# Patient Record
Sex: Female | Born: 1935 | Race: White | Hispanic: No | Smoking: Never smoker
Health system: Southern US, Community
[De-identification: ages and names within clinical notes are randomized; demographics above are authoritative.]

## PROBLEM LIST (undated history)

## (undated) DIAGNOSIS — N189 Chronic kidney disease, unspecified: Secondary | ICD-10-CM

## (undated) DIAGNOSIS — K5730 Diverticulosis of large intestine without perforation or abscess without bleeding: Secondary | ICD-10-CM

## (undated) DIAGNOSIS — I82409 Acute embolism and thrombosis of unspecified deep veins of unspecified lower extremity: Secondary | ICD-10-CM

## (undated) DIAGNOSIS — I83009 Varicose veins of unspecified lower extremity with ulcer of unspecified site: Secondary | ICD-10-CM

## (undated) DIAGNOSIS — M159 Polyosteoarthritis, unspecified: Secondary | ICD-10-CM

## (undated) DIAGNOSIS — I10 Essential (primary) hypertension: Secondary | ICD-10-CM

## (undated) DIAGNOSIS — D649 Anemia, unspecified: Secondary | ICD-10-CM

## (undated) DIAGNOSIS — K219 Gastro-esophageal reflux disease without esophagitis: Secondary | ICD-10-CM

## (undated) DIAGNOSIS — L97909 Non-pressure chronic ulcer of unspecified part of unspecified lower leg with unspecified severity: Secondary | ICD-10-CM

## (undated) HISTORY — DX: Essential (primary) hypertension: I10

## (undated) HISTORY — PX: TOTAL KNEE ARTHROPLASTY: SHX125

## (undated) HISTORY — PX: ABDOMINAL HYSTERECTOMY: SHX81

## (undated) HISTORY — DX: Varicose veins of unspecified lower extremity with ulcer of unspecified site: I83.009

## (undated) HISTORY — DX: Anemia, unspecified: D64.9

## (undated) HISTORY — DX: Acute embolism and thrombosis of unspecified deep veins of unspecified lower extremity: I82.409

## (undated) HISTORY — DX: Polyosteoarthritis, unspecified: M15.9

## (undated) HISTORY — PX: PARTIAL HYSTERECTOMY: SHX80

## (undated) HISTORY — PX: HERNIA REPAIR: SHX51

## (undated) HISTORY — DX: Chronic kidney disease, unspecified: N18.9

## (undated) HISTORY — PX: CHOLECYSTECTOMY OPEN: SUR202

## (undated) HISTORY — DX: Gastro-esophageal reflux disease without esophagitis: K21.9

## (undated) HISTORY — PX: CATARACT EXTRACTION: SUR2

## (undated) HISTORY — DX: Non-pressure chronic ulcer of unspecified part of unspecified lower leg with unspecified severity: L97.909

---

## 1999-02-06 ENCOUNTER — Ambulatory Visit (HOSPITAL_COMMUNITY): Admission: RE | Admit: 1999-02-06 | Discharge: 1999-02-06 | Payer: Self-pay | Admitting: Internal Medicine

## 1999-07-06 ENCOUNTER — Encounter: Admission: RE | Admit: 1999-07-06 | Discharge: 1999-07-06 | Payer: Self-pay | Admitting: *Deleted

## 1999-08-28 ENCOUNTER — Encounter: Payer: Self-pay | Admitting: Orthopedic Surgery

## 1999-08-28 ENCOUNTER — Inpatient Hospital Stay (HOSPITAL_COMMUNITY): Admission: RE | Admit: 1999-08-28 | Discharge: 1999-09-01 | Payer: Self-pay | Admitting: Orthopedic Surgery

## 1999-08-30 ENCOUNTER — Encounter: Payer: Self-pay | Admitting: Orthopedic Surgery

## 1999-09-01 ENCOUNTER — Inpatient Hospital Stay (HOSPITAL_COMMUNITY)
Admission: RE | Admit: 1999-09-01 | Discharge: 1999-09-07 | Payer: Self-pay | Admitting: Physical Medicine & Rehabilitation

## 2003-02-17 ENCOUNTER — Encounter: Admission: RE | Admit: 2003-02-17 | Discharge: 2003-02-17 | Payer: Self-pay | Admitting: General Surgery

## 2003-02-17 ENCOUNTER — Encounter: Payer: Self-pay | Admitting: General Surgery

## 2004-07-14 ENCOUNTER — Encounter
Admission: RE | Admit: 2004-07-14 | Discharge: 2004-07-14 | Payer: Self-pay | Admitting: Physical Medicine and Rehabilitation

## 2004-07-27 ENCOUNTER — Ambulatory Visit: Payer: Self-pay

## 2004-08-07 ENCOUNTER — Ambulatory Visit: Payer: Self-pay | Admitting: Internal Medicine

## 2004-11-07 ENCOUNTER — Ambulatory Visit: Payer: Self-pay | Admitting: Internal Medicine

## 2005-01-19 ENCOUNTER — Encounter
Admission: RE | Admit: 2005-01-19 | Discharge: 2005-01-19 | Payer: Self-pay | Admitting: Physical Medicine and Rehabilitation

## 2005-02-05 ENCOUNTER — Ambulatory Visit: Payer: Self-pay | Admitting: Internal Medicine

## 2005-04-30 ENCOUNTER — Emergency Department (HOSPITAL_COMMUNITY): Admission: EM | Admit: 2005-04-30 | Discharge: 2005-04-30 | Payer: Self-pay | Admitting: Emergency Medicine

## 2005-05-08 ENCOUNTER — Ambulatory Visit: Payer: Self-pay | Admitting: Internal Medicine

## 2005-08-01 ENCOUNTER — Ambulatory Visit (HOSPITAL_COMMUNITY): Admission: RE | Admit: 2005-08-01 | Discharge: 2005-08-01 | Payer: Self-pay | Admitting: Orthopedic Surgery

## 2005-08-31 ENCOUNTER — Ambulatory Visit: Payer: Self-pay | Admitting: Internal Medicine

## 2005-09-21 ENCOUNTER — Ambulatory Visit: Payer: Self-pay | Admitting: Internal Medicine

## 2005-09-28 ENCOUNTER — Ambulatory Visit: Payer: Self-pay | Admitting: Internal Medicine

## 2005-11-12 ENCOUNTER — Ambulatory Visit: Payer: Self-pay | Admitting: Internal Medicine

## 2005-12-10 ENCOUNTER — Ambulatory Visit: Payer: Self-pay | Admitting: Internal Medicine

## 2006-03-18 ENCOUNTER — Ambulatory Visit: Payer: Self-pay | Admitting: Internal Medicine

## 2006-03-19 ENCOUNTER — Ambulatory Visit: Payer: Self-pay

## 2006-03-28 ENCOUNTER — Encounter (HOSPITAL_BASED_OUTPATIENT_CLINIC_OR_DEPARTMENT_OTHER): Admission: RE | Admit: 2006-03-28 | Discharge: 2006-06-26 | Payer: Self-pay | Admitting: Surgery

## 2006-06-18 ENCOUNTER — Ambulatory Visit: Payer: Self-pay | Admitting: Internal Medicine

## 2006-07-31 ENCOUNTER — Ambulatory Visit: Payer: Self-pay | Admitting: Internal Medicine

## 2006-10-02 ENCOUNTER — Ambulatory Visit: Payer: Self-pay | Admitting: Internal Medicine

## 2006-10-02 LAB — CONVERTED CEMR LAB
ALT: 11 units/L (ref 0–40)
AST: 21 units/L (ref 0–37)
Albumin: 3.8 g/dL (ref 3.5–5.2)
Alkaline Phosphatase: 103 units/L (ref 39–117)
Creatinine, Ser: 1.2 mg/dL (ref 0.4–1.2)
GFR calc non Af Amer: 45 mL/min
Glucose, Bld: 89 mg/dL (ref 70–99)
Hemoglobin: 13 g/dL (ref 12.0–15.0)
Sodium: 144 meq/L (ref 135–145)
Total Bilirubin: 1.3 mg/dL — ABNORMAL HIGH (ref 0.3–1.2)

## 2007-01-28 ENCOUNTER — Ambulatory Visit: Payer: Self-pay | Admitting: Internal Medicine

## 2007-04-24 DIAGNOSIS — K219 Gastro-esophageal reflux disease without esophagitis: Secondary | ICD-10-CM

## 2007-04-24 DIAGNOSIS — I10 Essential (primary) hypertension: Secondary | ICD-10-CM | POA: Insufficient documentation

## 2007-04-24 HISTORY — DX: Gastro-esophageal reflux disease without esophagitis: K21.9

## 2007-04-24 HISTORY — DX: Essential (primary) hypertension: I10

## 2007-05-06 ENCOUNTER — Ambulatory Visit: Payer: Self-pay | Admitting: Internal Medicine

## 2007-05-06 DIAGNOSIS — M159 Polyosteoarthritis, unspecified: Secondary | ICD-10-CM

## 2007-05-06 DIAGNOSIS — M1990 Unspecified osteoarthritis, unspecified site: Secondary | ICD-10-CM | POA: Insufficient documentation

## 2007-05-06 HISTORY — DX: Polyosteoarthritis, unspecified: M15.9

## 2007-05-13 ENCOUNTER — Encounter: Payer: Self-pay | Admitting: Internal Medicine

## 2007-08-05 ENCOUNTER — Ambulatory Visit: Payer: Self-pay | Admitting: Internal Medicine

## 2007-09-09 ENCOUNTER — Emergency Department (HOSPITAL_COMMUNITY): Admission: EM | Admit: 2007-09-09 | Discharge: 2007-09-09 | Payer: Self-pay | Admitting: *Deleted

## 2007-12-09 ENCOUNTER — Emergency Department (HOSPITAL_COMMUNITY): Admission: EM | Admit: 2007-12-09 | Discharge: 2007-12-09 | Payer: Self-pay | Admitting: Emergency Medicine

## 2007-12-14 ENCOUNTER — Encounter: Payer: Self-pay | Admitting: Internal Medicine

## 2007-12-15 ENCOUNTER — Ambulatory Visit: Payer: Self-pay | Admitting: Internal Medicine

## 2007-12-15 LAB — CONVERTED CEMR LAB
ALT: 11 units/L (ref 0–35)
Albumin: 3.6 g/dL (ref 3.5–5.2)
BUN: 31 mg/dL — ABNORMAL HIGH (ref 6–23)
Basophils Relative: 1 % (ref 0.0–1.0)
Bilirubin, Direct: 0.2 mg/dL (ref 0.0–0.3)
CO2: 29 meq/L (ref 19–32)
Creatinine, Ser: 1.3 mg/dL — ABNORMAL HIGH (ref 0.4–1.2)
Eosinophils Absolute: 0.3 10*3/uL (ref 0.0–0.6)
GFR calc Af Amer: 50 mL/min
GFR calc non Af Amer: 41 mL/min
HCT: 37.9 % (ref 36.0–46.0)
HDL: 39.6 mg/dL (ref >39.0)
Lymphocytes Relative: 25.7 % (ref 12.0–46.0)
MCHC: 32.4 g/dL (ref 30.0–36.0)
MCV: 92.8 fL (ref 78.0–100.0)
Monocytes Absolute: 0.5 10*3/uL (ref 0.2–0.7)
Monocytes Relative: 9 % (ref 3.0–11.0)
RBC: 4.08 M/uL (ref 3.87–5.11)
RDW: 13.8 % (ref 11.5–14.6)
Sodium: 144 meq/L (ref 135–145)
Total Protein: 6.2 g/dL (ref 6.0–8.3)
Triglycerides: 153 mg/dL — ABNORMAL HIGH (ref 0–149)
VLDL: 31 mg/dL (ref 0–40)
WBC: 5.2 10*3/uL (ref 4.5–10.5)

## 2007-12-23 ENCOUNTER — Telehealth: Payer: Self-pay | Admitting: Internal Medicine

## 2008-01-14 ENCOUNTER — Encounter: Payer: Self-pay | Admitting: Internal Medicine

## 2008-04-20 ENCOUNTER — Ambulatory Visit: Payer: Self-pay | Admitting: Internal Medicine

## 2008-06-08 ENCOUNTER — Encounter: Payer: Self-pay | Admitting: Internal Medicine

## 2008-06-24 ENCOUNTER — Ambulatory Visit: Payer: Self-pay | Admitting: Internal Medicine

## 2008-07-20 ENCOUNTER — Ambulatory Visit: Payer: Self-pay | Admitting: Internal Medicine

## 2008-07-20 DIAGNOSIS — I83009 Varicose veins of unspecified lower extremity with ulcer of unspecified site: Secondary | ICD-10-CM

## 2008-07-20 DIAGNOSIS — L97909 Non-pressure chronic ulcer of unspecified part of unspecified lower leg with unspecified severity: Secondary | ICD-10-CM

## 2008-07-20 HISTORY — DX: Varicose veins of unspecified lower extremity with ulcer of unspecified site: I83.009

## 2008-09-14 ENCOUNTER — Telehealth: Payer: Self-pay | Admitting: Internal Medicine

## 2008-11-16 ENCOUNTER — Ambulatory Visit: Payer: Self-pay | Admitting: Internal Medicine

## 2009-03-15 ENCOUNTER — Ambulatory Visit: Payer: Self-pay | Admitting: Internal Medicine

## 2009-03-15 DIAGNOSIS — R634 Abnormal weight loss: Secondary | ICD-10-CM

## 2009-03-17 LAB — CONVERTED CEMR LAB
AST: 17 units/L (ref 0–37)
Alkaline Phosphatase: 75 units/L (ref 39–117)
Basophils Absolute: 0.1 10*3/uL (ref 0.0–0.1)
Bilirubin, Direct: 0 mg/dL (ref 0.0–0.3)
CO2: 27 meq/L (ref 19–32)
Cholesterol: 161 mg/dL (ref 0–200)
Eosinophils Absolute: 0.4 10*3/uL (ref 0.0–0.7)
Eosinophils Relative: 7.9 % — ABNORMAL HIGH (ref 0.0–5.0)
HCT: 30 % — ABNORMAL LOW (ref 36.0–46.0)
HDL: 41.8 mg/dL (ref >39.00)
Hemoglobin: 10 g/dL — ABNORMAL LOW (ref 12.0–15.0)
LDL Cholesterol: 89 mg/dL (ref 0–99)
Lymphs Abs: 1.3 10*3/uL (ref 0.7–4.0)
MCHC: 33.3 g/dL (ref 30.0–36.0)
Monocytes Absolute: 0.4 10*3/uL (ref 0.1–1.0)
Monocytes Relative: 8 % (ref 3.0–12.0)
Neutro Abs: 3.2 10*3/uL (ref 1.4–7.7)
Neutrophils Relative %: 59.9 % (ref 43.0–77.0)
Platelets: 247 10*3/uL (ref 150.0–400.0)
Potassium: 4.8 meq/L (ref 3.5–5.1)
RBC: 3.57 M/uL — ABNORMAL LOW (ref 3.87–5.11)
Sodium: 147 meq/L — ABNORMAL HIGH (ref 135–145)
TSH: 0.78 microintl units/mL (ref 0.35–5.50)
Total Bilirubin: 1 mg/dL (ref 0.3–1.2)
Total Protein: 6.5 g/dL (ref 6.0–8.3)
VLDL: 30 mg/dL (ref 0.0–40.0)

## 2009-03-30 ENCOUNTER — Encounter (HOSPITAL_BASED_OUTPATIENT_CLINIC_OR_DEPARTMENT_OTHER): Admission: RE | Admit: 2009-03-30 | Discharge: 2009-06-01 | Payer: Self-pay | Admitting: Internal Medicine

## 2009-03-31 ENCOUNTER — Ambulatory Visit: Payer: Self-pay | Admitting: Internal Medicine

## 2009-03-31 DIAGNOSIS — D649 Anemia, unspecified: Secondary | ICD-10-CM

## 2009-03-31 HISTORY — DX: Anemia, unspecified: D64.9

## 2009-03-31 LAB — CONVERTED CEMR LAB
Eosinophils Relative: 7.7 % — ABNORMAL HIGH (ref 0.0–5.0)
Folate: >20 ng/mL
Hemoglobin: 10.4 g/dL — ABNORMAL LOW (ref 12.0–15.0)
Lymphocytes Relative: 20.9 % (ref 12.0–46.0)
Lymphs Abs: 1.2 10*3/uL (ref 0.7–4.0)
MCHC: 33 g/dL (ref 30.0–36.0)
MCV: 84.4 fL (ref 78.0–100.0)
Neutrophils Relative %: 61.2 % (ref 43.0–77.0)
WBC: 5.7 10*3/uL (ref 4.5–10.5)

## 2009-04-05 ENCOUNTER — Encounter: Payer: Self-pay | Admitting: Internal Medicine

## 2009-04-13 ENCOUNTER — Telehealth (INDEPENDENT_AMBULATORY_CARE_PROVIDER_SITE_OTHER): Payer: Self-pay

## 2009-04-27 ENCOUNTER — Encounter: Payer: Self-pay | Admitting: Internal Medicine

## 2009-04-27 ENCOUNTER — Ambulatory Visit: Payer: Self-pay | Admitting: Vascular Surgery

## 2009-04-28 ENCOUNTER — Ambulatory Visit: Payer: Self-pay | Admitting: Internal Medicine

## 2009-05-11 ENCOUNTER — Encounter: Payer: Self-pay | Admitting: Internal Medicine

## 2009-05-11 ENCOUNTER — Ambulatory Visit: Payer: Self-pay | Admitting: Vascular Surgery

## 2009-05-18 ENCOUNTER — Ambulatory Visit: Payer: Self-pay | Admitting: Vascular Surgery

## 2009-07-01 ENCOUNTER — Encounter: Payer: Self-pay | Admitting: Internal Medicine

## 2009-07-01 ENCOUNTER — Ambulatory Visit: Payer: Self-pay | Admitting: Vascular Surgery

## 2009-07-13 ENCOUNTER — Ambulatory Visit: Payer: Self-pay | Admitting: Internal Medicine

## 2009-07-29 ENCOUNTER — Ambulatory Visit: Payer: Self-pay | Admitting: Internal Medicine

## 2009-10-27 ENCOUNTER — Encounter: Payer: Self-pay | Admitting: Internal Medicine

## 2009-10-28 ENCOUNTER — Ambulatory Visit: Payer: Self-pay | Admitting: Internal Medicine

## 2009-10-31 ENCOUNTER — Encounter: Payer: Self-pay | Admitting: Internal Medicine

## 2009-11-07 ENCOUNTER — Encounter: Payer: Self-pay | Admitting: Internal Medicine

## 2010-04-11 ENCOUNTER — Ambulatory Visit: Payer: Self-pay | Admitting: Internal Medicine

## 2010-04-13 LAB — CONVERTED CEMR LAB
ALT: 10 units/L (ref 0–35)
AST: 15 units/L (ref 0–37)
Albumin: 3.6 g/dL (ref 3.5–5.2)
Alkaline Phosphatase: 69 units/L (ref 39–117)
CO2: 28 meq/L (ref 19–32)
Chloride: 112 meq/L (ref 96–112)
Eosinophils Absolute: 0.2 10*3/uL (ref 0.0–0.7)
GFR calc non Af Amer: 46.24 mL/min (ref >60)
Glucose, Bld: 89 mg/dL (ref 70–99)
HCT: 25.8 % — ABNORMAL LOW (ref 36.0–46.0)
Lymphocytes Relative: 21.4 % (ref 12.0–46.0)
Lymphs Abs: 1 10*3/uL (ref 0.7–4.0)
Monocytes Absolute: 0.5 10*3/uL (ref 0.1–1.0)
Monocytes Relative: 9.6 % (ref 3.0–12.0)
Neutro Abs: 3 10*3/uL (ref 1.4–7.7)
Neutrophils Relative %: 63.1 % (ref 43.0–77.0)
Platelets: 284 10*3/uL (ref 150.0–400.0)
Potassium: 5 meq/L (ref 3.5–5.1)
Total Bilirubin: 0.6 mg/dL (ref 0.3–1.2)

## 2010-04-19 ENCOUNTER — Ambulatory Visit: Payer: Self-pay | Admitting: Internal Medicine

## 2010-04-19 LAB — CONVERTED CEMR LAB
Basophils Absolute: 0.1 10*3/uL (ref 0.0–0.1)
Basophils Relative: 1.2 % (ref 0.0–3.0)
Eosinophils Relative: 5.2 % — ABNORMAL HIGH (ref 0.0–5.0)
Ferritin: 10 ng/mL (ref 10.0–291.0)
HCT: 25.6 % — ABNORMAL LOW (ref 36.0–46.0)
Iron: 20 ug/dL — ABNORMAL LOW (ref 42–145)
Lymphocytes Relative: 25.5 % (ref 12.0–46.0)
Lymphs Abs: 1.5 10*3/uL (ref 0.7–4.0)
MCHC: 32.8 g/dL (ref 30.0–36.0)
Monocytes Absolute: 0.5 10*3/uL (ref 0.1–1.0)
Monocytes Relative: 9.2 % (ref 3.0–12.0)
OCCULT 1: NEGATIVE
Platelets: 281 10*3/uL (ref 150.0–400.0)
RBC: 3.33 M/uL — ABNORMAL LOW (ref 3.87–5.11)
Transferrin: 270.1 mg/dL (ref 212.0–360.0)

## 2010-04-20 ENCOUNTER — Inpatient Hospital Stay (HOSPITAL_COMMUNITY): Admission: EM | Admit: 2010-04-20 | Discharge: 2010-05-01 | Payer: Self-pay | Admitting: Emergency Medicine

## 2010-04-20 ENCOUNTER — Ambulatory Visit: Payer: Self-pay | Admitting: Internal Medicine

## 2010-04-20 ENCOUNTER — Telehealth: Payer: Self-pay | Admitting: Internal Medicine

## 2010-04-21 ENCOUNTER — Encounter: Payer: Self-pay | Admitting: Internal Medicine

## 2010-04-24 ENCOUNTER — Encounter (INDEPENDENT_AMBULATORY_CARE_PROVIDER_SITE_OTHER): Payer: Self-pay | Admitting: Internal Medicine

## 2010-04-24 ENCOUNTER — Encounter: Payer: Self-pay | Admitting: Internal Medicine

## 2010-04-24 LAB — HM COLONOSCOPY

## 2010-04-26 ENCOUNTER — Ambulatory Visit: Payer: Self-pay | Admitting: Vascular Surgery

## 2010-04-26 ENCOUNTER — Encounter (INDEPENDENT_AMBULATORY_CARE_PROVIDER_SITE_OTHER): Payer: Self-pay | Admitting: Internal Medicine

## 2010-04-28 ENCOUNTER — Encounter (INDEPENDENT_AMBULATORY_CARE_PROVIDER_SITE_OTHER): Payer: Self-pay | Admitting: Internal Medicine

## 2010-06-12 ENCOUNTER — Telehealth: Payer: Self-pay

## 2010-06-21 ENCOUNTER — Encounter: Payer: Self-pay | Admitting: Internal Medicine

## 2010-06-21 ENCOUNTER — Telehealth: Payer: Self-pay | Admitting: Internal Medicine

## 2010-06-22 ENCOUNTER — Encounter: Payer: Self-pay | Admitting: Internal Medicine

## 2010-06-26 ENCOUNTER — Ambulatory Visit: Payer: Self-pay | Admitting: Internal Medicine

## 2010-06-26 LAB — CONVERTED CEMR LAB
Basophils Absolute: 0 10*3/uL (ref 0.0–0.1)
Basophils Relative: 0.9 % (ref 0.0–3.0)
HCT: 37.4 % (ref 36.0–46.0)
Lymphocytes Relative: 29.3 % (ref 12.0–46.0)
MCV: 86.2 fL (ref 78.0–100.0)
Monocytes Relative: 9.2 % (ref 3.0–12.0)
Neutro Abs: 3.1 10*3/uL (ref 1.4–7.7)
Neutrophils Relative %: 55.2 % (ref 43.0–77.0)
Platelets: 232 10*3/uL (ref 150.0–400.0)

## 2010-07-03 ENCOUNTER — Telehealth: Payer: Self-pay | Admitting: Internal Medicine

## 2010-07-26 ENCOUNTER — Encounter: Payer: Self-pay | Admitting: Internal Medicine

## 2010-10-10 ENCOUNTER — Ambulatory Visit
Admission: RE | Admit: 2010-10-10 | Discharge: 2010-10-10 | Payer: Self-pay | Source: Home / Self Care | Attending: Internal Medicine | Admitting: Internal Medicine

## 2010-10-14 ENCOUNTER — Encounter: Payer: Self-pay | Admitting: Orthopedic Surgery

## 2010-10-16 ENCOUNTER — Encounter: Payer: Self-pay | Admitting: Internal Medicine

## 2010-10-26 NOTE — Assessment & Plan Note (Signed)
Summary: 3 month rov/njr   Vital Signs:  Patient profile:   77 year old female Weight:      125 pounds Temp:     98.5 degrees F oral BP sitting:   160 / 70  (left arm) Cuff size:   regular CC: 3mos rov , no c/o   CC:  3mos rov  and no c/o.  History of Present Illness:  77 year old patient who is seen today for follow-up.  She has hypertension, osteoarthritis, gastroesophageal reflux disease.  She is doing remarkably well.  No concerns or complaints today.has had a history of weight loss, which has been stable.  She also has a history of venous insufficiency and stasis ulcer disease, which has been stable.  She has been placed on a Metrazol for gastroesophageal disease and she is quite pleased with the results.  Allergies: 1)  ! Codeine 2)  ! Ibuprofen 3)  ! Cardizem  Past History:  Past Medical History: Reviewed history from 03/31/2009 and no changes required. GERD Hypertension DJD Peripheral Vascular Occlusive dz history of DVT and pulmonary embolism, 1998 carpal tunnel syndrome unsteady gait chronic venous insufficiency weight loss anemia chronic kidney disease  Review of Systems  The patient denies anorexia, fever, weight loss, weight gain, vision loss, decreased hearing, hoarseness, chest pain, syncope, dyspnea on exertion, peripheral edema, prolonged cough, headaches, hemoptysis, abdominal pain, melena, hematochezia, severe indigestion/heartburn, hematuria, incontinence, genital sores, muscle weakness, suspicious skin lesions, transient blindness, difficulty walking, depression, unusual weight change, abnormal bleeding, enlarged lymph nodes, angioedema, and breast masses.    Physical Exam  General:  elderly frail, alert, no distress.  Blood pressure 150/70 Head:  Normocephalic and atraumatic without obvious abnormalities. No apparent alopecia or balding. Mouth:  Oral mucosa and oropharynx without lesions or exudates.  Neck:  No deformities, masses, or tenderness  noted. Lungs:  Normal respiratory effort, chest expands symmetrically. Lungs are clear to auscultation, no crackles or wheezes. Heart:  Normal rate and regular rhythm. S1 and S2 normal without gallop, murmur, click, rub or other extra sounds. Abdomen:  Bowel sounds positive,abdomen soft and non-tender without masses, organomegaly or hernias noted. Extremities:  No clubbing, cyanosis, edema, or deformity noted with normal full range of motion of all joints.     Impression & Recommendations:  Problem # 1:  HYPERTENSION (ICD-401.9)  Her updated medication list for this problem includes:    Lasix 40 Mg Tabs (Furosemide) .Marland Kitchen... Take 1 tablet by mouth once a day    Accupril 40 Mg Tabs (Quinapril hcl) .Marland Kitchen... Take 1 tablet by mouth once a day  Problem # 2:  DEGENERATIVE JOINT DISEASE, GENERALIZED (ICD-715.00)  Her updated medication list for this problem includes:    Darvocet-n 100 100-650 Mg Tabs (Propoxyphene n-apap) .Marland Kitchen... As needed    Adult Aspirin Ec Low Strength 81 Mg Tbec (Aspirin) .Marland Kitchen... 1 once daily    Tramadol Hcl 50 Mg Tabs (Tramadol hcl) ..... One every 6 hours prn  Complete Medication List: 1)  Lasix 40 Mg Tabs (Furosemide) .... Take 1 tablet by mouth once a day 2)  Accupril 40 Mg Tabs (Quinapril hcl) .... Take 1 tablet by mouth once a day 3)  Vitamin E 400 Unit Caps (Vitamin e) .... Take 1 tablet by mouth once a day 4)  Vitamin B-12 1000 Mcg Tabs (Cyanocobalamin) .... Once daily 5)  Darvocet-n 100 100-650 Mg Tabs (Propoxyphene n-apap) .... As needed 6)  Meclizine Hcl 25 Mg Tabs (Meclizine hcl) .Marland Kitchen.. 1 q6h as needed 7)  Adult Aspirin Ec Low Strength 81 Mg Tbec (Aspirin) .Marland Kitchen.. 1 once daily 8)  Potassium Chloride Crys Cr 20 Meq Cr-tabs (Potassium chloride crys cr) .... One daily 9)  Feosol 200 (65 Fe) Mg Tabs (Ferrous sulfate dried) .Marland Kitchen.. 1 once daily 10)  Tramadol Hcl 50 Mg Tabs (Tramadol hcl) .... One every 6 hours prn 11)  Omeprazole 40 Mg Cpdr (Omeprazole) .... One daily  Patient  Instructions: 1)  Please schedule a follow-up appointment in 6 months. 2)  Limit your Sodium (Salt). 3)  Avoid foods high in acid (tomatoes, citrus juices, spicy foods). Avoid eating within two hours of lying down or before exercising. Do not over eat; try smaller more frequent meals. Elevate head of bed twelve inches when sleeping.

## 2010-10-26 NOTE — Assessment & Plan Note (Signed)
Summary: 6 MONTH ROV/NJR    Vital Signs:  Patient profile:   77 year old female Height:      59.75 inches Weight:      114 pounds Temp:     98.5 degrees F oral BP sitting:   164 / 84  (left arm) Cuff size:   regular  Vitals Entered By: Sid Falcon LPN (October 10, 2010 10:06 AM)  History of Present Illness: 77 year old patient who is seen today for her 6 month follow-up.  He does remarkably well.  She does have chronic constipation and is on the MiraLax daily.  She has hypertension, DJD, and chronic venous insufficiency.  She has osteoarthritis.  She has been stable.  Remains on omeprazole for gastroesophageal reflux disease.  She denies any cardiopulmonary complaints.  Does request a placard for permanent disability.  Allergies: 1)  ! Codeine 2)  ! Ibuprofen 3)  ! Cardizem  Past History:  Past Medical History: Reviewed history from 03/31/2009 and no changes required. GERD Hypertension DJD Peripheral Vascular Occlusive dz history of DVT and pulmonary embolism, 1998 carpal tunnel syndrome unsteady gait chronic venous insufficiency weight loss anemia chronic kidney disease  Past Surgical History: Reviewed history from 04/11/2010 and no changes required. Carpal tunnel release Cataract extraction Hysterectomy complete Total knee replacement x 2 gravida 3, para two, abortus one colonoscopy in February 2003 negative cryolite stress test November 2005 Inguinal herniorrhaphy  Family History: Reviewed history from 04/11/2010 and no changes required. Family History Other cancer-Colon, Breast, Prostate Family History of Stroke M 1st degree relative <50 Fam hx MI Fam hx CAD 4 brothers 3 sisters  Social History: Reviewed history from 12/15/2007 and no changes required. Never Smoked Retired  Review of Systems       The patient complains of weight loss, muscle weakness, and difficulty walking.  The patient denies anorexia, fever, weight gain, vision loss,  decreased hearing, hoarseness, chest pain, syncope, dyspnea on exertion, peripheral edema, prolonged cough, headaches, hemoptysis, abdominal pain, melena, hematochezia, severe indigestion/heartburn, hematuria, incontinence, genital sores, suspicious skin lesions, transient blindness, depression, unusual weight change, abnormal bleeding, enlarged lymph nodes, angioedema, and breast masses.         weight has been stable recently  Physical Exam  General:  elderly frail, alert, no distress.  Repeat blood pressure 140/80 Head:  Normocephalic and atraumatic without obvious abnormalities. No apparent alopecia or balding. Eyes:  No corneal or conjunctival inflammation noted. EOMI. Perrla. Funduscopic exam benign, without hemorrhages, exudates or papilledema. Vision grossly normal. Mouth:  Oral mucosa and oropharynx without lesions or exudates.   Neck:  No deformities, masses, or tenderness noted. Chest Wall:  marked kyphoscoliosis Lungs:  Normal respiratory effort, chest expands symmetrically. Lungs are clear to auscultation, no crackles or wheezes. Heart:  grade 3/6 systolic murmur Abdomen:  Bowel sounds positive,abdomen soft and non-tender without masses, organomegaly or hernias noted. Msk:  No deformity or scoliosis noted of thoracic or lumbar spine.   Extremities:  No clubbing, cyanosis, edema, or deformity noted with normal full range of motion of all joints.   Skin:  Intact without suspicious lesions or rashes Cervical Nodes:  No lymphadenopathy noted Psych:  Cognition and judgment appear intact. Alert and cooperative with normal attention span and concentration. No apparent delusions, illusions, hallucinations   Impression & Recommendations:  Problem # 1:  WEIGHT LOSS, RECENT (ICD-783.21) weight presently is stable and unchanged  Problem # 2:  DEGENERATIVE JOINT DISEASE, GENERALIZED (ICD-715.00)  Her updated medication list for this problem includes:  Adult Aspirin Ec Low Strength 81  Mg Tbec (Aspirin) .Marland Kitchen... 1 once daily    Tramadol Hcl 50 Mg Tabs (Tramadol hcl) ..... One every 6 hours prn will continue same.  Permanent handicap placard completed  Her updated medication list for this problem includes:    Adult Aspirin Ec Low Strength 81 Mg Tbec (Aspirin) .Marland Kitchen... 1 once daily    Tramadol Hcl 50 Mg Tabs (Tramadol hcl) ..... One every 6 hours prn  Problem # 3:  HYPERTENSION (ICD-401.9)  Her updated medication list for this problem includes:    Lasix 40 Mg Tabs (Furosemide) .Marland Kitchen... Take 1 tablet by mouth once a day    Accupril 40 Mg Tabs (Quinapril hcl) .Marland Kitchen... Take 1 tablet by mouth once a day  Her updated medication list for this problem includes:    Lasix 40 Mg Tabs (Furosemide) .Marland Kitchen... Take 1 tablet by mouth once a day    Accupril 40 Mg Tabs (Quinapril hcl) .Marland Kitchen... Take 1 tablet by mouth once a day  Problem # 4:  GERD (ICD-530.81)  Her updated medication list for this problem includes:    Omeprazole 40 Mg Cpdr (Omeprazole) ..... One daily  Her updated medication list for this problem includes:    Omeprazole 40 Mg Cpdr (Omeprazole) ..... One daily  Complete Medication List: 1)  Lasix 40 Mg Tabs (Furosemide) .... Take 1 tablet by mouth once a day 2)  Accupril 40 Mg Tabs (Quinapril hcl) .... Take 1 tablet by mouth once a day 3)  Vitamin E 400 Unit Caps (Vitamin e) .... Take 1 tablet by mouth once a day 4)  Vitamin B-12 1000 Mcg Tabs (Cyanocobalamin) .... Once daily 5)  Meclizine Hcl 25 Mg Tabs (Meclizine hcl) .Marland Kitchen.. 1 q6h as needed 6)  Adult Aspirin Ec Low Strength 81 Mg Tbec (Aspirin) .Marland Kitchen.. 1 once daily 7)  Potassium Chloride Crys Cr 20 Meq Cr-tabs (Potassium chloride crys cr) .... One daily 8)  Tramadol Hcl 50 Mg Tabs (Tramadol hcl) .... One every 6 hours prn 9)  Omeprazole 40 Mg Cpdr (Omeprazole) .... One daily 10)  Dulcolax 5 Mg Tbec (Bisacodyl) .... Qd 11)  Miralax Powd (Polyethylene glycol 3350) .... Bid 12)  Nu-iron 150 Mg Caps (Polysaccharide iron complex) ....  Qd 13)  Polyethylene Glycol 3350-grx Powd (Polyethylene glycol 3350) .... Used twice daily  Patient Instructions: 1)  Please schedule a follow-up appointment in 6 months. 2)  Limit your Sodium (Salt). 3)  It is important that you exercise regularly at least 20 minutes 5 times a week. If you develop chest pain, have severe difficulty breathing, or feel very tired , stop exercising immediately and seek medical attention. 4)  Take calcium +Vitamin D daily. Prescriptions: MIRALAX  POWD (POLYETHYLENE GLYCOL 3350) bid  #carton x 6   Entered and Authorized by:   Gordy Savers  MD   Signed by:   Gordy Savers  MD on 10/10/2010   Method used:   Print then Give to Patient   RxID:   [id number] OMEPRAZOLE 40 MG CPDR (OMEPRAZOLE) one daily  #90 x 6   Entered and Authorized by:   Gordy Savers  MD   Signed by:   Gordy Savers  MD on 10/10/2010   Method used:   Print then Give to Patient   RxID:   [id number] TRAMADOL HCL 50 MG TABS (TRAMADOL HCL) one every 6 hours prn  #90 x 6   Entered and Authorized by:   Gordy Savers  MD   Signed by:   Gordy Savers  MD on 10/10/2010   Method used:   Print then Give to Patient   RxID:   [id number] POTASSIUM CHLORIDE CRYS CR 20 MEQ CR-TABS (POTASSIUM CHLORIDE CRYS CR) one daily  #90 x 6   Entered and Authorized by:   Gordy Savers  MD   Signed by:   Gordy Savers  MD on 10/10/2010   Method used:   Print then Give to Patient   RxID:   [id number] ACCUPRIL 40 MG  TABS (QUINAPRIL HCL) Take 1 tablet by mouth once a day  #90 x 6   Entered and Authorized by:   Gordy Savers  MD   Signed by:   Gordy Savers  MD on 10/10/2010   Method used:   Print then Give to Patient   RxID:   [id number] MECLIZINE HCL 25 MG  TABS (MECLIZINE HCL) 1 q6h as needed  #90 x 6   Entered and Authorized by:   Gordy Savers  MD   Signed by:   Gordy Savers  MD on 10/10/2010   Method used:    Print then Give to Patient   RxID:   [id number] LASIX 40 MG  TABS (FUROSEMIDE) Take 1 tablet by mouth once a day  #90 x 6   Entered and Authorized by:   Gordy Savers  MD   Signed by:   Gordy Savers  MD on 10/10/2010   Method used:   Print then Give to Patient   RxID:   [id number] MECLIZINE HCL 25 MG  TABS (MECLIZINE HCL) 1 q6h as needed  #90 x 6   Entered and Authorized by:   Gordy Savers  MD   Signed by:   Gordy Savers  MD on 10/10/2010   Method used:   Electronically to        [facility] [phone]* (retail)       [address] [place]  [address]       Ph: [phone] or [phone]       Fax: [phone]   RxID:   [id number]    Orders Added: 1)  Est. Patient Level IV [27035]

## 2010-10-26 NOTE — Procedures (Signed)
Summary: Colonoscopy  Patient: Erin Gibson Note: All result statuses are Final unless otherwise noted.  Tests: (1) Colonoscopy (COL)   COL Colonoscopy           DONE     [facility]     [address] [place], [place]  [address]           COLONOSCOPY PROCEDURE REPORT           PATIENT:  Erin, Gibson  MR#:  [id number]     BIRTHDATE:  01-13-1919, 89 yrs. old  GENDER:  female     ENDOSCOPIST:  Wilhemina Bonito. Eda Keys, MD     REF. BY:  [facility],     PROCEDURE DATE:  04/21/2010     PROCEDURE:  Diagnostic Colonoscopy to transverse colon     ASA CLASS:  Class II     INDICATIONS:  Abnormal CT of abdomen     MEDICATIONS:   Fentanyl 50 mcg IV, Versed 5 mg IV           DESCRIPTION OF PROCEDURE:   After the risks benefits and     alternatives of the procedure were thoroughly explained, informed     consent was obtained.  Digital rectal exam was performed and     revealed no abnormalities.   The Pentax Colonoscope [id number]     endoscope was introduced through the anus and advanced to the mid     transverse colon, limited by suboptimal preparation.    The     quality of the prep was adequate, using Tap water Enemas.  The     instrument was then slowly withdrawn as the colon was fully     examined.     <<PROCEDUREIMAGES>>           FINDINGS:  Moderate diverticulosis was found in the sigmoid colon     with stenosis. The scope was advanced to mid transverse colon (too     much stool beyond). No polyps or cancers were seen.   Retroflexed     views in the rectum revealed internal hemorrhoids.    The scope     was then withdrawn from the patient and the procedure completed.           COMPLICATIONS:  None     ENDOSCOPIC IMPRESSION:     1) Moderate diverticulosis in the sigmoid colon     2) No polyps or cancers     3) Internal hemorrhoids     4) Exam to transverse colon     RECOMMENDATIONS:     1) Repeat Colonoscopy and upper endoscopy, prior to D/C, when     able to  prep... this to evaluate iron deficiency anemia and weight     loss     ______________________________     Wilhemina Bonito. Eda Keys, MD           CC:  Stan Head, MD;Peter Lysle Dingwall, MD;The Patient           n.     Rosalie DoctorWilhemina Bonito. Eda Keys at 04/21/2010 02:00 PM           Erin Gibson, [id number]  Note: An exclamation mark (!) indicates a result that was not dispersed into the flowsheet. Document Creation Date: 04/21/2010 2:01 PM _______________________________________________________________________  (1) Order result status: Final Collection or observation date-time: 04/21/2010 13:49 Requested date-time:  Receipt date-time:  Reported date-time:  Referring Physician:  Ordering Physician: Fransico Setters [phone]) Specimen Source:  Source: Launa Grill Order Number: [id number] Lab site:

## 2010-10-26 NOTE — Progress Notes (Signed)
Summary: GI bleeding?????  Phone Note Call from Patient   Summary of Call: Pt is home alone, has severe, diffuse abdominal pain, coffee grounds when vomited this am and dizzy.  Call EMS ASAP, and will call a neighbor to stay with her until she can be picked up.  Hgb was 8 gms yesterday. Called pt back and EMS is in route to pick her up. Initial call taken by: Lynann Beaver CMA,  April 20, 2010 12:32 PM  Follow-up for Phone Call        noted Follow-up by: Gordy Savers  MD,  April 20, 2010 1:39 PM

## 2010-10-26 NOTE — Miscellaneous (Signed)
Summary: directions for miralax  Clinical Lists Changes  Medications: Changed medication from POLYETHYLENE GLYCOL 3350-GRX  POWD (POLYETHYLENE GLYCOL 3350) used twice daily to POLYETHYLENE GLYCOL 3350-GRX  POWD (POLYETHYLENE GLYCOL 3350) 17gms of powder two times a day mixed with 8 oz of water - Signed Rx of POLYETHYLENE GLYCOL 3350-GRX  POWD (POLYETHYLENE GLYCOL 3350) 17gms of powder two times a day mixed with 8 oz of water;  #90 day x 3;  Signed;  Entered by: Duard Brady LPN;  Authorized by: Gordy Savers  MD;  Method used: Historical    Prescriptions: POLYETHYLENE GLYCOL 3350-GRX  POWD (POLYETHYLENE GLYCOL 3350) 17gms of powder two times a day mixed with 8 oz of water  #90 day x 3   Entered by:   Duard Brady LPN   Authorized by:   Gordy Savers  MD   Signed by:   Duard Brady LPN on 16/06/9603   Method used:   Historical   RxID:   [id number]  fax direction clarification [facility]. KIK

## 2010-10-26 NOTE — Procedures (Signed)
Summary: Colonoscopy  Patient: Erin Gibson Note: All result statuses are Final unless otherwise noted.  Tests: (1) Colonoscopy (COL)   COL Colonoscopy           DONE     [facility]     [address] [place], [place]  [address]           COLONOSCOPY PROCEDURE REPORT           PATIENT:  Erin, Gibson  MR#:  [id number]     BIRTHDATE:  [birth year], 89 yrs. old  GENDER:  female     ENDOSCOPIST:  Hedwig Morton. Juanda Chance, MD     REF. BY:  Lonia Blood, M.D.     PROCEDURE DATE:  04/24/2010     PROCEDURE:  Colonoscopy 30865     ASA CLASS:  Class III     INDICATIONS:  Anemia, Abdominal pain, Abnormal CT of abdomen last     colon 2003     MEDICATIONS:   Versed 1.5 mg, Fentanyl 25 mcg           DESCRIPTION OF PROCEDURE:   After the risks benefits and     alternatives of the procedure were thoroughly explained, informed     consent was obtained.  Digital rectal exam was performed and     revealed no rectal masses.   The Pentax Ped Colon [id number]     endoscope was introduced through the anus and advanced to the     cecum, which was identified by both the appendix and ileocecal     valve, without limitations.  The quality of the prep was good,     using MiraLax.  The instrument was then slowly withdrawn as the     colon was fully examined.     <<PROCEDUREIMAGES>>           FINDINGS:  Moderate diverticulosis was found (see image1).     tortuous slightly narrow lumen of the sigmoid colon, pediatric     scope passed without difficulty,nothing to explain anemia  This     was otherwise a normal examination of the colon (see image2,     image3, image4, and image5).   Retroflexed views in the rectum     revealed no abnormalities.    The scope was then withdrawn from     the patient and the procedure completed.           COMPLICATIONS:  None     ENDOSCOPIC IMPRESSION:     1) Moderate diverticulosis     2) Otherwise normal examination     nothing to account for pt's symptoms, no significant  obstruction     in the sigmoid colon, ?? functional constipation?     RECOMMENDATIONS:     SBFT to assess for partial SBO,     D/C NG tube,     advance diet,     evaluate anemia     REPEAT EXAM:  In 0 year(s) for.  no recall due to age           ______________________________     Hedwig Morton. Juanda Chance, MD           CC:           n.     eSIGNED:   Hedwig Morton. Johathon Overturf at 04/24/2010 11:47 AM           Erin Gibson, [id number]  Note: An exclamation mark (!) indicates a result that  was not dispersed into the flowsheet. Document Creation Date: 04/24/2010 11:47 AM _______________________________________________________________________  (1) Order result status: Final Collection or observation date-time: 04/24/2010 11:16 Requested date-time:  Receipt date-time:  Reported date-time:  Referring Physician:   Ordering Physician: Lina Sar [phone]) Specimen Source:  Source: Launa Grill Order Number: [id number] Lab site:

## 2010-10-26 NOTE — Letter (Signed)
Summary: [facility]   Imported By: Maryln Gottron 06/28/2010 12:47:53  _____________________________________________________________________  External Attachment:    Type:   Image     Comment:   External Document

## 2010-10-26 NOTE — Progress Notes (Signed)
Summary: [facility] has questions re: pts meds  Phone Note From Other Clinic Call back at [phone] [facility]  - Tonya   Caller: [facility]   - Kathe Becton Summary of Call: [facility] went to visit pt and they have questions re: some of the pts meds.  Pls Call.  Initial call taken by: Lucy Antigua,  June 12, 2010 2:10 PM  Follow-up for Phone Call        attempt to call - ans mach - LMTCB if question - have not seen since middle of july and not since dc'd from hospital. Not sure of meds other than what would be listed on DC summary. KIK Follow-up by: Duard Brady LPN,  June 14, 2010 8:52 AM

## 2010-10-26 NOTE — Progress Notes (Signed)
Summary: faxing about meds  Phone Note From Other Clinic   Caller: kelly,[facility] Summary of Call: Will fax note tomorrow to reconcile med list, ask questions about 3 meds she was put on in rehab. Initial call taken by: Rudy Jew, RN,  June 21, 2010 4:36 PM  Follow-up for Phone Call        ok Follow-up by: Gordy Savers  MD,  June 22, 2010 10:53 AM

## 2010-10-26 NOTE — Assessment & Plan Note (Signed)
Summary: 1 week follow up/per Kim/cjr  CC: f/u on labs - brought in hemoccult x3 neg.  Is Patient Diabetic? No   CC:  f/u on labs - brought in hemoccult x3 neg. Marland Kitchen  History of Present Illness: 77 year old patient who is seen today for follow-up of her anemia.  She does have some chronic kidney disease, and history of prior anemia.  She has had colonoscopy in the past, but none recently.  That has revealed diverticular disease only.  There is been no melena, hematochezia, or change in her bowel habits.  She basically feels well.  Anemia evaluation in the past have revealed borderline low iron levels and normal B12 levels.  She is on chronic omeprazole for gastroesophageal reflux disease.  She has returned 3 Hemoccult slides that all are negative for occult blood.  She feels well.  Denies any abdominal pain or weight loss.  She does have arthritis, which is treated with Tylenol and Tylenol only  Allergies: 1)  ! Codeine 2)  ! Ibuprofen 3)  ! Cardizem  Past History:  Past Medical History: Reviewed history from 03/31/2009 and no changes required. GERD Hypertension DJD Peripheral Vascular Occlusive dz history of DVT and pulmonary embolism, 1998 carpal tunnel syndrome unsteady gait chronic venous insufficiency weight loss anemia chronic kidney disease  Review of Systems  The patient denies anorexia, fever, weight loss, weight gain, vision loss, decreased hearing, hoarseness, chest pain, syncope, dyspnea on exertion, peripheral edema, prolonged cough, headaches, hemoptysis, abdominal pain, melena, hematochezia, severe indigestion/heartburn, hematuria, incontinence, genital sores, muscle weakness, suspicious skin lesions, transient blindness, difficulty walking, depression, unusual weight change, abnormal bleeding, enlarged lymph nodes, angioedema, and breast masses.    Physical Exam  General:  Well-developed,well-nourished,in no acute distress; alert,appropriate and cooperative  throughout examination Head:  Normocephalic and atraumatic without obvious abnormalities. No apparent alopecia or balding. Eyes:  No corneal or conjunctival inflammation noted. EOMI. Perrla. Funduscopic exam benign, without hemorrhages, exudates or papilledema. Vision grossly normal. Mouth:  Oral mucosa and oropharynx without lesions or exudates.  Teeth in good repair. Neck:  No deformities, masses, or tenderness noted. Lungs:  Normal respiratory effort, chest expands symmetrically. Lungs are clear to auscultation, no crackles or wheezes. Heart:  Normal rate and regular rhythm. S1 and S2 normal without gallop, murmur, click, rub or other extra sounds. Abdomen:  Bowel sounds positive,abdomen soft and non-tender without masses, organomegaly or hernias noted. Msk:  No deformity or scoliosis noted of thoracic or lumbar spine.   Extremities:  No clubbing, cyanosis, edema, or deformity noted with normal full range of motion of all joints.     Impression & Recommendations:  Problem # 1:  ANEMIA (ICD-285.9)  Her updated medication list for this problem includes:    Vitamin B-12 1000 Mcg Tabs (Cyanocobalamin) ..... Once daily  Orders: Hemoccult Cards -3 specimans (take home) (16109) Venipuncture (60454) TLB-CBC Platelet - w/Differential (85025-CBCD) TLB-IBC Pnl (Iron/FE;Transferrin) (83550-IBC) TLB-Ferritin (82728-FER)  Problem # 2:  HYPERTENSION (ICD-401.9)  Her updated medication list for this problem includes:    Lasix 40 Mg Tabs (Furosemide) .Marland Kitchen... Take 1 tablet by mouth once a day    Accupril 40 Mg Tabs (Quinapril hcl) .Marland Kitchen... Take 1 tablet by mouth once a day  Complete Medication List: 1)  Lasix 40 Mg Tabs (Furosemide) .... Take 1 tablet by mouth once a day 2)  Accupril 40 Mg Tabs (Quinapril hcl) .... Take 1 tablet by mouth once a day 3)  Vitamin E 400 Unit Caps (Vitamin e) .... Take  1 tablet by mouth once a day 4)  Vitamin B-12 1000 Mcg Tabs (Cyanocobalamin) .... Once daily 5)   Meclizine Hcl 25 Mg Tabs (Meclizine hcl) .Marland Kitchen.. 1 q6h as needed 6)  Adult Aspirin Ec Low Strength 81 Mg Tbec (Aspirin) .Marland Kitchen.. 1 once daily 7)  Potassium Chloride Crys Cr 20 Meq Cr-tabs (Potassium chloride crys cr) .... One daily 8)  Tramadol Hcl 50 Mg Tabs (Tramadol hcl) .... One every 6 hours prn 9)  Omeprazole 40 Mg Cpdr (Omeprazole) .... One daily  Patient Instructions: 1)  Please schedule a follow-up appointment in 6 weeks 2)   take iron  tablets twice daily  Laboratory Results    Stool - Occult Blood Hemmoccult #1: negative Date: 04/19/2010 Hemoccult #2: negative Date: 04/19/2010 Hemoccult #3: negative Date: 04/19/2010

## 2010-10-26 NOTE — Progress Notes (Signed)
Summary: lab results  Phone Note Call from Patient Call back at Home Phone [phone]   Reason for Call: Lab or Test Results Summary of Call: lab from last week.   Initial call taken by: Rudy Jew, RN,  July 03, 2010 4:51 PM  Follow-up for Phone Call        called pt to discussed labs. KIK Follow-up by: Duard Brady LPN,  July 03, 2010 4:57 PM

## 2010-10-26 NOTE — Miscellaneous (Signed)
Summary: Professional Communication/[facility]  Professional Communication/[facility]   Imported By: Maryln Gottron 06/28/2010 12:50:00  _____________________________________________________________________  External Attachment:    Type:   Image     Comment:   External Document

## 2010-10-26 NOTE — Assessment & Plan Note (Signed)
Summary: PT WILL COME IN FASTING/[facility] bmp/njr   Vital Signs:  Patient profile:   77 year old female Height:      60 inches Weight:      121 pounds BMI:     23.72 Temp:     98.5 degrees F oral BP sitting:   114 / 70  (right arm) Cuff size:   regular  Vitals Entered By: Duard Brady LPN (April 11, 2010 9:34 AM) CC: cpx - doing well Is Patient Diabetic? No   CC:  cpx - doing well.  History of Present Illness: 77 year old patient who is seen today for a comprehensive evaluation.  Medical problems include hypertension, osteoarthritis, and gastroesophageal reflux disease.  She does remarkably well at 89.  She has a history of peripheral vascular occlusive disease.  She denies any cardiopulmonary complaints Here for Medicare AWV:  1.   Risk factors based on Past M, S, F history: personal risk factors include peripheral vascular disease, hypertension, and age 3.   Physical Activities: fairly sedentary but remains independent 3.   Depression/mood: no history of depression 4.   Hearing: on a mild hearing impairment 5.   ADL's: remains independent in aspects of daily living 6.   Fall Risk: moderate fall risk due to age 1.   Home Safety: no problems identified 8.   Height, weight, &visual acuity: patient has lost 7 inches in height compared to her maximal height.  Has had bilateral cataract extraction surgeries with lens implantation 9.   Counseling: follow mammogram scheduled next month 10.   Labs ordered based on risk factors: laboratory profile will be reviewed 11.           Referral Coordination in-none appropriate except for follow-up mammogram 12.           Care Plan- heart healthy diet.  Discussed 13.            Cognitive Assessment-alert and oriented, with normal affect   Allergies: 1)  ! Codeine 2)  ! Ibuprofen 3)  ! Cardizem  Past History:  Past Medical History: Reviewed history from 03/31/2009 and no changes required. GERD Hypertension DJD Peripheral Vascular  Occlusive dz history of DVT and pulmonary embolism, 1998 carpal tunnel syndrome unsteady gait chronic venous insufficiency weight loss anemia chronic kidney disease  Past Surgical History: Carpal tunnel release Cataract extraction Hysterectomy complete Total knee replacement x 2 gravida 3, para two, abortus one colonoscopy in February 2003 negative cryolite stress test November 2005 Inguinal herniorrhaphy  Family History: Reviewed history from 04/24/2007 and no changes required. Family History Other cancer-Colon, Breast, Prostate Family History of Stroke M 1st degree relative <50 Fam hx MI Fam hx CAD 4 brothers 3 sisters  Social History: Reviewed history from 12/15/2007 and no changes required. Never Smoked Retired  Review of Systems       The patient complains of muscle weakness and difficulty walking.  The patient denies anorexia, fever, weight loss, weight gain, vision loss, decreased hearing, hoarseness, chest pain, syncope, dyspnea on exertion, peripheral edema, prolonged cough, headaches, hemoptysis, abdominal pain, melena, hematochezia, severe indigestion/heartburn, hematuria, incontinence, genital sores, suspicious skin lesions, transient blindness, depression, unusual weight change, abnormal bleeding, enlarged lymph nodes, angioedema, and breast masses.    Physical Exam  General:  elderly frail, no acute distress.  Blood pressure 140/70 Head:  Normocephalic and atraumatic without obvious abnormalities. No apparent alopecia or balding. Eyes:  No corneal or conjunctival inflammation noted. EOMI. Perrla. Funduscopic exam benign, without hemorrhages,  exudates or papilledema. Vision grossly normal. Ears:  External ear exam shows no significant lesions or deformities.  Otoscopic examination reveals clear canals, tympanic membranes are intact bilaterally without bulging, retraction, inflammation or discharge. Hearing is grossly normal bilaterally. Nose:  External nasal  examination shows no deformity or inflammation. Nasal mucosa are pink and moist without lesions or exudates. Mouth:  Oral mucosa and oropharynx without lesions or exudates.  Teeth in good repair. Neck:  bilateral soft carotid bruits Chest Wall:  No deformities, masses, or tenderness noted. Breasts:  No mass, nodules, thickening, tenderness, bulging, retraction, inflamation, nipple discharge or skin changes noted.   Lungs:  Normal respiratory effort, chest expands symmetrically. Lungs are clear to auscultation, no crackles or wheezes. Heart:  Normal rate and regular rhythm. S1 and S2 normal without gallop, murmur, click, rub or other extra sounds. Abdomen:  Bowel sounds positive,abdomen soft and non-tender without masses, organomegaly or hernias noted. surgical scars noted in the midline and lower quadrants Rectal:  No external abnormalities noted. Normal sphincter tone. No rectal masses or tenderness. Genitalia:  status post hysterectomy atrophic changes only Msk:  No deformity or scoliosis noted of thoracic or lumbar spine.   Pulses:  pedal pulses absent Extremities:  toes the right foot is slightly cooler compared to the left Neurologic:  diminished vibratory sensation, and monofilament testing involving the feet Skin:  Intact without suspicious lesions or rashes Cervical Nodes:  No lymphadenopathy noted Axillary Nodes:  No palpable lymphadenopathy Inguinal Nodes:  No significant adenopathy Psych:  Cognition and judgment appear intact. Alert and cooperative with normal attention span and concentration. No apparent delusions, illusions, hallucinations   Impression & Recommendations:  Problem # 1:  PREVENTIVE HEALTH CARE (ICD-V70.0)  Orders: First annual wellness visit with prevention plan  (U0454)  Problem # 2:  DEGENERATIVE JOINT DISEASE, GENERALIZED (ICD-715.00)  The following medications were removed from the medication list:    Darvocet-n 100 100-650 Mg Tabs (Propoxyphene n-apap)  .Marland Kitchen... As needed Her updated medication list for this problem includes:    Adult Aspirin Ec Low Strength 81 Mg Tbec (Aspirin) .Marland Kitchen... 1 once daily    Tramadol Hcl 50 Mg Tabs (Tramadol hcl) ..... One every 6 hours prn  The following medications were removed from the medication list:    Darvocet-n 100 100-650 Mg Tabs (Propoxyphene n-apap) .Marland Kitchen... As needed Her updated medication list for this problem includes:    Adult Aspirin Ec Low Strength 81 Mg Tbec (Aspirin) .Marland Kitchen... 1 once daily    Tramadol Hcl 50 Mg Tabs (Tramadol hcl) ..... One every 6 hours prn  Orders: Venipuncture (09811) TLB-BMP (Basic Metabolic Panel-BMET) (80048-METABOL) TLB-CBC Platelet - w/Differential (85025-CBCD) TLB-Hepatic/Liver Function Pnl (80076-HEPATIC) TLB-TSH (Thyroid Stimulating Hormone) (84443-TSH)  Problem # 3:  HYPERTENSION (ICD-401.9)  Her updated medication list for this problem includes:    Lasix 40 Mg Tabs (Furosemide) .Marland Kitchen... Take 1 tablet by mouth once a day    Accupril 40 Mg Tabs (Quinapril hcl) .Marland Kitchen... Take 1 tablet by mouth once a day  Orders: EKG w/ Interpretation (93000) Venipuncture (91478) TLB-BMP (Basic Metabolic Panel-BMET) (80048-METABOL) TLB-CBC Platelet - w/Differential (85025-CBCD) TLB-Hepatic/Liver Function Pnl (80076-HEPATIC) TLB-TSH (Thyroid Stimulating Hormone) (84443-TSH)  Her updated medication list for this problem includes:    Lasix 40 Mg Tabs (Furosemide) .Marland Kitchen... Take 1 tablet by mouth once a day    Accupril 40 Mg Tabs (Quinapril hcl) .Marland Kitchen... Take 1 tablet by mouth once a day  Problem # 4:  GERD (ICD-530.81)  Her updated medication list for  this problem includes:    Omeprazole 40 Mg Cpdr (Omeprazole) ..... One daily  Her updated medication list for this problem includes:    Omeprazole 40 Mg Cpdr (Omeprazole) ..... One daily  Orders: Venipuncture (04540) TLB-BMP (Basic Metabolic Panel-BMET) (80048-METABOL) TLB-CBC Platelet - w/Differential (85025-CBCD) TLB-Hepatic/Liver Function  Pnl (80076-HEPATIC) TLB-TSH (Thyroid Stimulating Hormone) (84443-TSH)  Complete Medication List: 1)  Lasix 40 Mg Tabs (Furosemide) .... Take 1 tablet by mouth once a day 2)  Accupril 40 Mg Tabs (Quinapril hcl) .... Take 1 tablet by mouth once a day 3)  Vitamin E 400 Unit Caps (Vitamin e) .... Take 1 tablet by mouth once a day 4)  Vitamin B-12 1000 Mcg Tabs (Cyanocobalamin) .... Once daily 5)  Meclizine Hcl 25 Mg Tabs (Meclizine hcl) .Marland Kitchen.. 1 q6h as needed 6)  Adult Aspirin Ec Low Strength 81 Mg Tbec (Aspirin) .Marland Kitchen.. 1 once daily 7)  Potassium Chloride Crys Cr 20 Meq Cr-tabs (Potassium chloride crys cr) .... One daily 8)  Tramadol Hcl 50 Mg Tabs (Tramadol hcl) .... One every 6 hours prn 9)  Omeprazole 40 Mg Cpdr (Omeprazole) .... One daily  Patient Instructions: 1)  Please schedule a follow-up appointment in 6 months. 2)  Limit your Sodium (Salt). 3)  It is important that you exercise regularly at least 20 minutes 5 times a week. If you develop chest pain, have severe difficulty breathing, or feel very tired , stop exercising immediately and seek medical attention. 4)  Take calcium +Vitamin D daily. Prescriptions: TRAMADOL HCL 50 MG TABS (TRAMADOL HCL) one every 6 hours prn  #90 x 6   Entered and Authorized by:   Gordy Savers  MD   Signed by:   Gordy Savers  MD on 04/11/2010   Method used:   Faxed to ...       [facility] (mail-order)             , [place]         Ph: [phone]       Fax: [phone]   RxID:   [id number] OMEPRAZOLE 40 MG CPDR (OMEPRAZOLE) one daily  #90 x 6   Entered and Authorized by:   Gordy Savers  MD   Signed by:   Gordy Savers  MD on 04/11/2010   Method used:   Faxed to ...       [facility] (mail-order)             , [place]         Ph: [phone]       Fax: [phone]   RxID:   [id number] POTASSIUM CHLORIDE CRYS CR 20 MEQ CR-TABS (POTASSIUM CHLORIDE CRYS CR) one daily  #90 x 6   Entered and Authorized by:   Gordy Savers  MD    Signed by:   Gordy Savers  MD on 04/11/2010   Method used:   Faxed to ...       [facility] (mail-order)             , [place]         Ph: [phone]       Fax: [phone]   RxID:   [id number] ACCUPRIL 40 MG  TABS (QUINAPRIL HCL) Take 1 tablet by mouth once a day  #90 x 6   Entered and Authorized by:   Gordy Savers  MD   Signed by:   Gordy Savers  MD on 04/11/2010   Method used:  Faxed to ...       [facility] (mail-order)             , [place]         Ph: [phone]       Fax: [phone]   RxID:   [id number] LASIX 40 MG  TABS (FUROSEMIDE) Take 1 tablet by mouth once a day  #90 x 6   Entered and Authorized by:   Gordy Savers  MD   Signed by:   Gordy Savers  MD on 04/11/2010   Method used:   Faxed to ...       [facility] (mail-order)             , [place]         Ph: [phone]       Fax: [phone]   RxID:   [id number]

## 2010-10-26 NOTE — Assessment & Plan Note (Signed)
Summary: FOLLOW UP FROM REHAB/[facility]   Vital Signs:  Patient profile:   77 year old female Weight:      114 pounds Temp:     97.5 degrees F oral BP sitting:   140 / 70  (right arm) Cuff size:   regular  Vitals Entered By: Duard Brady LPN (June 26, 2010 11:25 AM) CC: f/u past rehab      Is Patient Diabetic? No   CC:  f/u past rehab     .  History of Present Illness:  77 year old patient who is seen today in follow-up.  She was hospitalized recently due to a small bowel junction and transferred to a rehab facility.  She has been home for approximately 2 weeks and receives assistance from her daughter.  She has done well since her discharge.  Hospital records reviewed.  She has a history of hypertension, gastroesophageal reflux disease, osteoarthritis.  She is still using a 4 point walker but  improving daily .She has a history of anemia and was discharged the hospital on iron supplementation.  Her arthritis has been fairly stable.  Allergies: 1)  ! Codeine 2)  ! Ibuprofen 3)  ! Cardizem  Past History:  Past Medical History: Reviewed history from 03/31/2009 and no changes required. GERD Hypertension DJD Peripheral Vascular Occlusive dz history of DVT and pulmonary embolism, 1998 carpal tunnel syndrome unsteady gait chronic venous insufficiency weight loss anemia chronic kidney disease  Past Surgical History: Reviewed history from 04/11/2010 and no changes required. Carpal tunnel release Cataract extraction Hysterectomy complete Total knee replacement x 2 gravida 3, para two, abortus one colonoscopy in February 2003 negative cryolite stress test November 2005 Inguinal herniorrhaphy  Review of Systems       The patient complains of muscle weakness and difficulty walking.  The patient denies anorexia, fever, weight loss, weight gain, vision loss, decreased hearing, hoarseness, chest pain, syncope, dyspnea on exertion, peripheral edema, prolonged cough,  headaches, hemoptysis, abdominal pain, melena, hematochezia, severe indigestion/heartburn, hematuria, incontinence, genital sores, suspicious skin lesions, transient blindness, depression, unusual weight change, abnormal bleeding, enlarged lymph nodes, angioedema, and breast masses.    Physical Exam  General:  elderly frail, no distress.  Blood pressure normal Head:  Normocephalic and atraumatic without obvious abnormalities. No apparent alopecia or balding. Eyes:  No corneal or conjunctival inflammation noted. EOMI. Perrla. Funduscopic exam benign, without hemorrhages, exudates or papilledema. Vision grossly normal. Ears:  External ear exam shows no significant lesions or deformities.  Otoscopic examination reveals clear canals, tympanic membranes are intact bilaterally without bulging, retraction, inflammation or discharge. Hearing is grossly normal bilaterally. Mouth:  Oral mucosa and oropharynx without lesions or exudates.  Teeth in good repair. Neck:  No deformities, masses, or tenderness noted. Lungs:  Normal respiratory effort, chest expands symmetrically. Lungs are clear to auscultation, no crackles or wheezes. Heart:  Normal rate and regular rhythm. S1 and S2 normal without gallop, murmur, click, rub or other extra sounds. Abdomen:  Bowel sounds positive,abdomen soft and non-tender without masses, organomegaly or hernias noted. Msk:  No deformity or scoliosis noted of thoracic or lumbar spine.   Extremities:  No clubbing, cyanosis, edema, or deformity noted with normal full range of motion of all joints.   Skin:  Intact without suspicious lesions or rashes Cervical Nodes:  No lymphadenopathy noted Axillary Nodes:  No palpable lymphadenopathy Psych:  Cognition and judgment appear intact. Alert and cooperative with normal attention span and concentration. No apparent delusions, illusions, hallucinations   Impression &  Recommendations:  Problem # 1:  ANEMIA (ICD-285.9)  Her updated  medication list for this problem includes:    Vitamin B-12 1000 Mcg Tabs (Cyanocobalamin) ..... Once daily    Nu-iron 150 Mg Caps (Polysaccharide iron complex) ..... Qd  Orders: Venipuncture (16109) TLB-CBC Platelet - w/Differential (85025-CBCD) Specimen Handling (60454)  Problem # 2:  DEGENERATIVE JOINT DISEASE, GENERALIZED (ICD-715.00)  Her updated medication list for this problem includes:    Adult Aspirin Ec Low Strength 81 Mg Tbec (Aspirin) .Marland Kitchen... 1 once daily    Tramadol Hcl 50 Mg Tabs (Tramadol hcl) ..... One every 6 hours prn  Problem # 3:  HYPERTENSION (ICD-401.9)  Her updated medication list for this problem includes:    Lasix 40 Mg Tabs (Furosemide) .Marland Kitchen... Take 1 tablet by mouth once a day    Accupril 40 Mg Tabs (Quinapril hcl) .Marland Kitchen... Take 1 tablet by mouth once a day  Complete Medication List: 1)  Lasix 40 Mg Tabs (Furosemide) .... Take 1 tablet by mouth once a day 2)  Accupril 40 Mg Tabs (Quinapril hcl) .... Take 1 tablet by mouth once a day 3)  Vitamin E 400 Unit Caps (Vitamin e) .... Take 1 tablet by mouth once a day 4)  Vitamin B-12 1000 Mcg Tabs (Cyanocobalamin) .... Once daily 5)  Meclizine Hcl 25 Mg Tabs (Meclizine hcl) .Marland Kitchen.. 1 q6h as needed 6)  Adult Aspirin Ec Low Strength 81 Mg Tbec (Aspirin) .Marland Kitchen.. 1 once daily 7)  Potassium Chloride Crys Cr 20 Meq Cr-tabs (Potassium chloride crys cr) .... One daily 8)  Tramadol Hcl 50 Mg Tabs (Tramadol hcl) .... One every 6 hours prn 9)  Omeprazole 40 Mg Cpdr (Omeprazole) .... One daily 10)  Dulcolax 5 Mg Tbec (Bisacodyl) .... Qd 11)  Miralax Powd (Polyethylene glycol 3350) .... Bid 12)  Nu-iron 150 Mg Caps (Polysaccharide iron complex) .... Qd 13)  Polyethylene Glycol 3350-grx Powd (Polyethylene glycol 3350) .... Used twice daily  Other Orders: Influenza Vaccine MCR (09811)  Patient Instructions: 1)  It is important that you exercise regularly at least 20 minutes 5 times a week. If you develop chest pain, have severe  difficulty breathing, or feel very tired , stop exercising immediately and seek medical attention. 2)  Please schedule a follow-up appointment in 3 months. Prescriptions: POLYETHYLENE GLYCOL 3350-GRX  POWD (POLYETHYLENE GLYCOL 3350) used twice daily  #carton x 6   Entered and Authorized by:   Gordy Savers  MD   Signed by:   Gordy Savers  MD on 06/26/2010   Method used:   Print then Give to Patient   RxID:   [id number] POLYETHYLENE GLYCOL 3350-GRX  POWD (POLYETHYLENE GLYCOL 3350) used twice daily  #527 gm x 6   Entered and Authorized by:   Gordy Savers  MD   Signed by:   Gordy Savers  MD on 06/26/2010   Method used:   Electronically to        [facility] [phone]* (retail)       [address] [place]  [address]       Ph: [phone] or [phone]       Fax: [phone]   RxID:   [id number] MIRALAX  POWD (POLYETHYLENE GLYCOL 3350) bid  #carton x 6   Entered and Authorized by:   Gordy Savers  MD   Signed by:   Gordy Savers  MD on 06/26/2010   Method  used:   Print then Give to Patient   RxID:   [id number]    Immunizations Administered:  Influenza Vaccine # 1:    Vaccine Type: Fluvax MCR    Site: left deltoid    Mfr: GlaxoSmithKline    Dose: 0.5 ml    Route: IM    Given by: Duard Brady LPN    Exp. Date: 03/24/2011    Lot #: [id number]    VIS given: 04/18/10 version given June 26, 2010.    Physician counseled: yes  Flu Vaccine Consent Questions:    Do you have a history of severe allergic reactions to this vaccine? no    Any prior history of allergic reactions to egg and/or gelatin? no    Do you have a sensitivity to the preservative Thimersol? no    Do you have a past history of Guillan-Barre Syndrome? no    Do you currently have an acute febrile illness? no    Have you ever had a severe reaction to latex? no    Vaccine information given and explained to patient? yes    Are you currently  pregnant? no

## 2010-10-26 NOTE — Procedures (Signed)
Summary: Colonoscopy Report/[facility]  Colonoscopy Report/[facility]   Imported By: Maryln Gottron 05/01/2010 15:07:41  _____________________________________________________________________  External Attachment:    Type:   Image     Comment:   External Document

## 2010-10-26 NOTE — Procedures (Signed)
Summary: Upper Endoscopy  Patient: Erin Gibson Note: All result statuses are Final unless otherwise noted.  Tests: (1) Upper Endoscopy (EGD)   EGD Upper Endoscopy       DONE     [facility]     [address] [place], [place]  [address]           ENDOSCOPY PROCEDURE REPORT           PATIENT:  Erin Gibson  MR#:  [id number]     BIRTHDATE:  [birth year], 89 yrs. old  GENDER:  female           ENDOSCOPIST:  Hedwig Morton. Juanda Chance, MD     Referred by:  Lonia Blood, M.D.           PROCEDURE DATE:  04/24/2010     PROCEDURE:  EGD with biopsy     ASA CLASS:  Class III     INDICATIONS:  anemia, abnormal imaging, abdominal pain Hgb 8.7.     MCV 77,           MEDICATIONS:   Versed 2.5 mg, Fentanyl 35 mcg     TOPICAL ANESTHETIC:  Cetacaine Spray           DESCRIPTION OF PROCEDURE:   After the risks benefits and     alternatives of the procedure were thoroughly explained, informed     consent was obtained.  The Pentax Gastroscope [id number] endoscope     was introduced through the mouth and advanced to the second     portion of the duodenum, without limitations.  The instrument was     slowly withdrawn as the mucosa was fully examined.     <<PROCEDUREIMAGES>>           Mild gastritis was found. atrophic appearing gastric mucosa,     decreased rugal pattern, visible submucosal vessels With standard     forceps, a biopsy was obtained and sent to pathology (see image5     and image7).  A stricture was found in the distal esophagus (see     image10 and image6). mild benign appearing distal stricture, scope     passed without difficulty  The duodenal bulb was normal in     appearance, as was the postbulbar duodenum. r/o villous atrophy     With standard forceps, a biopsy was obtained and sent to pathology     (see image3 and image9).    Retroflexed views revealed no     abnormalities.    The scope was then withdrawn from the patient     and the procedure completed.        COMPLICATIONS:  None           ENDOSCOPIC IMPRESSION:     1) Mild gastritis     2) Stricture in the distal esophagus     3) Normal duodenum     RECOMMENDATIONS:     1) Await biopsy results     check for PA: serum gastrin, B12, etc.     nothing to explain pt's N           REPEAT EXAM:  In 0 year(s) for.           ______________________________     Hedwig Morton. Juanda Chance, MD           CC:           n.     eSIGNED:   Hedwig Morton. Brodie at  04/24/2010 11:33 AM           Erin Gibson, [id number]  Note: An exclamation mark (!) indicates a result that was not dispersed into the flowsheet. Document Creation Date: 04/24/2010 11:33 AM _______________________________________________________________________  (1) Order result status: Final Collection or observation date-time: 04/24/2010 11:03 Requested date-time:  Receipt date-time:  Reported date-time:  Referring Physician:   Ordering Physician: Lina Sar [phone]) Specimen Source:  Source: Launa Grill Order Number: [id number] Lab site:

## 2010-12-08 LAB — GLUCOSE, CAPILLARY
Glucose-Capillary: 101 mg/dL — ABNORMAL HIGH (ref 70–99)
Glucose-Capillary: 102 mg/dL — ABNORMAL HIGH (ref 70–99)
Glucose-Capillary: 102 mg/dL — ABNORMAL HIGH (ref 70–99)
Glucose-Capillary: 104 mg/dL — ABNORMAL HIGH (ref 70–99)
Glucose-Capillary: 108 mg/dL — ABNORMAL HIGH (ref 70–99)
Glucose-Capillary: 111 mg/dL — ABNORMAL HIGH (ref 70–99)
Glucose-Capillary: 111 mg/dL — ABNORMAL HIGH (ref 70–99)
Glucose-Capillary: 121 mg/dL — ABNORMAL HIGH (ref 70–99)
Glucose-Capillary: 122 mg/dL — ABNORMAL HIGH (ref 70–99)
Glucose-Capillary: 58 mg/dL — ABNORMAL LOW (ref 70–99)
Glucose-Capillary: 90 mg/dL (ref 70–99)
Glucose-Capillary: 90 mg/dL (ref 70–99)
Glucose-Capillary: 93 mg/dL (ref 70–99)
Glucose-Capillary: 97 mg/dL (ref 70–99)
Glucose-Capillary: 98 mg/dL (ref 70–99)

## 2010-12-08 LAB — CBC
HCT: 24.5 % — ABNORMAL LOW (ref 36.0–46.0)
HCT: 26.3 % — ABNORMAL LOW (ref 36.0–46.0)
HCT: 28.5 % — ABNORMAL LOW (ref 36.0–46.0)
Hemoglobin: 8.1 g/dL — ABNORMAL LOW (ref 12.0–15.0)
Hemoglobin: 9.2 g/dL — ABNORMAL LOW (ref 12.0–15.0)
MCHC: 33.1 g/dL (ref 30.0–36.0)
MCHC: 33.1 g/dL (ref 30.0–36.0)
MCV: 78.4 fL (ref 78.0–100.0)
MCV: 79.2 fL (ref 78.0–100.0)
Platelets: 186 10*3/uL (ref 150–400)
Platelets: 206 10*3/uL (ref 150–400)
RBC: 3.25 MIL/uL — ABNORMAL LOW (ref 3.87–5.11)
RBC: 3.59 MIL/uL — ABNORMAL LOW (ref 3.87–5.11)
RDW: 17.5 % — ABNORMAL HIGH (ref 11.5–15.5)
RDW: 18 % — ABNORMAL HIGH (ref 11.5–15.5)
RDW: 18 % — ABNORMAL HIGH (ref 11.5–15.5)
RDW: 18.1 % — ABNORMAL HIGH (ref 11.5–15.5)
WBC: 10 10*3/uL (ref 4.0–10.5)
WBC: 4.8 10*3/uL (ref 4.0–10.5)
WBC: 6.9 10*3/uL (ref 4.0–10.5)
WBC: 8.1 10*3/uL (ref 4.0–10.5)
WBC: 9.4 10*3/uL (ref 4.0–10.5)

## 2010-12-08 LAB — BASIC METABOLIC PANEL
BUN: 10 mg/dL (ref 6–23)
BUN: 11 mg/dL (ref 6–23)
BUN: 4 mg/dL — ABNORMAL LOW (ref 6–23)
BUN: 5 mg/dL — ABNORMAL LOW (ref 6–23)
Calcium: 8.8 mg/dL (ref 8.4–10.5)
Chloride: 109 mEq/L (ref 96–112)
Chloride: 109 mEq/L (ref 96–112)
Chloride: 110 mEq/L (ref 96–112)
Creatinine, Ser: 0.91 mg/dL (ref 0.4–1.2)
Creatinine, Ser: 0.92 mg/dL (ref 0.4–1.2)
Creatinine, Ser: 1.04 mg/dL (ref 0.4–1.2)
GFR calc Af Amer: >60 mL/min (ref >60)
GFR calc Af Amer: >60 mL/min (ref >60)
GFR calc non Af Amer: >60 mL/min (ref >60)
Glucose, Bld: 118 mg/dL — ABNORMAL HIGH (ref 70–99)
Glucose, Bld: 96 mg/dL (ref 70–99)
Glucose, Bld: 99 mg/dL (ref 70–99)
Potassium: 3.5 mEq/L (ref 3.5–5.1)
Potassium: 3.7 mEq/L (ref 3.5–5.1)
Potassium: 3.9 mEq/L (ref 3.5–5.1)
Sodium: 135 mEq/L (ref 135–145)
Sodium: 143 mEq/L (ref 135–145)

## 2010-12-08 LAB — CULTURE, BLOOD (ROUTINE X 2)
Culture: NO GROWTH
Culture: NO GROWTH

## 2010-12-08 LAB — C-REACTIVE PROTEIN: CRP: 15.3 mg/dL — ABNORMAL HIGH (ref <0.6)

## 2010-12-08 LAB — SYNOVIAL CELL COUNT + DIFF, W/ CRYSTALS
Lymphocytes-Synovial Fld: 1 % (ref 0–20)
Neutrophil, Synovial: 97 % — ABNORMAL HIGH (ref 0–25)

## 2010-12-08 LAB — URINE CULTURE: Special Requests: NEGATIVE

## 2010-12-08 LAB — GASTRIN: Gastrin: 1356 pg/mL — ABNORMAL HIGH (ref <101)

## 2010-12-08 LAB — BODY FLUID CULTURE

## 2010-12-08 LAB — GRAM STAIN

## 2010-12-08 LAB — VITAMIN B12: Vitamin B-12: >2000 pg/mL — ABNORMAL HIGH (ref 211–911)

## 2010-12-08 LAB — IRON AND TIBC
Iron: 19 ug/dL — ABNORMAL LOW (ref 42–135)
TIBC: 284 ug/dL (ref 250–470)

## 2010-12-08 LAB — URIC ACID: Uric Acid, Serum: 4.1 mg/dL (ref 2.4–7.0)

## 2010-12-09 LAB — CBC
HCT: 25.9 % — ABNORMAL LOW (ref 36.0–46.0)
Hemoglobin: 8.4 g/dL — ABNORMAL LOW (ref 12.0–15.0)
MCH: 24.8 pg — ABNORMAL LOW (ref 26.0–34.0)
MCH: 25.3 pg — ABNORMAL LOW (ref 26.0–34.0)
MCHC: 32.2 g/dL (ref 30.0–36.0)
MCHC: 32.7 g/dL (ref 30.0–36.0)
MCV: 77 fL — ABNORMAL LOW (ref 78.0–100.0)
MCV: 77.3 fL — ABNORMAL LOW (ref 78.0–100.0)
MCV: 78 fL (ref 78.0–100.0)
MCV: 78.3 fL (ref 78.0–100.0)
Platelets: 219 10*3/uL (ref 150–400)
Platelets: 221 10*3/uL (ref 150–400)
Platelets: 293 10*3/uL (ref 150–400)
RDW: 16.7 % — ABNORMAL HIGH (ref 11.5–15.5)
RDW: 16.9 % — ABNORMAL HIGH (ref 11.5–15.5)
RDW: 17 % — ABNORMAL HIGH (ref 11.5–15.5)
WBC: 6.1 10*3/uL (ref 4.0–10.5)
WBC: 7.3 10*3/uL (ref 4.0–10.5)

## 2010-12-09 LAB — BASIC METABOLIC PANEL
BUN: 14 mg/dL (ref 6–23)
BUN: 16 mg/dL (ref 6–23)
BUN: 4 mg/dL — ABNORMAL LOW (ref 6–23)
CO2: 22 mEq/L (ref 19–32)
CO2: 23 mEq/L (ref 19–32)
Calcium: 8.6 mg/dL (ref 8.4–10.5)
Chloride: 112 mEq/L (ref 96–112)
Chloride: 113 mEq/L — ABNORMAL HIGH (ref 96–112)
Creatinine, Ser: 1.05 mg/dL (ref 0.4–1.2)
Creatinine, Ser: 1.11 mg/dL (ref 0.4–1.2)
GFR calc non Af Amer: 46 mL/min — ABNORMAL LOW (ref >60)
Glucose, Bld: 120 mg/dL — ABNORMAL HIGH (ref 70–99)
Potassium: 3.7 mEq/L (ref 3.5–5.1)
Sodium: 141 mEq/L (ref 135–145)

## 2010-12-09 LAB — GLUCOSE, CAPILLARY
Glucose-Capillary: 116 mg/dL — ABNORMAL HIGH (ref 70–99)
Glucose-Capillary: 119 mg/dL — ABNORMAL HIGH (ref 70–99)
Glucose-Capillary: 91 mg/dL (ref 70–99)

## 2010-12-09 LAB — URINE MICROSCOPIC-ADD ON

## 2010-12-09 LAB — URINE CULTURE
Colony Count: >=100000
Culture  Setup Time: 201107282033

## 2010-12-09 LAB — COMPREHENSIVE METABOLIC PANEL
AST: 16 U/L (ref 0–37)
Albumin: 3.5 g/dL (ref 3.5–5.2)
BUN: 27 mg/dL — ABNORMAL HIGH (ref 6–23)
Calcium: 9.3 mg/dL (ref 8.4–10.5)
Chloride: 106 mEq/L (ref 96–112)
Creatinine, Ser: 1.29 mg/dL — ABNORMAL HIGH (ref 0.4–1.2)
GFR calc Af Amer: 47 mL/min — ABNORMAL LOW (ref >60)
Total Bilirubin: 1.1 mg/dL (ref 0.3–1.2)
Total Protein: 6.3 g/dL (ref 6.0–8.3)

## 2010-12-09 LAB — TYPE AND SCREEN
ABO/RH(D): A POS
Antibody Screen: NEGATIVE

## 2010-12-09 LAB — URINALYSIS, ROUTINE W REFLEX MICROSCOPIC
Glucose, UA: NEGATIVE mg/dL
Hgb urine dipstick: NEGATIVE
Ketones, ur: NEGATIVE mg/dL
Protein, ur: NEGATIVE mg/dL
Urobilinogen, UA: 0.2 mg/dL (ref 0.0–1.0)

## 2010-12-09 LAB — DIFFERENTIAL
Basophils Absolute: 0 10*3/uL (ref 0.0–0.1)
Lymphocytes Relative: 15 % (ref 12–46)
Lymphs Abs: 1.3 10*3/uL (ref 0.7–4.0)
Monocytes Absolute: 0.6 10*3/uL (ref 0.1–1.0)
Neutro Abs: 6.7 10*3/uL (ref 1.7–7.7)

## 2010-12-09 LAB — LIPASE, BLOOD: Lipase: 41 U/L (ref 11–59)

## 2010-12-09 LAB — LACTIC ACID, PLASMA: Lactic Acid, Venous: 0.9 mmol/L (ref 0.5–2.2)

## 2011-02-06 NOTE — Assessment & Plan Note (Signed)
[facility]   NAME:  Erin Gibson, Erin Gibson                ACCOUNT NO.:  [id number]   MEDICAL RECORD NO.:  [id number]      DATE OF BIRTH:  Jan 03, 1919   PHYSICIAN:  Lenon Curt. Chilton Si, M.D.   VISIT DATE:  05/06/2009                                   OFFICE VISIT   HISTORY:  An 77 year old female returns to clinic for recheck of chronic  venous insufficiency and stasis ulcers.  Wounds continued to be a little  tender.  They are improved.  They have no odor.  Drainage is minimal.   PHYSICAL EXAMINATION:  Right medial ankle and right ankle anteriorly  were seen and appeared improved overall.  We reapplied Puracol AG,  hydrogel, Adaptic, ABD pad, and an Radio broadcast assistant.  The patient says she is  to go surgery with Dr. Tawanna Cooler Early next week for repair of her  varicosities and Dr. Arbie Cookey will continue to see her for at least a week  or two after this.  She is scheduled to return to this clinic in 3 weeks  for reevaluation of her wounds.   ICD-9, 454.2.  CPT [id number].      Lenon Curt Chilton Si, M.D.  Electronically Signed     AGG/MEDQ  D:  05/06/2009  T:  05/07/2009  Job:  [id number]   cc:   Larina Earthly, M.D.

## 2011-02-06 NOTE — Procedures (Signed)
LOWER EXTREMITY VENOUS REFLUX EXAM   INDICATION:  Right leg varicose veins with ulcer.   EXAM:  Using color-flow imaging and pulse Doppler spectral analysis, the  right and left common femoral, superficial femoral, popliteal, posterior  tibial, greater and lesser saphenous veins are evaluated.  There is no  evidence suggesting deep venous insufficiency in the right or left lower  extremity.   The right and left saphenofemoral junctions are not competent.  The  right and left GSV are not competent with the caliber as described  below.   The right and left proximal short saphenous vein demonstrate competency.   GSV Diameter (used if found to be incompetent only)                                            Right       Left  Proximal Greater Saphenous Vein           0.5 cm      0.6 cm  Proximal-to-mid-thigh                     0.5 cm      0.4 cm  Mid thigh                                 0.5 cm      0.1 cm  Mid-distal thigh                          0.5 cm      0.1 cm  Distal thigh                              0.7 cm      0.6 cm  Knee                                      0.6 cm      0.3 cm   IMPRESSION:  1. Right and left greater saphenous vein reflux is identified with the      caliber ranging from 0.7 cm to 0.5 cm from the knee to groin on the      right, 0.1 to 0.6 cm knee to groin on the left.  2. The right/left greater saphenous veins are not aneurysmal.  3. The right and left greater saphenous veins are not tortuous.  4. The deep venous system is competent.  5. The right and left lesser saphenous veins are competent.  6. On the left the saphenofemoral junction is incompetent.  The      proximal 10 cm to the left greater saphenous vein is incompetent up      to a tributary branch.  The midportion of the left greater      saphenous vein is competent.  There is a network of tributaries at      the midportion of the greater saphenous vein which reconnect  distally to the greater saphenous vein at the distal thigh.  Distal      to this point, the left greater saphenous vein is once again      incompetent.  ___________________________________________  Larina Earthly, M.D.   MC/MEDQ  D:  04/27/2009  T:  04/27/2009  Job:  [id number]

## 2011-02-06 NOTE — Assessment & Plan Note (Signed)
[facility]   NAME:  Erin Gibson, Erin Gibson                ACCOUNT NO.:  [id number]   MEDICAL RECORD NO.:  [id number]      DATE OF BIRTH:  1918-12-19   PHYSICIAN:  Maxwell Caul, M.D. VISIT DATE:  03/31/2009                                   OFFICE VISIT   Erin Gibson is a patient who has actually been seen here in the remote  past for a nonhealing wound on the right foot.  This eventually cleared,  this was in 2007.   Currently, she tells me that she has an area of erythema on her medial  right ankle that has been present for about a year.  She had resisted  suggestions from Erin Gibson, who is her primary doctor, to attend  our clinic earlier.  She developed a small open area in the middle of  the erythema here, and she has had some serosanguineous discharge from  this.  She has been treating this at home herself with topical peroxide,  warm water soap soaks, etc.  Nothing seems to have helped, and she comes  in today.   Her major complaints are pain in the area and also the small open area,  which is now draining.  Gastroesophageal reflux disease, hypertension,  osteoarthrosis arthritis, peripheral vascular disease, DVT and pulmonary  embolism, carpal tunnel disease, gait ataxia with multiple falls,  chronic venous insufficiency, weight loss.   CURRENT MEDICATIONS:  Reviewed.   PHYSICAL EXAMINATION:  GENERAL:  This is a very delightful 77 year old  woman, in no distress.  RESPIRATORY:  She has a tremendous kyphoscoliosis.  Her breath sounds  are shallow, but there are no wheezes or crackles.  Her work of  breathing is normal.  CARDIAC:  Heart sounds are normal.  There is no increase in jugular  venous pressure.  EXTREMITIES:  She has very prominent dilated varicosities on her thighs  and on her lower legs.  The area in question is in the medial part of  the right leg.  There is a tremendous degree of erythema here, which is  warm and tender.  She tells me  this is completely unchanged over the  last year, however.  Her peripheral pulses are reduced but palpable.  Her ABI was within the normal range.  In the middle of this area of  erythema is a small open wound, this was not draining currently.   IMPRESSION:  Severe venous stasis disease in the setting of venous  varicosities and a small of venous stasis ulcer.  We applied  trichloroacetic acid to this entire area and wrapped her in an Unna  wrap.  I gave her Ultram 50 p.o. q.6 h. p.r.n.  She already has Darvocet  at home; however, in the face of an older person who is falling, I would  recommend discontinuing this drug.  In any case, an area of localized  venous stasis to the medial right ankle might be amendable to venous  evaluation and referral to the vein and vascular Center.  Although this  seems fairly aggressive for an 77 year old frail woman, the concern here  is that the area might all ulcerate at some point.  It is  worth noting that both her daughter and she described the  area as  unchanged for many months to weeks and therefore, any notion of  bacterial infection I had was dissipated.   We will see her again in 1 week.  I am hopeful she will be able to  tolerate the wraps although I have my doubts.           ______________________________  Maxwell Caul, M.D.     MGR/MEDQ  D:  03/31/2009  T:  04/01/2009  Job:  [id number]

## 2011-02-06 NOTE — Assessment & Plan Note (Signed)
[facility]   NAME:  Erin Gibson, Erin Gibson                ACCOUNT NO.:  [id number]   MEDICAL RECORD NO.:  [id number]      DATE OF BIRTH:  04-25-1919   PHYSICIAN:  Lenon Curt. Chilton Si, M.D.   VISIT DATE:  04/15/2009                                   OFFICE VISIT   HISTORY:  An 77 year old female with chronic venous insufficiency and  stasis ulcers, returns for recheck of her wounds.  The patient continues  to have some tenderness.  She barely tolerated the Unna boot that was  applied last week, but says that she learned to adapt to it.  She did  have a positive culture reports returned on April 11, 2009, from the  culture done April 08, 2009, showing few Staphylococcus species coag-  negative.  Cephalexin 500 mg t.i.d. x10 days was prescribed on April 11, 2009.  The patient has tolerated the antibiotic okay.  It did make her a  bit queasy.  There is no diarrhea.   There is some new erythema and some shallow ulceration of the anterior  portion of the lower leg as it meets the foot, which we believe is due  to irritation from the [place] boot last time.  Additional padding will be  necessary in this area.   PHYSICAL EXAMINATION:  Temperature 97.8, pulse 61, respirations 20, and  blood pressure 157/67.  Wound measurements of the right medial ankle now  0.5 x 0.5 x 0.1 right ankle anteriorly 2.0 x 0.3 x 0.1.  There is  erythema, tenderness, and discomfort, and some moderate weeping of this  area.  Skin is very fragile.  There are varicose veins, which coursed up  the leg.   The patient had an appointment scheduled with vascular and vein  specialist on August 4, which she intends to keep.   TREATMENT:  Reapply Puracol Ag, hydrogel, Adaptic, and ABD pad to  further pad the anterior portion of the foot and then an Unna boot and  she will return in 1 week for recheck of this area.  She is to continue  her cephalexin and to go to the vascular and vein doctor appointment as   scheduled.   ICD-9 CODE:  454.2.   CPT CODE:  [address].      Lenon Curt Chilton Si, M.D.  Electronically Signed     AGG/MEDQ  D:  04/15/2009  T:  04/16/2009  Job:  [id number]

## 2011-02-06 NOTE — Procedures (Signed)
DUPLEX DEEP VENOUS EXAM - LOWER EXTREMITY   INDICATION:  Followup right greater saphenous vein laser ablation.   HISTORY:  Edema:  No.  Trauma/Surgery:  Right greater saphenous vein laser ablation on  05/11/2009.  Pain:  Right medial distal thigh pain.  PE:  No.  Previous DVT:  No.  Anticoagulants:  Other:   DUPLEX EXAM:                CFV   SFV   PopV  PTV    GSV                R  L  R  L  R  L  R   L  R  L  Thrombosis    0  0  0     0     0      +  Spontaneous   +  +  +     +     +      0  Phasic        +  +  +     +     +      0  Augmentation  +  +  +     +     +      0  Compressible  +  +  +     +     +      0  Competent     0  0  +     +     +      0   Legend:  + - yes  o - no  p - partial  D - decreased   IMPRESSION:  1. No evidence of deep vein thrombosis noted in the right lower      extremity with mild reflux noted in the bilateral common femoral      veins.  2. Total occlusion of the right greater saphenous vein from the distal      insertion site to the groin area.  No reflux is noted at the right      saphenofemoral junction.      _____________________________  Larina Earthly, M.D.   CH/MEDQ  D:  05/18/2009  T:  05/18/2009  Job:  [id number]

## 2011-02-06 NOTE — Assessment & Plan Note (Signed)
OFFICE VISIT   Erin Gibson, Erin Gibson  DOB:  12-Dec-1918                                       05/11/2009  YQMVH#:[id number]   The patient presents today for treatment of her severe venous  hypertension.  Her right leg venous ulcer is nearly healed.   Today she underwent laser ablation of her great saphenous vein from just  below the knee to her saphenofemoral junction and also stab phlebectomy  of multiple tributaries varicosities in her calf.  She had no immediate  complications and was discharged to home.  She will be seen again in 1  week for continued followup.   Larina Earthly, M.D.  Electronically Signed   TFE/MEDQ  D:  05/11/2009  T:  05/12/2009  Job:  [id number]   cc:   Lenon Curt. Chilton Si, M.D.  Gordy Savers, MD

## 2011-02-06 NOTE — Assessment & Plan Note (Signed)
OFFICE VISIT   Erin Gibson, Erin Gibson  DOB:  1918/10/12                                       05/18/2009  ZOXWR#:[id number]   The patient presents today for a 1 week followup of her right great  saphenous vein laser ablation and stab phlebectomy for correction of  venous hypertension.  Her venous ulcer looks quite good today.  She has  essentially no discomfort related to the phlebectomy sites and has mild  bruising.  She does have the usual amount of tenderness over the  ablation site and the usual amount of erythema and thickness.   She underwent repeat venous duplex today and this reveals closure of her  saphenous vein from the knee to the groin and has no evidence of injury  to her deep system.   I am quite pleased with her initial result as is the patient.  Will plan  to see her again in 6 weeks for final followup.   Larina Earthly, M.D.  Electronically Signed   TFE/MEDQ  D:  05/18/2009  T:  05/19/2009  Job:  [id number]

## 2011-02-06 NOTE — Assessment & Plan Note (Signed)
[facility]   NAME:  Erin Gibson, Erin Gibson                ACCOUNT NO.:  [id number]   MEDICAL RECORD NO.:  [id number]      DATE OF BIRTH:  Mar 08, 1919   PHYSICIAN:  Lenon Curt. Chilton Si, M.D.   VISIT DATE:  04/22/2009                                   OFFICE VISIT   HISTORY:  77 year old female returns for recheck of chronic venous  insufficiency and stasis ulcers.  Wounds continue to be somewhat tender  but are improved over the last week.  She tolerated the Unna boot well  this week.  Wound culture from April 08, 2009, returned on April 11, 2009,  showing few staph species, coag-negative.  She was put on cephalexin 500  mg t.i.d. for 10 days.  The patient does feel this was helpful, and in  fact, drainage subsided within a couple of days after being started on  this antibiotic.   PHYSICAL EXAMINATION:  VITAL SIGNS:  Temp 98.3, pulse 62, respirations  19, blood pressure 170/62.  EXTREMITIES:  Right medial ankle ulcer now measuring 0.4 x 0.2 x 0 cm  depth.  Right ankle anteriorly measures 0.1 x 0.1 x less than 0.1 cm.  Wounds appear clean and are smaller than previously noted.  The patient  has finished her antibiotics as of 2 days ago.   TREATMENT:  Reapplied Puracol Ag, hydrogel, Adaptic, ABD pad, and Unna  boot to the right leg.  The patient is to see Dr. Tawanna Cooler Early next week  for evaluation of varicosities since there are many large varicosities  present, which look like they may potentially rupture.   The patient will see Dr. Tawanna Cooler Early next week as planned.  She will  return to this clinic in 1 week.   ICD-9 code 454.2 varicose veins with ulcer and inflammation.   CPT code [address].      Lenon Curt Chilton Si, M.D.  Electronically Signed     AGG/MEDQ  D:  04/22/2009  T:  04/23/2009  Job:  [id number]   cc:   Larina Earthly, M.D.

## 2011-02-06 NOTE — Assessment & Plan Note (Signed)
OFFICE VISIT   AVANA, KREISER  DOB:  Mar 11, 1919                                       07/01/2009  ZOXWR#:[id number]   The patient presents today for follow-up of her laser ablation and stab  phlebectomy of her right great saphenous vein and tributary  varicosities.  The procedure was on 05/11/2009.  She has done quite well  following the procedure, has completely healed her venous ulcer and is  quite pleased with her results.  She has no postoperative difficulty or  soreness.   PHYSICAL EXAMINATION:  Her bruising is all resolved and her ulcer is  completely healed.   She is pleased with the result as am I.  We will see her again on an as-  needed basis.   Larina Earthly, M.D.  Electronically Signed   TFE/MEDQ  D:  07/01/2009  T:  07/05/2009  Job:  [id number]   cc:   Gordy Savers, MD

## 2011-02-06 NOTE — Consult Note (Signed)
NEW PATIENT CONSULTATION   Erin Gibson, Erin Gibson W  DOB:  1919/06/10                                       04/27/2009  ZOXWR#:[id number]   The patient presents today for evaluation of right leg venous  hypertension.  She is a very pleasant 77 year old white female with a 60  year history of venous pathology in her right leg.  She does report some  mild left leg swelling but this is minimal compared to her right leg.  She has had a venous ulcer for almost 1 year now.  This is extensive on  the medial aspect above her right medial malleolus.  She has been  treated appropriately with compression, Unna boot and other topical  care.  She is having continued progress and closure of this wound.  She  does have a history of what appears to have been removal of tributary  varicosities in her calf many years ago.  Incisions do not appear that  she has ever had any stripping of her saphenous vein.  She reports pain  associated with this and has marked swelling in her right leg even  before the ulcer formation.  She has had significant pain with the ulcer  formation as well.   PAST MEDICAL HISTORY:  Negative for diabetes.  She does have history of  hypertension, does not have any history of cardiac disease.   FAMILY HISTORY:  Significant for premature atherosclerotic disease in  her brother and sister.   SOCIAL HISTORY:  She is widowed with one child.  She is a retired  [employer] operational officer.  She does not smoke or drink alcohol.   REVIEW OF SYSTEMS:  Positive for loss of appetite and weight loss.  Her  weight is 128 pounds.  She has no cardiac or pulmonary history.  She  does have history of hiatal hernia, reflux, occasional constipation.  Does have history of vertigo, arthritic joint pain and anemia.   ALLERGIES:  Are to codeine.   CURRENT MEDICATIONS:  Are Lasix, Accupril, Darvocet as needed for pain,  meclizine, 81 mg aspirin daily, Nexium, potassium chloride supplements,  Feosol  supplements, tramadol as needed for pain and Keflex.   PHYSICAL EXAMINATION:  A well-developed, well-nourished white female  appearing younger than her stated age of 85.  Blood pressure is 188/77,  pulse 72, respirations 20, temperature is 97.3.  She is grossly intact  neurologically.  Her radial and dorsalis pedis pulses are palpable  bilaterally.  Her left leg is notable for multiple telangiectasia but no  significant varicosities.  In the right leg she does have several scars  on her calves from old vein removal.  She does have a large healing  venous ulcer above the medial malleolus on the right.   She underwent noninvasive vascular laboratory studies in our office and  this reveals reflux throughout her saphenous veins bilaterally.  Her  deep system is competent bilaterally.   I discussed this at length with the patient and her daughter present.  I  have recommended that we proceed with laser ablation of her right great  saphenous vein and stab phlebectomy of tributary varicosities in her  calves to reduce her venous hypertension and hopefully continue with  healing of her large venous ulcer that has been present for nearly 1  year.  I explained the procedure is an outpatient  procedure under local  anesthesia taking approximately 1 and 1/2 hours.  She understands and  wishes to proceed as soon as we can schedule the procedure with her.   Larina Earthly, M.D.  Electronically Signed   TFE/MEDQ  D:  04/27/2009  T:  04/27/2009  Job:  [id number]   cc:   Lenon Curt. Chilton Si, M.D.  Gordy Savers, MD

## 2011-02-06 NOTE — Assessment & Plan Note (Signed)
[facility]   NAME:  Erin Gibson, [name]                ACCOUNT NO.:  [id number]   MEDICAL RECORD NO.:  [id number]      DATE OF BIRTH:  Feb 09, 1919   PHYSICIAN:  Lenon Curt. Chilton Si, M.D.   VISIT DATE:  04/08/2009                                   OFFICE VISIT   HISTORY:  An 77 year old female returns for recheck of varicose vein  with ulcer and inflammation.  The patient had an Unna boot applied last  week and she tolerated this fairly well.  Unfortunately, she has had a  new ulceration appeared in the anterior aspect of the right ankle.  The  ulceration of right medial ankle did fairly well and actually seems to  be a bit improved since she was last seen.  Discomfort that she  anticipated from wearing the Unna boot was minimal, so she was pleased  with that aspect of it.  There has been no fever, chills, or odor to the  wound.   PHYSICAL EXAMINATION:  Temp 98.5, pulse 60, respirations 20, and blood  pressure 133/74.  Wound measurement, right medial ankle 0.5 x 0.5 x 0.1  cm and new ulcer of the right ankle anteriorly now 0.1 x 0.1 x 0.1 cm.  Both wounds have some erythema surrounding them and there is some  erythema of the skin and an area about 1-2 inches away from these  wounds.   TREATMENT:  Culture was done of the wound at the right ankle.  We  applied Puracol AG covered with hydrogel, Adaptic, and reapplied the  [employer].  She is to return in 1 week for recheck.   ICD-9 454.2 varicose vein with ulcer and inflammation.  ICD-9 code [address].   The patient was to be referred to vascular and vein surgeons for  reevaluation of her severe varicosities and with attendant skin changes  distally in her leg.      Lenon Curt Chilton Si, M.D.  Electronically Signed     AGG/MEDQ  D:  04/08/2009  T:  04/09/2009  Job:  [id number]

## 2011-02-09 NOTE — Consult Note (Signed)
NAME:  Erin Gibson, Erin Gibson                ACCOUNT NO.:  [id number]   MEDICAL RECORD NO.:  [id number]          PATIENT TYPE:  REC   LOCATION:  FOOT                         FACILITY:  [facility]   PHYSICIAN:  Theresia Majors. Tanda Rockers, M.D.DATE OF BIRTH:  1918-12-07   DATE OF CONSULTATION:  DATE OF DISCHARGE:                                   CONSULTATION   DATE:  April 05, 2006   REASON FOR CONSULTATION:  Miss Hasley is an 77 year old lady who has a  puncture ulceration on the lateral aspect of the right foot has been present  for several months.  She has been referred by Dr. Lestine Box for evaluation..   IMPRESSION:  Probable periostitis extending down to the proximal osteophyte  involving the fifth metatarsal.   RECOMMENDATIONS:  A curette debridement locally with local wound care..   SUBJECTIVE:  Erin Gibson is an active 77 year old who does her own driving, is  ambulatory in her own residence who has had the above lesion present for  several months.  She has been seen by Dr. Lestine Box, has in fact been treated  with a course of antibiotics with no improvement.  She denies fever or  swelling.  Her past medical history is remarkable for gastroesophageal  reflux.   CURRENT MEDICATIONS:  Include Nexium 40 mg a day, quinapril 40 mg q. daily,  Lasix 40 mg a day and Darvocet p.r.n. for pain.   She has had multiple orthopedic procedures including bilateral hips and  bilateral knee replacements, hysterectomy and hernia repair,  cholecystectomy, all without complications.   FAMILY HISTORY:  Is positive for diabetes, heart attack, stroke, negative  for cancer.   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  She lives in [place]  alone.  Her daughter lives in [place].   REVIEW OF SYSTEMS:  Is remarkable for the absence of angina pectoris, her  weight has been stable.  She has no GI or GU complaints.  She remains  ambulatory although she still has some difficulty in her right knee.  The  remainder of  the review of systems is negative.   PHYSICAL EXAM:  She is alert, pleasant elderly female in no acute distress.  She is in good contact with reality and responds appropriately.  Her vital  signs stable.  She is afebrile.  HEENT:  Exam is clear.  Neck is supple.  Trachea is midline.  Thyroid is  nonpalpable.  LUNGS:  Lungs were clear.  BREAST EXAM:  Deferred.  The abdomen is soft.  There is no aneurysmal dilatation.  EXTREMITIES:  Femoral pulses are faintly palpable.  The exam of the lower  extremities is abnormal.  There are prominent reticular and ropy  varicosities in both lower extremities.  There are chronic changes of  stasis.  Postsurgical changes of knee replacements are evident.  On the  lateral aspect of the right lower extremity there is a puncture-like  wound  extending down onto periosteum.  This area was probed with the bare end of  an applicator tip and it  definitely extends down to the bone.  There is a  serous drainage which is non malodorous.  The dorsalis pedis pulses are 2+  readily palpable bilaterally.  Neurologically protective sensation is  preserved.  There is no adenopathy.   DISCUSSION:  This solitary puncture appearing wound undoubtedly extends down  to the metatarsal.  The patient has had noninvasive vascular studies  confirming noncritical vascular disease.  She has had an MRI which has not  demonstrated a foreign body.  I suspect that the chronicity of this wound is  related to some subtle infection.  I would be in favor of a local  infiltration and a curette debridement of the presumed periostitis and  continue to treat this wound with antibiotics and offloading.  I have  volunteered to the patient that I would discuss this recommendation with Dr.  Lestine Box  and we would be happy to proceed with that in the wound center or  he may elect to defer this aggressive mode of treatment for a more  conservative p.o. antibiotic approach.  Both alternatives were  presented to  the patient in terms that she seems to understand.  She will be seen in  follow-up post receipt of the consultation by Dr. Lestine Box.           ______________________________  Theresia Majors. Tanda Rockers, M.D.     Cephus Slater  D:  04/05/2006  T:  04/05/2006  Job:  [id number]

## 2011-02-09 NOTE — Assessment & Plan Note (Signed)
[facility]                            [facility] OFFICE NOTE   NAME:Gibson, Erin SALTOS                       MRN:          [id number]  DATE:10/02/2006                            DOB:          05/26/1919    An 77 year old female seen today for an annual examination.  She has  hypertension, DJD, gastroesophageal reflux disease.  She is followed by  Dr. Lequita Halt.  She does remarkably well.  Has had a right carpal tunnel  release, remote history of PE in 1995.  Also had some deep vein  thrombosis problems in 1952.  She has had bilateral total knee  replacement surgeries, cataract surgery, and a hysterectomy at age 89.   Medical regimen reviewed.   REVIEW OF SYSTEMS:  Negative.  She had a negative Cardiolite stress test  in 2005.  Last colonoscopy was in 2003.   FAMILY HISTORY:  Reviewed.  Basically unchanged.  Positive coronary  artery disease, breast cancer.   PHYSICAL EXAMINATION:  GENERAL:  An elderly white female, alert, in no  acute distress.  VITAL SIGNS:  Blood pressure was 130/72.  SKIN:  Unremarkable except for senile changes.  HEENT:  Fundi, ear, nose and throat unremarkable.  Patient edentulous.  NECK:  No bruits.  CHEST:  Clear.  Kyphosis noted.  CARDIOVASCULAR:  Normal S1, S2, no murmurs.  BREASTS:  Negative.  ABDOMEN:  Multiple surgical scars.  No organomegaly.  She does have a  prominent aortic pulsation, epigastric area.  Femoral pulses were full  without bruits.  PELVIC:  Absent uterus.  No adnexal masses.  RECTAL:  Stool heme-negative.  EXTREMITIES:  Intact left dorsalis pedis pulse.  Other peripheral pulses  were nonpalpable.  Examination of the right lateral foot did reveal a  tiny 2-3 mm ulcer.  No purulent drainage could be expressed.  It was  clean.   IMPRESSION:  1. Hypertension.  2. Degenerative joint disease.  3. Gastroesophageal reflux disease.  4. Peripheral vascular occlusive disease.   DISPOSITION:  Medical  regimen unchanged with the exception that she was  told to take a baby aspirin daily as well as calcium supplements.  Recheck here in 4-6 months.     Gordy Savers, MD  Electronically Signed    PFK/MedQ  DD: 10/02/2006  DT: 10/02/2006  Job #: [id number]

## 2011-03-02 ENCOUNTER — Encounter: Payer: Self-pay | Admitting: Internal Medicine

## 2011-04-06 ENCOUNTER — Encounter: Payer: Self-pay | Admitting: Internal Medicine

## 2011-04-10 ENCOUNTER — Encounter: Payer: Self-pay | Admitting: Internal Medicine

## 2011-04-10 ENCOUNTER — Ambulatory Visit (INDEPENDENT_AMBULATORY_CARE_PROVIDER_SITE_OTHER): Payer: Medicare Other | Admitting: Internal Medicine

## 2011-04-10 DIAGNOSIS — M159 Polyosteoarthritis, unspecified: Secondary | ICD-10-CM

## 2011-04-10 DIAGNOSIS — I10 Essential (primary) hypertension: Secondary | ICD-10-CM

## 2011-04-10 DIAGNOSIS — K219 Gastro-esophageal reflux disease without esophagitis: Secondary | ICD-10-CM

## 2011-04-10 MED ORDER — DICLOFENAC SODIUM 50 MG PO TBEC: 50.0000 mg | DELAYED_RELEASE_TABLET | Freq: Two times a day (BID) | ORAL | Status: DC

## 2011-04-10 NOTE — Patient Instructions (Signed)
Limit your sodium (Salt) intake  Return in 3 months for follow-up  Avoids foods high in acid such as tomatoes citrus juices, and spicy foods.  Avoid eating within two hours of lying down or before exercising.  Do not overheat.  Try smaller more frequent meals.  If symptoms persist, elevate the head of her bed 12 inches while sleeping.

## 2011-04-10 NOTE — Progress Notes (Signed)
  Subjective:    Patient ID: Erin Gibson, female    DOB: [birth year], 77 y.o.   MRN: [id number]  HPI  77 year old patient who is seen today for followup. Her main complaint is osteoarthritic. She is requesting a new prescription for Voltaren which has been quite helpful in the past. She does take tramadol for pain. She has a history of venous insufficiency and venous stasis ulceration which has been stable. She has gastroesophageal reflux disease controlled on Prilosec she has treated hypertension. She does have a history of anemia and is on B12 tablets. She was hospitalized one year ago for small bowel obstruction. Otherwise he does remarkably well    Review of Systems  Constitutional: Negative.   HENT: Negative for hearing loss, congestion, sore throat, rhinorrhea, dental problem, sinus pressure and tinnitus.   Eyes: Negative for pain, discharge and visual disturbance.  Respiratory: Negative for cough and shortness of breath.   Cardiovascular: Negative for chest pain, palpitations and leg swelling.  Gastrointestinal: Negative for nausea, vomiting, abdominal pain, diarrhea, constipation, blood in stool and abdominal distention.  Genitourinary: Negative for dysuria, urgency, frequency, hematuria, flank pain, vaginal bleeding, vaginal discharge, difficulty urinating, vaginal pain and pelvic pain.  Musculoskeletal: Positive for joint swelling and arthralgias. Negative for gait problem.  Skin: Negative for rash.  Neurological: Negative for dizziness, syncope, speech difficulty, weakness, numbness and headaches.  Hematological: Negative for adenopathy.  Psychiatric/Behavioral: Negative for behavioral problems, dysphoric mood and agitation. The patient is not nervous/anxious.        Objective:   Physical Exam  Constitutional: She is oriented to person, place, and time. She appears well-developed and well-nourished.  HENT:  Head: Normocephalic.  Right Ear: External ear normal.  Left Ear:  External ear normal.  Mouth/Throat: Oropharynx is clear and moist.  Eyes: Conjunctivae and EOM are normal. Pupils are equal, round, and reactive to light.  Neck: Normal range of motion. Neck supple. No thyromegaly present.  Cardiovascular: Normal rate, regular rhythm and intact distal pulses.   Murmur heard.      Grade 3/6 systolic murmur loudest at the primary aortic area  Pulmonary/Chest: Effort normal and breath sounds normal.       Marked kyphoscoliosis  Abdominal: Soft. Bowel sounds are normal. She exhibits no mass. There is no tenderness.       Left inguinal hernia easily reducible  Musculoskeletal: Normal range of motion.  Lymphadenopathy:    She has no cervical adenopathy.  Neurological: She is alert and oriented to person, place, and time.  Skin: Skin is warm and dry. No rash noted.  Psychiatric: She has a normal mood and affect. Her behavior is normal.          Assessment & Plan:   Hypertension well controlled. Repeat blood pressure 130/80 Venous insufficiency stable Gastrointestinal she reflux disease DJD. We'll resume Voltaren. We'll recheck in 3 months and check electrolytes and renal indices at that time

## 2011-06-29 LAB — URINALYSIS, ROUTINE W REFLEX MICROSCOPIC
Hgb urine dipstick: NEGATIVE
Nitrite: POSITIVE — AB
Protein, ur: NEGATIVE
Urobilinogen, UA: 0.2

## 2011-06-29 LAB — BASIC METABOLIC PANEL
BUN: 22
Creatinine, Ser: 1.2
GFR calc non Af Amer: 43 — ABNORMAL LOW
Glucose, Bld: 102 — ABNORMAL HIGH
Potassium: 3.7

## 2011-06-29 LAB — CBC
HCT: 33.9 — ABNORMAL LOW
MCV: 90.6
Platelets: 204
RDW: 14.2

## 2011-06-29 LAB — URINE MICROSCOPIC-ADD ON

## 2011-06-29 LAB — URINE CULTURE: Colony Count: >=100000

## 2011-06-29 LAB — POCT CARDIAC MARKERS: CKMB, poc: 1.7

## 2011-07-11 ENCOUNTER — Encounter: Payer: Self-pay | Admitting: Internal Medicine

## 2011-07-11 ENCOUNTER — Ambulatory Visit: Payer: Federal, State, Local not specified - PPO | Admitting: Internal Medicine

## 2011-07-11 ENCOUNTER — Ambulatory Visit (INDEPENDENT_AMBULATORY_CARE_PROVIDER_SITE_OTHER): Payer: Medicare Other | Admitting: Internal Medicine

## 2011-07-11 DIAGNOSIS — Z23 Encounter for immunization: Secondary | ICD-10-CM

## 2011-07-11 DIAGNOSIS — M159 Polyosteoarthritis, unspecified: Secondary | ICD-10-CM

## 2011-07-11 DIAGNOSIS — I10 Essential (primary) hypertension: Secondary | ICD-10-CM

## 2011-07-11 DIAGNOSIS — Z136 Encounter for screening for cardiovascular disorders: Secondary | ICD-10-CM

## 2011-07-11 DIAGNOSIS — Z0000 Encounter for general adult medical examination without abnormal findings: Secondary | ICD-10-CM

## 2011-07-11 DIAGNOSIS — K219 Gastro-esophageal reflux disease without esophagitis: Secondary | ICD-10-CM

## 2011-07-11 DIAGNOSIS — D649 Anemia, unspecified: Secondary | ICD-10-CM

## 2011-07-11 LAB — CBC WITH DIFFERENTIAL/PLATELET
Basophils Absolute: 0 K/uL (ref 0.0–0.1)
Basophils Relative: 0.9 % (ref 0.0–3.0)
Eosinophils Absolute: 0.2 K/uL (ref 0.0–0.7)
Eosinophils Relative: 5.2 % — ABNORMAL HIGH (ref 0.0–5.0)
HCT: 37.2 % (ref 36.0–46.0)
Hemoglobin: 12.4 g/dL (ref 12.0–15.0)
Lymphocytes Relative: 26 % (ref 12.0–46.0)
Lymphs Abs: 1.2 K/uL (ref 0.7–4.0)
MCHC: 33.2 g/dL (ref 30.0–36.0)
MCV: 92.3 fl (ref 78.0–100.0)
Monocytes Absolute: 0.5 K/uL (ref 0.1–1.0)
Monocytes Relative: 10 % (ref 3.0–12.0)
Neutro Abs: 2.6 K/uL (ref 1.4–7.7)
Neutrophils Relative %: 57.9 % (ref 43.0–77.0)
Platelets: 184 K/uL (ref 150.0–400.0)
RBC: 4.03 Mil/uL (ref 3.87–5.11)
RDW: 14.8 % — ABNORMAL HIGH (ref 11.5–14.6)
WBC: 4.5 K/uL (ref 4.5–10.5)

## 2011-07-11 LAB — COMPREHENSIVE METABOLIC PANEL
AST: 21 U/L (ref 0–37)
Albumin: 4.1 g/dL (ref 3.5–5.2)
Alkaline Phosphatase: 92 U/L (ref 39–117)
Potassium: 4.5 mEq/L (ref 3.5–5.1)
Sodium: 141 mEq/L (ref 135–145)
Total Protein: 7.2 g/dL (ref 6.0–8.3)

## 2011-07-11 MED ORDER — DICLOFENAC SODIUM 50 MG PO TBEC: 50.0000 mg | DELAYED_RELEASE_TABLET | Freq: Two times a day (BID) | ORAL | Status: DC

## 2011-07-11 MED ORDER — QUINAPRIL HCL 40 MG PO TABS: 40.0000 mg | ORAL_TABLET | Freq: Every day | ORAL | Status: DC

## 2011-07-11 MED ORDER — TRAMADOL HCL 50 MG PO TABS: 50.0000 mg | ORAL_TABLET | Freq: Four times a day (QID) | ORAL | Status: DC | PRN

## 2011-07-11 MED ORDER — FUROSEMIDE 40 MG PO TABS: 40.0000 mg | ORAL_TABLET | Freq: Every day | ORAL | Status: DC

## 2011-07-11 MED ORDER — POLYETHYLENE GLYCOL 3350 17 GM/SCOOP PO POWD: 17.0000 g | Freq: Two times a day (BID) | ORAL | Status: DC

## 2011-07-11 MED ORDER — OMEPRAZOLE 40 MG PO CPDR: 40.0000 mg | DELAYED_RELEASE_CAPSULE | Freq: Every day | ORAL | Status: DC

## 2011-07-11 MED ORDER — POTASSIUM CHLORIDE CRYS ER 20 MEQ PO TBCR: 20.0000 meq | EXTENDED_RELEASE_TABLET | Freq: Every day | ORAL | Status: DC

## 2011-07-11 NOTE — Progress Notes (Signed)
Subjective:    Patient ID: Erin Gibson, female    DOB: 1919-06-10, 77 y.o.   MRN: [id number]  HPI and in no-year-old patient who is seen today for a wellness exam. Medical problems include treated hypertension. She has done quite well on diuretic therapy as well as ACE inhibition. She has osteoarthritis as well as gastroesophageal reflux disease. She is doing quite well at 77 years old. No major concerns or complaints. She still lives independently. She does have a daughter in [place]   Here for Medicare AWV:   1. Risk factors based on Past M, S, F history: personal risk factors include peripheral vascular disease, hypertension, and age  9. Physical Activities: fairly sedentary but remains independent  3. Depression/mood: no history of depression  4. Hearing: on a mild hearing impairment  5. ADL's: remains independent in aspects of daily living  6. Fall Risk: moderate fall risk due to age  93. Home Safety: no problems identified  8. Height, weight, &visual acuity: patient has lost 7 inches in height compared to her maximal height. Has had bilateral cataract extraction surgeries with lens implantation  9. Counseling: follow mammogram scheduled next month  10. Labs ordered based on risk factors: laboratory profile will be reviewed  11. Referral Coordination in-none appropriate except for follow-up mammogram  12. Care Plan- heart healthy diet. Discussed  13. Cognitive Assessment-alert and oriented, with normal affect   Allergies:  1) ! Codeine  2) ! Ibuprofen  3) ! Cardizem   Past History:  Past Medical History:  Reviewed history from 03/31/2009 and no changes required.  GERD  Hypertension  DJD  Peripheral Vascular Occlusive dz  history of DVT and pulmonary embolism, 1998  carpal tunnel syndrome  unsteady gait  chronic venous insufficiency  weight loss  anemia  chronic kidney disease   Past Surgical History:  Carpal tunnel release  Cataract extraction  Hysterectomy  complete  Total knee replacement x 2  gravida 3, para two, abortus one  colonoscopy in February 2003  negative cryolite stress test November 2005  Inguinal herniorrhaphy   Family History:  Reviewed history from 04/24/2007 and no changes required.  Family History Other cancer-Colon, Breast, Prostate  Family History of Stroke M 1st degree relative <50  Fam hx MI  Fam hx CAD  4 brothers  3 sisters All deceased except for one brother  Social History:  Widowed  for 19 years a daughter lives in [place]. Continues to live independently.  Reviewed history from 12/15/2007 and no changes required.  Never Smoked  Retired   Review of Systems  Constitutional: Negative for fever, appetite change, fatigue and unexpected weight change.  HENT: Negative for hearing loss, ear pain, nosebleeds, congestion, sore throat, mouth sores, trouble swallowing, neck stiffness, dental problem, voice change, sinus pressure and tinnitus.   Eyes: Negative for photophobia, pain, redness and visual disturbance.  Respiratory: Negative for cough, chest tightness and shortness of breath.   Cardiovascular: Negative for chest pain, palpitations and leg swelling.  Gastrointestinal: Negative for nausea, vomiting, abdominal pain, diarrhea, constipation, blood in stool, abdominal distention and rectal pain.  Genitourinary: Negative for dysuria, urgency, frequency, hematuria, flank pain, vaginal bleeding, vaginal discharge, difficulty urinating, genital sores, vaginal pain, menstrual problem and pelvic pain.  Musculoskeletal: Positive for back pain and gait problem. Negative for arthralgias.  Skin: Negative for rash.  Neurological: Negative for dizziness, syncope, speech difficulty, weakness, light-headedness, numbness and headaches.  Hematological: Negative for adenopathy. Does not bruise/bleed easily.  Psychiatric/Behavioral: Negative for  suicidal ideas, behavioral problems, self-injury, dysphoric mood and agitation. The  patient is not nervous/anxious.        Objective:   Physical Exam  Constitutional: She is oriented to person, place, and time. She appears well-developed and well-nourished.  HENT:  Head: Normocephalic and atraumatic.  Right Ear: External ear normal.  Left Ear: External ear normal.  Mouth/Throat: Oropharynx is clear and moist.  Eyes: Conjunctivae and EOM are normal.  Neck: Normal range of motion. Neck supple. No JVD present. No thyromegaly present.  Cardiovascular: Normal rate, regular rhythm and intact distal pulses.   Murmur heard.      Grade 2/6 systolic murmur let us at the base with radiation to the right supraclavicular area  Pulmonary/Chest: Effort normal and breath sounds normal. She has no wheezes. She has no rales.       Kyphosis  Abdominal: Soft. Bowel sounds are normal. She exhibits no distension and no mass. There is no tenderness. There is no rebound and no guarding.  Musculoskeletal: Normal range of motion. She exhibits no edema and no tenderness.  Neurological: She is alert and oriented to person, place, and time. She has normal reflexes. No cranial nerve deficit. She exhibits normal muscle tone. Coordination normal.  Skin: Skin is warm and dry. No rash noted.  Psychiatric: She has a normal mood and affect. Her behavior is normal.          Assessment & Plan:  Preventive health examination Hypertension controlled Osteoarthritis. Reasonably stable;  permanent handicap placard completed

## 2011-07-11 NOTE — Patient Instructions (Signed)
Limit your sodium (Salt) intake  Return in 6 months for follow-up  

## 2011-10-01 ENCOUNTER — Telehealth: Payer: Self-pay | Admitting: Internal Medicine

## 2011-10-01 MED ORDER — POLYETHYLENE GLYCOL 3350 17 GM/SCOOP PO POWD: 17.0000 g | Freq: Two times a day (BID) | ORAL | Status: DC

## 2011-10-01 NOTE — Telephone Encounter (Signed)
On Polyethylene glycol. [facility] is requesting a 3 month supply (6 bottles). Only an 8 day supply was sent in. Please resend. Thanks.

## 2011-10-01 NOTE — Telephone Encounter (Signed)
New order sent.

## 2011-10-25 ENCOUNTER — Telehealth: Payer: Self-pay | Admitting: Internal Medicine

## 2011-10-25 NOTE — Telephone Encounter (Signed)
Chart opened in error

## 2011-11-30 DIAGNOSIS — Z1231 Encounter for screening mammogram for malignant neoplasm of breast: Secondary | ICD-10-CM | POA: Diagnosis not present

## 2011-12-03 ENCOUNTER — Encounter: Payer: Self-pay | Admitting: Internal Medicine

## 2012-01-09 ENCOUNTER — Ambulatory Visit (INDEPENDENT_AMBULATORY_CARE_PROVIDER_SITE_OTHER): Payer: Medicare Other | Admitting: Internal Medicine

## 2012-01-09 ENCOUNTER — Encounter: Payer: Self-pay | Admitting: Internal Medicine

## 2012-01-09 VITALS — BP 160/80 | Wt 116.0 lb

## 2012-01-09 DIAGNOSIS — D649 Anemia, unspecified: Secondary | ICD-10-CM

## 2012-01-09 DIAGNOSIS — M159 Polyosteoarthritis, unspecified: Secondary | ICD-10-CM | POA: Diagnosis not present

## 2012-01-09 DIAGNOSIS — R269 Unspecified abnormalities of gait and mobility: Secondary | ICD-10-CM | POA: Diagnosis not present

## 2012-01-09 DIAGNOSIS — I10 Essential (primary) hypertension: Secondary | ICD-10-CM | POA: Diagnosis not present

## 2012-01-09 DIAGNOSIS — R2681 Unsteadiness on feet: Secondary | ICD-10-CM

## 2012-01-09 NOTE — Patient Instructions (Signed)
Physical therapy as discussed  Take a calcium supplement, plus [phone] units of vitamin D    It is important that you exercise regularly, at least 20 minutes 3 to 4 times per week.  If you develop chest pain or shortness of breath seek  medical attention.  Return in 6 months for follow-up

## 2012-01-09 NOTE — Progress Notes (Signed)
  Subjective:    Patient ID: Erin Gibson, female    DOB: [birth year], 78 y.o.   MRN: [id number]  HPI  78 year old patient who is in today for followup. She has a history of hypertension which has been controlled on diuretic therapy as well as Accupril. She is 78 years old and has been evaluated due to recent frequent falls. She has been evaluated by physical therapy and thought to be an excellent candidate for gait training. This will be scheduled She has a history of anemia laboratory studies were checked in the fall and were unremarkable. Medical regimen does include Voltaren for osteoarthritis. In general done quite well no history of melena. She does have a history of hypoglycemia 2 years ago    Review of Systems  Constitutional: Negative.   HENT: Negative for hearing loss, congestion, sore throat, rhinorrhea, dental problem, sinus pressure and tinnitus.   Eyes: Negative for pain, discharge and visual disturbance.  Respiratory: Negative for cough and shortness of breath.   Cardiovascular: Positive for chest pain. Negative for palpitations and leg swelling.  Gastrointestinal: Negative for nausea, vomiting, abdominal pain, diarrhea, constipation, blood in stool and abdominal distention.  Genitourinary: Negative for dysuria, urgency, frequency, hematuria, flank pain, vaginal bleeding, vaginal discharge, difficulty urinating, vaginal pain and pelvic pain.  Musculoskeletal: Positive for arthralgias and gait problem. Negative for joint swelling.  Skin: Negative for rash.  Neurological: Negative for dizziness, syncope, speech difficulty, weakness, numbness and headaches.  Hematological: Negative for adenopathy.  Psychiatric/Behavioral: Negative for behavioral problems, dysphoric mood and agitation. The patient is not nervous/anxious.        Objective:   Physical Exam  Constitutional: She is oriented to person, place, and time. She appears well-developed and well-nourished.       Alert  elderly frail no distress Blood pressure 155/80  HENT:  Head: Normocephalic.  Right Ear: External ear normal.  Left Ear: External ear normal.  Mouth/Throat: Oropharynx is clear and moist.  Eyes: Conjunctivae and EOM are normal. Pupils are equal, round, and reactive to light.  Neck: Normal range of motion. Neck supple. No thyromegaly present.  Cardiovascular: Normal rate, regular rhythm, normal heart sounds and intact distal pulses.   Pulmonary/Chest: Effort normal and breath sounds normal. No respiratory distress. She has no wheezes. She has no rales.       Marked kyphoscoliosis  Abdominal: Soft. Bowel sounds are normal. She exhibits no mass. There is no tenderness.  Musculoskeletal: Normal range of motion. She exhibits no edema.       Compression hose in place  Lymphadenopathy:    She has no cervical adenopathy.  Neurological: She is alert and oriented to person, place, and time.  Skin: Skin is warm and dry. No rash noted.  Psychiatric: She has a normal mood and affect. Her behavior is normal.          Assessment & Plan:   Osteoarthritis History of anemia stable History venous stasis ulcer disease stable Gait instability. Will schedule a PT consult for gait training Hypertension reasonable control. No change therapy

## 2012-01-16 ENCOUNTER — Ambulatory Visit: Payer: Medicare Other | Attending: Internal Medicine | Admitting: Physical Therapy

## 2012-01-16 DIAGNOSIS — R269 Unspecified abnormalities of gait and mobility: Secondary | ICD-10-CM | POA: Insufficient documentation

## 2012-01-16 DIAGNOSIS — IMO0001 Reserved for inherently not codable concepts without codable children: Secondary | ICD-10-CM | POA: Insufficient documentation

## 2012-01-16 DIAGNOSIS — M545 Low back pain, unspecified: Secondary | ICD-10-CM | POA: Diagnosis not present

## 2012-01-16 DIAGNOSIS — M6281 Muscle weakness (generalized): Secondary | ICD-10-CM | POA: Insufficient documentation

## 2012-01-17 ENCOUNTER — Ambulatory Visit: Payer: Medicare Other | Admitting: Physical Therapy

## 2012-01-21 ENCOUNTER — Ambulatory Visit: Payer: Medicare Other | Admitting: Physical Therapy

## 2012-01-23 ENCOUNTER — Ambulatory Visit: Payer: Medicare Other | Attending: Internal Medicine | Admitting: Physical Therapy

## 2012-01-23 DIAGNOSIS — M545 Low back pain, unspecified: Secondary | ICD-10-CM | POA: Insufficient documentation

## 2012-01-23 DIAGNOSIS — R269 Unspecified abnormalities of gait and mobility: Secondary | ICD-10-CM | POA: Diagnosis not present

## 2012-01-23 DIAGNOSIS — IMO0001 Reserved for inherently not codable concepts without codable children: Secondary | ICD-10-CM | POA: Insufficient documentation

## 2012-01-23 DIAGNOSIS — M6281 Muscle weakness (generalized): Secondary | ICD-10-CM | POA: Insufficient documentation

## 2012-01-28 ENCOUNTER — Ambulatory Visit: Payer: Medicare Other | Admitting: Physical Therapy

## 2012-01-28 DIAGNOSIS — M545 Low back pain: Secondary | ICD-10-CM | POA: Diagnosis not present

## 2012-01-28 DIAGNOSIS — M6281 Muscle weakness (generalized): Secondary | ICD-10-CM | POA: Diagnosis not present

## 2012-01-28 DIAGNOSIS — IMO0001 Reserved for inherently not codable concepts without codable children: Secondary | ICD-10-CM | POA: Diagnosis not present

## 2012-01-28 DIAGNOSIS — R269 Unspecified abnormalities of gait and mobility: Secondary | ICD-10-CM | POA: Diagnosis not present

## 2012-01-30 ENCOUNTER — Ambulatory Visit: Payer: Medicare Other | Admitting: Physical Therapy

## 2012-01-30 DIAGNOSIS — M6281 Muscle weakness (generalized): Secondary | ICD-10-CM | POA: Diagnosis not present

## 2012-01-30 DIAGNOSIS — IMO0001 Reserved for inherently not codable concepts without codable children: Secondary | ICD-10-CM | POA: Diagnosis not present

## 2012-01-30 DIAGNOSIS — M545 Low back pain: Secondary | ICD-10-CM | POA: Diagnosis not present

## 2012-01-30 DIAGNOSIS — R269 Unspecified abnormalities of gait and mobility: Secondary | ICD-10-CM | POA: Diagnosis not present

## 2012-02-05 ENCOUNTER — Ambulatory Visit: Payer: Medicare Other | Admitting: Physical Therapy

## 2012-02-05 DIAGNOSIS — M545 Low back pain: Secondary | ICD-10-CM | POA: Diagnosis not present

## 2012-02-05 DIAGNOSIS — IMO0001 Reserved for inherently not codable concepts without codable children: Secondary | ICD-10-CM | POA: Diagnosis not present

## 2012-02-05 DIAGNOSIS — R269 Unspecified abnormalities of gait and mobility: Secondary | ICD-10-CM | POA: Diagnosis not present

## 2012-02-05 DIAGNOSIS — M6281 Muscle weakness (generalized): Secondary | ICD-10-CM | POA: Diagnosis not present

## 2012-02-08 ENCOUNTER — Ambulatory Visit: Payer: Medicare Other | Admitting: Physical Therapy

## 2012-02-08 DIAGNOSIS — R269 Unspecified abnormalities of gait and mobility: Secondary | ICD-10-CM | POA: Diagnosis not present

## 2012-02-08 DIAGNOSIS — IMO0001 Reserved for inherently not codable concepts without codable children: Secondary | ICD-10-CM | POA: Diagnosis not present

## 2012-02-08 DIAGNOSIS — M545 Low back pain: Secondary | ICD-10-CM | POA: Diagnosis not present

## 2012-02-08 DIAGNOSIS — M6281 Muscle weakness (generalized): Secondary | ICD-10-CM | POA: Diagnosis not present

## 2012-02-11 ENCOUNTER — Ambulatory Visit: Payer: Medicare Other | Admitting: Physical Therapy

## 2012-02-13 ENCOUNTER — Ambulatory Visit: Payer: Medicare Other | Admitting: Physical Therapy

## 2012-02-20 ENCOUNTER — Ambulatory Visit: Payer: Medicare Other | Admitting: Physical Therapy

## 2012-05-19 ENCOUNTER — Other Ambulatory Visit: Payer: Self-pay | Admitting: Internal Medicine

## 2012-06-18 ENCOUNTER — Other Ambulatory Visit: Payer: Self-pay | Admitting: Internal Medicine

## 2012-07-11 ENCOUNTER — Encounter: Payer: Federal, State, Local not specified - PPO | Admitting: Internal Medicine

## 2012-07-21 ENCOUNTER — Ambulatory Visit (INDEPENDENT_AMBULATORY_CARE_PROVIDER_SITE_OTHER): Payer: Medicare Other | Admitting: Internal Medicine

## 2012-07-21 ENCOUNTER — Encounter: Payer: Self-pay | Admitting: Internal Medicine

## 2012-07-21 VITALS — BP 130/82 | HR 68 | Temp 97.8°F | Resp 18 | Ht 60.0 in | Wt 117.0 lb

## 2012-07-21 DIAGNOSIS — M159 Polyosteoarthritis, unspecified: Secondary | ICD-10-CM

## 2012-07-21 DIAGNOSIS — K219 Gastro-esophageal reflux disease without esophagitis: Secondary | ICD-10-CM | POA: Diagnosis not present

## 2012-07-21 DIAGNOSIS — Z0000 Encounter for general adult medical examination without abnormal findings: Secondary | ICD-10-CM | POA: Diagnosis not present

## 2012-07-21 DIAGNOSIS — D649 Anemia, unspecified: Secondary | ICD-10-CM

## 2012-07-21 DIAGNOSIS — I10 Essential (primary) hypertension: Secondary | ICD-10-CM | POA: Diagnosis not present

## 2012-07-21 DIAGNOSIS — Z23 Encounter for immunization: Secondary | ICD-10-CM

## 2012-07-21 LAB — CBC WITH DIFFERENTIAL/PLATELET
Eosinophils Absolute: 0.3 10*3/uL (ref 0.0–0.7)
Eosinophils Relative: 6.1 % — ABNORMAL HIGH (ref 0.0–5.0)
Lymphocytes Relative: 24.6 % (ref 12.0–46.0)
MCV: 93.3 fl (ref 78.0–100.0)
Monocytes Absolute: 0.5 10*3/uL (ref 0.1–1.0)
Neutrophils Relative %: 59 % (ref 43.0–77.0)
Platelets: 211 10*3/uL (ref 150.0–400.0)
WBC: 5.6 10*3/uL (ref 4.5–10.5)

## 2012-07-21 LAB — COMPREHENSIVE METABOLIC PANEL
Albumin: 3.5 g/dL (ref 3.5–5.2)
Alkaline Phosphatase: 93 U/L (ref 39–117)
Calcium: 9.4 mg/dL (ref 8.4–10.5)
Chloride: 108 mEq/L (ref 96–112)
Glucose, Bld: 79 mg/dL (ref 70–99)
Potassium: 4.5 mEq/L (ref 3.5–5.1)
Sodium: 140 mEq/L (ref 135–145)
Total Protein: 6.6 g/dL (ref 6.0–8.3)

## 2012-07-21 MED ORDER — OMEPRAZOLE 40 MG PO CPDR: 40.0000 mg | DELAYED_RELEASE_CAPSULE | Freq: Every day | ORAL | Status: DC

## 2012-07-21 MED ORDER — DICLOFENAC SODIUM 50 MG PO TBEC: 50.0000 mg | DELAYED_RELEASE_TABLET | Freq: Two times a day (BID) | ORAL | Status: DC

## 2012-07-21 MED ORDER — POTASSIUM CHLORIDE CRYS ER 20 MEQ PO TBCR: 20.0000 meq | EXTENDED_RELEASE_TABLET | Freq: Every day | ORAL | Status: DC

## 2012-07-21 MED ORDER — TRAMADOL HCL 50 MG PO TABS: 50.0000 mg | ORAL_TABLET | Freq: Three times a day (TID) | ORAL | Status: DC | PRN

## 2012-07-21 MED ORDER — QUINAPRIL HCL 40 MG PO TABS: 40.0000 mg | ORAL_TABLET | Freq: Every day | ORAL | Status: DC

## 2012-07-21 MED ORDER — FUROSEMIDE 40 MG PO TABS: 40.0000 mg | ORAL_TABLET | Freq: Every day | ORAL | Status: DC

## 2012-07-21 NOTE — Progress Notes (Signed)
Patient ID: Erin Gibson, female   DOB: [birth year], 78 y.o.   MRN: [id number]  Subjective:    Patient ID: Erin Gibson, female    DOB: Mar 16, 1919, 78 y.o.   MRN: [id number]  HPI  78 year-old patient who is seen today for a wellness exam. Medical problems include treated hypertension. She has done quite well on diuretic therapy as well as ACE inhibition. She has osteoarthritis as well as gastroesophageal reflux disease. She is doing quite well at 78 years old. No major concerns or complaints. She still lives independently. She does have a daughter in [place]   Here for Medicare AWV:   1. Risk factors based on Past M, S, F history: personal risk factors include peripheral vascular disease, hypertension, and age  71. Physical Activities: fairly sedentary but remains independent  3. Depression/mood: no history of depression  4. Hearing: on a mild hearing impairment  5. ADL's: remains independent in aspects of daily living  6. Fall Risk: moderate fall risk due to age  714. Home Safety: no problems identified  8. Height, weight, &visual acuity: patient has lost 7 inches in height compared to her maximal height. Has had bilateral cataract extraction surgeries with lens implantation  9. Counseling: follow mammogram scheduled next month  10. Labs ordered based on risk factors: laboratory profile will be reviewed  11. Referral Coordination in-none appropriate except for follow-up mammogram  12. Care Plan- heart healthy diet. Discussed  13. Cognitive Assessment-alert and oriented, with normal affect   Allergies:  1) ! Codeine  2) ! Ibuprofen  3) ! Cardizem   Past History:  Past Medical History:  Reviewed history from 03/31/2009 and no changes required.  GERD  Hypertension  DJD  Peripheral Vascular Occlusive dz  history of DVT and pulmonary embolism, 1998  carpal tunnel syndrome  unsteady gait  chronic venous insufficiency  weight loss  anemia  chronic kidney disease   Past Surgical  History:  Carpal tunnel release  Cataract extraction  Hysterectomy complete  Total knee replacement x 2  gravida 3, para two, abortus one  colonoscopy in February 2003  negative cryolite stress test November 2005  Inguinal herniorrhaphy   Family History:  Reviewed history from 04/24/2007 and no changes required.  Family History Other cancer-Colon, Breast, Prostate  Family History of Stroke M 1st degree relative <50  Fam hx MI  Fam hx CAD  4 brothers  3 sisters All deceased except for one brother  Social History:  Widowed  for 19 years a daughter lives in [place]. Continues to live independently.  Reviewed history from 12/15/2007 and no changes required.  Never Smoked  Retired   Review of Systems  Constitutional: Negative for fever, appetite change, fatigue and unexpected weight change.  HENT: Negative for hearing loss, ear pain, nosebleeds, congestion, sore throat, mouth sores, trouble swallowing, neck stiffness, dental problem, voice change, sinus pressure and tinnitus.   Eyes: Negative for photophobia, pain, redness and visual disturbance.  Respiratory: Negative for cough, chest tightness and shortness of breath.   Cardiovascular: Negative for chest pain, palpitations and leg swelling.  Gastrointestinal: Negative for nausea, vomiting, abdominal pain, diarrhea, constipation, blood in stool, abdominal distention and rectal pain.  Genitourinary: Negative for dysuria, urgency, frequency, hematuria, flank pain, vaginal bleeding, vaginal discharge, difficulty urinating, genital sores, vaginal pain, menstrual problem and pelvic pain.  Musculoskeletal: Positive for back pain and gait problem. Negative for arthralgias.  Skin: Negative for rash.  Neurological: Negative for dizziness, syncope, speech difficulty, weakness,  light-headedness, numbness and headaches.  Hematological: Negative for adenopathy. Does not bruise/bleed easily.  Psychiatric/Behavioral: Negative for suicidal ideas,  behavioral problems, self-injury, dysphoric mood and agitation. The patient is not nervous/anxious.        Objective:   Physical Exam  Constitutional: She is oriented to person, place, and time. She appears well-developed and well-nourished.  HENT:  Head: Normocephalic and atraumatic.  Right Ear: External ear normal.  Left Ear: External ear normal.  Mouth/Throat: Oropharynx is clear and moist.  Eyes: Conjunctivae normal and EOM are normal.  Neck: Normal range of motion. Neck supple. No JVD present. No thyromegaly present.  Cardiovascular: Normal rate, regular rhythm and intact distal pulses.   Murmur heard.      Grade 2/6 systolic murmur let us at the base with radiation to the right supraclavicular area  Pulmonary/Chest: Effort normal and breath sounds normal. She has no wheezes. She has no rales.       Kyphosis  Abdominal: Soft. Bowel sounds are normal. She exhibits no distension and no mass. There is no tenderness. There is no rebound and no guarding.  Musculoskeletal: Normal range of motion. She exhibits no edema and no tenderness.  Neurological: She is alert and oriented to person, place, and time. She has normal reflexes. No cranial nerve deficit. She exhibits normal muscle tone. Coordination normal.  Skin: Skin is warm and dry. No rash noted.  Psychiatric: She has a normal mood and affect. Her behavior is normal.          Assessment & Plan:  Preventive health examination Hypertension controlled Osteoarthritis. Reasonably stable;  permanent handicap placard completed

## 2012-07-21 NOTE — Patient Instructions (Signed)
Limit your sodium (Salt) intake Take a calcium supplement, plus 800-1200 units of vitamin D  Return in 6 months for follow-up  

## 2012-07-29 ENCOUNTER — Telehealth: Payer: Self-pay | Admitting: Internal Medicine

## 2012-07-29 MED ORDER — POLYETHYLENE GLYCOL 3350 17 GM/SCOOP PO POWD: 17.0000 g | Freq: Two times a day (BID) | ORAL | Status: DC

## 2012-07-29 NOTE — Telephone Encounter (Signed)
Caller: Wednesday/Patient; Patient Name: Erin Gibson; PCP: Eleonore Chiquito [facility]); Best Callback Phone Number: [phone].   Patient calling about Miralax Rx.  States when Dr. Amador Cunas ordered her prescriptions with [facility], he apparently did not confirm this one.  Did order her other medications without issue.  States she cannot do without this particular prescription.  Declines triage.  Info to office for provider review/Rx/callback.   May reach [facility] [phone].   May reach patient at [phone].

## 2012-08-13 ENCOUNTER — Other Ambulatory Visit: Payer: Self-pay | Admitting: *Deleted

## 2012-08-13 MED ORDER — POLYETHYLENE GLYCOL 3350 17 GM/SCOOP PO POWD: 17.0000 g | Freq: Two times a day (BID) | ORAL | Status: DC

## 2012-08-13 NOTE — Telephone Encounter (Signed)
Rx refill done for 90 day supply Polyeth Glyc POW [id number] sent to [facility].

## 2012-09-12 ENCOUNTER — Encounter: Payer: Federal, State, Local not specified - PPO | Admitting: Internal Medicine

## 2012-12-12 DIAGNOSIS — Z1231 Encounter for screening mammogram for malignant neoplasm of breast: Secondary | ICD-10-CM | POA: Diagnosis not present

## 2012-12-16 DIAGNOSIS — H3530 Unspecified macular degeneration: Secondary | ICD-10-CM | POA: Diagnosis not present

## 2012-12-16 DIAGNOSIS — H18419 Arcus senilis, unspecified eye: Secondary | ICD-10-CM | POA: Diagnosis not present

## 2012-12-16 DIAGNOSIS — H43399 Other vitreous opacities, unspecified eye: Secondary | ICD-10-CM | POA: Diagnosis not present

## 2012-12-16 DIAGNOSIS — H26499 Other secondary cataract, unspecified eye: Secondary | ICD-10-CM | POA: Diagnosis not present

## 2013-01-19 ENCOUNTER — Ambulatory Visit (INDEPENDENT_AMBULATORY_CARE_PROVIDER_SITE_OTHER): Payer: Medicare Other | Admitting: Internal Medicine

## 2013-01-19 ENCOUNTER — Other Ambulatory Visit: Payer: Self-pay | Admitting: *Deleted

## 2013-01-19 ENCOUNTER — Encounter: Payer: Self-pay | Admitting: Internal Medicine

## 2013-01-19 VITALS — BP 166/80 | HR 61 | Temp 98.6°F | Resp 18 | Wt 116.0 lb

## 2013-01-19 DIAGNOSIS — M159 Polyosteoarthritis, unspecified: Secondary | ICD-10-CM | POA: Diagnosis not present

## 2013-01-19 DIAGNOSIS — I10 Essential (primary) hypertension: Secondary | ICD-10-CM | POA: Diagnosis not present

## 2013-01-19 DIAGNOSIS — D649 Anemia, unspecified: Secondary | ICD-10-CM | POA: Diagnosis not present

## 2013-01-19 LAB — CBC WITH DIFFERENTIAL/PLATELET
Basophils Relative: 1.2 % (ref 0.0–3.0)
Eosinophils Relative: 4.9 % (ref 0.0–5.0)
Lymphocytes Relative: 24.6 % (ref 12.0–46.0)
MCV: 85.6 fl (ref 78.0–100.0)
Neutrophils Relative %: 58.3 % (ref 43.0–77.0)
RBC: 3.84 Mil/uL — ABNORMAL LOW (ref 3.87–5.11)
WBC: 4.5 10*3/uL (ref 4.5–10.5)

## 2013-01-19 LAB — BASIC METABOLIC PANEL
BUN: 26 mg/dL — ABNORMAL HIGH (ref 6–23)
Chloride: 107 mEq/L (ref 96–112)
Glucose, Bld: 79 mg/dL (ref 70–99)
Potassium: 4.5 mEq/L (ref 3.5–5.1)

## 2013-01-19 MED ORDER — QUINAPRIL HCL 40 MG PO TABS: 40.0000 mg | ORAL_TABLET | Freq: Every day | ORAL | Status: DC

## 2013-01-19 NOTE — Progress Notes (Signed)
Subjective:    Patient ID: Erin Gibson, female    DOB: 29-Jul-1919, 79 y.o.   MRN: [id number]  HPI  79 year old patient who is seen today for her six-month followup. Medical problems include treated hypertension and osteoarthritis. Complaints today include chronic fatigue. She has a history of chronic diclofenac use.  She remains on iron supplementation and does have a history of anemia. Laboratory studies were done 6 months ago and were fairly unremarkable.  Past Medical History  Diagnosis Date  . ANEMIA 03/31/2009  . DEGENERATIVE JOINT DISEASE, GENERALIZED 05/06/2007  . GERD 04/24/2007  . HYPERTENSION 04/24/2007  . VENOUS STASIS ULCER 07/20/2008  . DVT (deep venous thrombosis)     hx of  . Chronic kidney disease     History   Social History  . Marital Status: Widowed    Spouse Name: N/A    Number of Children: N/A  . Years of Education: N/A   Occupational History  . Not on file.   Social History Main Topics  . Smoking status: Never Smoker   . Smokeless tobacco: Never Used  . Alcohol Use: No  . Drug Use: No  . Sexually Active: Not on file   Other Topics Concern  . Not on file   Social History Narrative  . No narrative on file    Past Surgical History  Procedure Laterality Date  . Cataract extraction    . Abdominal hysterectomy    . Total knee arthroplasty      x2  . Hernia repair      No family history on file.  Allergies  Allergen Reactions  . Codeine   . Diltiazem Hcl   . Ibuprofen     Current Outpatient Prescriptions on File Prior to Visit  Medication Sig Dispense Refill  . aspirin 81 MG tablet Take 81 mg by mouth daily.        . diclofenac (VOLTAREN) 50 MG EC tablet Take 1 tablet (50 mg total) by mouth 2 (two) times daily.  180 tablet  1  . furosemide (LASIX) 40 MG tablet Take 1 tablet (40 mg total) by mouth daily.  90 tablet  6  . iron polysaccharides (NIFEREX) 150 MG capsule Take 150 mg by mouth daily.        . meclizine (ANTIVERT) 25 MG tablet  Take 25 mg by mouth 3 (three) times daily as needed.        Marland Kitchen omeprazole (PRILOSEC) 40 MG capsule Take 1 capsule (40 mg total) by mouth daily.  90 capsule  6  . polyethylene glycol powder (GLYCOLAX/MIRALAX) powder Take 17 g by mouth 2 (two) times daily.  2550 g  4  . potassium chloride SA (K-DUR,KLOR-CON) 20 MEQ tablet Take 1 tablet (20 mEq total) by mouth daily.  90 tablet  6  . traMADol (ULTRAM) 50 MG tablet Take 1 tablet (50 mg total) by mouth every 8 (eight) hours as needed for pain.  90 tablet  1  . vitamin B-12 (CYANOCOBALAMIN) 1000 MCG tablet Take 1,000 mcg by mouth daily.        . vitamin E (VITAMIN E) 400 UNIT capsule Take 400 Units by mouth daily.         No current facility-administered medications on file prior to visit.    BP 166/80  Pulse 61  Temp(Src) 98.6 F (37 C) (Oral)  Resp 18  Wt 116 lb (52.617 kg)  BMI 22.65 kg/m2  SpO2 97%       Review of  Systems  Constitutional: Positive for fatigue.  HENT: Negative for hearing loss, congestion, sore throat, rhinorrhea, dental problem, sinus pressure and tinnitus.   Eyes: Negative for pain, discharge and visual disturbance.  Respiratory: Negative for cough and shortness of breath.   Cardiovascular: Negative for chest pain, palpitations and leg swelling.  Gastrointestinal: Negative for nausea, vomiting, abdominal pain, diarrhea, constipation, blood in stool and abdominal distention.  Genitourinary: Negative for dysuria, urgency, frequency, hematuria, flank pain, vaginal bleeding, vaginal discharge, difficulty urinating, vaginal pain and pelvic pain.  Musculoskeletal: Positive for back pain and arthralgias. Negative for joint swelling and gait problem.  Skin: Negative for rash.  Neurological: Negative for dizziness, syncope, speech difficulty, weakness, numbness and headaches.  Hematological: Negative for adenopathy.  Psychiatric/Behavioral: Negative for behavioral problems, dysphoric mood and agitation. The patient is not  nervous/anxious.        Objective:   Physical Exam  Constitutional: She is oriented to person, place, and time. She appears well-developed and well-nourished.  HENT:  Head: Normocephalic.  Right Ear: External ear normal.  Left Ear: External ear normal.  Mouth/Throat: Oropharynx is clear and moist.  Eyes: Conjunctivae and EOM are normal. Pupils are equal, round, and reactive to light.  Neck: Normal range of motion. Neck supple. No thyromegaly present.  Cardiovascular: Normal rate, regular rhythm and intact distal pulses.   Murmur heard. Grade 2/6 systolic murmur at the base  Pulmonary/Chest: Effort normal and breath sounds normal.  Scoliosis  Abdominal: Soft. Bowel sounds are normal. She exhibits no mass. There is no tenderness.  Musculoskeletal: Normal range of motion.  Lymphadenopathy:    She has no cervical adenopathy.  Neurological: She is alert and oriented to person, place, and time.  Skin: Skin is warm and dry. No rash noted.  Psychiatric: She has a normal mood and affect. Her behavior is normal.          Assessment & Plan:   Hypertension reasonable control with only mild systolic elevation Osteoarthritis Fatigue. This seems chronic and unchanged we'll check a CBC and bmet  CPX 6 months

## 2013-01-19 NOTE — Patient Instructions (Signed)
Limit your sodium (Salt) intake  Return in 6 months for follow-up  

## 2013-02-18 ENCOUNTER — Ambulatory Visit (INDEPENDENT_AMBULATORY_CARE_PROVIDER_SITE_OTHER): Payer: Medicare Other | Admitting: Internal Medicine

## 2013-02-18 ENCOUNTER — Encounter: Payer: Self-pay | Admitting: Internal Medicine

## 2013-02-18 VITALS — BP 160/80 | HR 76 | Temp 98.3°F | Resp 20 | Wt 112.0 lb

## 2013-02-18 DIAGNOSIS — I10 Essential (primary) hypertension: Secondary | ICD-10-CM

## 2013-02-18 DIAGNOSIS — M159 Polyosteoarthritis, unspecified: Secondary | ICD-10-CM | POA: Diagnosis not present

## 2013-02-18 DIAGNOSIS — D649 Anemia, unspecified: Secondary | ICD-10-CM | POA: Diagnosis not present

## 2013-02-18 LAB — CBC WITH DIFFERENTIAL/PLATELET
Basophils Absolute: 0 10*3/uL (ref 0.0–0.1)
Eosinophils Relative: 2.9 % (ref 0.0–5.0)
HCT: 34.4 % — ABNORMAL LOW (ref 36.0–46.0)
Lymphs Abs: 1.3 10*3/uL (ref 0.7–4.0)
MCHC: 32.9 g/dL (ref 30.0–36.0)
MCV: 85.2 fl (ref 78.0–100.0)
Monocytes Absolute: 0.6 10*3/uL (ref 0.1–1.0)
Platelets: 213 10*3/uL (ref 150.0–400.0)
RDW: 15.2 % — ABNORMAL HIGH (ref 11.5–14.6)

## 2013-02-18 NOTE — Progress Notes (Signed)
Subjective:    Patient ID: Erin Gibson, female    DOB: March 25, 1919, 79 y.o.   MRN: [id number]  HPI  79 year old patient who is seen today in followup. She was seen one month ago and noted to have moderate anemia. Medical regimen included daily anti-inflammatory medications. This was discontinued one month ago and the patient was maintained on daily omeprazole. She was placed on iron supplementation. She complains of some chronic fatigue but in general has been fairly stable. She has treated hypertension which has been well-controlled.  Past Medical History  Diagnosis Date  . ANEMIA 03/31/2009  . DEGENERATIVE JOINT DISEASE, GENERALIZED 05/06/2007  . GERD 04/24/2007  . HYPERTENSION 04/24/2007  . VENOUS STASIS ULCER 07/20/2008  . DVT (deep venous thrombosis)     hx of  . Chronic kidney disease     History   Social History  . Marital Status: Widowed    Spouse Name: N/A    Number of Children: N/A  . Years of Education: N/A   Occupational History  . Not on file.   Social History Main Topics  . Smoking status: Never Smoker   . Smokeless tobacco: Never Used  . Alcohol Use: No  . Drug Use: No  . Sexually Active: Not on file   Other Topics Concern  . Not on file   Social History Narrative  . No narrative on file    Past Surgical History  Procedure Laterality Date  . Cataract extraction    . Abdominal hysterectomy    . Total knee arthroplasty      x2  . Hernia repair      No family history on file.  Allergies  Allergen Reactions  . Codeine   . Diltiazem Hcl   . Ibuprofen     Current Outpatient Prescriptions on File Prior to Visit  Medication Sig Dispense Refill  . aspirin 81 MG tablet Take 81 mg by mouth daily.        . furosemide (LASIX) 40 MG tablet Take 1 tablet (40 mg total) by mouth daily.  90 tablet  6  . iron polysaccharides (NIFEREX) 150 MG capsule Take 150 mg by mouth daily.        . meclizine (ANTIVERT) 25 MG tablet Take 25 mg by mouth 3 (three) times  daily as needed.        Marland Kitchen omeprazole (PRILOSEC) 40 MG capsule Take 1 capsule (40 mg total) by mouth daily.  90 capsule  6  . polyethylene glycol powder (GLYCOLAX/MIRALAX) powder Take 17 g by mouth 2 (two) times daily.  2550 g  4  . potassium chloride SA (K-DUR,KLOR-CON) 20 MEQ tablet Take 1 tablet (20 mEq total) by mouth daily.  90 tablet  6  . quinapril (ACCUPRIL) 40 MG tablet Take 1 tablet (40 mg total) by mouth daily.  90 tablet  3  . traMADol (ULTRAM) 50 MG tablet Take 1 tablet (50 mg total) by mouth every 8 (eight) hours as needed for pain.  90 tablet  1  . vitamin B-12 (CYANOCOBALAMIN) 1000 MCG tablet Take 1,000 mcg by mouth daily.        . vitamin E (VITAMIN E) 400 UNIT capsule Take 400 Units by mouth daily.         No current facility-administered medications on file prior to visit.    BP 160/80  Pulse 76  Temp(Src) 98.3 F (36.8 C) (Oral)  Resp 20  Wt 112 lb (50.803 kg)  BMI 21.87 kg/m2  SpO2  96%       Review of Systems  Constitutional: Positive for fatigue.  HENT: Negative for hearing loss, congestion, sore throat, rhinorrhea, dental problem, sinus pressure and tinnitus.   Eyes: Negative for pain, discharge and visual disturbance.  Respiratory: Negative for cough and shortness of breath.   Cardiovascular: Positive for leg swelling. Negative for chest pain and palpitations.  Gastrointestinal: Negative for nausea, vomiting, abdominal pain, diarrhea, constipation, blood in stool and abdominal distention.  Genitourinary: Negative for dysuria, urgency, frequency, hematuria, flank pain, vaginal bleeding, vaginal discharge, difficulty urinating, vaginal pain and pelvic pain.  Musculoskeletal: Negative for joint swelling, arthralgias and gait problem.  Skin: Negative for rash.  Neurological: Negative for dizziness, syncope, speech difficulty, weakness, numbness and headaches.  Hematological: Negative for adenopathy.  Psychiatric/Behavioral: Negative for behavioral problems,  dysphoric mood and agitation. The patient is not nervous/anxious.        Objective:   Physical Exam  Constitutional: She appears well-developed and well-nourished. No distress.  Musculoskeletal: She exhibits edema.          Assessment & Plan:   Anemia probably secondary to NSAID-induced upper GI bleeding. We'll check a CBC today and continue iron supplementation. Anti-inflammatory medications will be permanently discontinued Osteoarthritis. Seems to be managing well on tramadol and Tylenol  Recheck 3 months

## 2013-02-18 NOTE — Patient Instructions (Addendum)
Continue iron therapy  Return in 6 months for follow-up

## 2013-02-23 ENCOUNTER — Telehealth: Payer: Self-pay | Admitting: Internal Medicine

## 2013-02-23 NOTE — Telephone Encounter (Signed)
Please see labs from 5/28 and advise whether pt can go back on anti-inflammatory meds. Thanks

## 2013-02-23 NOTE — Telephone Encounter (Signed)
PT called to inquire about her test results, and as to wether or not she can continue her medications. Please assist.

## 2013-02-24 NOTE — Telephone Encounter (Signed)
At her age I would not recommend she take any aspirin or aspirin products,,,,,,,,,,,,, tramadol 50 mg,,,,,,,,, one half tab twice a day when necessary

## 2013-02-24 NOTE — Telephone Encounter (Signed)
Call pt 

## 2013-03-09 ENCOUNTER — Other Ambulatory Visit: Payer: Self-pay | Admitting: Internal Medicine

## 2013-03-09 ENCOUNTER — Inpatient Hospital Stay (HOSPITAL_COMMUNITY)
Admission: EM | Admit: 2013-03-09 | Discharge: 2013-03-12 | DRG: 389 | Disposition: A | Discharge status: Skilled Nursing Facility | Payer: Medicare Other | Attending: Family Medicine | Admitting: Family Medicine

## 2013-03-09 ENCOUNTER — Encounter (HOSPITAL_COMMUNITY): Payer: Self-pay | Admitting: Emergency Medicine

## 2013-03-09 ENCOUNTER — Emergency Department (HOSPITAL_COMMUNITY): Payer: Medicare Other

## 2013-03-09 DIAGNOSIS — K2970 Gastritis, unspecified, without bleeding: Secondary | ICD-10-CM | POA: Diagnosis not present

## 2013-03-09 DIAGNOSIS — K4090 Unilateral inguinal hernia, without obstruction or gangrene, not specified as recurrent: Secondary | ICD-10-CM | POA: Diagnosis present

## 2013-03-09 DIAGNOSIS — I83009 Varicose veins of unspecified lower extremity with ulcer of unspecified site: Secondary | ICD-10-CM | POA: Diagnosis present

## 2013-03-09 DIAGNOSIS — N390 Urinary tract infection, site not specified: Secondary | ICD-10-CM | POA: Diagnosis present

## 2013-03-09 DIAGNOSIS — L97909 Non-pressure chronic ulcer of unspecified part of unspecified lower leg with unspecified severity: Secondary | ICD-10-CM | POA: Diagnosis present

## 2013-03-09 DIAGNOSIS — N183 Chronic kidney disease, stage 3 unspecified: Secondary | ICD-10-CM | POA: Diagnosis present

## 2013-03-09 DIAGNOSIS — B961 Klebsiella pneumoniae [K. pneumoniae] as the cause of diseases classified elsewhere: Secondary | ICD-10-CM | POA: Diagnosis present

## 2013-03-09 DIAGNOSIS — K560 Paralytic ileus: Secondary | ICD-10-CM | POA: Diagnosis present

## 2013-03-09 DIAGNOSIS — D649 Anemia, unspecified: Secondary | ICD-10-CM | POA: Diagnosis not present

## 2013-03-09 DIAGNOSIS — I129 Hypertensive chronic kidney disease with stage 1 through stage 4 chronic kidney disease, or unspecified chronic kidney disease: Secondary | ICD-10-CM | POA: Diagnosis present

## 2013-03-09 DIAGNOSIS — Z79899 Other long term (current) drug therapy: Secondary | ICD-10-CM | POA: Diagnosis not present

## 2013-03-09 DIAGNOSIS — R109 Unspecified abdominal pain: Secondary | ICD-10-CM

## 2013-03-09 DIAGNOSIS — Z4682 Encounter for fitting and adjustment of non-vascular catheter: Secondary | ICD-10-CM | POA: Diagnosis not present

## 2013-03-09 DIAGNOSIS — D638 Anemia in other chronic diseases classified elsewhere: Secondary | ICD-10-CM | POA: Diagnosis present

## 2013-03-09 DIAGNOSIS — N184 Chronic kidney disease, stage 4 (severe): Secondary | ICD-10-CM | POA: Diagnosis not present

## 2013-03-09 DIAGNOSIS — I10 Essential (primary) hypertension: Secondary | ICD-10-CM | POA: Diagnosis present

## 2013-03-09 DIAGNOSIS — K219 Gastro-esophageal reflux disease without esophagitis: Secondary | ICD-10-CM | POA: Diagnosis present

## 2013-03-09 DIAGNOSIS — K2990 Gastroduodenitis, unspecified, without bleeding: Secondary | ICD-10-CM | POA: Diagnosis not present

## 2013-03-09 DIAGNOSIS — R6889 Other general symptoms and signs: Secondary | ICD-10-CM | POA: Diagnosis not present

## 2013-03-09 DIAGNOSIS — J398 Other specified diseases of upper respiratory tract: Secondary | ICD-10-CM | POA: Diagnosis not present

## 2013-03-09 DIAGNOSIS — I498 Other specified cardiac arrhythmias: Secondary | ICD-10-CM | POA: Diagnosis present

## 2013-03-09 DIAGNOSIS — R141 Gas pain: Secondary | ICD-10-CM | POA: Diagnosis not present

## 2013-03-09 DIAGNOSIS — I872 Venous insufficiency (chronic) (peripheral): Secondary | ICD-10-CM | POA: Diagnosis present

## 2013-03-09 DIAGNOSIS — R112 Nausea with vomiting, unspecified: Secondary | ICD-10-CM | POA: Diagnosis not present

## 2013-03-09 DIAGNOSIS — Z5189 Encounter for other specified aftercare: Secondary | ICD-10-CM | POA: Diagnosis not present

## 2013-03-09 DIAGNOSIS — R188 Other ascites: Secondary | ICD-10-CM | POA: Diagnosis not present

## 2013-03-09 DIAGNOSIS — M1990 Unspecified osteoarthritis, unspecified site: Secondary | ICD-10-CM | POA: Diagnosis not present

## 2013-03-09 DIAGNOSIS — K56609 Unspecified intestinal obstruction, unspecified as to partial versus complete obstruction: Secondary | ICD-10-CM | POA: Diagnosis not present

## 2013-03-09 DIAGNOSIS — Z7982 Long term (current) use of aspirin: Secondary | ICD-10-CM

## 2013-03-09 DIAGNOSIS — M159 Polyosteoarthritis, unspecified: Secondary | ICD-10-CM | POA: Diagnosis present

## 2013-03-09 DIAGNOSIS — K5900 Constipation, unspecified: Secondary | ICD-10-CM | POA: Diagnosis not present

## 2013-03-09 DIAGNOSIS — J988 Other specified respiratory disorders: Secondary | ICD-10-CM | POA: Diagnosis not present

## 2013-03-09 DIAGNOSIS — Z96659 Presence of unspecified artificial knee joint: Secondary | ICD-10-CM | POA: Diagnosis not present

## 2013-03-09 DIAGNOSIS — R52 Pain, unspecified: Secondary | ICD-10-CM

## 2013-03-09 DIAGNOSIS — K56600 Partial intestinal obstruction, unspecified as to cause: Secondary | ICD-10-CM

## 2013-03-09 DIAGNOSIS — R110 Nausea: Secondary | ICD-10-CM | POA: Diagnosis not present

## 2013-03-09 HISTORY — DX: Diverticulosis of large intestine without perforation or abscess without bleeding: K57.30

## 2013-03-09 LAB — COMPREHENSIVE METABOLIC PANEL
BUN: 24 mg/dL — ABNORMAL HIGH (ref 6–23)
CO2: 27 mEq/L (ref 19–32)
Calcium: 10 mg/dL (ref 8.4–10.5)
Chloride: 104 mEq/L (ref 96–112)
Creatinine, Ser: 1.16 mg/dL — ABNORMAL HIGH (ref 0.50–1.10)
GFR calc non Af Amer: 40 mL/min — ABNORMAL LOW (ref >90)
Total Bilirubin: 0.7 mg/dL (ref 0.3–1.2)

## 2013-03-09 LAB — CBC WITH DIFFERENTIAL/PLATELET
Basophils Relative: 0 % (ref 0–1)
Eosinophils Relative: 0 % (ref 0–5)
HCT: 36.2 % (ref 36.0–46.0)
Hemoglobin: 11.8 g/dL — ABNORMAL LOW (ref 12.0–15.0)
Lymphocytes Relative: 7 % — ABNORMAL LOW (ref 12–46)
MCHC: 32.6 g/dL (ref 30.0–36.0)
MCV: 84 fL (ref 78.0–100.0)
Monocytes Absolute: 0.3 10*3/uL (ref 0.1–1.0)
Monocytes Relative: 4 % (ref 3–12)
Neutro Abs: 7.6 10*3/uL (ref 1.7–7.7)

## 2013-03-09 LAB — URINALYSIS W MICROSCOPIC + REFLEX CULTURE
Ketones, ur: NEGATIVE mg/dL
Nitrite: POSITIVE — AB
Protein, ur: NEGATIVE mg/dL
Urobilinogen, UA: 0.2 mg/dL (ref 0.0–1.0)

## 2013-03-09 LAB — LIPASE, BLOOD: Lipase: 43 U/L (ref 11–59)

## 2013-03-09 MED ORDER — FENTANYL CITRATE 0.05 MG/ML IJ SOLN
25.0000 ug | Freq: Once | INTRAMUSCULAR | Status: AC
  Administered 2013-03-09: 25 ug via INTRAVENOUS
  Filled 2013-03-09: qty 2

## 2013-03-09 MED ORDER — PHENOL 1.4 % MT LIQD
2.0000 | OROMUCOSAL | Status: DC | PRN
  Filled 2013-03-09: qty 177

## 2013-03-09 MED ORDER — SODIUM CHLORIDE 0.9 % IV SOLN
Freq: Once | INTRAVENOUS | Status: AC
  Administered 2013-03-09: 17:00:00 via INTRAVENOUS

## 2013-03-09 MED ORDER — PROMETHAZINE HCL 25 MG/ML IJ SOLN
6.2500 mg | Freq: Four times a day (QID) | INTRAMUSCULAR | Status: DC | PRN
  Filled 2013-03-09: qty 1

## 2013-03-09 MED ORDER — LACTATED RINGERS IV BOLUS (SEPSIS): 1000.0000 mL | Freq: Three times a day (TID) | INTRAVENOUS | Status: DC | PRN

## 2013-03-09 MED ORDER — ALUM & MAG HYDROXIDE-SIMETH 200-200-20 MG/5ML PO SUSP: 30.0000 mL | Freq: Four times a day (QID) | ORAL | Status: DC | PRN

## 2013-03-09 MED ORDER — MAGIC MOUTHWASH
15.0000 mL | Freq: Four times a day (QID) | ORAL | Status: DC | PRN
  Filled 2013-03-09: qty 15

## 2013-03-09 MED ORDER — ONDANSETRON HCL 4 MG/2ML IJ SOLN: 4.0000 mg | Freq: Four times a day (QID) | INTRAMUSCULAR | Status: DC | PRN

## 2013-03-09 MED ORDER — WHITE PETROLATUM GEL
Status: AC
  Administered 2013-03-09: 18:00:00
  Filled 2013-03-09: qty 5

## 2013-03-09 MED ORDER — IOHEXOL 300 MG/ML  SOLN
50.0000 mL | Freq: Once | INTRAMUSCULAR | Status: AC | PRN
  Administered 2013-03-09: 50 mL via ORAL

## 2013-03-09 MED ORDER — DEXTROSE 5 % IV SOLN
1.0000 g | Freq: Once | INTRAVENOUS | Status: AC
  Administered 2013-03-09: 1 g via INTRAVENOUS
  Filled 2013-03-09: qty 10

## 2013-03-09 MED ORDER — DIPHENHYDRAMINE HCL 50 MG/ML IJ SOLN: 12.5000 mg | Freq: Four times a day (QID) | INTRAMUSCULAR | Status: DC | PRN

## 2013-03-09 MED ORDER — IOHEXOL 300 MG/ML  SOLN
50.0000 mL | Freq: Once | INTRAMUSCULAR | Status: AC | PRN
  Administered 2013-03-09: 100 mL via INTRAVENOUS

## 2013-03-09 MED ORDER — ONDANSETRON HCL 4 MG/2ML IJ SOLN
4.0000 mg | Freq: Once | INTRAMUSCULAR | Status: AC
  Administered 2013-03-09: 4 mg via INTRAVENOUS
  Filled 2013-03-09: qty 2

## 2013-03-09 MED ORDER — LACTATED RINGERS IV BOLUS (SEPSIS): 1000.0000 mL | Freq: Once | INTRAVENOUS | Status: DC

## 2013-03-09 MED ORDER — MENTHOL 3 MG MT LOZG
1.0000 | LOZENGE | OROMUCOSAL | Status: DC | PRN
  Filled 2013-03-09: qty 9

## 2013-03-09 MED ORDER — LIP MEDEX EX OINT
1.0000 "application " | TOPICAL_OINTMENT | Freq: Two times a day (BID) | CUTANEOUS | Status: DC
  Administered 2013-03-10 – 2013-03-12 (×5): 1 via TOPICAL
  Filled 2013-03-09: qty 7

## 2013-03-09 MED ORDER — BISACODYL 10 MG RE SUPP
10.0000 mg | Freq: Every day | RECTAL | Status: DC
  Administered 2013-03-10 – 2013-03-12 (×3): 10 mg via RECTAL
  Filled 2013-03-09 (×3): qty 1

## 2013-03-09 MED ORDER — METOPROLOL TARTRATE 1 MG/ML IV SOLN
5.0000 mg | Freq: Four times a day (QID) | INTRAVENOUS | Status: DC | PRN
  Filled 2013-03-09: qty 5

## 2013-03-09 MED ORDER — ACETAMINOPHEN 650 MG RE SUPP: 650.0000 mg | Freq: Four times a day (QID) | RECTAL | Status: DC | PRN

## 2013-03-09 MED ORDER — PROMETHAZINE HCL 25 MG/ML IJ SOLN: 12.5000 mg | Freq: Four times a day (QID) | INTRAMUSCULAR | Status: DC | PRN

## 2013-03-09 NOTE — Consult Note (Signed)
Erin Gibson  May 18, 1919 [id number]  CARE TEAM:  PCP: Rogelia Boga, MD  Outpatient Care Team: Patient Care Team: Gordy Savers, MD as PCP - General  Inpatient Treatment Team: Treatment Team: Attending Provider: Ethelda Chick, MD; Technician: April M Whitlow, EMT; Physician Assistant: Lottie Mussel, PA-C; Registered Nurse: Laveda Norman, RN; Rounding Team: Armida Sans, MD  This patient is a 79 y.o.female who presents today for surgical evaluation at the request of Erin Gibson, PAC.   Reason for evaluation: Partial small bowel obstruction  Pleasant elderly woman with prior abdominal surgeries including Partial hysterectomy, salpingo-nephrectomy, completion hysterectomy, Open cholecystectomy and repair of hernias in the past.No surgeries for the past two decades.  There was concern she may have had a colon obstruction with a mass In 2011.  Colonoscopy and small bowel follow-through negative.  Possible partial small bowel obstruction that resolved without surgery.  Has chronic constipation.  Takes MiraLAX.  That seems to help.  However, patient began to have crampy abdominal pain and distention.  Constipation worsened 10 days ago.  Developed nausea and vomiting.  Pain became intense and rated a day.  She thought she was going to die.  Then the pain backed off.  However it returned, so she came to the emergency room.  No personal nor family history of GI/colon cancer, inflammatory bowel disease, irritable bowel syndrome, allergy such as Celiac Sprue, dietary/dairy problems, colitis, ulcers nor gastritis.  No recent sick contacts/gastroenteritis.  No travel outside the country.  No changes in diet. She lives alone.  She drug herself.  She is rather independent.  Her daughter lives an hour away and sees her frequently.   Past Medical History  Diagnosis Date  . ANEMIA 03/31/2009  . DEGENERATIVE JOINT DISEASE, GENERALIZED 05/06/2007  . GERD 04/24/2007  .  HYPERTENSION 04/24/2007  . VENOUS STASIS ULCER 07/20/2008  . DVT (deep venous thrombosis)     hx of  . Chronic kidney disease     Past Surgical History  Procedure Laterality Date  . Cataract extraction    . Abdominal hysterectomy    . Total knee arthroplasty      x2  . Hernia repair      History   Social History  . Marital Status: Widowed    Spouse Name: N/A    Number of Children: N/A  . Years of Education: N/A   Occupational History  . Not on file.   Social History Main Topics  . Smoking status: Never Smoker   . Smokeless tobacco: Never Used  . Alcohol Use: No  . Drug Use: No  . Sexually Active: Not on file   Other Topics Concern  . Not on file   Social History Narrative  . No narrative on file    No family history on file.  No current facility-administered medications for this encounter.   Current Outpatient Prescriptions  Medication Sig Dispense Refill  . Artificial Tear GEL Place 2 drops into both eyes 2 (two) times daily.      Marland Kitchen aspirin EC 81 MG tablet Take 81 mg by mouth every morning.      . Calcium Carbonate-Vitamin D (CALCIUM 600 + D PO) Take 1 tablet by mouth every morning.      . Cyanocobalamin (VITAMIN B-12) 2500 MCG SUBL Place 1 tablet under the tongue every morning.      . diclofenac (VOLTAREN) 50 MG EC tablet Take 50 mg by mouth 2 (two) times daily.      Marland Kitchen  furosemide (LASIX) 40 MG tablet Take 1 tablet (40 mg total) by mouth daily.  90 tablet  6  . meclizine (ANTIVERT) 25 MG tablet Take 25 mg by mouth 3 (three) times daily as needed for dizziness.       . nitroGLYCERIN (NITROSTAT) 0.4 MG SL tablet Place 0.4 mg under the tongue every 5 (five) minutes as needed for chest pain.      Marland Kitchen omeprazole (PRILOSEC) 40 MG capsule Take 1 capsule (40 mg total) by mouth daily.  90 capsule  6  . polyethylene glycol powder (GLYCOLAX/MIRALAX) powder Take 17 g by mouth 2 (two) times daily.  2550 g  4  . potassium chloride SA (K-DUR,KLOR-CON) 20 MEQ tablet Take 1  tablet (20 mEq total) by mouth daily.  90 tablet  6  . quinapril (ACCUPRIL) 40 MG tablet Take 1 tablet (40 mg total) by mouth daily.  90 tablet  3  . vitamin E (VITAMIN E) 400 UNIT capsule Take 400 Units by mouth every morning.          Allergies  Allergen Reactions  . Codeine Nausea And Vomiting    Violently ill  . Diltiazem Hcl Other (See Comments)    Not sure about this reaction  . Ibuprofen Nausea And Vomiting    ROS: Constitutional:  No fevers, chills, sweats.  Weight stable Eyes:  No vision changes, No discharge HENT:  No sore throats, nasal drainage Lymph: No neck swelling, No bruising easily Pulmonary:  No cough, productive sputum CV: No orthopnea, PND  Patient walks 20 minutes for about 1/2 miles with a cane without difficulty.  No exertional chest/neck/shoulder/arm pain. GI:  No personal nor family history of GI/colon cancer, inflammatory bowel disease, irritable bowel syndrome, allergy such as Celiac Sprue, dietary/dairy problems, colitis, ulcers nor gastritis.  No recent sick contacts/gastroenteritis.  No travel outside the country.  No changes in diet. Renal: No UTIs, No hematuria Genital:  No drainage, bleeding, masses Musculoskeletal: No severe joint pain.  Good ROM major joints Skin:  No sores or lesions.  No rashes Heme/Lymph:  No easy bleeding.  No swollen lymph nodes Neuro: No focal weakness/numbness.  No seizures.  Sometimes mild dizziness Psych: No suicidal ideation.  No hallucinations  BP 133/52  Pulse 55  Temp(Src) 98 F (36.7 C) (Oral)  Resp 18  Ht 5' (1.524 m)  Wt 112 lb (50.803 kg)  BMI 21.87 kg/m2  SpO2 99%  Physical Exam: General: Pt awake/alert/oriented x4 in no major acute distress Eyes: PERRL, normal EOM. Sclera nonicteric Neuro: CN II-XII intact w/o focal sensory/motor deficits. Lymph: No head/neck/groin lymphadenopathy Psych:  No delerium/psychosis/paranoia HENT: Normocephalic, Mucus membranes moist.  No thrush.  Very narrow Nasogastric  tube in place.  Minimal output.  Does not seem to be in very far. Neck: Supple, No tracheal deviation Chest: No pain.  Good respiratory excursion. CV:  Pulses intact.  Regular rhythm Abdomen: Soft, Moderately distended.  Nontender.   Genital: Normal external female genitalia.  Obvious left inguinal hernia that is soft and reducible.  Mild weakness on right groin but no definite hernia there Ext:  SCDs BLE.  No significant edema.  No cyanosis Skin: No petechiae / purpurea.  No major sores Musculoskeletal: No severe joint pain.  Good ROM major joints   Results:   Labs: Results for orders placed during the hospital encounter of 03/09/13 (from the past 48 hour(s))  CBC WITH DIFFERENTIAL     Status: Abnormal   Collection Time  03/09/13  4:50 PM      Result Value Range   WBC 8.6  4.0 - 10.5 K/uL   RBC 4.31  3.87 - 5.11 MIL/uL   Hemoglobin 11.8 (*) 12.0 - 15.0 g/dL   HCT 69.6  29.5 - 28.4 %   MCV 84.0  78.0 - 100.0 fL   MCH 27.4  26.0 - 34.0 pg   MCHC 32.6  30.0 - 36.0 g/dL   RDW 13.2  44.0 - 10.2 %   Platelets 222  150 - 400 K/uL   Neutrophils Relative % 89 (*) 43 - 77 %   Neutro Abs 7.6  1.7 - 7.7 K/uL   Lymphocytes Relative 7 (*) 12 - 46 %   Lymphs Abs 0.6 (*) 0.7 - 4.0 K/uL   Monocytes Relative 4  3 - 12 %   Monocytes Absolute 0.3  0.1 - 1.0 K/uL   Eosinophils Relative 0  0 - 5 %   Eosinophils Absolute 0.0  0.0 - 0.7 K/uL   Basophils Relative 0  0 - 1 %   Basophils Absolute 0.0  0.0 - 0.1 K/uL  COMPREHENSIVE METABOLIC PANEL     Status: Abnormal   Collection Time    03/09/13  4:50 PM      Result Value Range   Sodium 140  135 - 145 mEq/L   Potassium 4.4  3.5 - 5.1 mEq/L   Chloride 104  96 - 112 mEq/L   CO2 27  19 - 32 mEq/L   Glucose, Bld 117 (*) 70 - 99 mg/dL   BUN 24 (*) 6 - 23 mg/dL   Creatinine, Ser 7.25 (*) 0.50 - 1.10 mg/dL   Calcium 36.6  8.4 - 44.0 mg/dL   Total Protein 6.5  6.0 - 8.3 g/dL   Albumin 3.4 (*) 3.5 - 5.2 g/dL   AST 19  0 - 37 U/L   ALT 10  0 - 35  U/L   Alkaline Phosphatase 112  39 - 117 U/L   Total Bilirubin 0.7  0.3 - 1.2 mg/dL   GFR calc non Af Amer 40 (*) >90 mL/min   GFR calc Af Amer 46 (*) >90 mL/min   Comment:            The eGFR has been calculated     using the CKD EPI equation.     This calculation has not been     validated in all clinical     situations.     eGFR's persistently     <90 mL/min signify     possible Chronic Kidney Disease.  LIPASE, BLOOD     Status: None   Collection Time    03/09/13  4:50 PM      Result Value Range   Lipase 43  11 - 59 U/L  POCT I-STAT TROPONIN I     Status: None   Collection Time    03/09/13  4:58 PM      Result Value Range   Troponin i, poc 0.01  0.00 - 0.08 ng/mL   Comment 3            Comment: Due to the release kinetics of cTnI,     a negative result within the first hours     of the onset of symptoms does not rule out     myocardial infarction with certainty.     If myocardial infarction is still suspected,     repeat the test at appropriate intervals.  URINALYSIS W MICROSCOPIC + REFLEX CULTURE     Status: Abnormal   Collection Time    03/09/13  6:20 PM      Result Value Range   Color, Urine YELLOW  YELLOW   APPearance CLEAR  CLEAR   Specific Gravity, Urine 1.012  1.005 - 1.030   pH 6.0  5.0 - 8.0   Glucose, UA NEGATIVE  NEGATIVE mg/dL   Hgb urine dipstick NEGATIVE  NEGATIVE   Bilirubin Urine NEGATIVE  NEGATIVE   Ketones, ur NEGATIVE  NEGATIVE mg/dL   Protein, ur NEGATIVE  NEGATIVE mg/dL   Urobilinogen, UA 0.2  0.0 - 1.0 mg/dL   Nitrite POSITIVE (*) NEGATIVE   Leukocytes, UA SMALL (*) NEGATIVE   WBC, UA 3-6  <3 WBC/hpf   Bacteria, UA MANY (*) RARE   Squamous Epithelial / LPF RARE  RARE    Imaging / Studies: Ct Abdomen Pelvis Wo Contrast  03/09/2013   *RADIOLOGY REPORT*  Clinical Data: Constipation, nausea and vomiting  CT ABDOMEN AND PELVIS WITHOUT CONTRAST  Technique:  Multidetector CT imaging of the abdomen and pelvis was performed following the standard  protocol without intravenous contrast.  Comparison: CT abdomen pelvis - 04/20/2010; abdominal radiograph - 03/09/2013  Findings:  The lack of intravenous contrast limits the ability to evaluate solid abdominal organs. Examination is further degraded secondary to the lack of significant mesenteric fat.  Normal hepatic contour.  There is a trace amount of fluid adjacent to the right lobe of the liver.  Normal appearance of the medially located gallbladder.  The approximately 5.8 x 4.3 cm exophytic lesion with peripheral mural nodular calcification has minimally increased in size in the interval, previously, 5.5 x 4.1 cm.  There is a triangular-shaped increased opacification involving the mid/superior aspect of the left kidney (image 20, series 2) which may represent a small geographic area of medullary nephrocalcinosis.  No definite renal stones.  No urinary obstruction or perinephric stranding.  The bilateral adrenal glands are suboptimally evaluated on this noncontrast examination.  The pancreas appears atrophic.  Normal noncontrast appearance of the spleen.  Ingested enteric contrast extends to the level of the distal small bowel.  The proximal small bowel is mildly dilated with possible transition point within the left hemi pelvis (axial image 59, series 2).  This is associated relative decompression of the downstream small bowel.  Air is seen within the hepatic flexure of the colon though there is relative decompression of the colon. There is a small amount of intra abdominal fluid with fluid seen within the pelvic cul-de-sac.  No pneumoperitoneum, pneumatosis or portal venous gas.   A left-sided presumed indirect inguinal hernia contains a portion of the descending colon but does not result in obstruction.  There is a small amount of fluid within the caudal most aspect of the hernia sac.  There is grossly unchanged mild aneurysmal dilatation of the infrarenal abdominal aorta measuring approximately 2.9 cm in  greatest axial dimension (image 30, series 2).  Evaluation for intra-abdominal adenopathy is limited on this noncontrast examination.  Limited visualization of the lower thorax demonstrates persistent mild elevation/eventration of the left hemidiaphragm. There is unchanged partial atelectasis of the left lower lobe.  There is an unchanged approximate 4 mm nodule within the right costophrenic angle (image 6, series 6), unchanged since the 03/2010 examination and thus of benign etiology.  Borderline cardiomegaly.  Calcifications within the aortic valve annulus.  No pericardial effusion.  No acute or aggressive osseous abnormalities.  Severe scoliotic  curvature of the thoracolumbar spine with multilevel degenerative change.  Vertebral body heights appear grossly preserved given obliquity.  IMPRESSION:  1.  Mild to moderate upstream dilatation of the small bowel with possible transition point noted within the left hemi pelvis, worrisome for least a partial small bowel obstruction.  The etiology of this apparent obstruction is not depicted on this examination and thus is presumably secondary to adhesions. 2.  Small left sided presumably indirect inguinal hernia contains a small portion of the descending colon but does not definitely result in obstruction at this location. 3.  Small amount of intra-abdominal fluid without drainable fluid collection. 4.  Grossly unchanged ectasia of the infrarenal abdominal aorta measuring approximately 2.9 cm in diameter. 5.  The approximately 5.8 cm exophytic peripherally calcified right- sided renal lesion correlates with the findings on recent abdominal radiograph.  This lesion has minimally increased in size since remote abdominal CT performed 03/2010, previously, 5.5 cm and while is incompletely evaluated on this examination, is at least a Bosniak category IIF lesion.   Original Report Authenticated By: Tacey Ruiz, MD   Dg Chest Portable 1 View  03/09/2013   *RADIOLOGY REPORT*   Clinical Data: Constipation, nausea and emesis.  Evaluate nasogastric tube placement.  PORTABLE CHEST - 1 VIEW  Comparison: Acute abdominal series 03/09/2013.  Findings: Tip of nasogastric tube is near the gastroesophageal junction, with side port in the distal third of the esophagus. Lung volumes are low.  Linear opacities in the left base favored to predominately reflect subsegmental atelectasis, although the sequelae of recent aspiration is not excluded.  No definite pleural effusions.  Mild diffuse interstitial prominence and peribronchial cuffing.  Mild cephalization of the pulmonary vasculature. Thickening of the minor fissure.  Heart size appears mildly enlarged. The patient is rotated to the left on today's exam, resulting in distortion of the mediastinal contours and reduced diagnostic sensitivity and specificity for mediastinal pathology. Atherosclerosis in the thoracic aorta.  IMPRESSION: 1.  Tip of nasogastric tube is at the gastroesophageal junction and could be advanced approximately 8-10 cm for more optimal placement. 2.  Worsening opacity in the left lower lobe which may reflect increasing atelectasis, however, sequelae of recent aspiration is not excluded.  Clinical correlation is recommended. 3.  Findings suggestive of developing mild interstitial pulmonary edema, as above. 4.  Atherosclerosis.   Original Report Authenticated By: Trudie Reed, M.D.   Dg Abd Acute W/chest  03/09/2013   *RADIOLOGY REPORT*  Clinical Data: Abdominal pain for 2 weeks, nausea and vomiting, constipation  ACUTE ABDOMEN SERIES (ABDOMEN 2 VIEW & CHEST 1 VIEW)  Comparison: 04/23/2010; 04/21/2010;  Findings:  Enlarged cardiac silhouette and mediastinal contours.  The lungs appear mildly hyperexpanded with flattening of bilateral hemidiaphragms.  Minimal left basilar linear heterogeneous opacities are grossly unchanged.  No pleural effusion definite pneumothorax.  No definite evidence of edema.  There is moderate gaseous  distension of multiple loops of colon. No pneumoperitoneum, pneumatosis or portal venous gas.  Laminar calcifications are seen within the right upper abdominal quadrant and favored to represent calcifications within the gallbladder wall.  Atherosclerotic plaque within the abdominal aorta.  There is possible mild ectasia of the abdominal aorta measuring approximately 3.2 cm in diameter.  Severe scoliotic curvature of the thoracolumbar spine. There is presumed chronic deformity of the lateral aspect of the right third rib. A bone island is seen within the left proximal humeral metaphysis.  IMPRESSION:  1.  Mild gaseous distension of the colon suggestive of ileus.  No definite evidence of obstruction. 2.  The gallbladder wall appears calcified, similar to remote prior examinations.  Further evaluation with abdominal CT may be performed as clinically indicated.  3.  Cardiomegaly.  Further evaluation cardaiac echo may form as clinically indicated. 4.  Suspected mild aneurysmal dilatation of the abdominal aorta. Further evaluation with a non emergent aortic ultrasound may be performed as clinically indicated.  5.  Chronic left basilar/retrocardiac opacities, favored to represent atelectasis or scar. Further evaluation with a PA and lateral chest radiograph may be obtained as clinically indicated.   Original Report Authenticated By: Tacey Ruiz, MD    Medications / Allergies: per chart  Antibiotics: Anti-infectives   Start     Dose/Rate Route Frequency Ordered Stop   03/09/13 1945  cefTRIAXone (ROCEPHIN) 1 g in dextrose 5 % 50 mL IVPB     1 g 100 mL/hr over 30 Minutes Intravenous  Once 03/09/13 1938 03/09/13 2038      Assessment  Anija Rayne Du  79 y.o. female       Problem List:  Principal Problem:   Partial small bowel obstruction Active Problems:   ANEMIA   HYPERTENSION   VENOUS STASIS ULCER   GERD   DEGENERATIVE JOINT DISEASE, GENERALIZED   Inguinal hernia, left - Contains sigmoid colon.   Reducible   Partial small bowel obstruction with transition zone in the pelvis.  Strongly suspicious for Adhesion etiology.  Left inguinal hernia reducible.  Unlikely as cause of obstruction  Plan:  Agree with admission.  IV fluids.  Nasogastric tube decompression.  Suspect current NG tube is too narrow Seymour Bars will not work well.  Probably will need to be increased in size.  We will hold off on that for now per the patient's wishes.  Hopefully will improve with bowel rest and conservative management.  Normally a 75% chance.  However, given prior episode of ?obstruction only a few years ago, more like a 50-50 chance.  Nonetheless, would like to try avoid operation if possible.  Patient agrees.  She is anxious and worried about surgery and recovery.  However she confesses she did come the emergency room to be aggressive.  She does understand if she does not improve within the next few days or if she worsens, consider surgery:  The anatomy & physiology of the digestive tract was discussed.  The pathophysiology of perforation was discussed.  Differential diagnosis such as perforated ulcer or colon, etc was discussed.   Natural history risks without surgery such as death was discussed.  I recommended abdominal exploration to diagnose & treat the source of the problem.  Laparoscopic & open techniques were discussed.   Risks such as bleeding, infection, abscess, leak, reoperation, bowel resection, possible ostomy, hernia, heart attack, death, and other risks were discussed.   The risks of no intervention will lead to serious problems including death.   I expressed a good likelihood that surgery will address the problem.    Goals of post-operative recovery were discussed as well.  We will work to minimize complications although risks in an emergent setting are high.   Questions were answered.  The patient expressed understanding & wishes to proceed with surgery.      Replace nasogastric tube is not  working properly.  -VTE prophylaxis- SCDs, etc  -mobilize as tolerated to help recovery    Ardeth Sportsman, M.D., F.A.C.S. Gastrointestinal and Minimally Invasive Surgery [facility] [address] [place], [place] [address] [phone]  [phone] Main / Paging   03/09/2013

## 2013-03-09 NOTE — H&P (Signed)
History and Physical  MINDEE ROBLEDO ZOX:[id number] DOB: [birth year] [name]: 03/09/2013  Referring physician: Lemont Fillers, PA PCP: Rogelia Boga, MD   Chief Complaint: Nausea/vomiting and abdominal pain  HPI: Patient is a 79 year old female with past medical history most significant for degenerative joint disease, reflux disease, hypertension, chronic venous stasis, chronic kidney disease and history of partial small bowel obstruction in the past(2.5 years ago) presented to ED with chief complaint of nausea, vomiting, abdominal pain and dizziness. Patient has chronic constipation and has been using MiraLax twice a day. Patient developed nausea and vomiting and threw up multiple times since breakfast in the morning. Vomitus contained food particles. Vomiting and nausea was associated with abdominal plan, generalized, aching in character, present in the upper middle to left abdomen, nonradiating, no exacerbating or relieving factors. Patient had been unable to move her bowels since morning. She also felt dizzy and almost passed out twice since morning.  She denies any fever, chills, chest pain, shortness of breath, change in diet, change in medications. Patient has been having progressive constipation over last 1 week. Her dog died 3 weeks ago. She lives alone and her daughter lives in [place], [place].  15 point review of system has been negative except as noted above in the history of present illness.   Past Medical History  Diagnosis Date  . ANEMIA 03/31/2009  . DEGENERATIVE JOINT DISEASE, GENERALIZED 05/06/2007  . GERD 04/24/2007  . HYPERTENSION 04/24/2007  . VENOUS STASIS ULCER 07/20/2008  . DVT (deep venous thrombosis)     hx of  . Chronic kidney disease     Past Surgical History  Procedure Laterality Date  . Cataract extraction    . Abdominal hysterectomy    . Total knee arthroplasty      x2  . Hernia repair      Social History:  reports that she has never smoked. She has never  used smokeless tobacco. She reports that she does not drink alcohol or use illicit drugs.  Allergies  Allergen Reactions  . Codeine Nausea And Vomiting    Violently ill  . Diltiazem Hcl Other (See Comments)    Not sure about this reaction  . Ibuprofen Nausea And Vomiting    No family history on file.   Prior to Admission medications   Medication Sig Start Date End Date Taking? Authorizing Provider  Artificial Tear GEL Place 2 drops into both eyes 2 (two) times daily.   Yes Historical Provider, MD  aspirin EC 81 MG tablet Take 81 mg by mouth every morning.   Yes Historical Provider, MD  Calcium Carbonate-Vitamin D (CALCIUM 600 + D PO) Take 1 tablet by mouth every morning.   Yes Historical Provider, MD  Cyanocobalamin (VITAMIN B-12) 2500 MCG SUBL Place 1 tablet under the tongue every morning.   Yes Historical Provider, MD  diclofenac (VOLTAREN) 50 MG EC tablet Take 50 mg by mouth 2 (two) times daily.   Yes Historical Provider, MD  furosemide (LASIX) 40 MG tablet Take 1 tablet (40 mg total) by mouth daily. 07/21/12  Yes Gordy Savers, MD  meclizine (ANTIVERT) 25 MG tablet Take 25 mg by mouth 3 (three) times daily as needed for dizziness.    Yes Historical Provider, MD  nitroGLYCERIN (NITROSTAT) 0.4 MG SL tablet Place 0.4 mg under the tongue every 5 (five) minutes as needed for chest pain.   Yes Historical Provider, MD  omeprazole (PRILOSEC) 40 MG capsule Take 1 capsule (40 mg total) by mouth daily. 07/21/12  Yes Gordy Savers, MD  polyethylene glycol powder (GLYCOLAX/MIRALAX) powder Take 17 g by mouth 2 (two) times daily. 08/13/12  Yes Gordy Savers, MD  potassium chloride SA (K-DUR,KLOR-CON) 20 MEQ tablet Take 1 tablet (20 mEq total) by mouth daily. 07/21/12  Yes Gordy Savers, MD  quinapril (ACCUPRIL) 40 MG tablet Take 1 tablet (40 mg total) by mouth daily. 01/19/13  Yes Gordy Savers, MD  vitamin E (VITAMIN E) 400 UNIT capsule Take 400 Units by mouth every  morning.    Yes Historical Provider, MD   Physical Exam: Filed Vitals:   03/09/13 1553 03/09/13 2023  BP: 190/71 133/52  Pulse: 54 55  Temp: 98 F (36.7 C)   TempSrc: Oral   Resp: 16 18  Height: 5' (1.524 m)   Weight: 112 lb (50.803 kg)   SpO2: 99%    Physical Exam: General: Vital signs reviewed and noted. Well-developed, well-nourished, in no acute distress; alert, appropriate and cooperative throughout examination.  Head: Normocephalic, atraumatic.  Eyes: PERRL, EOMI, No signs of anemia or jaundince.  Nose: Mucous membranes moist, not inflammed, nonerythematous.  Throat: Oropharynx nonerythematous, no exudate appreciated.   Neck: No deformities, masses, or tenderness noted.Supple, No carotid Bruits, no JVD.  Lungs:  Normal respiratory effort. Clear to auscultation BL without crackles or wheezes.  Heart: Bradycardic but regular and rhythmic. S1 and S2 normal without gallop, murmur, or rubs.  Abdomen:  Hyperactive bowel sounds. Soft, Nondistended, non-tender.  No masses or organomegaly.  Extremities: No pretibial edema.  Neurologic: A&O X3, CN II - XII are grossly intact. Motor strength is 5/5 in the all 4 extremities, Sensations intact to light touch, Cerebellar signs negative.  Skin: No visible rashes, scars.     Wt Readings from Last 3 Encounters:  03/09/13 112 lb (50.803 kg)  02/18/13 112 lb (50.803 kg)  01/19/13 116 lb (52.617 kg)    Labs on Admission:  Basic Metabolic Panel:  Recent Labs Lab 03/09/13 1650  NA 140  K 4.4  CL 104  CO2 27  GLUCOSE 117*  BUN 24*  CREATININE 1.16*  CALCIUM 10.0    Liver Function Tests:  Recent Labs Lab 03/09/13 1650  AST 19  ALT 10  ALKPHOS 112  BILITOT 0.7  PROT 6.5  ALBUMIN 3.4*    Recent Labs Lab 03/09/13 1650  LIPASE 43   CBC:  Recent Labs Lab 03/09/13 1650  WBC 8.6  NEUTROABS 7.6  HGB 11.8*  HCT 36.2  MCV 84.0  PLT 222    Troponin (Point of Care Test)  Recent Labs  03/09/13 1658  TROPIPOC  0.01    Radiological Exams on Admission: Ct Abdomen Pelvis Wo Contrast  03/09/2013   *RADIOLOGY REPORT*  Clinical Data: Constipation, nausea and vomiting  CT ABDOMEN AND PELVIS WITHOUT CONTRAST  Technique:  Multidetector CT imaging of the abdomen and pelvis was performed following the standard protocol without intravenous contrast.  Comparison: CT abdomen pelvis - 04/20/2010; abdominal radiograph - 03/09/2013  Findings:  The lack of intravenous contrast limits the ability to evaluate solid abdominal organs. Examination is further degraded secondary to the lack of significant mesenteric fat.  Normal hepatic contour.  There is a trace amount of fluid adjacent to the right lobe of the liver.  Normal appearance of the medially located gallbladder.  The approximately 5.8 x 4.3 cm exophytic lesion with peripheral mural nodular calcification has minimally increased in size in the interval, previously, 5.5 x 4.1 cm.  There is a  triangular-shaped increased opacification involving the mid/superior aspect of the left kidney (image 20, series 2) which may represent a small geographic area of medullary nephrocalcinosis.  No definite renal stones.  No urinary obstruction or perinephric stranding.  The bilateral adrenal glands are suboptimally evaluated on this noncontrast examination.  The pancreas appears atrophic.  Normal noncontrast appearance of the spleen.  Ingested enteric contrast extends to the level of the distal small bowel.  The proximal small bowel is mildly dilated with possible transition point within the left hemi pelvis (axial image 59, series 2).  This is associated relative decompression of the downstream small bowel.  Air is seen within the hepatic flexure of the colon though there is relative decompression of the colon. There is a small amount of intra abdominal fluid with fluid seen within the pelvic cul-de-sac.  No pneumoperitoneum, pneumatosis or portal venous gas.   A left-sided presumed indirect  inguinal hernia contains a portion of the descending colon but does not result in obstruction.  There is a small amount of fluid within the caudal most aspect of the hernia sac.  There is grossly unchanged mild aneurysmal dilatation of the infrarenal abdominal aorta measuring approximately 2.9 cm in greatest axial dimension (image 30, series 2).  Evaluation for intra-abdominal adenopathy is limited on this noncontrast examination.  Limited visualization of the lower thorax demonstrates persistent mild elevation/eventration of the left hemidiaphragm. There is unchanged partial atelectasis of the left lower lobe.  There is an unchanged approximate 4 mm nodule within the right costophrenic angle (image 6, series 6), unchanged since the 03/2010 examination and thus of benign etiology.  Borderline cardiomegaly.  Calcifications within the aortic valve annulus.  No pericardial effusion.  No acute or aggressive osseous abnormalities.  Severe scoliotic curvature of the thoracolumbar spine with multilevel degenerative change.  Vertebral body heights appear grossly preserved given obliquity.  IMPRESSION:  1.  Mild to moderate upstream dilatation of the small bowel with possible transition point noted within the left hemi pelvis, worrisome for least a partial small bowel obstruction.  The etiology of this apparent obstruction is not depicted on this examination and thus is presumably secondary to adhesions. 2.  Small left sided presumably indirect inguinal hernia contains a small portion of the descending colon but does not definitely result in obstruction at this location. 3.  Small amount of intra-abdominal fluid without drainable fluid collection. 4.  Grossly unchanged ectasia of the infrarenal abdominal aorta measuring approximately 2.9 cm in diameter. 5.  The approximately 5.8 cm exophytic peripherally calcified right- sided renal lesion correlates with the findings on recent abdominal radiograph.  This lesion has minimally  increased in size since remote abdominal CT performed 03/2010, previously, 5.5 cm and while is incompletely evaluated on this examination, is at least a Bosniak category IIF lesion.   Original Report Authenticated By: Tacey Ruiz, MD   Dg Abd Acute W/chest  03/09/2013   *RADIOLOGY REPORT*  Clinical Data: Abdominal pain for 2 weeks, nausea and vomiting, constipation  ACUTE ABDOMEN SERIES (ABDOMEN 2 VIEW & CHEST 1 VIEW)  Comparison: 04/23/2010; 04/21/2010;  Findings:  Enlarged cardiac silhouette and mediastinal contours.  The lungs appear mildly hyperexpanded with flattening of bilateral hemidiaphragms.  Minimal left basilar linear heterogeneous opacities are grossly unchanged.  No pleural effusion definite pneumothorax.  No definite evidence of edema.  There is moderate gaseous distension of multiple loops of colon. No pneumoperitoneum, pneumatosis or portal venous gas.  Laminar calcifications are seen within the right upper abdominal  quadrant and favored to represent calcifications within the gallbladder wall.  Atherosclerotic plaque within the abdominal aorta.  There is possible mild ectasia of the abdominal aorta measuring approximately 3.2 cm in diameter.  Severe scoliotic curvature of the thoracolumbar spine. There is presumed chronic deformity of the lateral aspect of the right third rib. A bone island is seen within the left proximal humeral metaphysis.  IMPRESSION:  1.  Mild gaseous distension of the colon suggestive of ileus.  No definite evidence of obstruction. 2.  The gallbladder wall appears calcified, similar to remote prior examinations.  Further evaluation with abdominal CT may be performed as clinically indicated.  3.  Cardiomegaly.  Further evaluation cardaiac echo may form as clinically indicated. 4.  Suspected mild aneurysmal dilatation of the abdominal aorta. Further evaluation with a non emergent aortic ultrasound may be performed as clinically indicated.  5.  Chronic left  basilar/retrocardiac opacities, favored to represent atelectasis or scar. Further evaluation with a PA and lateral chest radiograph may be obtained as clinically indicated.   Original Report Authenticated By: Tacey Ruiz, MD    EKG: Independently reviewed. 53 beats per minute, left axis deviation, sinus rhythm, Q waves noted in inferior leads and anterior precordial leads His Q waves are also present on EKG done on July 20011   Principal Problem:   Partial small bowel obstruction Active Problems:   ANEMIA   HYPERTENSION   VENOUS STASIS ULCER   GERD   DEGENERATIVE JOINT DISEASE, GENERALIZED   Assessment/Plan Patient is a 79 year old female with past medical history of previous partial small bowel obstruction(2.5 years ago) which was relieved with conservative therapy. I will admit this patient to MedSurg unit, put NG tube, make her n.p.o., IV fluids, pain control, symptomatic management with nausea control and hold her home medications(ace inhibitor, vitamins and lasix) at this time.  Gen. surgery was consulted by ER. Replete electrolytes. Check BMP in the morning.  Code Status: Full code Family Communication: Updated at bedside Disposition Plan/Anticipated LOS: Guarded  Time spent: 75 minutes  Lars Mage, MD  [facility] Team 5  If 7PM-7AM, please contact night-coverage at [email or web address], password [facility] 03/09/2013, 9:33 PM

## 2013-03-09 NOTE — ED Notes (Signed)
Bed:WA15<BR> Expected date:<BR> Expected time:<BR> Means of arrival:<BR> Comments:<BR>

## 2013-03-09 NOTE — ED Notes (Signed)
Pt was put on the bedpan and was able to urinate

## 2013-03-09 NOTE — ED Notes (Signed)
Per EMS-pt c/o of constipation x1 day. Nausea no vomiting. Hx bowel obstruction 2 years ago. Pain 10/10

## 2013-03-09 NOTE — ED Provider Notes (Signed)
History     CSN: [id number]  Arrival date & time 03/09/13  1544   First MD Initiated Contact with Patient 03/09/13 1608      Chief Complaint  Patient presents with  . Constipation  . Nausea  . Emesis    (Consider location/radiation/quality/duration/timing/severity/associated sxs/prior treatment) HPI Erin Gibson is a 79 y.o. female who presents to ED with complaint of nausea, vomiting, abdominal pain, dizziness, and two syncopal episodes today. States has issues with constipation, takes miralax twice a day. States in the last week, has not had a normal bowel movement, states they are small and hard. States today developed nausea, vomiting, increased abdominal pain, unable to use bathroom. States when was feeling bad, went and layed down. States twice vision went "black" she became dizzy and had sincopal episodes while laying down. States similar symptoms few years ago, states had a bowel obstruction. Did not have surgery forit.   Past Medical History  Diagnosis Date  . ANEMIA 03/31/2009  . DEGENERATIVE JOINT DISEASE, GENERALIZED 05/06/2007  . GERD 04/24/2007  . HYPERTENSION 04/24/2007  . VENOUS STASIS ULCER 07/20/2008  . DVT (deep venous thrombosis)     hx of  . Chronic kidney disease     Past Surgical History  Procedure Laterality Date  . Cataract extraction    . Abdominal hysterectomy    . Total knee arthroplasty      x2  . Hernia repair      No family history on file.  History  Substance Use Topics  . Smoking status: Never Smoker   . Smokeless tobacco: Never Used  . Alcohol Use: No    OB History   Grav Para Term Preterm Abortions TAB SAB Ect Mult Living                  Review of Systems  Constitutional: Negative for fever and chills.  HENT: Negative for neck pain and neck stiffness.   Respiratory: Negative for chest tightness and shortness of breath.   Cardiovascular: Negative for chest pain and leg swelling.  Gastrointestinal: Positive for nausea,  vomiting, abdominal pain and constipation. Negative for blood in stool.  Genitourinary: Negative for dysuria and flank pain.  Musculoskeletal: Negative.   Neurological: Positive for syncope, light-headedness and headaches. Negative for weakness.  All other systems reviewed and are negative.    Allergies  Codeine; Diltiazem hcl; and Ibuprofen  Home Medications   Current Outpatient Rx  Name  Route  Sig  Dispense  Refill  . aspirin 81 MG tablet   Oral   Take 81 mg by mouth daily.           . furosemide (LASIX) 40 MG tablet   Oral   Take 1 tablet (40 mg total) by mouth daily.   90 tablet   6   . iron polysaccharides (NIFEREX) 150 MG capsule   Oral   Take 150 mg by mouth daily.           . meclizine (ANTIVERT) 25 MG tablet   Oral   Take 25 mg by mouth 3 (three) times daily as needed.           Marland Kitchen omeprazole (PRILOSEC) 40 MG capsule   Oral   Take 1 capsule (40 mg total) by mouth daily.   90 capsule   6   . polyethylene glycol powder (GLYCOLAX/MIRALAX) powder   Oral   Take 17 g by mouth 2 (two) times daily.   2550 g  4     Please give 90 supply.   . potassium chloride SA (K-DUR,KLOR-CON) 20 MEQ tablet   Oral   Take 1 tablet (20 mEq total) by mouth daily.   90 tablet   6   . quinapril (ACCUPRIL) 40 MG tablet   Oral   Take 1 tablet (40 mg total) by mouth daily.   90 tablet   3   . quinapril (ACCUPRIL) 40 MG tablet      TAKE 1 TABLET DAILY   90 tablet   1   . traMADol (ULTRAM) 50 MG tablet   Oral   Take 1 tablet (50 mg total) by mouth every 8 (eight) hours as needed for pain.   90 tablet   1   . vitamin B-12 (CYANOCOBALAMIN) 1000 MCG tablet   Oral   Take 1,000 mcg by mouth daily.           . vitamin E (VITAMIN E) 400 UNIT capsule   Oral   Take 400 Units by mouth daily.             BP 190/71  Pulse 54  Temp(Src) 98 F (36.7 C) (Oral)  Resp 16  Ht 5' (1.524 m)  Wt 112 lb (50.803 kg)  BMI 21.87 kg/m2  SpO2 99%  Physical Exam   Nursing note and vitals reviewed. Constitutional: She is oriented to person, place, and time. She appears well-developed and well-nourished. No distress.  HENT:  Head: Normocephalic.  Eyes: Conjunctivae are normal.  Neck: Neck supple.  Cardiovascular: Normal rate and regular rhythm.   Murmur heard. Pulmonary/Chest: Effort normal and breath sounds normal. No respiratory distress. She has no wheezes. She has no rales.  Abdominal: Soft. Bowel sounds are normal. She exhibits no distension. There is tenderness. There is guarding. There is no rebound.  Musculoskeletal: She exhibits no edema.  Neurological: She is alert and oriented to person, place, and time.  Skin: Skin is warm and dry.    ED Course  Procedures (including critical care time)  Pt with abdominal pain, nausea, vomiting. Hx of SBO. Will get labs, x-ray  Results for orders placed during the hospital encounter of 03/09/13  CBC WITH DIFFERENTIAL      Result Value Range   WBC 8.6  4.0 - 10.5 K/uL   RBC 4.31  3.87 - 5.11 MIL/uL   Hemoglobin 11.8 (*) 12.0 - 15.0 g/dL   HCT 91.4  78.2 - 95.6 %   MCV 84.0  78.0 - 100.0 fL   MCH 27.4  26.0 - 34.0 pg   MCHC 32.6  30.0 - 36.0 g/dL   RDW 21.3  08.6 - 57.8 %   Platelets 222  150 - 400 K/uL   Neutrophils Relative % 89 (*) 43 - 77 %   Neutro Abs 7.6  1.7 - 7.7 K/uL   Lymphocytes Relative 7 (*) 12 - 46 %   Lymphs Abs 0.6 (*) 0.7 - 4.0 K/uL   Monocytes Relative 4  3 - 12 %   Monocytes Absolute 0.3  0.1 - 1.0 K/uL   Eosinophils Relative 0  0 - 5 %   Eosinophils Absolute 0.0  0.0 - 0.7 K/uL   Basophils Relative 0  0 - 1 %   Basophils Absolute 0.0  0.0 - 0.1 K/uL  COMPREHENSIVE METABOLIC PANEL      Result Value Range   Sodium 140  135 - 145 mEq/L   Potassium 4.4  3.5 - 5.1 mEq/L  Chloride 104  96 - 112 mEq/L   CO2 27  19 - 32 mEq/L   Glucose, Bld 117 (*) 70 - 99 mg/dL   BUN 24 (*) 6 - 23 mg/dL   Creatinine, Ser 1.61 (*) 0.50 - 1.10 mg/dL   Calcium 09.6  8.4 - 04.5 mg/dL    Total Protein 6.5  6.0 - 8.3 g/dL   Albumin 3.4 (*) 3.5 - 5.2 g/dL   AST 19  0 - 37 U/L   ALT 10  0 - 35 U/L   Alkaline Phosphatase 112  39 - 117 U/L   Total Bilirubin 0.7  0.3 - 1.2 mg/dL   GFR calc non Af Amer 40 (*) >90 mL/min   GFR calc Af Amer 46 (*) >90 mL/min  LIPASE, BLOOD      Result Value Range   Lipase 43  11 - 59 U/L  URINALYSIS W MICROSCOPIC + REFLEX CULTURE      Result Value Range   Color, Urine YELLOW  YELLOW   APPearance CLEAR  CLEAR   Specific Gravity, Urine 1.012  1.005 - 1.030   pH 6.0  5.0 - 8.0   Glucose, UA NEGATIVE  NEGATIVE mg/dL   Hgb urine dipstick NEGATIVE  NEGATIVE   Bilirubin Urine NEGATIVE  NEGATIVE   Ketones, ur NEGATIVE  NEGATIVE mg/dL   Protein, ur NEGATIVE  NEGATIVE mg/dL   Urobilinogen, UA 0.2  0.0 - 1.0 mg/dL   Nitrite POSITIVE (*) NEGATIVE   Leukocytes, UA SMALL (*) NEGATIVE   WBC, UA 3-6  <3 WBC/hpf   Bacteria, UA MANY (*) RARE   Squamous Epithelial / LPF RARE  RARE  POCT I-STAT TROPONIN I      Result Value Range   Troponin i, poc 0.01  0.00 - 0.08 ng/mL   Comment 3            Ct Abdomen Pelvis Wo Contrast  03/09/2013   *RADIOLOGY REPORT*  Clinical Data: Constipation, nausea and vomiting  CT ABDOMEN AND PELVIS WITHOUT CONTRAST  Technique:  Multidetector CT imaging of the abdomen and pelvis was performed following the standard protocol without intravenous contrast.  Comparison: CT abdomen pelvis - 04/20/2010; abdominal radiograph - 03/09/2013  Findings:  The lack of intravenous contrast limits the ability to evaluate solid abdominal organs. Examination is further degraded secondary to the lack of significant mesenteric fat.  Normal hepatic contour.  There is a trace amount of fluid adjacent to the right lobe of the liver.  Normal appearance of the medially located gallbladder.  The approximately 5.8 x 4.3 cm exophytic lesion with peripheral mural nodular calcification has minimally increased in size in the interval, previously, 5.5 x 4.1 cm.   There is a triangular-shaped increased opacification involving the mid/superior aspect of the left kidney (image 20, series 2) which may represent a small geographic area of medullary nephrocalcinosis.  No definite renal stones.  No urinary obstruction or perinephric stranding.  The bilateral adrenal glands are suboptimally evaluated on this noncontrast examination.  The pancreas appears atrophic.  Normal noncontrast appearance of the spleen.  Ingested enteric contrast extends to the level of the distal small bowel.  The proximal small bowel is mildly dilated with possible transition point within the left hemi pelvis (axial image 59, series 2).  This is associated relative decompression of the downstream small bowel.  Air is seen within the hepatic flexure of the colon though there is relative decompression of the colon. There is a small amount of  intra abdominal fluid with fluid seen within the pelvic cul-de-sac.  No pneumoperitoneum, pneumatosis or portal venous gas.   A left-sided presumed indirect inguinal hernia contains a portion of the descending colon but does not result in obstruction.  There is a small amount of fluid within the caudal most aspect of the hernia sac.  There is grossly unchanged mild aneurysmal dilatation of the infrarenal abdominal aorta measuring approximately 2.9 cm in greatest axial dimension (image 30, series 2).  Evaluation for intra-abdominal adenopathy is limited on this noncontrast examination.  Limited visualization of the lower thorax demonstrates persistent mild elevation/eventration of the left hemidiaphragm. There is unchanged partial atelectasis of the left lower lobe.  There is an unchanged approximate 4 mm nodule within the right costophrenic angle (image 6, series 6), unchanged since the 03/2010 examination and thus of benign etiology.  Borderline cardiomegaly.  Calcifications within the aortic valve annulus.  No pericardial effusion.  No acute or aggressive osseous  abnormalities.  Severe scoliotic curvature of the thoracolumbar spine with multilevel degenerative change.  Vertebral body heights appear grossly preserved given obliquity.  IMPRESSION:  1.  Mild to moderate upstream dilatation of the small bowel with possible transition point noted within the left hemi pelvis, worrisome for least a partial small bowel obstruction.  The etiology of this apparent obstruction is not depicted on this examination and thus is presumably secondary to adhesions. 2.  Small left sided presumably indirect inguinal hernia contains a small portion of the descending colon but does not definitely result in obstruction at this location. 3.  Small amount of intra-abdominal fluid without drainable fluid collection. 4.  Grossly unchanged ectasia of the infrarenal abdominal aorta measuring approximately 2.9 cm in diameter. 5.  The approximately 5.8 cm exophytic peripherally calcified right- sided renal lesion correlates with the findings on recent abdominal radiograph.  This lesion has minimally increased in size since remote abdominal CT performed 03/2010, previously, 5.5 cm and while is incompletely evaluated on this examination, is at least a Bosniak category IIF lesion.   Original Report Authenticated By: Tacey Ruiz, MD   Dg Chest Portable 1 View  03/09/2013   *RADIOLOGY REPORT*  Clinical Data: Constipation, nausea and emesis.  Evaluate nasogastric tube placement.  PORTABLE CHEST - 1 VIEW  Comparison: Acute abdominal series 03/09/2013.  Findings: Tip of nasogastric tube is near the gastroesophageal junction, with side port in the distal third of the esophagus. Lung volumes are low.  Linear opacities in the left base favored to predominately reflect subsegmental atelectasis, although the sequelae of recent aspiration is not excluded.  No definite pleural effusions.  Mild diffuse interstitial prominence and peribronchial cuffing.  Mild cephalization of the pulmonary vasculature. Thickening of  the minor fissure.  Heart size appears mildly enlarged. The patient is rotated to the left on today's exam, resulting in distortion of the mediastinal contours and reduced diagnostic sensitivity and specificity for mediastinal pathology. Atherosclerosis in the thoracic aorta.  IMPRESSION: 1.  Tip of nasogastric tube is at the gastroesophageal junction and could be advanced approximately 8-10 cm for more optimal placement. 2.  Worsening opacity in the left lower lobe which may reflect increasing atelectasis, however, sequelae of recent aspiration is not excluded.  Clinical correlation is recommended. 3.  Findings suggestive of developing mild interstitial pulmonary edema, as above. 4.  Atherosclerosis.   Original Report Authenticated By: Trudie Reed, M.D.   Dg Abd Acute W/chest  03/09/2013   *RADIOLOGY REPORT*  Clinical Data: Abdominal pain for 2 weeks,  nausea and vomiting, constipation  ACUTE ABDOMEN SERIES (ABDOMEN 2 VIEW & CHEST 1 VIEW)  Comparison: 04/23/2010; 04/21/2010;  Findings:  Enlarged cardiac silhouette and mediastinal contours.  The lungs appear mildly hyperexpanded with flattening of bilateral hemidiaphragms.  Minimal left basilar linear heterogeneous opacities are grossly unchanged.  No pleural effusion definite pneumothorax.  No definite evidence of edema.  There is moderate gaseous distension of multiple loops of colon. No pneumoperitoneum, pneumatosis or portal venous gas.  Laminar calcifications are seen within the right upper abdominal quadrant and favored to represent calcifications within the gallbladder wall.  Atherosclerotic plaque within the abdominal aorta.  There is possible mild ectasia of the abdominal aorta measuring approximately 3.2 cm in diameter.  Severe scoliotic curvature of the thoracolumbar spine. There is presumed chronic deformity of the lateral aspect of the right third rib. A bone island is seen within the left proximal humeral metaphysis.  IMPRESSION:  1.  Mild  gaseous distension of the colon suggestive of ileus.  No definite evidence of obstruction. 2.  The gallbladder wall appears calcified, similar to remote prior examinations.  Further evaluation with abdominal CT may be performed as clinically indicated.  3.  Cardiomegaly.  Further evaluation cardaiac echo may form as clinically indicated. 4.  Suspected mild aneurysmal dilatation of the abdominal aorta. Further evaluation with a non emergent aortic ultrasound may be performed as clinically indicated.  5.  Chronic left basilar/retrocardiac opacities, favored to represent atelectasis or scar. Further evaluation with a PA and lateral chest radiograph may be obtained as clinically indicated.   Original Report Authenticated By: Tacey Ruiz, MD     1. Partial small bowel obstruction   2. UTI (lower urinary tract infection)   3. Nausea & vomiting   4. Abdominal  pain, other specified site   5. Pain       MDM  Pt with nausea, vomiting, abdominal pain. Hx of SBO. Initial labs and acute abd series did not confirm any definitive process. CT abdomen obtained for further evaluation and showed partial SBO among other findings as described above. Pt's pain and symptoms improved with 25mg  iv fentanyl and 4mg  of zofran. NG tube placed. Admitted to medicine, with surgical consult. Dr. Michaell Cowing will see pt in consult.    Filed Vitals:   03/09/13 1553 03/09/13 2023 03/10/13 0047  BP: 190/71 133/52 135/48  Pulse: 54 55 51  Temp: 98 F (36.7 C)    TempSrc: Oral    Resp: 16 18 15   Height: 5' (1.524 m)    Weight: 112 lb (50.803 kg)    SpO2: 99%  91%       Chetan Mehring A Darrelyn Morro, PA-C 03/10/13 0113

## 2013-03-09 NOTE — ED Notes (Signed)
Patient transported to X-ray 

## 2013-03-10 ENCOUNTER — Inpatient Hospital Stay (HOSPITAL_COMMUNITY): Payer: Medicare Other

## 2013-03-10 DIAGNOSIS — Z4682 Encounter for fitting and adjustment of non-vascular catheter: Secondary | ICD-10-CM | POA: Diagnosis not present

## 2013-03-10 DIAGNOSIS — K56609 Unspecified intestinal obstruction, unspecified as to partial versus complete obstruction: Secondary | ICD-10-CM | POA: Diagnosis not present

## 2013-03-10 DIAGNOSIS — R109 Unspecified abdominal pain: Secondary | ICD-10-CM

## 2013-03-10 LAB — BASIC METABOLIC PANEL
BUN: 20 mg/dL (ref 6–23)
Calcium: 8.7 mg/dL (ref 8.4–10.5)
Creatinine, Ser: 1.07 mg/dL (ref 0.50–1.10)
GFR calc non Af Amer: 44 mL/min — ABNORMAL LOW (ref >90)
Glucose, Bld: 89 mg/dL (ref 70–99)

## 2013-03-10 MED ORDER — MORPHINE SULFATE 2 MG/ML IJ SOLN: 2.0000 mg | INTRAMUSCULAR | Status: DC | PRN

## 2013-03-10 MED ORDER — ONDANSETRON HCL 4 MG PO TABS: 4.0000 mg | ORAL_TABLET | Freq: Four times a day (QID) | ORAL | Status: DC | PRN

## 2013-03-10 MED ORDER — NITROGLYCERIN 0.4 MG SL SUBL: 0.4000 mg | SUBLINGUAL_TABLET | SUBLINGUAL | Status: DC | PRN

## 2013-03-10 MED ORDER — ONDANSETRON HCL 4 MG/2ML IJ SOLN: 4.0000 mg | Freq: Four times a day (QID) | INTRAMUSCULAR | Status: DC | PRN

## 2013-03-10 MED ORDER — PANTOPRAZOLE SODIUM 40 MG IV SOLR
40.0000 mg | Freq: Once | INTRAVENOUS | Status: AC
  Administered 2013-03-10: 40 mg via INTRAVENOUS
  Filled 2013-03-10: qty 40

## 2013-03-10 MED ORDER — POLYVINYL ALCOHOL 1.4 % OP SOLN
2.0000 [drp] | Freq: Two times a day (BID) | OPHTHALMIC | Status: DC
  Administered 2013-03-10 – 2013-03-12 (×5): 2 [drp] via OPHTHALMIC
  Filled 2013-03-10: qty 15

## 2013-03-10 MED ORDER — PANTOPRAZOLE SODIUM 40 MG IV SOLR
40.0000 mg | INTRAVENOUS | Status: DC
  Administered 2013-03-10 – 2013-03-11 (×2): 40 mg via INTRAVENOUS
  Filled 2013-03-10 (×3): qty 40

## 2013-03-10 MED ORDER — ENOXAPARIN SODIUM 30 MG/0.3ML ~~LOC~~ SOLN
30.0000 mg | SUBCUTANEOUS | Status: DC
  Administered 2013-03-10 – 2013-03-12 (×3): 30 mg via SUBCUTANEOUS
  Filled 2013-03-10 (×3): qty 0.3

## 2013-03-10 MED ORDER — SODIUM CHLORIDE 0.9 % IV SOLN
INTRAVENOUS | Status: DC
  Administered 2013-03-10: 1000 mL via INTRAVENOUS
  Administered 2013-03-10: 02:00:00 via INTRAVENOUS

## 2013-03-10 MED ORDER — SENNOSIDES-DOCUSATE SODIUM 8.6-50 MG PO TABS
1.0000 | ORAL_TABLET | Freq: Two times a day (BID) | ORAL | Status: DC
  Administered 2013-03-11 – 2013-03-12 (×3): 1 via ORAL
  Filled 2013-03-10 (×5): qty 1

## 2013-03-10 NOTE — Care Management (Addendum)
Patient:  Erin Gibson, Erin Gibson   Account Number:  [id number]  Date Initiated:  03/10/2013  Documentation initiated by:  Azucena Dart  Subjective/Objective Assessment:   79 yo female admitted with partial small bowel obstruction.     Action/Plan:   Home when stable   Anticipated DC Date:     Anticipated DC Plan:    In-house referral  NA      DC Planning Services  CM consult      Choice offered to / List presented to:  NA   DME arranged  RW      DME agency  [facility]     HH arranged  NA      HH agency  NA   Status of service:  In process, will continue to follow Medicare Important Message given?   (If response is "NO", the following Medicare IM given date fields will be blank) Date Medicare IM given:   Date Additional Medicare IM given:    Discharge Disposition:    Per UR Regulation:  Reviewed for med. necessity/level of care/duration of stay  If discussed at Long Length of Stay Meetings, dates discussed:    Comments:  03/10/13 1236 Sumit Branham,RN,BSN [phone] utilization of resources reviewed

## 2013-03-10 NOTE — Progress Notes (Signed)
Received order for rw.  Will be delivered to the patient's room prior to d/c.

## 2013-03-10 NOTE — Progress Notes (Signed)
Patient ID: Erin Gibson, female   DOB: 11/10/1918, 79 y.o.   MRN: [id number] [facility] PROGRESS NOTE  LATRISHA COIRO OZH:[id number] DOB: 03-23-1919 [name]: 03/09/2013 PCP: Rogelia Boga, MD  Brief narrative: Patient is a 78 year old female with past medical history most significant for degenerative joint disease, reflux disease, hypertension, chronic venous stasis, chronic kidney disease stage III - IV, and history of partial small bowel obstruction in the past (2.5 years ago) presented to ED with chief complaint of nausea, vomiting, abdominal pain and dizziness. Patient has chronic constipation and has been using MiraLax twice a day. Patient developed nausea and vomiting and threw up multiple times since breakfast in the morning. Vomitus contained food particles. Vomiting and nausea was associated with abdominal pain, generalized, aching in character, present in the upper middle to left abdomen, nonradiating, no exacerbating or relieving factors. Patient had been unable to move her bowels since morning. She also felt dizzy and almost passed out twice. She denies any fever, chills, chest pain, shortness of breath, change in diet, change in medications. Patient has been having progressive constipation over last 1 week. She lives alone and her daughter lives in [place], [place].   Principal Problem:   Partial small bowel obstruction, ileus  - clinically improving and feels better this AM - had small bowel movement this AM, continue supportive care for now - keep NGT in place and plan on clamping today and try ice chips and sips of water  - continue bowel regimen  Active Problems:   ANEMIA of chronic disease - slight drop in Hg noted since admission, likely dilutional component - overall stable and at pt's baseline  - CBC in AM   HYPERTENSION - reasonable inpatient control   Acute on chronic kidney disease, stage III - IV - based on GFR in 40's, creatinine generally stable and  within normal limits - initial rise in Cr on admission likely of pre renal etiology and secondary to dehydration - Cr is now within normal limits    VENOUS STASIS ULCER - stable, monitor    GERD - continue protonix    ? LLL opacity - noted on XRAY, ? Infiltrate and possible sequela of aspiration - pt is afebrile and no leukocytosis, will hold off on ABX for now and clinically pt stable and with low probability of an infectious etiology - however, if pt clinically deteriorates, repeat CXR can be obtained to determine need for ABX   Consultants:  None  Procedures/Studies: Ct Abdomen Pelvis Wo Contrast  03/09/2013    1.  Mild to moderate upstream dilatation of the small bowel with possible transition point noted within the left hemi pelvis, worrisome for least a partial small bowel obstruction.  The etiology of this apparent obstruction is not depicted on this examination and thus is presumably secondary to adhesions.  2.  Small left sided presumably indirect inguinal hernia contains a small portion of the descending colon but does not definitely result in obstruction at this location.  3.  Small amount of intra-abdominal fluid without drainable fluid collection.  4.  Grossly unchanged ectasia of the infrarenal abdominal aorta measuring approximately 2.9 cm in diameter.  5.  The approximately 5.8 cm exophytic peripherally calcified right- sided renal lesion correlates with the findings on recent abdominal radiograph.  This lesion has minimally increased in size since remote abdominal CT performed 03/2010, previously, 5.5 cm and while is incompletely evaluated on this examination, is at least a Bosniak category IIF lesion.  Dg Chest Portable 1 View 03/09/2013   1.  Tip of nasogastric tube is at the gastroesophageal junction and could be advanced approximately 8-10 cm for more optimal placement.  2.  Worsening opacity in the left lower lobe which may reflect increasing atelectasis, however,  sequelae of recent aspiration is not excluded.  Clinical correlation is recommended.  3.  Findings suggestive of developing mild interstitial pulmonary edema, as above.  4.  Atherosclerosis.    Dg Abd Acute W/chest 03/09/2013    1.  Mild gaseous distension of the colon suggestive of ileus.  No definite evidence of obstruction.  2.  The gallbladder wall appears calcified, similar to remote prior examinations.  Further evaluation with abdominal CT may be performed as clinically indicated.   3.  Cardiomegaly.  Further evaluation cardaiac echo may form as clinically indicated.  4.  Suspected mild aneurysmal dilatation of the abdominal aorta. Further evaluation with a non emergent aortic ultrasound may be performed as clinically indicated.   5.  Chronic left basilar/retrocardiac opacities, favored to represent atelectasis or scar.   Dg Abd Portable 1v 03/10/2013    Satisfactory position of nasogastric tube.    Antibiotics:  None  Code Status: Full Family Communication: Pt at bedside Disposition Plan: Home when medically stable  HPI/Subjective: No events overnight.   Objective: Filed Vitals:   03/09/13 2023 03/10/13 0047 03/10/13 0143 03/10/13 0630  BP: 133/52 135/48 153/48 150/56  Pulse: 55 51 51 62  Temp:   98.3 F (36.8 C) 98.4 F (36.9 C)  TempSrc:   Oral Oral  Resp: 18 15 20 20   Height:   5' (1.524 m)   Weight:      SpO2:  91% 97% 92%    Intake/Output Summary (Last 24 hours) at 03/10/13 0927 Last data filed at 03/10/13 0742  Gross per 24 hour  Intake      0 ml  Output   1200 ml  Net  -1200 ml    Exam:   General:  Pt is alert, follows commands appropriately, not in acute distress  Cardiovascular: Regular rhythm, bradycardic, S1/S2, no murmurs, no rubs, no gallops  Respiratory: Clear to auscultation bilaterally, no wheezing, no crackles, no rhonchi  Abdomen: Soft, non tender, non distended, bowel sounds present, no guarding, NGT in place   Extremities: No edema,  pulses DP and PT palpable bilaterally  Neuro: Grossly nonfocal  Data Reviewed: Basic Metabolic Panel:  Recent Labs Lab 03/09/13 1650 03/10/13 0356  NA 140 139  K 4.4 3.5  CL 104 108  CO2 27 25  GLUCOSE 117* 89  BUN 24* 20  CREATININE 1.16* 1.07  CALCIUM 10.0 8.7   Liver Function Tests:  Recent Labs Lab 03/09/13 1650  AST 19  ALT 10  ALKPHOS 112  BILITOT 0.7  PROT 6.5  ALBUMIN 3.4*    Recent Labs Lab 03/09/13 1650  LIPASE 43   CBC:  Recent Labs Lab 03/09/13 1650  WBC 8.6  NEUTROABS 7.6  HGB 11.8*  HCT 36.2  MCV 84.0  PLT 222   Scheduled Meds: . bisacodyl  10 mg Rectal Daily  . enoxaparin  injection  30 mg Subcutaneous Q24H  . lactated ringers  1,000 mL Intravenous Once  . pantoprazole IV  40 mg Intravenous Q24H   Continuous Infusions: . sodium chloride 75 mL/hr at 03/10/13 0137   Debbora Presto, MD  [facility] Pager [phone]  If 7PM-7AM, please contact night-coverage [email or web address] Password [email or web address] 03/10/2013, 9:27 AM   LOS: 1 day

## 2013-03-10 NOTE — Clinical Documentation Improvement (Signed)
CKD DOCUMENTATION CLARIFICATION QUERY   THIS DOCUMENT IS NOT A PERMANENT PART OF THE MEDICAL RECORD  TO RESPOND TO THE THIS QUERY, FOLLOW THE INSTRUCTIONS BELOW:  1. If needed, update documentation for the patient's encounter via the notes activity.  2. Access this query again and click edit on the In [employer].  3. After updating, or not, click F2 to complete all highlighted (required) fields concerning your review. Select "additional documentation in the medical record" OR "no additional documentation provided".  4. Click Sign note button.  5. The deficiency will fall out of your In Basket *Please let us know if you are not able to complete this workflow by phone or e-mail (listed below).  Please update your documentation within the medical record to reflect your response to this query.                                                                                        03/10/13   Dear Dr. Myers/Associates,  In a better effort to capture your patient's severity of illness, reflect appropriate length of stay and utilization of resources, a review of the patient medical record has revealed the following indicators.    Based on your clinical judgment, please clarify and document in a progress note and/or discharge summary the clinical condition associated with the following supporting information:  In responding to this query please exercise your independent judgment.  The fact that a query is asked, does not imply that any particular answer is desired or expected.    Possible Clinical Conditions?   _______CKD Stage III - GFR 30-59  _______CKD Stage IV - GFR 15-29  _______Other condition  _______Cannot Clinically determine      Risk Factors:  CKD noted per 6/16 progress notes.   Lab:  Bun: 6/17:  20 6/16:  24  Creat: 6/17:  1.07 6/16:  1.16  GFR: 6/17:  44 6/16:  40    You may use possible, probable, or suspect with inpatient documentation. possible,  probable, suspected diagnoses MUST be documented at the time of discharge  Reviewed: Addressed in progress note 03/11/2103, thank you  Thank You,  Marciano Sequin,  Clinical Documentation Specialist:  Phone: [phone]  Health Information Management [place]

## 2013-03-10 NOTE — Progress Notes (Signed)
Patient ID: Erin Gibson, female   DOB: [birth year], 79 y.o.   MRN: [id number]    Subjective: Pt feels better today.  Passing some flatus.  Less tenderness  Objective: Vital signs in last 24 hours: Temp:  [98 F (36.7 C)-98.4 F (36.9 C)] 98.4 F (36.9 C) (06/17 0630) Pulse Rate:  [51-62] 62 (06/17 0630) Resp:  [15-20] 20 (06/17 0630) BP: (133-190)/(48-71) 150/56 mmHg (06/17 0630) SpO2:  [91 %-99 %] 92 % (06/17 0630) Weight:  [112 lb (50.803 kg)] 112 lb (50.803 kg) (06/16 1553) Last BM Date: 03/09/13  Intake/Output from previous day: 06/16 0701 - 06/17 0700 In: -  Out: 850 [Urine:850] Intake/Output this shift: Total I/O In: -  Out: 350 [Urine:300; Emesis/NG output:50]  PE: Abd: soft, less distention, left inguinal hernia reducible, some stool palpable in colon, NGT with no output, +BS  Lab Results:   Recent Labs  03/09/13 1650  WBC 8.6  HGB 11.8*  HCT 36.2  PLT 222   BMET  Recent Labs  03/09/13 1650 03/10/13 0356  NA 140 139  K 4.4 3.5  CL 104 108  CO2 27 25  GLUCOSE 117* 89  BUN 24* 20  CREATININE 1.16* 1.07  CALCIUM 10.0 8.7   PT/INR No results found for this basename: LABPROT, INR,  in the last 72 hours CMP     Component Value Date/Time   NA 139 03/10/2013 0356   K 3.5 03/10/2013 0356   CL 108 03/10/2013 0356   CO2 25 03/10/2013 0356   GLUCOSE 89 03/10/2013 0356   GLUCOSE 89 10/02/2006 1104   BUN 20 03/10/2013 0356   CREATININE 1.07 03/10/2013 0356   CALCIUM 8.7 03/10/2013 0356   PROT 6.5 03/09/2013 1650   ALBUMIN 3.4* 03/09/2013 1650   AST 19 03/09/2013 1650   ALT 10 03/09/2013 1650   ALKPHOS 112 03/09/2013 1650   BILITOT 0.7 03/09/2013 1650   GFRNONAA 44* 03/10/2013 0356   GFRAA 51* 03/10/2013 0356   Lipase     Component Value Date/Time   LIPASE 43 03/09/2013 1650       Studies/Results: Ct Abdomen Pelvis Wo Contrast  03/09/2013   *RADIOLOGY REPORT*  Clinical Data: Constipation, nausea and vomiting  CT ABDOMEN AND PELVIS WITHOUT CONTRAST   Technique:  Multidetector CT imaging of the abdomen and pelvis was performed following the standard protocol without intravenous contrast.  Comparison: CT abdomen pelvis - 04/20/2010; abdominal radiograph - 03/09/2013  Findings:  The lack of intravenous contrast limits the ability to evaluate solid abdominal organs. Examination is further degraded secondary to the lack of significant mesenteric fat.  Normal hepatic contour.  There is a trace amount of fluid adjacent to the right lobe of the liver.  Normal appearance of the medially located gallbladder.  The approximately 5.8 x 4.3 cm exophytic lesion with peripheral mural nodular calcification has minimally increased in size in the interval, previously, 5.5 x 4.1 cm.  There is a triangular-shaped increased opacification involving the mid/superior aspect of the left kidney (image 20, series 2) which may represent a small geographic area of medullary nephrocalcinosis.  No definite renal stones.  No urinary obstruction or perinephric stranding.  The bilateral adrenal glands are suboptimally evaluated on this noncontrast examination.  The pancreas appears atrophic.  Normal noncontrast appearance of the spleen.  Ingested enteric contrast extends to the level of the distal small bowel.  The proximal small bowel is mildly dilated with possible transition point within the left hemi pelvis (axial image 59,  series 2).  This is associated relative decompression of the downstream small bowel.  Air is seen within the hepatic flexure of the colon though there is relative decompression of the colon. There is a small amount of intra abdominal fluid with fluid seen within the pelvic cul-de-sac.  No pneumoperitoneum, pneumatosis or portal venous gas.   A left-sided presumed indirect inguinal hernia contains a portion of the descending colon but does not result in obstruction.  There is a small amount of fluid within the caudal most aspect of the hernia sac.  There is grossly  unchanged mild aneurysmal dilatation of the infrarenal abdominal aorta measuring approximately 2.9 cm in greatest axial dimension (image 30, series 2).  Evaluation for intra-abdominal adenopathy is limited on this noncontrast examination.  Limited visualization of the lower thorax demonstrates persistent mild elevation/eventration of the left hemidiaphragm. There is unchanged partial atelectasis of the left lower lobe.  There is an unchanged approximate 4 mm nodule within the right costophrenic angle (image 6, series 6), unchanged since the 03/2010 examination and thus of benign etiology.  Borderline cardiomegaly.  Calcifications within the aortic valve annulus.  No pericardial effusion.  No acute or aggressive osseous abnormalities.  Severe scoliotic curvature of the thoracolumbar spine with multilevel degenerative change.  Vertebral body heights appear grossly preserved given obliquity.  IMPRESSION:  1.  Mild to moderate upstream dilatation of the small bowel with possible transition point noted within the left hemi pelvis, worrisome for least a partial small bowel obstruction.  The etiology of this apparent obstruction is not depicted on this examination and thus is presumably secondary to adhesions. 2.  Small left sided presumably indirect inguinal hernia contains a small portion of the descending colon but does not definitely result in obstruction at this location. 3.  Small amount of intra-abdominal fluid without drainable fluid collection. 4.  Grossly unchanged ectasia of the infrarenal abdominal aorta measuring approximately 2.9 cm in diameter. 5.  The approximately 5.8 cm exophytic peripherally calcified right- sided renal lesion correlates with the findings on recent abdominal radiograph.  This lesion has minimally increased in size since remote abdominal CT performed 03/2010, previously, 5.5 cm and while is incompletely evaluated on this examination, is at least a Bosniak category IIF lesion.   Original  Report Authenticated By: Tacey Ruiz, MD   Dg Chest Portable 1 View  03/09/2013   *RADIOLOGY REPORT*  Clinical Data: Constipation, nausea and emesis.  Evaluate nasogastric tube placement.  PORTABLE CHEST - 1 VIEW  Comparison: Acute abdominal series 03/09/2013.  Findings: Tip of nasogastric tube is near the gastroesophageal junction, with side port in the distal third of the esophagus. Lung volumes are low.  Linear opacities in the left base favored to predominately reflect subsegmental atelectasis, although the sequelae of recent aspiration is not excluded.  No definite pleural effusions.  Mild diffuse interstitial prominence and peribronchial cuffing.  Mild cephalization of the pulmonary vasculature. Thickening of the minor fissure.  Heart size appears mildly enlarged. The patient is rotated to the left on today's exam, resulting in distortion of the mediastinal contours and reduced diagnostic sensitivity and specificity for mediastinal pathology. Atherosclerosis in the thoracic aorta.  IMPRESSION: 1.  Tip of nasogastric tube is at the gastroesophageal junction and could be advanced approximately 8-10 cm for more optimal placement. 2.  Worsening opacity in the left lower lobe which may reflect increasing atelectasis, however, sequelae of recent aspiration is not excluded.  Clinical correlation is recommended. 3.  Findings suggestive of developing  mild interstitial pulmonary edema, as above. 4.  Atherosclerosis.   Original Report Authenticated By: Trudie Reed, M.D.   Dg Abd Acute W/chest  03/09/2013   *RADIOLOGY REPORT*  Clinical Data: Abdominal pain for 2 weeks, nausea and vomiting, constipation  ACUTE ABDOMEN SERIES (ABDOMEN 2 VIEW & CHEST 1 VIEW)  Comparison: 04/23/2010; 04/21/2010;  Findings:  Enlarged cardiac silhouette and mediastinal contours.  The lungs appear mildly hyperexpanded with flattening of bilateral hemidiaphragms.  Minimal left basilar linear heterogeneous opacities are grossly  unchanged.  No pleural effusion definite pneumothorax.  No definite evidence of edema.  There is moderate gaseous distension of multiple loops of colon. No pneumoperitoneum, pneumatosis or portal venous gas.  Laminar calcifications are seen within the right upper abdominal quadrant and favored to represent calcifications within the gallbladder wall.  Atherosclerotic plaque within the abdominal aorta.  There is possible mild ectasia of the abdominal aorta measuring approximately 3.2 cm in diameter.  Severe scoliotic curvature of the thoracolumbar spine. There is presumed chronic deformity of the lateral aspect of the right third rib. A bone island is seen within the left proximal humeral metaphysis.  IMPRESSION:  1.  Mild gaseous distension of the colon suggestive of ileus.  No definite evidence of obstruction. 2.  The gallbladder wall appears calcified, similar to remote prior examinations.  Further evaluation with abdominal CT may be performed as clinically indicated.  3.  Cardiomegaly.  Further evaluation cardaiac echo may form as clinically indicated. 4.  Suspected mild aneurysmal dilatation of the abdominal aorta. Further evaluation with a non emergent aortic ultrasound may be performed as clinically indicated.  5.  Chronic left basilar/retrocardiac opacities, favored to represent atelectasis or scar. Further evaluation with a PA and lateral chest radiograph may be obtained as clinically indicated.   Original Report Authenticated By: Tacey Ruiz, MD   Dg Abd Portable 1v  03/10/2013   *RADIOLOGY REPORT*  Clinical Data: NG tube placement  PORTABLE ABDOMEN - 1 VIEW  Comparison: CT abdomen pelvis 03/09/2013  Findings: Nasogastric tube terminates in the stomach, and satisfactory position.  Contrast material is seen within the proximal colon.  Diffuse mild gaseous distention of small bowel loops.  Moderate convex left scoliosis of the lumbar spine.  IMPRESSION: Satisfactory position of nasogastric tube.   Original  Report Authenticated By: Britta Mccreedy, M.D.    Anti-infectives: Anti-infectives   Start     Dose/Rate Route Frequency Ordered Stop   03/09/13 1945  cefTRIAXone (ROCEPHIN) 1 g in dextrose 5 % 50 mL IVPB     1 g 100 mL/hr over 30 Minutes Intravenous  Once 03/09/13 1938 03/09/13 2038       Assessment/Plan  1. PSBO, resolving.  Contrast present in colon on x-rays  Plan: 1. Will dc NGT and allow chips and sips 2. Mobilize patient.   LOS: 1 day    Latasha Puskas E 03/10/2013, 9:01 AM Pager: [phone]

## 2013-03-11 DIAGNOSIS — I83009 Varicose veins of unspecified lower extremity with ulcer of unspecified site: Secondary | ICD-10-CM

## 2013-03-11 DIAGNOSIS — K219 Gastro-esophageal reflux disease without esophagitis: Secondary | ICD-10-CM | POA: Diagnosis not present

## 2013-03-11 DIAGNOSIS — K56609 Unspecified intestinal obstruction, unspecified as to partial versus complete obstruction: Secondary | ICD-10-CM | POA: Diagnosis not present

## 2013-03-11 DIAGNOSIS — L97909 Non-pressure chronic ulcer of unspecified part of unspecified lower leg with unspecified severity: Secondary | ICD-10-CM

## 2013-03-11 DIAGNOSIS — D649 Anemia, unspecified: Secondary | ICD-10-CM | POA: Diagnosis not present

## 2013-03-11 LAB — URINE CULTURE: Colony Count: >=100000

## 2013-03-11 LAB — BASIC METABOLIC PANEL
Calcium: 9.3 mg/dL (ref 8.4–10.5)
GFR calc Af Amer: 49 mL/min — ABNORMAL LOW (ref >90)
GFR calc non Af Amer: 43 mL/min — ABNORMAL LOW (ref >90)
Glucose, Bld: 46 mg/dL — ABNORMAL LOW (ref 70–99)
Sodium: 142 mEq/L (ref 135–145)

## 2013-03-11 LAB — CBC
Hemoglobin: 10 g/dL — ABNORMAL LOW (ref 12.0–15.0)
MCH: 27.1 pg (ref 26.0–34.0)
MCHC: 31.7 g/dL (ref 30.0–36.0)
RDW: 15.5 % (ref 11.5–15.5)

## 2013-03-11 MED ORDER — POTASSIUM CHLORIDE CRYS ER 20 MEQ PO TBCR
20.0000 meq | EXTENDED_RELEASE_TABLET | Freq: Two times a day (BID) | ORAL | Status: DC
  Administered 2013-03-11 – 2013-03-12 (×3): 20 meq via ORAL
  Filled 2013-03-11 (×4): qty 1

## 2013-03-11 MED ORDER — TRAMADOL HCL 50 MG PO TABS
50.0000 mg | ORAL_TABLET | Freq: Four times a day (QID) | ORAL | Status: AC | PRN
  Administered 2013-03-12 (×2): 50 mg via ORAL
  Filled 2013-03-11 (×2): qty 1

## 2013-03-11 NOTE — Progress Notes (Addendum)
Clinical Social Work Department BRIEF PSYCHOSOCIAL ASSESSMENT 03/12/2013  Patient:  Erin Gibson, Erin Gibson     Account Number:  [id number]     Admit date:  03/09/2013  Clinical Social Worker:  Jacelyn Grip  Date/Time:  03/11/2013 04:30 PM  Referred by:  RN  Date Referred:  03/11/2013 Referred for  SNF Placement   Other Referral:   Interview type:  Patient Other interview type:   and patient daughter at bedside    PSYCHOSOCIAL DATA Living Status:  ALONE Admitted from facility:   Level of care:   Primary support name:  Erin Gibson/daughter Primary support relationship to patient:  CHILD, ADULT Degree of support available:   strong    CURRENT CONCERNS Current Concerns  Post-Acute Placement   Other Concerns:    SOCIAL WORK ASSESSMENT / PLAN CSW received referral for New SNF.    CSW met with pt and pt daughter, Erin Gibson at bedside to discuss.    Pt is eager to return home, but recognizes need for rehab and is agreeable to ST SNF placement.    Pt agreeable to initiation of SNF search to [facility] and [facility].    Pt was interested in information about LifeAlert and Private Duty Care Sitters as pt is planning to return home and would like to start looking into these types of services in order to remain in her home. CSW provided information on both.    CSW completed FL2 and initiation SNF search to [facility] and [facility].    CSW to follow up with pt re: bed offers.    CSW to facilitate pt d/c needs when pt medically stable for d/c.   Assessment/plan status:  Psychosocial Support/Ongoing Assessment of Needs Other assessment/ plan:   discharge planning   Information/referral to community resources:   Referral to [facility] and [facility].  Life Alert information  Private Duty Care list    PATIENT'S/FAMILY'S RESPONSE TO PLAN OF CARE: Pt alert and oriented x 4. Pt would really like to return home, but recognizes  need for ST SNF to get stronger as pt lives alone. Pt daughter is supportive and actively involved in pt care.    Jacklynn Lewis, MSW, LCSWA  Clinical Social Work [phone]

## 2013-03-11 NOTE — Progress Notes (Signed)
[facility] PROGRESS NOTE  ALESE FURNISS YQM:[id number] DOB: [birth year] [name]: 03/09/2013 PCP: Rogelia Boga, MD  Brief narrative:  Patient is a 79 year old female with past medical history most significant for degenerative joint disease, reflux disease, hypertension, chronic venous stasis, chronic kidney disease stage III - IV, and history of partial small bowel obstruction in the past (2.5 years ago) presented to ED with chief complaint of nausea, vomiting, abdominal pain and dizziness. Patient has chronic constipation and has been using MiraLax twice a day. Patient developed nausea and vomiting and threw up multiple times since breakfast in the morning. Vomitus contained food particles. Vomiting and nausea was associated with abdominal pain, generalized, aching in character, present in the upper middle to left abdomen, nonradiating, no exacerbating or relieving factors. Patient had been unable to move her bowels since morning. She also felt dizzy and almost passed out twice. She denies any fever, chills, chest pain, shortness of breath, change in diet, change in medications. Patient has been having progressive constipation over last 1 week. She lives alone and her daughter lives in [place], [place].   Assessment/Plan: 1. Partial small bowel obstruction, ileus- resolved, started on clear liquid diet. Surgery has now signed off. NG is now out. Will advance the diet as tolerated. 2. Anemia of chronic disease- stable. Hemoglobin is at baseline. 3. Hypertension- BP is stable. 4. CKD stage III- Patient's creatinine is at baseline. 5. UTI- urine culture growing klebsiella, will start bactrim    Code Status: Full code Family Communication: Discussed with brother at the bedside Disposition Plan: Home when stable   Consultants:  Surgery  Procedures:  None  Antibiotics:  None  HPI/Subjective: Patient seen and examined, no new complaints.  Objective: Filed Vitals:   03/10/13 0143 03/10/13 0630 03/10/13 2100 03/11/13 0500  BP: 153/48 150/56 179/51 144/57  Pulse: 51 62 74 91  Temp: 98.3 F (36.8 C) 98.4 F (36.9 C) 97.9 F (36.6 C) 98 F (36.7 C)  TempSrc: Oral Oral Oral Oral  Resp: 20 20 16 18   Height: 5' (1.524 m)     Weight:      SpO2: 97% 92% 99% 96%    Intake/Output Summary (Last 24 hours) at 03/11/13 1234 Last data filed at 03/11/13 1100  Gross per 24 hour  Intake   1140 ml  Output    900 ml  Net    240 ml   Filed Weights   03/09/13 1553  Weight: 50.803 kg (112 lb)    Exam:   General:  Appear in no acute distress  Cardiovascular: S1S2 RRR  Respiratory: Clear bilaterally  Abdomen: Soft, nontender  Musculoskeletal: No edema  Data Reviewed: Basic Metabolic Panel:  Recent Labs Lab 03/09/13 1650 03/10/13 0356 03/11/13 0347  NA 140 139 142  K 4.4 3.5 3.4*  CL 104 108 107  CO2 27 25 17*  GLUCOSE 117* 89 46*  BUN 24* 20 20  CREATININE 1.16* 1.07 1.09  CALCIUM 10.0 8.7 9.3   Liver Function Tests:  Recent Labs Lab 03/09/13 1650  AST 19  ALT 10  ALKPHOS 112  BILITOT 0.7  PROT 6.5  ALBUMIN 3.4*    Recent Labs Lab 03/09/13 1650  LIPASE 43   No results found for this basename: AMMONIA,  in the last 168 hours CBC:  Recent Labs Lab 03/09/13 1650 03/11/13 0347  WBC 8.6 4.8  NEUTROABS 7.6  --   HGB 11.8* 10.0*  HCT 36.2 31.5*  MCV 84.0 85.4  PLT 222 169  Cardiac Enzymes: No results found for this basename: CKTOTAL, CKMB, CKMBINDEX, TROPONINI,  in the last 168 hours BNP (last 3 results) No results found for this basename: PROBNP,  in the last 8760 hours CBG: No results found for this basename: GLUCAP,  in the last 168 hours  Recent Results (from the past 240 hour(s))  URINE CULTURE     Status: None   Collection Time    03/09/13  6:20 PM      Result Value Range Status   Specimen Description URINE, CLEAN CATCH   Final   Special Requests NONE   Final   Culture  Setup Time 03/09/2013 22:00    Final   Colony Count >=100,000 COLONIES/ML   Final   Culture KLEBSIELLA PNEUMONIAE   Final   Report Status 03/11/2013 FINAL   Final   Organism ID, Bacteria KLEBSIELLA PNEUMONIAE   Final     Studies: Ct Abdomen Pelvis Wo Contrast 03/09/2013  1. Mild to moderate upstream dilatation of the small bowel with possible transition point noted within the left hemi pelvis, worrisome for least a partial small bowel obstruction. The etiology of this apparent obstruction is not depicted on this examination and thus is presumably secondary to adhesions.  2. Small left sided presumably indirect inguinal hernia contains a small portion of the descending colon but does not definitely result in obstruction at this location.  3. Small amount of intra-abdominal fluid without drainable fluid collection.  4. Grossly unchanged ectasia of the infrarenal abdominal aorta measuring approximately 2.9 cm in diameter.  5. The approximately 5.8 cm exophytic peripherally calcified right- sided renal lesion correlates with the findings on recent abdominal radiograph. This lesion has minimally increased in size since remote abdominal CT performed 03/2010, previously, 5.5 cm and while is incompletely evaluated on this examination, is at least a Bosniak category IIF lesion.  Dg Chest Portable 1 View 03/09/2013  1. Tip of nasogastric tube is at the gastroesophageal junction and could be advanced approximately 8-10 cm for more optimal placement.  2. Worsening opacity in the left lower lobe which may reflect increasing atelectasis, however, sequelae of recent aspiration is not excluded. Clinical correlation is recommended.  3. Findings suggestive of developing mild interstitial pulmonary edema, as above.  4. Atherosclerosis.  Dg Abd Acute W/chest 03/09/2013  1. Mild gaseous distension of the colon suggestive of ileus. No definite evidence of obstruction.  2. The gallbladder wall appears calcified, similar to remote prior examinations.  Further evaluation with abdominal CT may be performed as clinically indicated.  3. Cardiomegaly. Further evaluation cardaiac echo may form as clinically indicated.  4. Suspected mild aneurysmal dilatation of the abdominal aorta. Further evaluation with a non emergent aortic ultrasound may be performed as clinically indicated.  5. Chronic left basilar/retrocardiac opacities, favored to represent atelectasis or scar.  Dg Abd Portable 1v 03/10/2013  Satisfactory position of nasogastric tube.     Principal Problem:   Partial small bowel obstruction Active Problems:   ANEMIA   HYPERTENSION   VENOUS STASIS ULCER   GERD   DEGENERATIVE JOINT DISEASE, GENERALIZED   Inguinal hernia, left - Contains sigmoid colon.  Reducible    Time spent: 25 min    [facility]  [facility] Pager [phone]. If 7PM-7AM, please contact night-coverage at [email or web address], password [facility] 03/11/2013, 12:34 PM  LOS: 2 days

## 2013-03-11 NOTE — Progress Notes (Signed)
Patient ID: Erin Gibson, female   DOB: 08-21-1919, 79 y.o.   MRN: [id number]    Subjective: C/o being tired, otherwise feels well.  Tolerating NG out with sips of liquids.  No nausea  Objective: Vital signs in last 24 hours: Temp:  [97.9 F (36.6 C)-98 F (36.7 C)] 98 F (36.7 C) (06/18 0500) Pulse Rate:  [74-91] 91 (06/18 0500) Resp:  [16-18] 18 (06/18 0500) BP: (144-179)/(51-57) 144/57 mmHg (06/18 0500) SpO2:  [96 %-99 %] 96 % (06/18 0500) Last BM Date: 03/10/13  Intake/Output from previous day: 06/17 0701 - 06/18 0700 In: 1140 [P.O.:240; I.V.:900] Out: 950 [Urine:900; Emesis/NG output:50] Intake/Output this shift:    PE: Abd: soft, ND, NT, +BS  Lab Results:   Recent Labs  03/09/13 1650 03/11/13 0347  WBC 8.6 4.8  HGB 11.8* 10.0*  HCT 36.2 31.5*  PLT 222 169   BMET  Recent Labs  03/10/13 0356 03/11/13 0347  NA 139 142  K 3.5 3.4*  CL 108 107  CO2 25 17*  GLUCOSE 89 46*  BUN 20 20  CREATININE 1.07 1.09  CALCIUM 8.7 9.3   PT/INR No results found for this basename: LABPROT, INR,  in the last 72 hours CMP     Component Value Date/Time   NA 142 03/11/2013 0347   K 3.4* 03/11/2013 0347   CL 107 03/11/2013 0347   CO2 17* 03/11/2013 0347   GLUCOSE 46* 03/11/2013 0347   GLUCOSE 89 10/02/2006 1104   BUN 20 03/11/2013 0347   CREATININE 1.09 03/11/2013 0347   CALCIUM 9.3 03/11/2013 0347   PROT 6.5 03/09/2013 1650   ALBUMIN 3.4* 03/09/2013 1650   AST 19 03/09/2013 1650   ALT 10 03/09/2013 1650   ALKPHOS 112 03/09/2013 1650   BILITOT 0.7 03/09/2013 1650   GFRNONAA 43* 03/11/2013 0347   GFRAA 49* 03/11/2013 0347   Lipase     Component Value Date/Time   LIPASE 43 03/09/2013 1650       Studies/Results: Ct Abdomen Pelvis Wo Contrast  03/09/2013   *RADIOLOGY REPORT*  Clinical Data: Constipation, nausea and vomiting  CT ABDOMEN AND PELVIS WITHOUT CONTRAST  Technique:  Multidetector CT imaging of the abdomen and pelvis was performed following the standard protocol  without intravenous contrast.  Comparison: CT abdomen pelvis - 04/20/2010; abdominal radiograph - 03/09/2013  Findings:  The lack of intravenous contrast limits the ability to evaluate solid abdominal organs. Examination is further degraded secondary to the lack of significant mesenteric fat.  Normal hepatic contour.  There is a trace amount of fluid adjacent to the right lobe of the liver.  Normal appearance of the medially located gallbladder.  The approximately 5.8 x 4.3 cm exophytic lesion with peripheral mural nodular calcification has minimally increased in size in the interval, previously, 5.5 x 4.1 cm.  There is a triangular-shaped increased opacification involving the mid/superior aspect of the left kidney (image 20, series 2) which may represent a small geographic area of medullary nephrocalcinosis.  No definite renal stones.  No urinary obstruction or perinephric stranding.  The bilateral adrenal glands are suboptimally evaluated on this noncontrast examination.  The pancreas appears atrophic.  Normal noncontrast appearance of the spleen.  Ingested enteric contrast extends to the level of the distal small bowel.  The proximal small bowel is mildly dilated with possible transition point within the left hemi pelvis (axial image 59, series 2).  This is associated relative decompression of the downstream small bowel.  Air is seen within  the hepatic flexure of the colon though there is relative decompression of the colon. There is a small amount of intra abdominal fluid with fluid seen within the pelvic cul-de-sac.  No pneumoperitoneum, pneumatosis or portal venous gas.   A left-sided presumed indirect inguinal hernia contains a portion of the descending colon but does not result in obstruction.  There is a small amount of fluid within the caudal most aspect of the hernia sac.  There is grossly unchanged mild aneurysmal dilatation of the infrarenal abdominal aorta measuring approximately 2.9 cm in greatest  axial dimension (image 30, series 2).  Evaluation for intra-abdominal adenopathy is limited on this noncontrast examination.  Limited visualization of the lower thorax demonstrates persistent mild elevation/eventration of the left hemidiaphragm. There is unchanged partial atelectasis of the left lower lobe.  There is an unchanged approximate 4 mm nodule within the right costophrenic angle (image 6, series 6), unchanged since the 03/2010 examination and thus of benign etiology.  Borderline cardiomegaly.  Calcifications within the aortic valve annulus.  No pericardial effusion.  No acute or aggressive osseous abnormalities.  Severe scoliotic curvature of the thoracolumbar spine with multilevel degenerative change.  Vertebral body heights appear grossly preserved given obliquity.  IMPRESSION:  1.  Mild to moderate upstream dilatation of the small bowel with possible transition point noted within the left hemi pelvis, worrisome for least a partial small bowel obstruction.  The etiology of this apparent obstruction is not depicted on this examination and thus is presumably secondary to adhesions. 2.  Small left sided presumably indirect inguinal hernia contains a small portion of the descending colon but does not definitely result in obstruction at this location. 3.  Small amount of intra-abdominal fluid without drainable fluid collection. 4.  Grossly unchanged ectasia of the infrarenal abdominal aorta measuring approximately 2.9 cm in diameter. 5.  The approximately 5.8 cm exophytic peripherally calcified right- sided renal lesion correlates with the findings on recent abdominal radiograph.  This lesion has minimally increased in size since remote abdominal CT performed 03/2010, previously, 5.5 cm and while is incompletely evaluated on this examination, is at least a Bosniak category IIF lesion.   Original Report Authenticated By: Tacey Ruiz, MD   Dg Chest Portable 1 View  03/09/2013   *RADIOLOGY REPORT*  Clinical  Data: Constipation, nausea and emesis.  Evaluate nasogastric tube placement.  PORTABLE CHEST - 1 VIEW  Comparison: Acute abdominal series 03/09/2013.  Findings: Tip of nasogastric tube is near the gastroesophageal junction, with side port in the distal third of the esophagus. Lung volumes are low.  Linear opacities in the left base favored to predominately reflect subsegmental atelectasis, although the sequelae of recent aspiration is not excluded.  No definite pleural effusions.  Mild diffuse interstitial prominence and peribronchial cuffing.  Mild cephalization of the pulmonary vasculature. Thickening of the minor fissure.  Heart size appears mildly enlarged. The patient is rotated to the left on today's exam, resulting in distortion of the mediastinal contours and reduced diagnostic sensitivity and specificity for mediastinal pathology. Atherosclerosis in the thoracic aorta.  IMPRESSION: 1.  Tip of nasogastric tube is at the gastroesophageal junction and could be advanced approximately 8-10 cm for more optimal placement. 2.  Worsening opacity in the left lower lobe which may reflect increasing atelectasis, however, sequelae of recent aspiration is not excluded.  Clinical correlation is recommended. 3.  Findings suggestive of developing mild interstitial pulmonary edema, as above. 4.  Atherosclerosis.   Original Report Authenticated By: Trudie Reed, M.D.  Dg Abd Acute W/chest  03/09/2013   *RADIOLOGY REPORT*  Clinical Data: Abdominal pain for 2 weeks, nausea and vomiting, constipation  ACUTE ABDOMEN SERIES (ABDOMEN 2 VIEW & CHEST 1 VIEW)  Comparison: 04/23/2010; 04/21/2010;  Findings:  Enlarged cardiac silhouette and mediastinal contours.  The lungs appear mildly hyperexpanded with flattening of bilateral hemidiaphragms.  Minimal left basilar linear heterogeneous opacities are grossly unchanged.  No pleural effusion definite pneumothorax.  No definite evidence of edema.  There is moderate gaseous  distension of multiple loops of colon. No pneumoperitoneum, pneumatosis or portal venous gas.  Laminar calcifications are seen within the right upper abdominal quadrant and favored to represent calcifications within the gallbladder wall.  Atherosclerotic plaque within the abdominal aorta.  There is possible mild ectasia of the abdominal aorta measuring approximately 3.2 cm in diameter.  Severe scoliotic curvature of the thoracolumbar spine. There is presumed chronic deformity of the lateral aspect of the right third rib. A bone island is seen within the left proximal humeral metaphysis.  IMPRESSION:  1.  Mild gaseous distension of the colon suggestive of ileus.  No definite evidence of obstruction. 2.  The gallbladder wall appears calcified, similar to remote prior examinations.  Further evaluation with abdominal CT may be performed as clinically indicated.  3.  Cardiomegaly.  Further evaluation cardaiac echo may form as clinically indicated. 4.  Suspected mild aneurysmal dilatation of the abdominal aorta. Further evaluation with a non emergent aortic ultrasound may be performed as clinically indicated.  5.  Chronic left basilar/retrocardiac opacities, favored to represent atelectasis or scar. Further evaluation with a PA and lateral chest radiograph may be obtained as clinically indicated.   Original Report Authenticated By: Tacey Ruiz, MD   Dg Abd Portable 1v  03/10/2013   *RADIOLOGY REPORT*  Clinical Data: NG tube placement  PORTABLE ABDOMEN - 1 VIEW  Comparison: CT abdomen pelvis 03/09/2013  Findings: Nasogastric tube terminates in the stomach, and satisfactory position.  Contrast material is seen within the proximal colon.  Diffuse mild gaseous distention of small bowel loops.  Moderate convex left scoliosis of the lumbar spine.  IMPRESSION: Satisfactory position of nasogastric tube.   Original Report Authenticated By: Britta Mccreedy, M.D.    Anti-infectives: Anti-infectives   Start     Dose/Rate Route  Frequency Ordered Stop   03/09/13 1945  cefTRIAXone (ROCEPHIN) 1 g in dextrose 5 % 50 mL IVPB     1 g 100 mL/hr over 30 Minutes Intravenous  Once 03/09/13 1938 03/09/13 2038       Assessment/Plan  1. PSBO, resolved  Plan: 1. Clear liquids and advance diet as tolerates.  The patient is resolving and no surgical intervention is warranted at this time.  We will sign off.  Please call as needed.   LOS: 2 days    Brennan Litzinger E 03/11/2013, 7:48 AM Pager: [phone]

## 2013-03-11 NOTE — Evaluation (Signed)
Physical Therapy Evaluation Patient Details Name: Erin Gibson MRN: [id number] DOB: Nov 13, 1918 Today's Date: 03/11/2013 Time: 6578-4696 PT Time Calculation (min): 31 min  PT Assessment / Plan / Recommendation Clinical Impression  79 yo female admitted with partial SBO. Pt was modified independent with use of cane for ambulation PTA. However, pt reports multiple fall hx. On eval, pt required Min assist for mobility-able to ambulate ~65 feet with RW. Pt demonstrates general weakness, decreased activity tolerance, and impaired balance. Recommend ST rehab at SNF for continued rehab. However, pt prefers to d/c home. If pt discharges home, may benefit from home health aide to assist with ADLs/IADLS.     PT Assessment  Patient needs continued PT services    Follow Up Recommendations  SNF;Supervision/Assistance - 24 hour.initially (HHPT and home health aide if pt refuses SNF-pt prefers to d/c home)    Does the patient have the potential to tolerate intense rehabilitation      Barriers to Discharge        Equipment Recommendations  Rolling walker with 5" wheels    Recommendations for Other Services OT consult   Frequency Min 3X/week    Precautions / Restrictions Precautions Precautions: Fall Restrictions Weight Bearing Restrictions: No   Pertinent Vitals/Pain Pt denies pain      Mobility  Bed Mobility Bed Mobility: Supine to Sit Supine to Sit: 4: Min assist;With rails;HOB elevated Details for Bed Mobility Assistance: assist for trunk to upright. Increased time. Pt SOB and required rest break after sitting up at EOB Transfers Transfers: Sit to Stand;Stand to Sit Sit to Stand: 4: Min assist;From bed Stand to Sit: 4: Min assist;To bed;To chair/3-in-1 Details for Transfer Assistance: x 2. VCS safety, technique, hand placement. Assist to rise, stabilize, control descent. LOB x 1 posteriorly when doffing gown while standing in front of chair.  Ambulation/Gait Ambulation/Gait  Assistance: 4: Min assist Ambulation Distance (Feet): 65 Feet Assistive device: Rolling walker Ambulation/Gait Assistance Details: VC safety. Slow gait speed. Fatigues fairly easily. Dyspnea 2/4.  Gait Pattern: Step-through pattern;Trunk flexed;Decreased stride length    Exercises     PT Diagnosis: Difficulty walking;Abnormality of gait;Generalized weakness  PT Problem List: Decreased strength;Decreased activity tolerance;Decreased balance;Decreased mobility;Decreased knowledge of use of DME PT Treatment Interventions: DME instruction;Gait training;Functional mobility training;Therapeutic activities;Therapeutic exercise;Balance training;Patient/family education   PT Goals Acute Rehab PT Goals PT Goal Formulation: With patient/family Time For Goal Achievement: 03/25/13 Potential to Achieve Goals: Good Pt will go Supine/Side to Sit: with modified independence PT Goal: Supine/Side to Sit - Progress: Goal set today Pt will go Sit to Supine/Side: with modified independence PT Goal: Sit to Supine/Side - Progress: Goal set today Pt will go Sit to Stand: with modified independence PT Goal: Sit to Stand - Progress: Goal set today Pt will Ambulate: 51 - 150 feet;with modified independence;with least restrictive assistive device PT Goal: Ambulate - Progress: Goal set today  Visit Information  Last PT Received On: 03/11/13 Assistance Needed: +1    Subjective Data  Subjective: I was hoping it wouldn't come to that (rehab) Patient Stated Goal: home   Prior Functioning  Home Living Lives With: Alone Type of Home: House Home Access: Level entry (side door) Home Layout: One level Bathroom Shower/Tub: Tub/shower unit Home Adaptive Equipment: Shower chair without back;Hand-held shower hose;Straight cane Prior Function Level of Independence: Independent with assistive device(s) Driving: Yes Communication Communication: No difficulties    Cognition  Cognition Arousal/Alertness:  Awake/alert Behavior During Therapy: WFL for tasks assessed/performed Overall Cognitive Status: Within  Functional Limits for tasks assessed    Extremity/Trunk Assessment Right Lower Extremity Assessment RLE ROM/Strength/Tone: Deficits RLE ROM/Strength/Tone Deficits: Strength 3+/5 throughout. Leg length discrepancy per pt (leg is shorter) Left Lower Extremity Assessment LLE ROM/Strength/Tone: Deficits LLE ROM/Strength/Tone Deficits: Strength at least 4/5 throughout Trunk Assessment Trunk Assessment: Kyphotic   Balance Balance Balance Assessed: Yes Static Standing Balance Static Standing - Balance Support: No upper extremity supported;During functional activity Static Standing - Level of Assistance: 4: Min assist Static Standing - Comment/# of Minutes: LOb x 1 posteriorly while taking off gown.  Dynamic Standing Balance Dynamic Standing - Balance Support: During functional activity;Bilateral upper extremity supported Dynamic Standing - Level of Assistance: 4: Min assist  End of Session PT - End of Session Equipment Utilized During Treatment: Gait belt Activity Tolerance: Patient limited by fatigue Patient left: in chair;with call bell/phone within reach;with family/visitor present  GP     [name], MPT Pager: [phone]

## 2013-03-12 DIAGNOSIS — K219 Gastro-esophageal reflux disease without esophagitis: Secondary | ICD-10-CM | POA: Diagnosis not present

## 2013-03-12 DIAGNOSIS — M1990 Unspecified osteoarthritis, unspecified site: Secondary | ICD-10-CM | POA: Diagnosis not present

## 2013-03-12 DIAGNOSIS — D649 Anemia, unspecified: Secondary | ICD-10-CM | POA: Diagnosis not present

## 2013-03-12 DIAGNOSIS — R112 Nausea with vomiting, unspecified: Secondary | ICD-10-CM

## 2013-03-12 DIAGNOSIS — K4090 Unilateral inguinal hernia, without obstruction or gangrene, not specified as recurrent: Secondary | ICD-10-CM | POA: Diagnosis not present

## 2013-03-12 DIAGNOSIS — I10 Essential (primary) hypertension: Secondary | ICD-10-CM | POA: Diagnosis not present

## 2013-03-12 DIAGNOSIS — I83009 Varicose veins of unspecified lower extremity with ulcer of unspecified site: Secondary | ICD-10-CM | POA: Diagnosis not present

## 2013-03-12 DIAGNOSIS — I872 Venous insufficiency (chronic) (peripheral): Secondary | ICD-10-CM | POA: Diagnosis not present

## 2013-03-12 DIAGNOSIS — N390 Urinary tract infection, site not specified: Secondary | ICD-10-CM | POA: Diagnosis not present

## 2013-03-12 DIAGNOSIS — R6889 Other general symptoms and signs: Secondary | ICD-10-CM | POA: Diagnosis not present

## 2013-03-12 DIAGNOSIS — F329 Major depressive disorder, single episode, unspecified: Secondary | ICD-10-CM | POA: Diagnosis not present

## 2013-03-12 DIAGNOSIS — E46 Unspecified protein-calorie malnutrition: Secondary | ICD-10-CM | POA: Diagnosis not present

## 2013-03-12 DIAGNOSIS — Z5189 Encounter for other specified aftercare: Secondary | ICD-10-CM | POA: Diagnosis not present

## 2013-03-12 DIAGNOSIS — N184 Chronic kidney disease, stage 4 (severe): Secondary | ICD-10-CM | POA: Diagnosis not present

## 2013-03-12 DIAGNOSIS — K56609 Unspecified intestinal obstruction, unspecified as to partial versus complete obstruction: Secondary | ICD-10-CM | POA: Diagnosis not present

## 2013-03-12 DIAGNOSIS — R109 Unspecified abdominal pain: Secondary | ICD-10-CM | POA: Diagnosis not present

## 2013-03-12 LAB — BASIC METABOLIC PANEL
BUN: 18 mg/dL (ref 6–23)
Creatinine, Ser: 1.04 mg/dL (ref 0.50–1.10)
GFR calc Af Amer: 52 mL/min — ABNORMAL LOW (ref >90)
GFR calc non Af Amer: 45 mL/min — ABNORMAL LOW (ref >90)
Potassium: 3.6 mEq/L (ref 3.5–5.1)

## 2013-03-12 MED ORDER — PANTOPRAZOLE SODIUM 40 MG PO TBEC
40.0000 mg | DELAYED_RELEASE_TABLET | Freq: Every day | ORAL | Status: DC
  Administered 2013-03-12: 40 mg via ORAL
  Filled 2013-03-12: qty 1

## 2013-03-12 MED ORDER — SULFAMETHOXAZOLE-TMP DS 800-160 MG PO TABS
1.0000 | ORAL_TABLET | Freq: Two times a day (BID) | ORAL | Status: DC
  Filled 2013-03-12 (×2): qty 1

## 2013-03-12 MED ORDER — CEPHALEXIN 500 MG PO CAPS: 500.0000 mg | ORAL_CAPSULE | Freq: Two times a day (BID) | ORAL | Status: DC

## 2013-03-12 MED ORDER — SENNOSIDES-DOCUSATE SODIUM 8.6-50 MG PO TABS: 1.0000 | ORAL_TABLET | Freq: Two times a day (BID) | ORAL | Status: DC | PRN

## 2013-03-12 MED ORDER — SULFAMETHOXAZOLE-TMP DS 800-160 MG PO TABS: 1.0000 | ORAL_TABLET | Freq: Two times a day (BID) | ORAL | Status: DC

## 2013-03-12 MED ORDER — CEPHALEXIN 500 MG PO CAPS
500.0000 mg | ORAL_CAPSULE | Freq: Two times a day (BID) | ORAL | Status: DC
  Administered 2013-03-12: 500 mg via ORAL
  Filled 2013-03-12 (×2): qty 1

## 2013-03-12 MED ORDER — BISACODYL 10 MG RE SUPP: 10.0000 mg | RECTAL | Status: DC | PRN

## 2013-03-12 MED ORDER — LISINOPRIL 40 MG PO TABS
40.0000 mg | ORAL_TABLET | Freq: Every day | ORAL | Status: DC
  Administered 2013-03-12: 40 mg via ORAL
  Filled 2013-03-12: qty 1

## 2013-03-12 NOTE — Progress Notes (Signed)
This patient is receiving IV Protonix. Based on criteria approved by the Pharmacy and Therapeutics Committee, this medication is being converted to the equivalent oral dose form. These criteria include:   . The patient is eating (either orally or per tube) and/or has been taking other orally administered medications for at least 24 hours.  . This patient has no evidence of active gastrointestinal bleeding or impaired GI absorption (gastrectomy, short bowel, patient on TNA or NPO).   If you have questions about this conversion, please contact the pharmacy department.  Clydene Fake, [facility] 03/12/2013 8:03 AM

## 2013-03-12 NOTE — Progress Notes (Signed)
Physical Therapy Treatment Patient Details Name: Erin Gibson MRN: [id number] DOB: [birth year] Today's Date: 03/12/2013 Time: 1130-1201 PT Time Calculation (min): 31 min  PT Assessment / Plan / Recommendation Comments on Treatment Session  Pt planning to d/c to snf later today. Continues to require min-mod assist for mobility.     Follow Up Recommendations  SNF;Supervision/Assistance - 24 hour     Does the patient have the potential to tolerate intense rehabilitation     Barriers to Discharge        Equipment Recommendations  Rolling walker with 5" wheels    Recommendations for Other Services    Frequency Min 3X/week   Plan Discharge plan remains appropriate    Precautions / Restrictions Precautions Precautions: Fall Restrictions Weight Bearing Restrictions: No   Pertinent Vitals/Pain Chronic back pain    Mobility  Bed Mobility Bed Mobility: Not assessed Details for Bed Mobility Assistance: pt sitting in recliner Transfers Transfers: Sit to Stand;Stand to Sit Sit to Stand: 4: Min assist;3: Mod assist;From chair/3-in-1;From toilet Stand to Sit: 4: Min assist;To chair/3-in-1;To toilet Details for Transfer Assistance: x 2 for functional mobility, x 3 for strengthening. VCS safety, technique, hand placement. Assist to rise, stabilize, control descent. Pt needed increased assist from toilet.  Ambulation/Gait Ambulation/Gait Assistance: 4: Min assist Ambulation Distance (Feet): 55 Feet Assistive device: Rolling walker Ambulation/Gait Assistance Details: slow gait speed. Fatigues fairly easily. dyspnea 2/4 Gait Pattern: Step-through pattern;Trunk flexed;Decreased stride length    Exercises General Exercises - Lower Extremity Hip Flexion/Marching: AROM;Both;5 reps;Standing Heel Raises: AROM;Both;5 reps;Standing   PT Diagnosis:    PT Problem List:   PT Treatment Interventions:     PT Goals Acute Rehab PT Goals Pt will go Sit to Stand: with modified independence PT  Goal: Sit to Stand - Progress: Progressing toward goal Pt will Ambulate: 51 - 150 feet;with modified independence;with least restrictive assistive device PT Goal: Ambulate - Progress: Progressing toward goal  Visit Information  Last PT Received On: 03/12/13 Assistance Needed: +1    Subjective Data  Subjective: Im supposed to leave today Patient Stated Goal: home but agreeable to rehab   Cognition  Cognition Arousal/Alertness: Awake/alert Behavior During Therapy: WFL for tasks assessed/performed Overall Cognitive Status: Within Functional Limits for tasks assessed    Balance     End of Session PT - End of Session Equipment Utilized During Treatment: Gait belt Activity Tolerance: Patient limited by fatigue Patient left: in chair;with call bell/phone within reach   GP     [name], MPT Pager: [phone]

## 2013-03-12 NOTE — Progress Notes (Addendum)
Pt for discharge to [facility].  CSW facilitated pt discharge needs including contacting facility, faxing pt d/c information via [facility], discussing with pt at bedside, providing RN phone number to call report, and arranging ambulance transportation for pt to [facility] (Service Request ID #: [id number]).  No further social work needs identified at this time.  CSW signing off.    Jacklynn Lewis, MSW, LCSWA  Clinical Social Work [phone]

## 2013-03-12 NOTE — Progress Notes (Signed)
CSW received notification that pt medically stable for d/c today.  CSW met with pt at bedside to provide bed offers.   Pt chooses bed at [facility].  CSW left message with admission coordinator and awaiting return phone call re: if facility able to accept pt today.  CSW to facilitate pt discharge needs this afternoon.  Jacklynn Lewis, MSW, LCSWA  Clinical Social Work [phone]

## 2013-03-12 NOTE — Discharge Summary (Signed)
Physician Discharge Summary  CASSIDI MODESITT ZOX:[id number] DOB: 02-14-1919 [name]: 03/09/2013  PCP: Rogelia Boga, MD  Admit date: 03/09/2013 Discharge date: 03/12/2013  Time spent: 50 minutes  Recommendations for Outpatient Follow-up:  Follow up PCP in 2 weeks  Discharge Diagnoses:  Principal Problem:   Partial small bowel obstruction Active Problems:   ANEMIA   HYPERTENSION   VENOUS STASIS ULCER   GERD   DEGENERATIVE JOINT DISEASE, GENERALIZED   Inguinal hernia, left - Contains sigmoid colon.  Reducible   Discharge Condition: Stable  Diet recommendation: Low salt diet  Filed Weights   03/09/13 1553  Weight: 50.803 kg (112 lb)    History of present illness:  79 year old female with past medical history most significant for degenerative joint disease, reflux disease, hypertension, chronic venous stasis, chronic kidney disease stage III - IV, and history of partial small bowel obstruction in the past (2.5 years ago) presented to ED with chief complaint of nausea, vomiting, abdominal pain and dizziness. Patient has chronic constipation and has been using MiraLax twice a day. Patient developed nausea and vomiting and threw up multiple times since breakfast in the morning. Vomitus contained food particles. Vomiting and nausea was associated with abdominal pain, generalized, aching in character, present in the upper middle to left abdomen, nonradiating, no exacerbating or relieving factors. Patient had been unable to move her bowels since morning. She also felt dizzy and almost passed out twice. She denies any fever, chills, chest pain, shortness of breath, change in diet, change in medications. Patient has been having progressive constipation over last 1 week. She lives alone and her daughter lives in [place], [place] Course:  Partial small bowel obstruction, ileus- resolved, started on clear liquid diet, diet was advanced and she is tolerating the diet well  Surgery  has now signed off. NG is now out.   Anemia of chronic disease- stable. Hemoglobin is at baseline.  Hypertension- BP is stable.  Continue Accupril CKD stage III- Patient's creatinine is at baseline.  UTI- urine culture growing klebsiella, will start keflex for 10 days.   Procedures:  None  Consultations:  surgery  Discharge Exam: Filed Vitals:   03/11/13 0500 03/11/13 1421 03/11/13 2132 03/12/13 0648  BP: 144/57 157/59 152/61 166/67  Pulse: 91 81 57 57  Temp: 98 F (36.7 C) 99 F (37.2 C) 98.6 F (37 C) 97.7 F (36.5 C)  TempSrc: Oral Oral Oral Oral  Resp: 18 16 16 16   Height:      Weight:      SpO2: 96% 98% 97% 97%    General: Appear in no acute distress Cardiovascular: S1s2 RRR Respiratory: clear bilaterally Ext: No edema  Discharge Instructions  Discharge Orders   Future Appointments Provider Department Dept Phone   07/24/2013 9:30 AM Gordy Savers, MD [facility] [phone]   Future Orders Complete By Expires     Diet - low sodium heart healthy  As directed     Increase activity slowly  As directed         Medication List    TAKE these medications       Artificial Tear Gel  Place 2 drops into both eyes 2 (two) times daily.     aspirin EC 81 MG tablet  Take 81 mg by mouth every morning.     bisacodyl 10 MG suppository  Commonly known as:  DULCOLAX  Place 1 suppository (10 mg total) rectally as needed for constipation.  CALCIUM 600 + D PO  Take 1 tablet by mouth every morning.     diclofenac 50 MG EC tablet  Commonly known as:  VOLTAREN  Take 50 mg by mouth 2 (two) times daily.     furosemide 40 MG tablet  Commonly known as:  LASIX  Take 1 tablet (40 mg total) by mouth daily.     meclizine 25 MG tablet  Commonly known as:  ANTIVERT  Take 25 mg by mouth 3 (three) times daily as needed for dizziness.     nitroGLYCERIN 0.4 MG SL tablet  Commonly known as:  NITROSTAT  Place 0.4 mg under the tongue every 5  (five) minutes as needed for chest pain.     omeprazole 40 MG capsule  Commonly known as:  PRILOSEC  Take 1 capsule (40 mg total) by mouth daily.     polyethylene glycol powder powder  Commonly known as:  GLYCOLAX/MIRALAX  Take 17 g by mouth 2 (two) times daily.     potassium chloride SA 20 MEQ tablet  Commonly known as:  K-DUR,KLOR-CON  Take 1 tablet (20 mEq total) by mouth daily.     quinapril 40 MG tablet  Commonly known as:  ACCUPRIL  Take 1 tablet (40 mg total) by mouth daily.     senna-docusate 8.6-50 MG per tablet  Commonly known as:  Senokot-S  Take 1 tablet by mouth 2 (two) times daily as needed for constipation.     sulfamethoxazole-trimethoprim 800-160 MG per tablet  Commonly known as:  BACTRIM DS  Take 1 tablet by mouth every 12 (twelve) hours.     Vitamin B-12 2500 MCG Subl  Place 1 tablet under the tongue every morning.     vitamin E 400 UNIT capsule  Generic drug:  vitamin E  Take 400 Units by mouth every morning.       Allergies  Allergen Reactions  . Codeine Nausea And Vomiting    Violently ill  . Diltiazem Hcl Other (See Comments)    Not sure about this reaction  . Ibuprofen Nausea And Vomiting      The results of significant diagnostics from this hospitalization (including imaging, microbiology, ancillary and laboratory) are listed below for reference.    Significant Diagnostic Studies: Ct Abdomen Pelvis Wo Contrast  03/09/2013   *RADIOLOGY REPORT*  Clinical Data: Constipation, nausea and vomiting  CT ABDOMEN AND PELVIS WITHOUT CONTRAST  Technique:  Multidetector CT imaging of the abdomen and pelvis was performed following the standard protocol without intravenous contrast.  Comparison: CT abdomen pelvis - 04/20/2010; abdominal radiograph - 03/09/2013  Findings:  The lack of intravenous contrast limits the ability to evaluate solid abdominal organs. Examination is further degraded secondary to the lack of significant mesenteric fat.  Normal hepatic  contour.  There is a trace amount of fluid adjacent to the right lobe of the liver.  Normal appearance of the medially located gallbladder.  The approximately 5.8 x 4.3 cm exophytic lesion with peripheral mural nodular calcification has minimally increased in size in the interval, previously, 5.5 x 4.1 cm.  There is a triangular-shaped increased opacification involving the mid/superior aspect of the left kidney (image 20, series 2) which may represent a small geographic area of medullary nephrocalcinosis.  No definite renal stones.  No urinary obstruction or perinephric stranding.  The bilateral adrenal glands are suboptimally evaluated on this noncontrast examination.  The pancreas appears atrophic.  Normal noncontrast appearance of the spleen.  Ingested enteric contrast extends to the  level of the distal small bowel.  The proximal small bowel is mildly dilated with possible transition point within the left hemi pelvis (axial image 59, series 2).  This is associated relative decompression of the downstream small bowel.  Air is seen within the hepatic flexure of the colon though there is relative decompression of the colon. There is a small amount of intra abdominal fluid with fluid seen within the pelvic cul-de-sac.  No pneumoperitoneum, pneumatosis or portal venous gas.   A left-sided presumed indirect inguinal hernia contains a portion of the descending colon but does not result in obstruction.  There is a small amount of fluid within the caudal most aspect of the hernia sac.  There is grossly unchanged mild aneurysmal dilatation of the infrarenal abdominal aorta measuring approximately 2.9 cm in greatest axial dimension (image 30, series 2).  Evaluation for intra-abdominal adenopathy is limited on this noncontrast examination.  Limited visualization of the lower thorax demonstrates persistent mild elevation/eventration of the left hemidiaphragm. There is unchanged partial atelectasis of the left lower lobe.   There is an unchanged approximate 4 mm nodule within the right costophrenic angle (image 6, series 6), unchanged since the 03/2010 examination and thus of benign etiology.  Borderline cardiomegaly.  Calcifications within the aortic valve annulus.  No pericardial effusion.  No acute or aggressive osseous abnormalities.  Severe scoliotic curvature of the thoracolumbar spine with multilevel degenerative change.  Vertebral body heights appear grossly preserved given obliquity.  IMPRESSION:  1.  Mild to moderate upstream dilatation of the small bowel with possible transition point noted within the left hemi pelvis, worrisome for least a partial small bowel obstruction.  The etiology of this apparent obstruction is not depicted on this examination and thus is presumably secondary to adhesions. 2.  Small left sided presumably indirect inguinal hernia contains a small portion of the descending colon but does not definitely result in obstruction at this location. 3.  Small amount of intra-abdominal fluid without drainable fluid collection. 4.  Grossly unchanged ectasia of the infrarenal abdominal aorta measuring approximately 2.9 cm in diameter. 5.  The approximately 5.8 cm exophytic peripherally calcified right- sided renal lesion correlates with the findings on recent abdominal radiograph.  This lesion has minimally increased in size since remote abdominal CT performed 03/2010, previously, 5.5 cm and while is incompletely evaluated on this examination, is at least a Bosniak category IIF lesion.   Original Report Authenticated By: Tacey Ruiz, MD   Dg Chest Portable 1 View  03/09/2013   *RADIOLOGY REPORT*  Clinical Data: Constipation, nausea and emesis.  Evaluate nasogastric tube placement.  PORTABLE CHEST - 1 VIEW  Comparison: Acute abdominal series 03/09/2013.  Findings: Tip of nasogastric tube is near the gastroesophageal junction, with side port in the distal third of the esophagus. Lung volumes are low.  Linear  opacities in the left base favored to predominately reflect subsegmental atelectasis, although the sequelae of recent aspiration is not excluded.  No definite pleural effusions.  Mild diffuse interstitial prominence and peribronchial cuffing.  Mild cephalization of the pulmonary vasculature. Thickening of the minor fissure.  Heart size appears mildly enlarged. The patient is rotated to the left on today's exam, resulting in distortion of the mediastinal contours and reduced diagnostic sensitivity and specificity for mediastinal pathology. Atherosclerosis in the thoracic aorta.  IMPRESSION: 1.  Tip of nasogastric tube is at the gastroesophageal junction and could be advanced approximately 8-10 cm for more optimal placement. 2.  Worsening opacity in the left  lower lobe which may reflect increasing atelectasis, however, sequelae of recent aspiration is not excluded.  Clinical correlation is recommended. 3.  Findings suggestive of developing mild interstitial pulmonary edema, as above. 4.  Atherosclerosis.   Original Report Authenticated By: Trudie Reed, M.D.   Dg Abd Acute W/chest  03/09/2013   *RADIOLOGY REPORT*  Clinical Data: Abdominal pain for 2 weeks, nausea and vomiting, constipation  ACUTE ABDOMEN SERIES (ABDOMEN 2 VIEW & CHEST 1 VIEW)  Comparison: 04/23/2010; 04/21/2010;  Findings:  Enlarged cardiac silhouette and mediastinal contours.  The lungs appear mildly hyperexpanded with flattening of bilateral hemidiaphragms.  Minimal left basilar linear heterogeneous opacities are grossly unchanged.  No pleural effusion definite pneumothorax.  No definite evidence of edema.  There is moderate gaseous distension of multiple loops of colon. No pneumoperitoneum, pneumatosis or portal venous gas.  Laminar calcifications are seen within the right upper abdominal quadrant and favored to represent calcifications within the gallbladder wall.  Atherosclerotic plaque within the abdominal aorta.  There is possible mild  ectasia of the abdominal aorta measuring approximately 3.2 cm in diameter.  Severe scoliotic curvature of the thoracolumbar spine. There is presumed chronic deformity of the lateral aspect of the right third rib. A bone island is seen within the left proximal humeral metaphysis.  IMPRESSION:  1.  Mild gaseous distension of the colon suggestive of ileus.  No definite evidence of obstruction. 2.  The gallbladder wall appears calcified, similar to remote prior examinations.  Further evaluation with abdominal CT may be performed as clinically indicated.  3.  Cardiomegaly.  Further evaluation cardaiac echo may form as clinically indicated. 4.  Suspected mild aneurysmal dilatation of the abdominal aorta. Further evaluation with a non emergent aortic ultrasound may be performed as clinically indicated.  5.  Chronic left basilar/retrocardiac opacities, favored to represent atelectasis or scar. Further evaluation with a PA and lateral chest radiograph may be obtained as clinically indicated.   Original Report Authenticated By: Tacey Ruiz, MD   Dg Abd Portable 1v  03/10/2013   *RADIOLOGY REPORT*  Clinical Data: NG tube placement  PORTABLE ABDOMEN - 1 VIEW  Comparison: CT abdomen pelvis 03/09/2013  Findings: Nasogastric tube terminates in the stomach, and satisfactory position.  Contrast material is seen within the proximal colon.  Diffuse mild gaseous distention of small bowel loops.  Moderate convex left scoliosis of the lumbar spine.  IMPRESSION: Satisfactory position of nasogastric tube.   Original Report Authenticated By: Britta Mccreedy, M.D.    Microbiology: Recent Results (from the past 240 hour(s))  URINE CULTURE     Status: None   Collection Time    03/09/13  6:20 PM      Result Value Range Status   Specimen Description URINE, CLEAN CATCH   Final   Special Requests NONE   Final   Culture  Setup Time 03/09/2013 22:00   Final   Colony Count >=100,000 COLONIES/ML   Final   Culture KLEBSIELLA PNEUMONIAE    Final   Report Status 03/11/2013 FINAL   Final   Organism ID, Bacteria KLEBSIELLA PNEUMONIAE   Final     Labs: Basic Metabolic Panel:  Recent Labs Lab 03/09/13 1650 03/10/13 0356 03/11/13 0347 03/12/13 0419  NA 140 139 142 140  K 4.4 3.5 3.4* 3.6  CL 104 108 107 109  CO2 27 25 17* 24  GLUCOSE 117* 89 46* 89  BUN 24* 20 20 18   CREATININE 1.16* 1.07 1.09 1.04  CALCIUM 10.0 8.7 9.3 9.2  Liver Function Tests:  Recent Labs Lab 03/09/13 1650  AST 19  ALT 10  ALKPHOS 112  BILITOT 0.7  PROT 6.5  ALBUMIN 3.4*    Recent Labs Lab 03/09/13 1650  LIPASE 43   No results found for this basename: AMMONIA,  in the last 168 hours CBC:  Recent Labs Lab 03/09/13 1650 03/11/13 0347  WBC 8.6 4.8  NEUTROABS 7.6  --   HGB 11.8* 10.0*  HCT 36.2 31.5*  MCV 84.0 85.4  PLT 222 169   Cardiac Enzymes: No results found for this basename: CKTOTAL, CKMB, CKMBINDEX, TROPONINI,  in the last 168 hours BNP: BNP (last 3 results) No results found for this basename: PROBNP,  in the last 8760 hours CBG: No results found for this basename: GLUCAP,  in the last 168 hours     Signed:  Carolann Brazell S  [facility] 03/12/2013, 10:09 AM

## 2013-03-12 NOTE — Progress Notes (Signed)
Clinical Social Work Department CLINICAL SOCIAL WORK PLACEMENT NOTE 03/12/2013  Patient:  Erin Gibson, Erin Gibson  Account Number:  [id number] Admit date:  03/09/2013  Clinical Social Worker:  Jacelyn Grip  Date/time:  03/11/2013 04:30 PM  Clinical Social Work is seeking post-discharge placement for this patient at the following level of care:      (*CSW will update this form in Epic as items are completed)   03/11/2013  Patient/family provided with [facility] Department of Clinical Social Work's list of facilities offering this level of care within the geographic area requested by the patient (or if unable, by the patient's family).  03/11/2013  Patient/family informed of their freedom to choose among providers that offer the needed level of care, that participate in Medicare, Medicaid or managed care program needed by the patient, have an available bed and are willing to accept the patient.    Patient/family informed of [facility]' ownership interest in [facility], as well as of the fact that they are under no obligation to receive care at this facility.  PASARR submitted to EDS on 03/11/2013 PASARR number received from [employer] on 03/11/2013- existing  FL2 transmitted to all facilities in geographic area requested by pt/family on  03/12/2013 FL2 transmitted to all facilities within larger geographic area on   Patient informed that his/her managed care company has contracts with or will negotiate with  certain facilities, including the following:     Patient/family informed of bed offers received:  03/12/2013 Patient chooses bed at [facility], [place] Physician recommends and patient chooses bed at    [facility] [place] on  03/12/2013 Patient to be transferred to facility by ambulance Sharin Mons)  The following physician request were entered in Epic:   Additional Comments:    Jacklynn Lewis, MSW, LCSWA   Clinical Social Work [phone]

## 2013-03-12 NOTE — Progress Notes (Signed)
Patient discharged clapps, copies of all discharge medications and instructions to be sent to facility. Patient to be transported via ambulance.

## 2013-03-13 NOTE — ED Provider Notes (Signed)
Medical screening examination/treatment/procedure(s) were conducted as a shared visit with non-physician practitioner(s) and myself.  I personally evaluated the patient during the encounter  Pt seen and evaluated- she has diffuse abdominal tenderness, feels improved after meds.  Pt admitted to medicine, surgery consult obtained.   Ethelda Chick, MD 03/13/13 1504

## 2013-03-17 DIAGNOSIS — E46 Unspecified protein-calorie malnutrition: Secondary | ICD-10-CM | POA: Diagnosis not present

## 2013-03-17 DIAGNOSIS — K56609 Unspecified intestinal obstruction, unspecified as to partial versus complete obstruction: Secondary | ICD-10-CM | POA: Diagnosis not present

## 2013-03-17 DIAGNOSIS — F329 Major depressive disorder, single episode, unspecified: Secondary | ICD-10-CM | POA: Diagnosis not present

## 2013-03-17 DIAGNOSIS — D649 Anemia, unspecified: Secondary | ICD-10-CM | POA: Diagnosis not present

## 2013-03-24 DIAGNOSIS — K219 Gastro-esophageal reflux disease without esophagitis: Secondary | ICD-10-CM | POA: Diagnosis not present

## 2013-03-24 DIAGNOSIS — I10 Essential (primary) hypertension: Secondary | ICD-10-CM | POA: Diagnosis not present

## 2013-03-24 DIAGNOSIS — K56609 Unspecified intestinal obstruction, unspecified as to partial versus complete obstruction: Secondary | ICD-10-CM | POA: Diagnosis not present

## 2013-03-24 DIAGNOSIS — K4090 Unilateral inguinal hernia, without obstruction or gangrene, not specified as recurrent: Secondary | ICD-10-CM | POA: Diagnosis not present

## 2013-03-24 DIAGNOSIS — N184 Chronic kidney disease, stage 4 (severe): Secondary | ICD-10-CM | POA: Diagnosis not present

## 2013-03-24 DIAGNOSIS — D649 Anemia, unspecified: Secondary | ICD-10-CM | POA: Diagnosis not present

## 2013-03-24 DIAGNOSIS — I83009 Varicose veins of unspecified lower extremity with ulcer of unspecified site: Secondary | ICD-10-CM | POA: Diagnosis not present

## 2013-03-24 DIAGNOSIS — I872 Venous insufficiency (chronic) (peripheral): Secondary | ICD-10-CM | POA: Diagnosis not present

## 2013-03-24 DIAGNOSIS — Z5189 Encounter for other specified aftercare: Secondary | ICD-10-CM | POA: Diagnosis not present

## 2013-03-24 DIAGNOSIS — M1990 Unspecified osteoarthritis, unspecified site: Secondary | ICD-10-CM | POA: Diagnosis not present

## 2013-04-02 ENCOUNTER — Telehealth: Payer: Self-pay | Admitting: Internal Medicine

## 2013-04-02 DIAGNOSIS — M4120 Other idiopathic scoliosis, site unspecified: Secondary | ICD-10-CM | POA: Diagnosis not present

## 2013-04-02 DIAGNOSIS — M6281 Muscle weakness (generalized): Secondary | ICD-10-CM | POA: Diagnosis not present

## 2013-04-02 NOTE — Telephone Encounter (Signed)
Debroah Loop, PT at [facility]. Requesting order for phys therapy.

## 2013-04-03 ENCOUNTER — Telehealth: Payer: Self-pay | Admitting: Internal Medicine

## 2013-04-03 NOTE — Telephone Encounter (Signed)
Called Erin Gibson left detailed message on personal voicemail verbal order for PT Evaluate and Treat.

## 2013-04-03 NOTE — Telephone Encounter (Signed)
Called and spoke to [place] asked her if she needed anything? She said no this is just an FYI on pt. Told her okay.

## 2013-04-03 NOTE — Telephone Encounter (Signed)
Left message to call office

## 2013-04-03 NOTE — Telephone Encounter (Signed)
Harriett Sine RN from [facility] is calling to inform MD they have obtain this patient  for home health. Harriett Sine added a Child psychotherapist to discuss getting a Engineer, mining

## 2013-04-03 NOTE — Telephone Encounter (Signed)
OT called to state that he completed his evaluation and he is planning on seeing her 1x a week for 1 week, and 2x a week for 4 weeks. They will practice functional mobility, energy conservation, and a home exercise program. He will discharge her once they reach their goals, or she reaches her maximum potential. FYI.

## 2013-04-06 DIAGNOSIS — M6281 Muscle weakness (generalized): Secondary | ICD-10-CM | POA: Diagnosis not present

## 2013-04-06 DIAGNOSIS — M4120 Other idiopathic scoliosis, site unspecified: Secondary | ICD-10-CM | POA: Diagnosis not present

## 2013-04-07 DIAGNOSIS — M4120 Other idiopathic scoliosis, site unspecified: Secondary | ICD-10-CM | POA: Diagnosis not present

## 2013-04-07 DIAGNOSIS — M6281 Muscle weakness (generalized): Secondary | ICD-10-CM | POA: Diagnosis not present

## 2013-04-09 DIAGNOSIS — M6281 Muscle weakness (generalized): Secondary | ICD-10-CM | POA: Diagnosis not present

## 2013-04-09 DIAGNOSIS — M4120 Other idiopathic scoliosis, site unspecified: Secondary | ICD-10-CM | POA: Diagnosis not present

## 2013-04-10 DIAGNOSIS — M6281 Muscle weakness (generalized): Secondary | ICD-10-CM | POA: Diagnosis not present

## 2013-04-10 DIAGNOSIS — M4120 Other idiopathic scoliosis, site unspecified: Secondary | ICD-10-CM | POA: Diagnosis not present

## 2013-04-13 ENCOUNTER — Telehealth: Payer: Self-pay | Admitting: Internal Medicine

## 2013-04-13 DIAGNOSIS — M4120 Other idiopathic scoliosis, site unspecified: Secondary | ICD-10-CM | POA: Diagnosis not present

## 2013-04-13 DIAGNOSIS — M6281 Muscle weakness (generalized): Secondary | ICD-10-CM | POA: Diagnosis not present

## 2013-04-13 NOTE — Telephone Encounter (Signed)
FYI - pt has been having hypotension AND hypoglycemia off & on. She has appt Thursday - wants a rx for a glucometer. Thinks these 2 things may be reasons why she's fallen in the past.

## 2013-04-13 NOTE — Telephone Encounter (Signed)
Pt will be here tomorrow at 10:15. I blocked another a.m. slot to accommodate.

## 2013-04-13 NOTE — Telephone Encounter (Signed)
Please call pt and see if we can add her on tomorrow instead of Thurs.

## 2013-04-14 ENCOUNTER — Telehealth: Payer: Self-pay | Admitting: *Deleted

## 2013-04-14 ENCOUNTER — Ambulatory Visit (INDEPENDENT_AMBULATORY_CARE_PROVIDER_SITE_OTHER): Payer: Medicare Other | Admitting: Internal Medicine

## 2013-04-14 ENCOUNTER — Encounter: Payer: Self-pay | Admitting: Internal Medicine

## 2013-04-14 VITALS — BP 122/60 | HR 81 | Temp 98.6°F | Resp 20 | Wt 108.0 lb

## 2013-04-14 DIAGNOSIS — K56609 Unspecified intestinal obstruction, unspecified as to partial versus complete obstruction: Secondary | ICD-10-CM | POA: Diagnosis not present

## 2013-04-14 DIAGNOSIS — L97909 Non-pressure chronic ulcer of unspecified part of unspecified lower leg with unspecified severity: Secondary | ICD-10-CM | POA: Diagnosis not present

## 2013-04-14 DIAGNOSIS — D649 Anemia, unspecified: Secondary | ICD-10-CM

## 2013-04-14 DIAGNOSIS — M159 Polyosteoarthritis, unspecified: Secondary | ICD-10-CM | POA: Diagnosis not present

## 2013-04-14 DIAGNOSIS — K4090 Unilateral inguinal hernia, without obstruction or gangrene, not specified as recurrent: Secondary | ICD-10-CM

## 2013-04-14 DIAGNOSIS — I83009 Varicose veins of unspecified lower extremity with ulcer of unspecified site: Secondary | ICD-10-CM

## 2013-04-14 DIAGNOSIS — I10 Essential (primary) hypertension: Secondary | ICD-10-CM

## 2013-04-14 DIAGNOSIS — L97929 Non-pressure chronic ulcer of unspecified part of left lower leg with unspecified severity: Secondary | ICD-10-CM

## 2013-04-14 DIAGNOSIS — K56600 Partial intestinal obstruction, unspecified as to cause: Secondary | ICD-10-CM

## 2013-04-14 LAB — CBC WITH DIFFERENTIAL/PLATELET
Basophils Absolute: 0 10*3/uL (ref 0.0–0.1)
Eosinophils Absolute: 0.2 10*3/uL (ref 0.0–0.7)
HCT: 36.1 % (ref 36.0–46.0)
Hemoglobin: 11.9 g/dL — ABNORMAL LOW (ref 12.0–15.0)
Lymphs Abs: 1.3 10*3/uL (ref 0.7–4.0)
MCHC: 33.1 g/dL (ref 30.0–36.0)
Monocytes Absolute: 0.5 10*3/uL (ref 0.1–1.0)
Monocytes Relative: 8.7 % (ref 3.0–12.0)
Neutro Abs: 3.7 10*3/uL (ref 1.4–7.7)
Platelets: 163 10*3/uL (ref 150.0–400.0)
RDW: 18.1 % — ABNORMAL HIGH (ref 11.5–14.6)

## 2013-04-14 LAB — BASIC METABOLIC PANEL
Calcium: 9.7 mg/dL (ref 8.4–10.5)
Creatinine, Ser: 1.3 mg/dL — ABNORMAL HIGH (ref 0.4–1.2)
GFR: 39.61 mL/min — ABNORMAL LOW (ref >60.00)
Sodium: 138 mEq/L (ref 135–145)

## 2013-04-14 MED ORDER — FUROSEMIDE 40 MG PO TABS: 20.0000 mg | ORAL_TABLET | Freq: Every day | ORAL | Status: DC

## 2013-04-14 NOTE — Patient Instructions (Addendum)
Limit your sodium (Salt) intake  Please check your blood pressure on a regular basis.  If it is consistently greater than 150/90, please make an office appointment.  Decrease furosemide to 20 mg daily; okay to take an extra 20 mg if swelling worsens

## 2013-04-14 NOTE — Progress Notes (Signed)
Subjective:    Patient ID: Erin Gibson, female    DOB: [birth year], 79 y.o.   MRN: [id number]  HPI  79 year old patient who has a history of treated hypertension. She was discharged from the hospital about one month ago after recurrent small bowel obstruction that was treated medically. She has done well since her hospital discharge after rehabilitation. She continues to have advanced home care work with her at home.  She has frequent episodes of dizziness. She and the nursing staff demonstrated this to hypoglycemia but she is on no hypoglycemic medications. She seems in fairly well. Medical regimen does include furosemide 40 mg daily. She does have intermittent dizziness and also a history of vertigo. She is on furosemide due 2 hypertension and venous insufficiency with stasis ulceration.  Hospital records reviewed  Past Medical History  Diagnosis Date  . ANEMIA 03/31/2009  . DEGENERATIVE JOINT DISEASE, GENERALIZED 05/06/2007  . GERD 04/24/2007  . HYPERTENSION 04/24/2007  . VENOUS STASIS ULCER 07/20/2008  . DVT (deep venous thrombosis)     hx of  . Chronic kidney disease   . Diverticulosis of sigmoid colon     Colonoscopy 2011    History   Social History  . Marital Status: Widowed    Spouse Name: N/A    Number of Children: N/A  . Years of Education: N/A   Occupational History  . Not on file.   Social History Main Topics  . Smoking status: Never Smoker   . Smokeless tobacco: Never Used  . Alcohol Use: No  . Drug Use: No  . Sexually Active: Not on file   Other Topics Concern  . Not on file   Social History Narrative  . No narrative on file    Past Surgical History  Procedure Laterality Date  . Cataract extraction    . Abdominal hysterectomy      Completion hysterectomy  . Total knee arthroplasty      x2  . Hernia repair    . Cholecystectomy open    . Partial hysterectomy      Salpingo-oophorectomy as well    No family history on file.  Allergies  Allergen  Reactions  . Codeine Nausea And Vomiting    Violently ill  . Diltiazem Hcl Other (See Comments)    Not sure about this reaction  . Ibuprofen Nausea And Vomiting    Current Outpatient Prescriptions on File Prior to Visit  Medication Sig Dispense Refill  . Artificial Tear GEL Place 2 drops into both eyes 2 (two) times daily.      Marland Kitchen aspirin EC 81 MG tablet Take 81 mg by mouth every morning.      . Calcium Carbonate-Vitamin D (CALCIUM 600 + D PO) Take 1 tablet by mouth every morning.      . Cyanocobalamin (VITAMIN B-12) 2500 MCG SUBL Place 1 tablet under the tongue every morning.      . diclofenac (VOLTAREN) 50 MG EC tablet Take 50 mg by mouth 2 (two) times daily.      . furosemide (LASIX) 40 MG tablet Take 1 tablet (40 mg total) by mouth daily.  90 tablet  6  . meclizine (ANTIVERT) 25 MG tablet Take 25 mg by mouth 3 (three) times daily as needed for dizziness.       . nitroGLYCERIN (NITROSTAT) 0.4 MG SL tablet Place 0.4 mg under the tongue every 5 (five) minutes as needed for chest pain.      Marland Kitchen omeprazole (PRILOSEC) 40 MG  capsule Take 1 capsule (40 mg total) by mouth daily.  90 capsule  6  . polyethylene glycol powder (GLYCOLAX/MIRALAX) powder Take 17 g by mouth 2 (two) times daily.  2550 g  4  . potassium chloride SA (K-DUR,KLOR-CON) 20 MEQ tablet Take 1 tablet (20 mEq total) by mouth daily.  90 tablet  6  . quinapril (ACCUPRIL) 40 MG tablet Take 1 tablet (40 mg total) by mouth daily.  90 tablet  3  . vitamin E (VITAMIN E) 400 UNIT capsule Take 400 Units by mouth every morning.        No current facility-administered medications on file prior to visit.    BP 122/60  Pulse 81  Temp(Src) 98.6 F (37 C) (Oral)  Resp 20  Wt 108 lb (48.988 kg)  BMI 21.09 kg/m2  SpO2 95%       Review of Systems  Constitutional: Positive for fatigue.  HENT: Negative for hearing loss, congestion, sore throat, rhinorrhea, dental problem, sinus pressure and tinnitus.   Eyes: Negative for pain,  discharge and visual disturbance.  Respiratory: Negative for cough and shortness of breath.   Cardiovascular: Positive for leg swelling. Negative for chest pain and palpitations.  Gastrointestinal: Negative for nausea, vomiting, abdominal pain, diarrhea, constipation, blood in stool and abdominal distention.  Genitourinary: Negative for dysuria, urgency, frequency, hematuria, flank pain, vaginal bleeding, vaginal discharge, difficulty urinating, vaginal pain and pelvic pain.  Musculoskeletal: Negative for joint swelling, arthralgias and gait problem.  Skin: Negative for rash.  Neurological: Positive for weakness and light-headedness. Negative for dizziness, syncope, speech difficulty, numbness and headaches.  Hematological: Negative for adenopathy.  Psychiatric/Behavioral: Negative for behavioral problems, dysphoric mood and agitation. The patient is not nervous/anxious.        Objective:   Physical Exam  Constitutional: She is oriented to person, place, and time. She appears well-developed and well-nourished.  Blood pressure as low as 122/60  HENT:  Head: Normocephalic.  Right Ear: External ear normal.  Left Ear: External ear normal.  Mouth/Throat: Oropharynx is clear and moist.  Eyes: Conjunctivae and EOM are normal. Pupils are equal, round, and reactive to light.  Neck: Normal range of motion. Neck supple. No thyromegaly present.  Cardiovascular: Normal rate, regular rhythm and intact distal pulses.   Murmur heard. Grade 2/6 systolic murmur  Pulmonary/Chest: Effort normal and breath sounds normal.  Abdominal: Soft. Bowel sounds are normal. She exhibits no distension and no mass. There is no tenderness. There is no rebound and no guarding.  Musculoskeletal: Normal range of motion.  Lymphadenopathy:    She has no cervical adenopathy.  Neurological: She is alert and oriented to person, place, and time.  Skin: Skin is warm and dry. No rash noted.  Psychiatric: She has a normal mood  and affect. Her behavior is normal.          Assessment & Plan:   Hypertension well controlled Recurrent dizziness. Possibly related to a component of orthostatic hypotension. We'll decrease furosemide to 20 mg every morning Venous stasis disease History recurrent small bowel obstruction. Clinically stable

## 2013-04-14 NOTE — Telephone Encounter (Signed)
Arnold from [facility] nurses called and said that pts bp was 174/80 and she feels hot and shaky, no chest pain.  She states that she get hypoglycemic but she does not have a meter to check her sugars.  She drank some juice and ate 3 candies and feeling better.  Per Dr Kirtland Bouchard she can see him this week.  Pt coming in today

## 2013-04-14 NOTE — Telephone Encounter (Signed)
Open in error

## 2013-04-15 DIAGNOSIS — M4120 Other idiopathic scoliosis, site unspecified: Secondary | ICD-10-CM | POA: Diagnosis not present

## 2013-04-15 DIAGNOSIS — M6281 Muscle weakness (generalized): Secondary | ICD-10-CM | POA: Diagnosis not present

## 2013-04-16 ENCOUNTER — Ambulatory Visit: Payer: Federal, State, Local not specified - PPO | Admitting: Internal Medicine

## 2013-04-16 DIAGNOSIS — M6281 Muscle weakness (generalized): Secondary | ICD-10-CM | POA: Diagnosis not present

## 2013-04-16 DIAGNOSIS — M4120 Other idiopathic scoliosis, site unspecified: Secondary | ICD-10-CM | POA: Diagnosis not present

## 2013-04-20 DIAGNOSIS — M4120 Other idiopathic scoliosis, site unspecified: Secondary | ICD-10-CM | POA: Diagnosis not present

## 2013-04-20 DIAGNOSIS — M6281 Muscle weakness (generalized): Secondary | ICD-10-CM | POA: Diagnosis not present

## 2013-04-21 DIAGNOSIS — M4120 Other idiopathic scoliosis, site unspecified: Secondary | ICD-10-CM | POA: Diagnosis not present

## 2013-04-21 DIAGNOSIS — M6281 Muscle weakness (generalized): Secondary | ICD-10-CM | POA: Diagnosis not present

## 2013-04-22 DIAGNOSIS — M4120 Other idiopathic scoliosis, site unspecified: Secondary | ICD-10-CM | POA: Diagnosis not present

## 2013-04-22 DIAGNOSIS — M6281 Muscle weakness (generalized): Secondary | ICD-10-CM | POA: Diagnosis not present

## 2013-04-24 ENCOUNTER — Telehealth: Payer: Self-pay | Admitting: Internal Medicine

## 2013-04-24 DIAGNOSIS — M4120 Other idiopathic scoliosis, site unspecified: Secondary | ICD-10-CM | POA: Diagnosis not present

## 2013-04-24 DIAGNOSIS — M6281 Muscle weakness (generalized): Secondary | ICD-10-CM | POA: Diagnosis not present

## 2013-04-24 NOTE — Telephone Encounter (Signed)
[facility] called and stated that the PT has a accu-check nano smart view glucose monitor system, that we gave her. PT needs strips called in, as well lancets. [facility] would [facility] [address] [facility] [address]. Please assist.

## 2013-04-24 NOTE — Telephone Encounter (Signed)
Left message for Erin Gibson  to call me on Monday.

## 2013-04-27 DIAGNOSIS — M4120 Other idiopathic scoliosis, site unspecified: Secondary | ICD-10-CM | POA: Diagnosis not present

## 2013-04-27 DIAGNOSIS — M6281 Muscle weakness (generalized): Secondary | ICD-10-CM | POA: Diagnosis not present

## 2013-04-27 NOTE — Telephone Encounter (Signed)
Spoke to Erin Gibson told her if I call in Rx for Test strips and lancets insurance will not cover due to pt is not diabetic and there is no documentation regarding checking sugars. Asked Erin Gibson how her sugars have been. Erin Gibson said they have been fine. Told her if they want can go buy a glucometer called Reli-On that is not too expensive and the test strips are only $9.00 a bottle I think for 50, you can [facility] it [facility] or [facility]. Erin Gibson verbalized understanding. Told Erin Gibson if pt sugars are okay I would just check them maybe two to three times a week and if she has an episode. Erin Gibson verbalized understanding.

## 2013-04-29 DIAGNOSIS — M6281 Muscle weakness (generalized): Secondary | ICD-10-CM | POA: Diagnosis not present

## 2013-04-29 DIAGNOSIS — M4120 Other idiopathic scoliosis, site unspecified: Secondary | ICD-10-CM | POA: Diagnosis not present

## 2013-07-24 ENCOUNTER — Ambulatory Visit (INDEPENDENT_AMBULATORY_CARE_PROVIDER_SITE_OTHER): Payer: Medicare Other | Admitting: Internal Medicine

## 2013-07-24 ENCOUNTER — Encounter: Payer: Self-pay | Admitting: Internal Medicine

## 2013-07-24 VITALS — BP 160/70 | HR 64 | Temp 98.3°F | Ht 59.5 in | Wt 100.0 lb

## 2013-07-24 DIAGNOSIS — Z0000 Encounter for general adult medical examination without abnormal findings: Secondary | ICD-10-CM | POA: Diagnosis not present

## 2013-07-24 DIAGNOSIS — M159 Polyosteoarthritis, unspecified: Secondary | ICD-10-CM | POA: Diagnosis not present

## 2013-07-24 DIAGNOSIS — I10 Essential (primary) hypertension: Secondary | ICD-10-CM | POA: Diagnosis not present

## 2013-07-24 DIAGNOSIS — D649 Anemia, unspecified: Secondary | ICD-10-CM

## 2013-07-24 LAB — COMPREHENSIVE METABOLIC PANEL
ALT: 12 U/L (ref 0–35)
AST: 20 U/L (ref 0–37)
Albumin: 3.9 g/dL (ref 3.5–5.2)
Alkaline Phosphatase: 94 U/L (ref 39–117)
BUN: 29 mg/dL — ABNORMAL HIGH (ref 6–23)
Calcium: 10.1 mg/dL (ref 8.4–10.5)
Chloride: 104 mEq/L (ref 96–112)
Potassium: 4.2 mEq/L (ref 3.5–5.1)
Sodium: 142 mEq/L (ref 135–145)
Total Protein: 7.2 g/dL (ref 6.0–8.3)

## 2013-07-24 LAB — CBC WITH DIFFERENTIAL/PLATELET
Basophils Absolute: 0 10*3/uL (ref 0.0–0.1)
Basophils Relative: 0.7 % (ref 0.0–3.0)
Eosinophils Absolute: 0.3 10*3/uL (ref 0.0–0.7)
Lymphocytes Relative: 28.7 % (ref 12.0–46.0)
MCHC: 33.3 g/dL (ref 30.0–36.0)
MCV: 90 fl (ref 78.0–100.0)
Monocytes Absolute: 0.4 10*3/uL (ref 0.1–1.0)
Neutrophils Relative %: 53.4 % (ref 43.0–77.0)
RBC: 4.22 Mil/uL (ref 3.87–5.11)
RDW: 14.5 % (ref 11.5–14.6)

## 2013-07-24 MED ORDER — TRAMADOL HCL 50 MG PO TABS: 50.0000 mg | ORAL_TABLET | Freq: Four times a day (QID) | ORAL | Status: DC | PRN

## 2013-07-24 MED ORDER — POTASSIUM CHLORIDE CRYS ER 20 MEQ PO TBCR: 20.0000 meq | EXTENDED_RELEASE_TABLET | Freq: Every day | ORAL | Status: DC

## 2013-07-24 MED ORDER — FUROSEMIDE 40 MG PO TABS: 20.0000 mg | ORAL_TABLET | Freq: Every day | ORAL | Status: DC

## 2013-07-24 MED ORDER — MECLIZINE HCL 25 MG PO TABS: 25.0000 mg | ORAL_TABLET | Freq: Three times a day (TID) | ORAL | Status: DC | PRN

## 2013-07-24 MED ORDER — FLUOXETINE HCL 10 MG PO CAPS: 10.0000 mg | ORAL_CAPSULE | Freq: Every day | ORAL | Status: DC

## 2013-07-24 MED ORDER — OMEPRAZOLE 40 MG PO CPDR: 40.0000 mg | DELAYED_RELEASE_CAPSULE | Freq: Every day | ORAL | Status: DC

## 2013-07-24 MED ORDER — POLYETHYLENE GLYCOL 3350 17 GM/SCOOP PO POWD: 17.0000 g | Freq: Two times a day (BID) | ORAL | Status: DC

## 2013-07-24 NOTE — Patient Instructions (Signed)
Limit your sodium (Salt) intake  Return in 6 months for follow-up  

## 2013-07-24 NOTE — Progress Notes (Signed)
Patient ID: [name], female   DOB: [birth year], 79 y.o.   MRN: [id number] Patient ID: [name], female   DOB: [birth year], 79 y.o.   MRN: [id number]  Subjective:    Patient ID: [name], female    DOB: 1919/05/17, 79 y.o.   MRN: [id number]  HPI  79 year-old patient who is seen today for a wellness exam. Medical problems include treated hypertension. She has done quite well on diuretic therapy as well as ACE inhibition. She has osteoarthritis as well as gastroesophageal reflux disease.  No major concerns or complaints. She still lives independently. She does have a daughter in [place].  Admit date: 03/09/2013  Discharge date: 03/12/2013    Discharge Diagnoses:  Principal Problem:   Partial small bowel obstruction  Active Problems:  ANEMIA  HYPERTENSION  VENOUS STASIS ULCER  GERD  DEGENERATIVE JOINT DISEASE, GENERALIZED  Inguinal hernia, left - Contains sigmoid colon. Reducible    Here for Medicare AWV:   1. Risk factors based on Past M, S, F history: personal risk factors include peripheral vascular disease, hypertension, and age  60. Physical Activities: fairly sedentary but remains independent  3. Depression/mood: no history of depression  4. Hearing:  mild hearing impairment  5. ADL's: remains independent in aspects of daily living  6. Fall Risk: Very high fall risk. uses a walker 7. Home Safety: no problems identified  8. Height, weight, &visual acuity: patient has lost 7 inches in height compared to her maximal height. Has had bilateral cataract extraction surgeries with lens implantation  9. Counseling: follow mammogram scheduled next month  10. Labs ordered based on risk factors: laboratory profile will be reviewed  11. Referral Coordination in-none appropriate except for follow-up mammogram  12. Care Plan- heart healthy diet. Discussed  13. Cognitive Assessment-alert and oriented, with normal affect   Allergies:  1) ! Codeine  2) ! Ibuprofen  3) ! Cardizem    Past History:  Past Medical History:   GERD  Hypertension  DJD  Peripheral Vascular Occlusive dz  history of DVT and pulmonary embolism, 1998  carpal tunnel syndrome  unsteady gait  chronic venous insufficiency  weight loss  anemia  chronic kidney disease   Past Surgical History:  Carpal tunnel release  Cataract extraction  Hysterectomy complete  Total knee replacement x 2  gravida 3, para two, abortus one  colonoscopy in February 2003  negative cryolite stress test November 2005  Inguinal herniorrhaphy   Family History:   Family History Other cancer-Colon, Breast, Prostate  Family History of Stroke M 1st degree relative <50  Fam hx MI  Fam hx CAD  4 brothers  3 sisters All deceased except for one brother  Social History:  Widowed  for 19 years a daughter lives in [place]. Continues to live independently.  Reviewed history from 12/15/2007 and no changes required.  Never Smoked  Retired   Review of Systems  Constitutional: Negative for fever, appetite change, fatigue and unexpected weight change.  HENT: Negative for congestion, dental problem, ear pain, hearing loss, mouth sores, nosebleeds, sinus pressure, sore throat, tinnitus, trouble swallowing and voice change.   Eyes: Negative for photophobia, pain, redness and visual disturbance.  Respiratory: Negative for cough, chest tightness and shortness of breath.   Cardiovascular: Negative for chest pain, palpitations and leg swelling.  Gastrointestinal: Negative for nausea, vomiting, abdominal pain, diarrhea, constipation, blood in stool, abdominal distention and rectal pain.  Genitourinary: Negative for dysuria, urgency, frequency, hematuria, flank pain, vaginal bleeding,  vaginal discharge, difficulty urinating, genital sores, vaginal pain, menstrual problem and pelvic pain.  Musculoskeletal: Positive for back pain and gait problem. Negative for arthralgias and neck stiffness.  Skin: Negative for rash.   Neurological: Negative for dizziness, syncope, speech difficulty, weakness, light-headedness, numbness and headaches.  Hematological: Negative for adenopathy. Does not bruise/bleed easily.  Psychiatric/Behavioral: Negative for suicidal ideas, behavioral problems, self-injury, dysphoric mood and agitation. The patient is not nervous/anxious.        Objective:   Physical Exam  Constitutional: She is oriented to person, place, and time. She appears well-developed and well-nourished.  HENT:  Head: Normocephalic and atraumatic.  Right Ear: External ear normal.  Left Ear: External ear normal.  Mouth/Throat: Oropharynx is clear and moist.  Eyes: Conjunctivae and EOM are normal.  Neck: Normal range of motion. Neck supple. No JVD present. No thyromegaly present.  Cardiovascular: Normal rate, regular rhythm and intact distal pulses.   Murmur heard. Grade 2/6 systolic murmur let us at the base with radiation to the right supraclavicular area  Pulmonary/Chest: Effort normal and breath sounds normal. She has no wheezes. She has no rales.  Kyphosis  Abdominal: Soft. Bowel sounds are normal. She exhibits no distension and no mass. There is no tenderness. There is no rebound and no guarding.  Musculoskeletal: Normal range of motion. She exhibits no edema and no tenderness.  Neurological: She is alert and oriented to person, place, and time. She has normal reflexes. No cranial nerve deficit. She exhibits normal muscle tone. Coordination normal.  Skin: Skin is warm and dry. No rash noted.  Psychiatric: She has a normal mood and affect. Her behavior is normal.          Assessment & Plan:  Preventive health examination Hypertension controlled Osteoarthritis. Reasonably stable;  permanent handicap placard completed

## 2013-07-30 ENCOUNTER — Telehealth: Payer: Self-pay | Admitting: Internal Medicine

## 2013-07-30 NOTE — Telephone Encounter (Signed)
Patient is calling in to make sure her Tramadol Rx was sent to [facility].  She reports that she was in the office recently but does not see her daily medication of Tramadol on her list as being refilled.  Reviewed EPIC . It is noted refill sent to [facility] on 07/24/13.  Co/4/14ntacted [facility], medication shipped out on 07/28/13.  Patient made aware. She does have enough medication to last until mail order arrives . Understanding expressed.   traMADol (ULTRAM) 50 MG tablet 90 tablet 0 07/24/2013     Sig - Route: Take 1 tablet (50 mg total) by mouth every 6 (six) hours as needed. - Oral    Class: Print                Pharmacy    [facility] [place], [place] - [address]

## 2013-07-30 NOTE — Telephone Encounter (Signed)
Noted  

## 2013-08-03 ENCOUNTER — Other Ambulatory Visit: Payer: Self-pay | Admitting: Internal Medicine

## 2013-08-03 ENCOUNTER — Telehealth: Payer: Self-pay | Admitting: *Deleted

## 2013-08-03 MED ORDER — TRAMADOL HCL 50 MG PO TABS: 50.0000 mg | ORAL_TABLET | Freq: Four times a day (QID) | ORAL | Status: DC | PRN

## 2013-08-03 NOTE — Telephone Encounter (Signed)
Spoke to pt asked her if she received her Tramadol in the mail. Pt said yes it came today tried to call back to let us know. Told her okay just wanted to make sure she got it.

## 2013-10-01 ENCOUNTER — Other Ambulatory Visit: Payer: Self-pay | Admitting: Internal Medicine

## 2013-10-03 ENCOUNTER — Telehealth: Payer: Self-pay | Admitting: Internal Medicine

## 2013-10-03 NOTE — Telephone Encounter (Signed)
[facility] rx renewal for traMADol (ULTRAM) 50 MG tablet

## 2013-10-05 MED ORDER — TRAMADOL HCL 50 MG PO TABS: 50.0000 mg | ORAL_TABLET | Freq: Four times a day (QID) | ORAL | Status: DC | PRN

## 2013-10-05 NOTE — Telephone Encounter (Signed)
Rx for Tramadol faxed to [facility].

## 2014-01-22 ENCOUNTER — Encounter: Payer: Self-pay | Admitting: Internal Medicine

## 2014-01-22 ENCOUNTER — Ambulatory Visit (INDEPENDENT_AMBULATORY_CARE_PROVIDER_SITE_OTHER): Payer: Medicare Other | Admitting: Internal Medicine

## 2014-01-22 VITALS — BP 130/80 | HR 68 | Temp 97.8°F | Resp 18 | Ht 59.5 in | Wt 106.0 lb

## 2014-01-22 DIAGNOSIS — I10 Essential (primary) hypertension: Secondary | ICD-10-CM

## 2014-01-22 DIAGNOSIS — R269 Unspecified abnormalities of gait and mobility: Secondary | ICD-10-CM | POA: Diagnosis not present

## 2014-01-22 DIAGNOSIS — M159 Polyosteoarthritis, unspecified: Secondary | ICD-10-CM

## 2014-01-22 DIAGNOSIS — K219 Gastro-esophageal reflux disease without esophagitis: Secondary | ICD-10-CM | POA: Diagnosis not present

## 2014-01-22 MED ORDER — QUINAPRIL HCL 40 MG PO TABS: 40.0000 mg | ORAL_TABLET | Freq: Every day | ORAL | Status: DC

## 2014-01-22 MED ORDER — DICLOFENAC POTASSIUM 50 MG PO TABS: 50.0000 mg | ORAL_TABLET | Freq: Two times a day (BID) | ORAL | Status: DC

## 2014-01-22 NOTE — Patient Instructions (Signed)
Limit your sodium (Salt) intake  Return in 6 months for follow-up  

## 2014-01-22 NOTE — Progress Notes (Signed)
Pre-visit discussion using our clinic review tool. No additional management support is needed unless otherwise documented below in the visit note.  

## 2014-01-22 NOTE — Progress Notes (Signed)
Subjective:    Patient ID: Erin Gibson, female    DOB: 09/23/1919, 80 y.o.   MRN: [id number]  HPI 80 year old patient who is seen today for followup.  She has a history of treated hypertension, which has been stable.  She has osteoarthritis. She has a history of unsteady gait and has multiple falls.  She does use a walker, but at times she tends to fall backwards.  She lives alone, but does have family close by. She has been on tramadol for additional pain control.  This has caused visual and auditory hallucinations and she no longer takes this medication.  She is requesting a refill on diclofenac.  Past Medical History  Diagnosis Date  . ANEMIA 03/31/2009  . DEGENERATIVE JOINT DISEASE, GENERALIZED 05/06/2007  . GERD 04/24/2007  . HYPERTENSION 04/24/2007  . VENOUS STASIS ULCER 07/20/2008  . DVT (deep venous thrombosis)     hx of  . Chronic kidney disease   . Diverticulosis of sigmoid colon     Colonoscopy 2011    History   Social History  . Marital Status: Widowed    Spouse Name: N/A    Number of Children: N/A  . Years of Education: N/A   Occupational History  . Not on file.   Social History Main Topics  . Smoking status: Never Smoker   . Smokeless tobacco: Never Used  . Alcohol Use: No  . Drug Use: No  . Sexual Activity: Not on file   Other Topics Concern  . Not on file   Social History Narrative  . No narrative on file    Past Surgical History  Procedure Laterality Date  . Cataract extraction    . Abdominal hysterectomy      Completion hysterectomy  . Total knee arthroplasty      x2  . Hernia repair    . Cholecystectomy open    . Partial hysterectomy      Salpingo-oophorectomy as well    No family history on file.  Allergies  Allergen Reactions  . Codeine Nausea And Vomiting    Violently ill  . Diltiazem Hcl Other (See Comments)    Not sure about this reaction  . Ibuprofen Nausea And Vomiting  . Tramadol     Auditory and visual hallucinations     Current Outpatient Prescriptions on File Prior to Visit  Medication Sig Dispense Refill  . Artificial Tear GEL Place 2 drops into both eyes 2 (two) times daily.      Marland Kitchen. aspirin EC 81 MG tablet Take 81 mg by mouth every morning.      . Calcium Carbonate-Vitamin D (CALCIUM 600 + D PO) Take 1 tablet by mouth every morning.      . Cyanocobalamin (VITAMIN B-12) 2500 MCG SUBL Place 1 tablet under the tongue every morning.      . furosemide (LASIX) 40 MG tablet Take 0.5 tablets (20 mg total) by mouth daily.  90 tablet  3  . meclizine (ANTIVERT) 25 MG tablet TAKE 1 TABLET 3 TIMES DAILYAS NEEDED FOR DIZZINESS  270 tablet  0  . nitroGLYCERIN (NITROSTAT) 0.4 MG SL tablet Place 0.4 mg under the tongue every 5 (five) minutes as needed for chest pain.      Marland Kitchen. omeprazole (PRILOSEC) 40 MG capsule Take 1 capsule (40 mg total) by mouth daily.  90 capsule  6  . polyethylene glycol powder (GLYCOLAX/MIRALAX) powder Take 17 g by mouth 2 (two) times daily.  2550 g  4  .  potassium chloride SA (K-DUR,KLOR-CON) 20 MEQ tablet Take 1 tablet (20 mEq total) by mouth daily.  90 tablet  3  . vitamin E (VITAMIN E) 400 UNIT capsule Take 400 Units by mouth every morning.       . traMADol (ULTRAM) 50 MG tablet Take 1 tablet (50 mg total) by mouth every 6 (six) hours as needed.  90 tablet  3   No current facility-administered medications on file prior to visit.    BP 130/80  Pulse 68  Temp(Src) 97.8 F (36.6 C) (Oral)  Resp 18  Ht 4' 11.5" (1.511 m)  Wt 106 lb (48.081 kg)  BMI 21.06 kg/m2  SpO2 97%       Review of Systems  Constitutional: Negative.   HENT: Negative for congestion, dental problem, hearing loss, rhinorrhea, sinus pressure, sore throat and tinnitus.   Eyes: Negative for pain, discharge and visual disturbance.  Respiratory: Negative for cough and shortness of breath.   Cardiovascular: Negative for chest pain, palpitations and leg swelling.  Gastrointestinal: Negative for nausea, vomiting,  abdominal pain, diarrhea, constipation, blood in stool and abdominal distention.  Genitourinary: Negative for dysuria, urgency, frequency, hematuria, flank pain, vaginal bleeding, vaginal discharge, difficulty urinating, vaginal pain and pelvic pain.  Musculoskeletal: Positive for arthralgias, back pain and gait problem. Negative for joint swelling.  Skin: Negative for rash.  Neurological: Negative for dizziness, syncope, speech difficulty, weakness, numbness and headaches.  Hematological: Negative for adenopathy.  Psychiatric/Behavioral: Positive for confusion. Negative for behavioral problems, dysphoric mood and agitation. The patient is not nervous/anxious.        Objective:   Physical Exam  Constitutional: She is oriented to person, place, and time. She appears well-developed and well-nourished.  HENT:  Head: Normocephalic.  Right Ear: External ear normal.  Left Ear: External ear normal.  Mouth/Throat: Oropharynx is clear and moist.  Eyes: Conjunctivae and EOM are normal. Pupils are equal, round, and reactive to light.  Neck: Normal range of motion. Neck supple. No thyromegaly present.  Cardiovascular: Normal rate, regular rhythm, normal heart sounds and intact distal pulses.   Pulmonary/Chest: Effort normal and breath sounds normal.  Marked kyphoscoliosis  Abdominal: Soft. Bowel sounds are normal. She exhibits no mass. There is no tenderness.  Musculoskeletal: Normal range of motion.  Striking osteoarthritic joint changes involving small joints of the hands  Lymphadenopathy:    She has no cervical adenopathy.  Neurological: She is alert and oriented to person, place, and time.  Skin: Skin is warm and dry. No rash noted.  Psychiatric: She has a normal mood and affect. Her behavior is normal.          Assessment & Plan:   Advanced osteoarthritis Hypertension stable Unsteady gait  Constant use of her walker and encouraged. CPX 6 months

## 2014-01-23 IMAGING — CR DG CHEST 1V PORT
1 series · 1 of 1 positions shown · non-contrast
Comparison: Acute abdominal series 03/09/2013.

CLINICAL DATA: Constipation, nausea and emesis.  Evaluate
nasogastric tube placement.

PORTABLE CHEST - 1 VIEW

[AP]
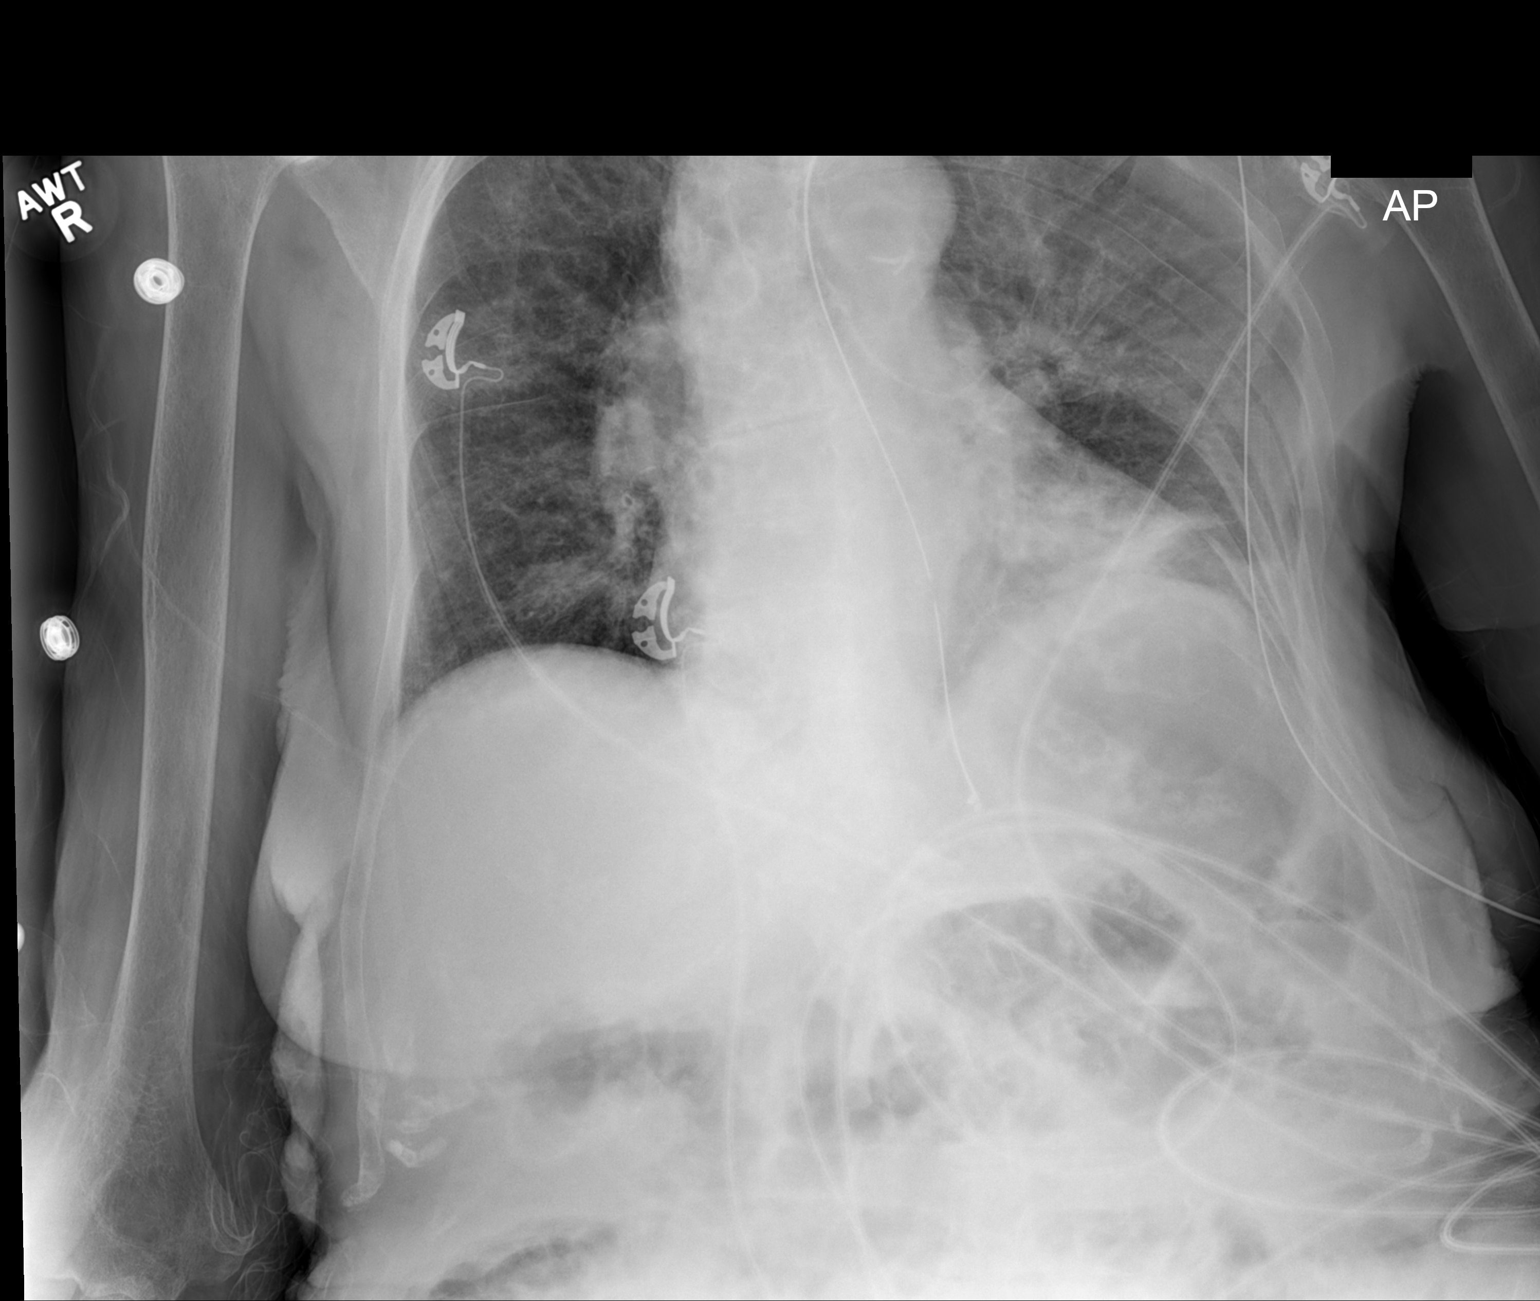

[1 of 1 positions shown; findings below may reference images not displayed]

FINDINGS: Tip of nasogastric tube is near the gastroesophageal
junction, with side port in the distal third of the esophagus.
Lung volumes are low.  Linear opacities in the left base favored to
predominately reflect subsegmental atelectasis, although the
sequelae of recent aspiration is not excluded.  No definite pleural
effusions.  Mild diffuse interstitial prominence and peribronchial
cuffing.  Mild cephalization of the pulmonary vasculature.
Thickening of the minor fissure.  Heart size appears mildly
enlarged. The patient is rotated to the left on today's exam,
resulting in distortion of the mediastinal contours and reduced
diagnostic sensitivity and specificity for mediastinal pathology.
Atherosclerosis in the thoracic aorta.
IMPRESSION: 1.  Tip of nasogastric tube is at the gastroesophageal junction and
could be advanced approximately 8-10 cm for more optimal placement.
2.  Worsening opacity in the left lower lobe which may reflect
increasing atelectasis, however, sequelae of recent aspiration is
not excluded.  Clinical correlation is recommended.
3.  Findings suggestive of developing mild interstitial pulmonary
edema, as above.
4.  Atherosclerosis.

## 2014-01-24 IMAGING — CR DG ABD PORTABLE 1V
1 series · 1 of 1 positions shown · non-contrast
Comparison: CT abdomen pelvis 03/09/2013

CLINICAL DATA: NG tube placement

PORTABLE ABDOMEN - 1 VIEW

[AP]
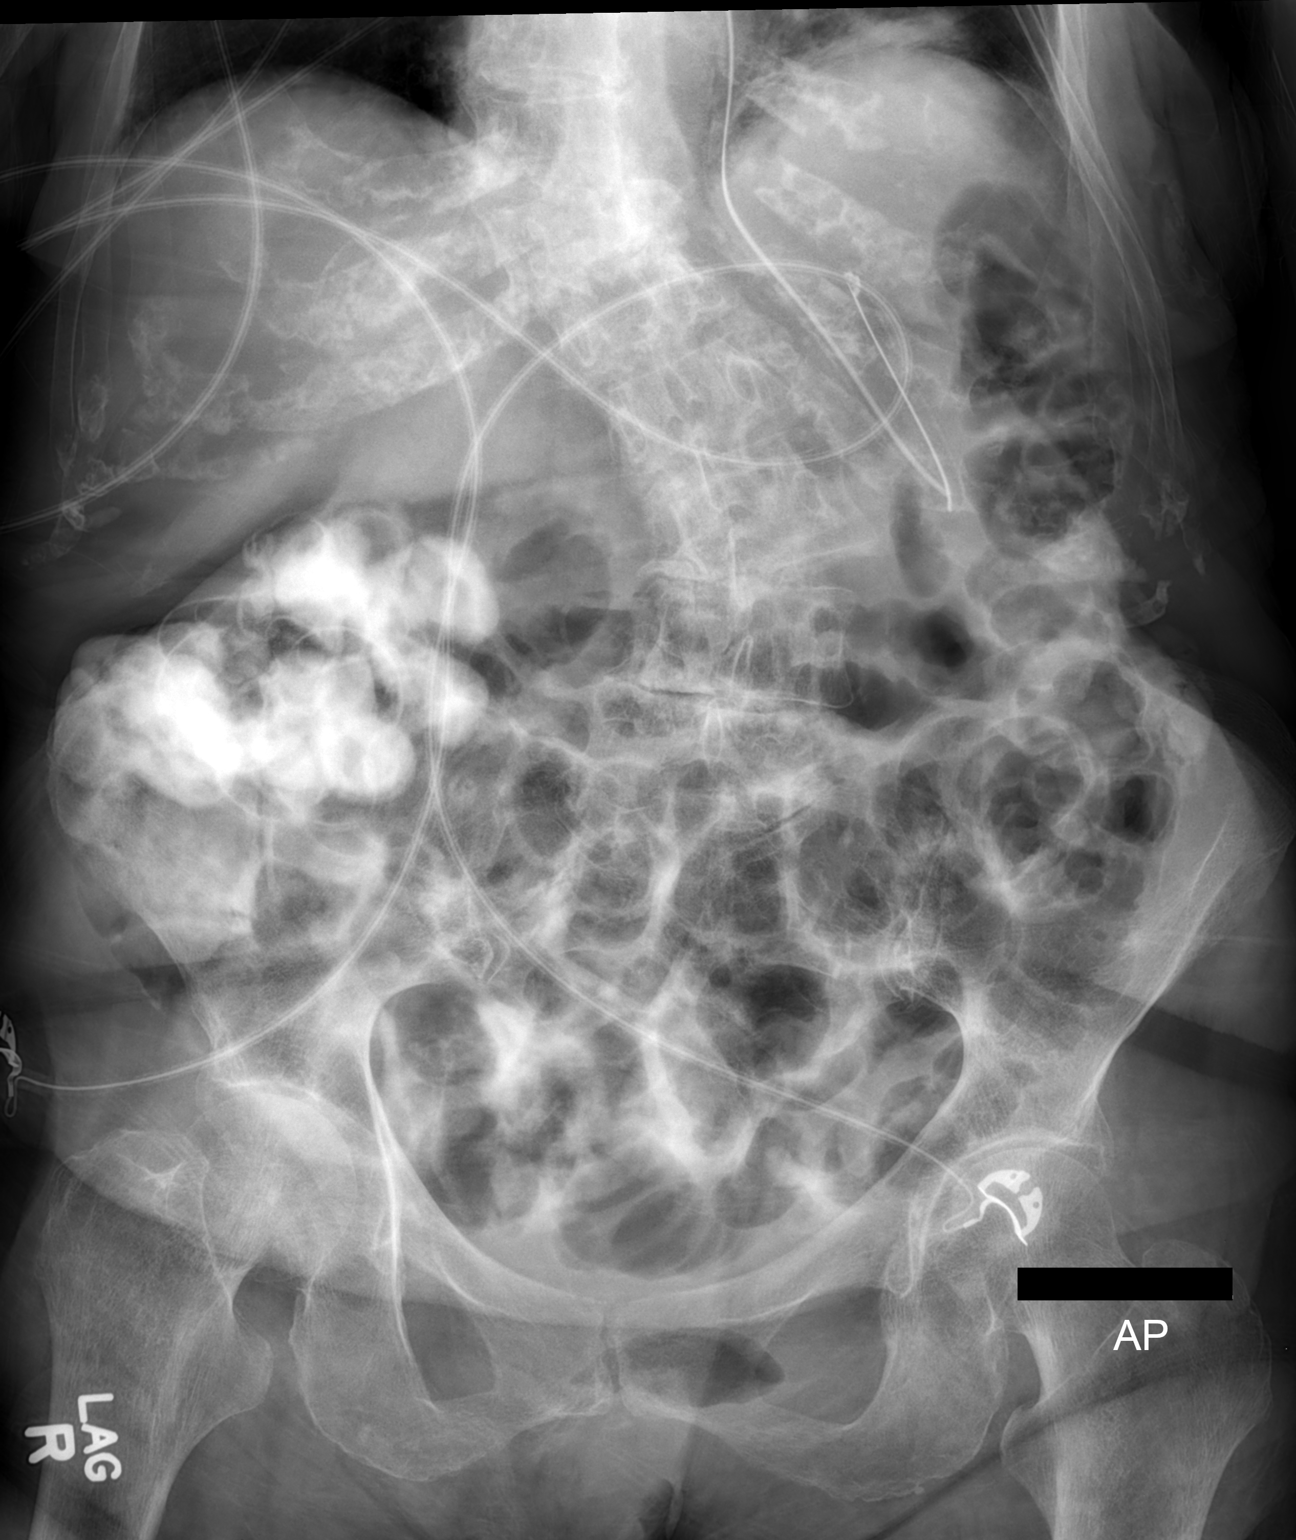

[1 of 1 positions shown; findings below may reference images not displayed]

FINDINGS: Nasogastric tube terminates in the stomach, and
satisfactory position.  Contrast material is seen within the
proximal colon.  Diffuse mild gaseous distention of small bowel
loops.

Moderate convex left scoliosis of the lumbar spine.
IMPRESSION: Satisfactory position of nasogastric tube.

## 2014-01-25 ENCOUNTER — Telehealth: Payer: Self-pay | Admitting: Internal Medicine

## 2014-01-25 NOTE — Telephone Encounter (Signed)
Relevant patient education mailed to patient.  

## 2014-02-23 DIAGNOSIS — H353290 Exudative age-related macular degeneration, unspecified eye, stage unspecified: Secondary | ICD-10-CM | POA: Diagnosis not present

## 2014-02-23 DIAGNOSIS — Z961 Presence of intraocular lens: Secondary | ICD-10-CM | POA: Diagnosis not present

## 2014-02-23 DIAGNOSIS — H353190 Nonexudative age-related macular degeneration, unspecified eye, stage unspecified: Secondary | ICD-10-CM | POA: Diagnosis not present

## 2014-03-04 DIAGNOSIS — H35369 Drusen (degenerative) of macula, unspecified eye: Secondary | ICD-10-CM | POA: Diagnosis not present

## 2014-03-04 DIAGNOSIS — H353190 Nonexudative age-related macular degeneration, unspecified eye, stage unspecified: Secondary | ICD-10-CM | POA: Diagnosis not present

## 2014-03-04 DIAGNOSIS — H353290 Exudative age-related macular degeneration, unspecified eye, stage unspecified: Secondary | ICD-10-CM | POA: Diagnosis not present

## 2014-03-04 DIAGNOSIS — H35059 Retinal neovascularization, unspecified, unspecified eye: Secondary | ICD-10-CM | POA: Diagnosis not present

## 2014-04-01 DIAGNOSIS — H35059 Retinal neovascularization, unspecified, unspecified eye: Secondary | ICD-10-CM | POA: Diagnosis not present

## 2014-04-01 DIAGNOSIS — H353290 Exudative age-related macular degeneration, unspecified eye, stage unspecified: Secondary | ICD-10-CM | POA: Diagnosis not present

## 2014-04-26 ENCOUNTER — Other Ambulatory Visit: Payer: Self-pay | Admitting: *Deleted

## 2014-04-26 MED ORDER — DICLOFENAC SODIUM 50 MG PO TBEC: 50.0000 mg | DELAYED_RELEASE_TABLET | Freq: Two times a day (BID) | ORAL | Status: DC

## 2014-04-26 NOTE — Telephone Encounter (Signed)
Spoke to pt, told her I changed the Rx to Voltaren 50 mg [facility]. Pt verbalized understanding.

## 2014-04-29 DIAGNOSIS — H353290 Exudative age-related macular degeneration, unspecified eye, stage unspecified: Secondary | ICD-10-CM | POA: Diagnosis not present

## 2014-04-29 DIAGNOSIS — H353190 Nonexudative age-related macular degeneration, unspecified eye, stage unspecified: Secondary | ICD-10-CM | POA: Diagnosis not present

## 2014-04-29 DIAGNOSIS — H35369 Drusen (degenerative) of macula, unspecified eye: Secondary | ICD-10-CM | POA: Diagnosis not present

## 2014-04-29 DIAGNOSIS — H35059 Retinal neovascularization, unspecified, unspecified eye: Secondary | ICD-10-CM | POA: Diagnosis not present

## 2014-06-10 DIAGNOSIS — H353190 Nonexudative age-related macular degeneration, unspecified eye, stage unspecified: Secondary | ICD-10-CM | POA: Diagnosis not present

## 2014-06-10 DIAGNOSIS — H353290 Exudative age-related macular degeneration, unspecified eye, stage unspecified: Secondary | ICD-10-CM | POA: Diagnosis not present

## 2014-06-10 DIAGNOSIS — H35059 Retinal neovascularization, unspecified, unspecified eye: Secondary | ICD-10-CM | POA: Diagnosis not present

## 2014-07-13 ENCOUNTER — Other Ambulatory Visit: Payer: Self-pay | Admitting: Internal Medicine

## 2014-07-30 ENCOUNTER — Encounter: Payer: Medicare Other | Admitting: Internal Medicine

## 2014-08-10 ENCOUNTER — Encounter: Payer: Self-pay | Admitting: Internal Medicine

## 2014-08-10 ENCOUNTER — Ambulatory Visit (INDEPENDENT_AMBULATORY_CARE_PROVIDER_SITE_OTHER): Payer: Medicare Other | Admitting: Internal Medicine

## 2014-08-10 DIAGNOSIS — M159 Polyosteoarthritis, unspecified: Secondary | ICD-10-CM

## 2014-08-10 DIAGNOSIS — R269 Unspecified abnormalities of gait and mobility: Secondary | ICD-10-CM

## 2014-08-10 DIAGNOSIS — I10 Essential (primary) hypertension: Secondary | ICD-10-CM

## 2014-08-10 DIAGNOSIS — Z0000 Encounter for general adult medical examination without abnormal findings: Secondary | ICD-10-CM | POA: Diagnosis not present

## 2014-08-10 DIAGNOSIS — M150 Primary generalized (osteo)arthritis: Secondary | ICD-10-CM

## 2014-08-10 DIAGNOSIS — Z23 Encounter for immunization: Secondary | ICD-10-CM | POA: Diagnosis not present

## 2014-08-10 DIAGNOSIS — K4090 Unilateral inguinal hernia, without obstruction or gangrene, not specified as recurrent: Secondary | ICD-10-CM | POA: Diagnosis not present

## 2014-08-10 LAB — CBC WITH DIFFERENTIAL/PLATELET
BASOS ABS: 0.1 10*3/uL (ref 0.0–0.1)
Basophils Relative: 1.1 % (ref 0.0–3.0)
EOS ABS: 0.3 10*3/uL (ref 0.0–0.7)
Eosinophils Relative: 5.8 % — ABNORMAL HIGH (ref 0.0–5.0)
HCT: 37.3 % (ref 36.0–46.0)
HEMOGLOBIN: 12.1 g/dL (ref 12.0–15.0)
LYMPHS PCT: 26.4 % (ref 12.0–46.0)
Lymphs Abs: 1.3 10*3/uL (ref 0.7–4.0)
MCHC: 32.3 g/dL (ref 30.0–36.0)
MCV: 91.9 fl (ref 78.0–100.0)
MONOS PCT: 8.2 % (ref 3.0–12.0)
Monocytes Absolute: 0.4 10*3/uL (ref 0.1–1.0)
NEUTROS ABS: 2.9 10*3/uL (ref 1.4–7.7)
NEUTROS PCT: 58.5 % (ref 43.0–77.0)
PLATELETS: 205 10*3/uL (ref 150.0–400.0)
RBC: 4.06 Mil/uL (ref 3.87–5.11)
RDW: 13.9 % (ref 11.5–15.5)
WBC: 5 10*3/uL (ref 4.0–10.5)

## 2014-08-10 LAB — TSH: TSH: 1.37 u[IU]/mL (ref 0.35–4.50)

## 2014-08-10 NOTE — Progress Notes (Signed)
Pre visit review using our clinic review tool, if applicable. No additional management support is needed unless otherwise documented below in the visit note. 

## 2014-08-10 NOTE — Progress Notes (Signed)
Patient ID: Erin Gibson, female   DOB: 12/09/1918, 80 y.o.   MRN: [id number] Patient ID: Erin CirriViolet W Grega, female   DOB: 08/17/1919, 80 y.o.   MRN: [id number]  Subjective:    Patient ID: Erin CirriViolet W Buchanon, female    DOB: 09/03/1919, 80 y.o.   MRN: [id number]  HPI 80 year-old patient who is seen today for a wellness exam.  Medical problems include treated hypertension. She has done quite well on diuretic therapy as well as ACE inhibition. She has osteoarthritis as well as gastroesophageal reflux disease.  No major concerns or complaints. She still lives independently. She does have a daughter in [place].  Admit date: 03/09/2013  Discharge date: 03/12/2013    Discharge Diagnoses:  Principal Problem:   Partial small bowel obstruction  Active Problems:  ANEMIA  HYPERTENSION  VENOUS STASIS ULCER  GERD  DEGENERATIVE JOINT DISEASE, GENERALIZED  Inguinal hernia, left - Contains sigmoid colon. Reducible    Here for Medicare AWV:   1. Risk factors based on Past M, S, F history: personal risk factors include peripheral vascular disease, hypertension, and age  70. Physical Activities: fairly sedentary but remains independent  3. Depression/mood: no history of depression  4. Hearing:  mild hearing impairment  5. ADL's: remains independent in aspects of daily living  6. Fall Risk: Very high fall risk. uses a walker. Has had several falls over the past 12 months 7. Home Safety: no problems identified  8. Height, weight, &visual acuity: patient has lost 7 inches in height compared to her maximal height. Has had bilateral cataract extraction surgeries with lens implantation; has been evaluated by ophthalmology recently due to macular degeneration.  She has been treated for a retinal hemorrhage with the improvement in the vision of her right eye 9. Counseling: follow mammogram scheduled next month  10. Labs ordered based on risk factors: laboratory profile will be reviewed  11. Referral Coordination  in-none appropriate except for follow-up mammogram  12. Care Plan- heart healthy diet. Discussed  13. Cognitive Assessment-alert and oriented, with normal affect  14.  Preventive services will include an annual clinical exam with screening lab.  Patient was provided with a written and personalized care plan 15.  Provider list updated.  Includes primary care and ophthalmology  Allergies:  1) ! Codeine  2) ! Ibuprofen  3) ! Cardizem   Past History:  Past Medical History:   GERD  Hypertension  DJD  Peripheral Vascular Occlusive dz  history of DVT and pulmonary embolism, 1998  carpal tunnel syndrome  unsteady gait  chronic venous insufficiency  weight loss  anemia  chronic kidney disease   Past Surgical History:  Carpal tunnel release  Cataract extraction  Hysterectomy complete  Total knee replacement x 2  gravida 3, para two, abortus one  colonoscopy in February 2003  negative cryolite stress test November 2005  Inguinal herniorrhaphy   Family History:   Family History Other cancer-Colon, Breast, Prostate  Family History of Stroke M 1st degree relative <50  Fam hx MI  Fam hx CAD  4 brothers  3 sisters All deceased except for one brother  Social History:  Widowed  for 19 years a daughter lives in [place]. Continues to live independently.  Reviewed history from 12/15/2007 and no changes required.  Never Smoked  Retired   Review of Systems  Constitutional: Negative for fever, appetite change, fatigue and unexpected weight change.  HENT: Negative for congestion, dental problem, ear pain, hearing loss, mouth sores, nosebleeds, sinus  pressure, sore throat, tinnitus, trouble swallowing and voice change.        Decreased hearing left ear  Eyes: Positive for visual disturbance. Negative for photophobia, pain and redness.  Respiratory: Negative for cough, chest tightness and shortness of breath.   Cardiovascular: Negative for chest pain, palpitations and leg swelling.   Gastrointestinal: Negative for nausea, vomiting, abdominal pain, diarrhea, constipation, blood in stool, abdominal distention and rectal pain.  Genitourinary: Negative for dysuria, urgency, frequency, hematuria, flank pain, vaginal bleeding, vaginal discharge, difficulty urinating, genital sores, vaginal pain, menstrual problem and pelvic pain.  Musculoskeletal: Positive for back pain and gait problem. Negative for arthralgias and neck stiffness.  Skin: Negative for rash.  Neurological: Negative for dizziness, syncope, speech difficulty, weakness, light-headedness, numbness and headaches.  Hematological: Negative for adenopathy. Does not bruise/bleed easily.  Psychiatric/Behavioral: Negative for suicidal ideas, behavioral problems, self-injury, dysphoric mood and agitation. The patient is not nervous/anxious.        Objective:   Physical Exam  Constitutional: She is oriented to person, place, and time. She appears well-developed and well-nourished. No distress.  Blood pressure 130/80  HENT:  Head: Normocephalic.  Right Ear: External ear normal.  Left Ear: External ear normal.  Mouth/Throat: Oropharynx is clear and moist.  Diminished hearing left ear Weber lateralizes to the right No cerumen  Eyes: Conjunctivae and EOM are normal. Pupils are equal, round, and reactive to light.  Neck: Normal range of motion. Neck supple. No thyromegaly present.  Cardiovascular: Normal rate and regular rhythm.   Murmur heard. Grade 3/6 systolic murmur with radiation into the carotid distribution Pedal pulses not easily palpable  Pulmonary/Chest: Effort normal and breath sounds normal.  Marked kyphoscoliosis  Abdominal: Soft. Bowel sounds are normal. She exhibits no mass. There is no tenderness.  Musculoskeletal: Normal range of motion.  Lymphadenopathy:    She has no cervical adenopathy.  Neurological: She is alert and oriented to person, place, and time.  Skin: Skin is warm and dry. No rash noted.   Psychiatric: She has a normal mood and affect. Her behavior is normal.          Assessment & Plan:  Preventive health examination Hypertension controlled Osteoarthritis. Reasonably stable;  Patient has permanent handicap placard  Gait instability with frequent falls.  Alternate living arrangements.  Discussed at length  Laboratory update will be reviewed No change in medication, but will attempt to moderate.  Diclofenac

## 2014-08-10 NOTE — Patient Instructions (Addendum)
Limit your sodium (Salt) intake  Please check your blood pressure on a regular basis.  If it is consistently greater than 150/90, please make an office appointment.  Return in 6 months for follow-up  Health Maintenance Adopting a healthy lifestyle and getting preventive care can go a long way to promote health and wellness. Talk with your health care provider about what schedule of regular examinations is right for you. This is a good chance for you to check in with your provider about disease prevention and staying healthy. In between checkups, there are plenty of things you can do on your own. Experts have done a lot of research about which lifestyle changes and preventive measures are most likely to keep you healthy. Ask your health care provider for more information. WEIGHT AND DIET  Eat a healthy diet  Be sure to include plenty of vegetables, fruits, low-fat dairy products, and lean protein.  Do not eat a lot of foods high in solid fats, added sugars, or salt.  Get regular exercise. This is one of the most important things you can do for your health.  Most adults should exercise for at least 150 minutes each week. The exercise should increase your heart rate and make you sweat (moderate-intensity exercise).  Most adults should also do strengthening exercises at least twice a week. This is in addition to the moderate-intensity exercise.  Maintain a healthy weight  Body mass index (BMI) is a measurement that can be used to identify possible weight problems. It estimates body fat based on height and weight. Your health care provider can help determine your BMI and help you achieve or maintain a healthy weight.  For females 20 years of age and older:   A BMI below 18.5 is considered underweight.  A BMI of 18.5 to 24.9 is normal.  A BMI of 25 to 29.9 is considered overweight.  A BMI of 30 and above is considered obese.  Watch levels of cholesterol and blood lipids  You should  start having your blood tested for lipids and cholesterol at 78 years of age, then have this test every 5 years.  You may need to have your cholesterol levels checked more often if:  Your lipid or cholesterol levels are high.  You are older than 78 years of age.  You are at high risk for heart disease.  CANCER SCREENING   Lung Cancer  Lung cancer screening is recommended for adults 55-80 years old who are at high risk for lung cancer because of a history of smoking.  A yearly low-dose CT scan of the lungs is recommended for people who:  Currently smoke.  Have quit within the past 15 years.  Have at least a 30-pack-year history of smoking. A pack year is smoking an average of one pack of cigarettes a day for 1 year.  Yearly screening should continue until it has been 15 years since you quit.  Yearly screening should stop if you develop a health problem that would prevent you from having lung cancer treatment.  Breast Cancer  Practice breast self-awareness. This means understanding how your breasts normally appear and feel.  It also means doing regular breast self-exams. Let your health care provider know about any changes, no matter how small.  If you are in your 20s or 30s, you should have a clinical breast exam (CBE) by a health care provider every 1-3 years as part of a regular health exam.  If you are 40 or older, have   CBE every year. Also consider having a breast X-ray (mammogram) every year.  If you have a family history of breast cancer, talk to your health care provider about genetic screening.  If you are at high risk for breast cancer, talk to your health care provider about having an MRI and a mammogram every year.  Breast cancer gene (BRCA) assessment is recommended for women who have family members with BRCA-related cancers. BRCA-related cancers include:  Breast.  Ovarian.  Tubal.  Peritoneal cancers.  Results of the assessment will determine the  need for genetic counseling and BRCA1 and BRCA2 testing. Cervical Cancer Routine pelvic examinations to screen for cervical cancer are no longer recommended for nonpregnant women who are considered low risk for cancer of the pelvic organs (ovaries, uterus, and vagina) and who do not have symptoms. A pelvic examination may be necessary if you have symptoms including those associated with pelvic infections. Ask your health care provider if a screening pelvic exam is right for you.   The Pap test is the screening test for cervical cancer for women who are considered at risk.  If you had a hysterectomy for a problem that was not cancer or a condition that could lead to cancer, then you no longer need Pap tests.  If you are older than 65 years, and you have had normal Pap tests for the past 10 years, you no longer need to have Pap tests.  If you have had past treatment for cervical cancer or a condition that could lead to cancer, you need Pap tests and screening for cancer for at least 20 years after your treatment.  If you no longer get a Pap test, assess your risk factors if they change (such as having a new sexual partner). This can affect whether you should start being screened again.  Some women have medical problems that increase their chance of getting cervical cancer. If this is the case for you, your health care provider may recommend more frequent screening and Pap tests.  The human papillomavirus (HPV) test is another test that may be used for cervical cancer screening. The HPV test looks for the virus that can cause cell changes in the cervix. The cells collected during the Pap test can be tested for HPV.  The HPV test can be used to screen women 87 years of age and older. Getting tested for HPV can extend the interval between normal Pap tests from three to five years.  An HPV test also should be used to screen women of any age who have unclear Pap test results.  After 78 years of age,  women should have HPV testing as often as Pap tests.  Colorectal Cancer  This type of cancer can be detected and often prevented.  Routine colorectal cancer screening usually begins at 78 years of age and continues through 78 years of age.  Your health care provider may recommend screening at an earlier age if you have risk factors for colon cancer.  Your health care provider may also recommend using home test kits to check for hidden blood in the stool.  A small camera at the end of a tube can be used to examine your colon directly (sigmoidoscopy or colonoscopy). This is done to check for the earliest forms of colorectal cancer.  Routine screening usually begins at age 39.  Direct examination of the colon should be repeated every 5-10 years through 78 years of age. However, you may need to be screened more often  often if early forms of precancerous polyps or small growths are found. Skin Cancer  Check your skin from head to toe regularly.  Tell your health care provider about any new moles or changes in moles, especially if there is a change in a mole's shape or color.  Also tell your health care provider if you have a mole that is larger than the size of a pencil eraser.  Always use sunscreen. Apply sunscreen liberally and repeatedly throughout the day.  Protect yourself by wearing long sleeves, pants, a wide-brimmed hat, and sunglasses whenever you are outside. HEART DISEASE, DIABETES, AND HIGH BLOOD PRESSURE   Have your blood pressure checked at least every 1-2 years. High blood pressure causes heart disease and increases the risk of stroke.  If you are between 55 years and 79 years old, ask your health care provider if you should take aspirin to prevent strokes.  Have regular diabetes screenings. This involves taking a blood sample to check your fasting blood sugar level.  If you are at a normal weight and have a low risk for diabetes, have this test once every three years after 78  years of age.  If you are overweight and have a high risk for diabetes, consider being tested at a younger age or more often. PREVENTING INFECTION  Hepatitis B  If you have a higher risk for hepatitis B, you should be screened for this virus. You are considered at high risk for hepatitis B if:  You were born in a country where hepatitis B is common. Ask your health care provider which countries are considered high risk.  Your parents were born in a high-risk country, and you have not been immunized against hepatitis B (hepatitis B vaccine).  You have HIV or AIDS.  You use needles to inject street drugs.  You live with someone who has hepatitis B.  You have had sex with someone who has hepatitis B.  You get hemodialysis treatment.  You take certain medicines for conditions, including cancer, organ transplantation, and autoimmune conditions. Hepatitis C  Blood testing is recommended for:  Everyone born from 1945 through 1965.  Anyone with known risk factors for hepatitis C. Sexually transmitted infections (STIs)  You should be screened for sexually transmitted infections (STIs) including gonorrhea and chlamydia if:  You are sexually active and are younger than 78 years of age.  You are older than 78 years of age and your health care provider tells you that you are at risk for this type of infection.  Your sexual activity has changed since you were last screened and you are at an increased risk for chlamydia or gonorrhea. Ask your health care provider if you are at risk.  If you do not have HIV, but are at risk, it may be recommended that you take a prescription medicine daily to prevent HIV infection. This is called pre-exposure prophylaxis (PrEP). You are considered at risk if:  You are sexually active and do not regularly use condoms or know the HIV status of your partner(s).  You take drugs by injection.  You are sexually active with a partner who has HIV. Talk with  your health care provider about whether you are at high risk of being infected with HIV. If you choose to begin PrEP, you should first be tested for HIV. You should then be tested every 3 months for as long as you are taking PrEP.  PREGNANCY   If you are premenopausal and you may become   ask your health care provider about preconception counseling.  If you may become pregnant, take 400 to 800 micrograms (mcg) of folic acid every day.  If you want to prevent pregnancy, talk to your health care provider about birth control (contraception). OSTEOPOROSIS AND MENOPAUSE   Osteoporosis is a disease in which the bones lose minerals and strength with aging. This can result in serious bone fractures. Your risk for osteoporosis can be identified using a bone density scan.  If you are 73 years of age or older, or if you are at risk for osteoporosis and fractures, ask your health care provider if you should be screened.  Ask your health care provider whether you should take a calcium or vitamin D supplement to lower your risk for osteoporosis.  Menopause may have certain physical symptoms and risks.  Hormone replacement therapy may reduce some of these symptoms and risks. Talk to your health care provider about whether hormone replacement therapy is right for you.  HOME CARE INSTRUCTIONS   Schedule regular health, dental, and eye exams.  Stay current with your immunizations.   Do not use any tobacco products including cigarettes, chewing tobacco, or electronic cigarettes.  If you are pregnant, do not drink alcohol.  If you are breastfeeding, limit how much and how often you drink alcohol.  Limit alcohol intake to no more than 1 drink per day for nonpregnant women. One drink equals 12 ounces of beer, 5 ounces of wine, or 1 ounces of hard liquor.  Do not use street drugs.  Do not share needles.  Ask your health care provider for help if you need support or information about quitting  drugs.  Tell your health care provider if you often feel depressed.  Tell your health care provider if you have ever been abused or do not feel safe at home. Document Released: 03/26/2011 Document Revised: 01/25/2014 Document Reviewed: 08/12/2013 [facility] Patient Information 2015 [place], [place]. This information is not intended to replace advice given to you by your health care provider. Make sure you discuss any questions you have with your health care provider.

## 2014-08-11 DIAGNOSIS — H43813 Vitreous degeneration, bilateral: Secondary | ICD-10-CM | POA: Diagnosis not present

## 2014-08-11 DIAGNOSIS — H3532 Exudative age-related macular degeneration: Secondary | ICD-10-CM | POA: Diagnosis not present

## 2014-08-11 DIAGNOSIS — H3531 Nonexudative age-related macular degeneration: Secondary | ICD-10-CM | POA: Diagnosis not present

## 2014-08-11 LAB — COMPREHENSIVE METABOLIC PANEL
ALBUMIN: 4 g/dL (ref 3.5–5.2)
ALT: 14 U/L (ref 0–35)
AST: 21 U/L (ref 0–37)
Alkaline Phosphatase: 112 U/L (ref 39–117)
BUN: 33 mg/dL — ABNORMAL HIGH (ref 6–23)
CALCIUM: 9.7 mg/dL (ref 8.4–10.5)
CHLORIDE: 109 meq/L (ref 96–112)
CO2: 21 meq/L (ref 19–32)
CREATININE: 1.5 mg/dL — AB (ref 0.4–1.2)
GFR: 35.75 mL/min — AB (ref >60.00)
Glucose, Bld: 79 mg/dL (ref 70–99)
POTASSIUM: 5.3 meq/L — AB (ref 3.5–5.1)
Sodium: 142 mEq/L (ref 135–145)
Total Bilirubin: 0.9 mg/dL (ref 0.2–1.2)
Total Protein: 6.9 g/dL (ref 6.0–8.3)

## 2014-08-30 ENCOUNTER — Other Ambulatory Visit: Payer: Self-pay | Admitting: Internal Medicine

## 2014-10-20 DIAGNOSIS — H3531 Nonexudative age-related macular degeneration: Secondary | ICD-10-CM | POA: Diagnosis not present

## 2014-10-20 DIAGNOSIS — H43813 Vitreous degeneration, bilateral: Secondary | ICD-10-CM | POA: Diagnosis not present

## 2014-10-20 DIAGNOSIS — H3532 Exudative age-related macular degeneration: Secondary | ICD-10-CM | POA: Diagnosis not present

## 2014-12-20 DIAGNOSIS — Z0279 Encounter for issue of other medical certificate: Secondary | ICD-10-CM

## 2014-12-23 ENCOUNTER — Other Ambulatory Visit: Payer: Self-pay | Admitting: Internal Medicine

## 2015-01-04 DIAGNOSIS — H908 Mixed conductive and sensorineural hearing loss, unspecified: Secondary | ICD-10-CM | POA: Diagnosis not present

## 2015-01-04 DIAGNOSIS — H6982 Other specified disorders of Eustachian tube, left ear: Secondary | ICD-10-CM | POA: Diagnosis not present

## 2015-01-04 DIAGNOSIS — H903 Sensorineural hearing loss, bilateral: Secondary | ICD-10-CM | POA: Diagnosis not present

## 2015-01-04 DIAGNOSIS — H6505 Acute serous otitis media, recurrent, left ear: Secondary | ICD-10-CM | POA: Diagnosis not present

## 2015-01-11 DIAGNOSIS — H3531 Nonexudative age-related macular degeneration: Secondary | ICD-10-CM | POA: Diagnosis not present

## 2015-01-11 DIAGNOSIS — H3532 Exudative age-related macular degeneration: Secondary | ICD-10-CM | POA: Diagnosis not present

## 2015-01-11 DIAGNOSIS — H43813 Vitreous degeneration, bilateral: Secondary | ICD-10-CM | POA: Diagnosis not present

## 2015-02-02 DIAGNOSIS — H6982 Other specified disorders of Eustachian tube, left ear: Secondary | ICD-10-CM | POA: Diagnosis not present

## 2015-02-02 DIAGNOSIS — H903 Sensorineural hearing loss, bilateral: Secondary | ICD-10-CM | POA: Diagnosis not present

## 2015-02-02 DIAGNOSIS — H908 Mixed conductive and sensorineural hearing loss, unspecified: Secondary | ICD-10-CM | POA: Diagnosis not present

## 2015-02-02 DIAGNOSIS — H6522 Chronic serous otitis media, left ear: Secondary | ICD-10-CM | POA: Diagnosis not present

## 2015-02-08 ENCOUNTER — Encounter: Payer: Self-pay | Admitting: Internal Medicine

## 2015-02-08 ENCOUNTER — Ambulatory Visit (INDEPENDENT_AMBULATORY_CARE_PROVIDER_SITE_OTHER): Payer: Medicare Other | Admitting: Internal Medicine

## 2015-02-08 VITALS — BP 120/70 | HR 71 | Temp 98.0°F | Resp 18 | Ht 59.0 in | Wt 104.0 lb

## 2015-02-08 DIAGNOSIS — I10 Essential (primary) hypertension: Secondary | ICD-10-CM | POA: Diagnosis not present

## 2015-02-08 DIAGNOSIS — M159 Polyosteoarthritis, unspecified: Secondary | ICD-10-CM

## 2015-02-08 DIAGNOSIS — R269 Unspecified abnormalities of gait and mobility: Secondary | ICD-10-CM | POA: Diagnosis not present

## 2015-02-08 DIAGNOSIS — D6489 Other specified anemias: Secondary | ICD-10-CM

## 2015-02-08 DIAGNOSIS — M150 Primary generalized (osteo)arthritis: Secondary | ICD-10-CM | POA: Diagnosis not present

## 2015-02-08 NOTE — Progress Notes (Signed)
Pre visit review using our clinic review tool, if applicable. No additional management support is needed unless otherwise documented below in the visit note. 

## 2015-02-08 NOTE — Progress Notes (Signed)
Subjective:    Patient ID: Erin CirriViolet W Moroz, female    DOB: 07/02/1919, 81 y.o.   MRN: [id number]  HPI   81 year old who is seen today in follow-up. She has a history of essential hypertension which has been well-controlled.  She has osteoarthritis.  She has a history of venous stasis disease with ulceration that has also been stable.  She does use compression hose. General doing quite well over the past 6 months.  [place]   She has seen ENT recently and has a left tympanoplasty tube for serous otitis media  Past Medical History  Diagnosis Date  . ANEMIA 03/31/2009  . DEGENERATIVE JOINT DISEASE, GENERALIZED 05/06/2007  . GERD 04/24/2007  . HYPERTENSION 04/24/2007  . VENOUS STASIS ULCER 07/20/2008  . DVT (deep venous thrombosis)     hx of  . Chronic kidney disease   . Diverticulosis of sigmoid colon     Colonoscopy 2011    History   Social History  . Marital Status: Widowed    Spouse Name: N/A  . Number of Children: N/A  . Years of Education: N/A   Occupational History  . Not on file.   Social History Main Topics  . Smoking status: Never Smoker   . Smokeless tobacco: Never Used  . Alcohol Use: No  . Drug Use: No  . Sexual Activity: Not on file   Other Topics Concern  . Not on file   Social History Narrative    Past Surgical History  Procedure Laterality Date  . Cataract extraction    . Abdominal hysterectomy      Completion hysterectomy  . Total knee arthroplasty      x2  . Hernia repair    . Cholecystectomy open    . Partial hysterectomy      Salpingo-oophorectomy as well    No family history on file.  Allergies  Allergen Reactions  . Codeine Nausea And Vomiting    Violently ill  . Diltiazem Hcl Other (See Comments)    Not sure about this reaction  . Ibuprofen Nausea And Vomiting  . Tramadol     Auditory and visual hallucinations    Current Outpatient Prescriptions on File Prior to Visit    Medication Sig Dispense Refill  . Artificial Tear GEL Place 2 drops into both eyes 2 (two) times daily.    Marland Kitchen. aspirin EC 81 MG tablet Take 81 mg by mouth every morning.    . Calcium Carbonate-Vitamin D (CALCIUM 600 + D PO) Take 1 tablet by mouth every morning.    . Cyanocobalamin (VITAMIN B-12) 2500 MCG SUBL Place 1 tablet under the tongue every morning.    . diclofenac (VOLTAREN) 50 MG EC tablet Take 1 tablet (50 mg total) by mouth 2 (two) times daily. 180 tablet 3  . furosemide (LASIX) 40 MG tablet TAKE 1/2 TABLET (=20MG )    DAILY 45 tablet 0  . meclizine (ANTIVERT) 25 MG tablet TAKE 1 TABLET 3 TIMES A DAYAS NEEDED FOR DIZZINESS 270 tablet 0  . nitroGLYCERIN (NITROSTAT) 0.4 MG SL tablet Place 0.4 mg under the tongue every 5 (five) minutes as needed for chest pain.    Marland Kitchen. omeprazole (PRILOSEC) 40 MG capsule TAKE 1 CAPSULE DAILY 90 capsule 0  . polyethylene glycol powder (GLYCOLAX/MIRALAX) powder MIX 17GM IN LIQUID AND     DRINK TWO TIMES A DAY 3162 g 3  . potassium chloride SA (K-DUR,KLOR-CON) 20 MEQ tablet Take 1  tablet (20 mEq total) by mouth daily. 90 tablet 3  . quinapril (ACCUPRIL) 40 MG tablet Take 1 tablet (40 mg total) by mouth daily. 90 tablet 3  . vitamin E (VITAMIN E) 400 UNIT capsule Take 400 Units by mouth every morning.      No current facility-administered medications on file prior to visit.    BP 120/70 mmHg  Pulse 71  Temp(Src) 98 F (36.7 C) (Oral)  Resp 18  Ht 4\' 11"  (1.499 m)  Wt 104 lb (47.174 kg)  BMI 20.99 kg/m2  SpO2 97%     Review of Systems  Constitutional: Negative.   HENT: Positive for hearing loss. Negative for congestion, dental problem, rhinorrhea, sinus pressure, sore throat and tinnitus.   Eyes: Negative for pain, discharge and visual disturbance.  Respiratory: Negative for cough and shortness of breath.   Cardiovascular: Negative for chest pain, palpitations and leg swelling.  Gastrointestinal: Negative for nausea, vomiting, abdominal pain,  diarrhea, constipation, blood in stool and abdominal distention.  Genitourinary: Negative for dysuria, urgency, frequency, hematuria, flank pain, vaginal bleeding, vaginal discharge, difficulty urinating, vaginal pain and pelvic pain.  Musculoskeletal: Negative for joint swelling, arthralgias and gait problem.  Skin: Negative for rash.  Neurological: Negative for dizziness, syncope, speech difficulty, weakness, numbness and headaches.  Hematological: Negative for adenopathy.  Psychiatric/Behavioral: Negative for behavioral problems, dysphoric mood and agitation. The patient is not nervous/anxious.        Objective:   Physical Exam  Constitutional: She is oriented to person, place, and time. She appears well-developed and well-nourished. No distress.  Blood pressure 120/70  HENT:  Head: Normocephalic.  Right Ear: External ear normal.  Left Ear: External ear normal.  Mouth/Throat: Oropharynx is clear and moist.  Tympanoplasty tube.  Left TM  Eyes: Conjunctivae and EOM are normal. Pupils are equal, round, and reactive to light.  Neck: Normal range of motion. Neck supple. No thyromegaly present.  Cardiovascular: Normal rate, regular rhythm, normal heart sounds and intact distal pulses.   Pulmonary/Chest: Effort normal and breath sounds normal.  Marked kyphoscoliosis  Abdominal: Soft. Bowel sounds are normal. She exhibits no mass. There is no tenderness.  Musculoskeletal: Normal range of motion.  Lymphadenopathy:    She has no cervical adenopathy.  Neurological: She is alert and oriented to person, place, and time.  Skin: Skin is warm and dry. No rash noted.  Psychiatric: She has a normal mood and affect. Her behavior is normal.          Assessment & Plan:   Essential hypertension, stable.  We'll continue low-salt diet and present regimen Status post serous otitis media.  Tympanoplasty tube placed Osteoarthritis History of macular degeneration.  Follow-up ophthalmology Gait  abnormality.  Will continue assistance with a walker  CPX 6 months Medicines updated

## 2015-02-08 NOTE — Patient Instructions (Signed)
Limit your sodium (Salt) intake  Take a calcium supplement, plus [phone] units of vitamin D  Return in 6 months for follow-up  Please check your blood pressure on a regular basis.  If it is consistently greater than 150/90, please make an office appointment.

## 2015-03-10 ENCOUNTER — Other Ambulatory Visit: Payer: Self-pay | Admitting: *Deleted

## 2015-03-10 MED ORDER — OMEPRAZOLE 40 MG PO CPDR: 40.0000 mg | DELAYED_RELEASE_CAPSULE | Freq: Every day | ORAL | Status: DC

## 2015-03-10 MED ORDER — FUROSEMIDE 40 MG PO TABS
ORAL_TABLET | ORAL | Status: DC
Start: 2015-03-10 — End: 2015-10-06

## 2015-03-10 MED ORDER — QUINAPRIL HCL 40 MG PO TABS: 40.0000 mg | ORAL_TABLET | Freq: Every day | ORAL | Status: DC

## 2015-03-17 DIAGNOSIS — H6982 Other specified disorders of Eustachian tube, left ear: Secondary | ICD-10-CM | POA: Diagnosis not present

## 2015-03-17 DIAGNOSIS — H903 Sensorineural hearing loss, bilateral: Secondary | ICD-10-CM | POA: Diagnosis not present

## 2015-04-05 DIAGNOSIS — H3532 Exudative age-related macular degeneration: Secondary | ICD-10-CM | POA: Diagnosis not present

## 2015-04-05 DIAGNOSIS — H43813 Vitreous degeneration, bilateral: Secondary | ICD-10-CM | POA: Diagnosis not present

## 2015-04-05 DIAGNOSIS — H3531 Nonexudative age-related macular degeneration: Secondary | ICD-10-CM | POA: Diagnosis not present

## 2015-05-08 ENCOUNTER — Emergency Department (HOSPITAL_COMMUNITY)
Admission: EM | Admit: 2015-05-08 | Discharge: 2015-05-08 | Disposition: A | Discharge status: Home / Self Care | Payer: Medicare Other | Attending: Emergency Medicine | Admitting: Emergency Medicine

## 2015-05-08 ENCOUNTER — Emergency Department (HOSPITAL_COMMUNITY): Payer: Medicare Other

## 2015-05-08 ENCOUNTER — Encounter (HOSPITAL_COMMUNITY): Payer: Self-pay | Admitting: Emergency Medicine

## 2015-05-08 DIAGNOSIS — Y998 Other external cause status: Secondary | ICD-10-CM | POA: Insufficient documentation

## 2015-05-08 DIAGNOSIS — Z86718 Personal history of other venous thrombosis and embolism: Secondary | ICD-10-CM | POA: Diagnosis not present

## 2015-05-08 DIAGNOSIS — Z79899 Other long term (current) drug therapy: Secondary | ICD-10-CM | POA: Diagnosis not present

## 2015-05-08 DIAGNOSIS — Z872 Personal history of diseases of the skin and subcutaneous tissue: Secondary | ICD-10-CM | POA: Diagnosis not present

## 2015-05-08 DIAGNOSIS — N189 Chronic kidney disease, unspecified: Secondary | ICD-10-CM | POA: Insufficient documentation

## 2015-05-08 DIAGNOSIS — Y92009 Unspecified place in unspecified non-institutional (private) residence as the place of occurrence of the external cause: Secondary | ICD-10-CM | POA: Insufficient documentation

## 2015-05-08 DIAGNOSIS — K219 Gastro-esophageal reflux disease without esophagitis: Secondary | ICD-10-CM | POA: Insufficient documentation

## 2015-05-08 DIAGNOSIS — Y9389 Activity, other specified: Secondary | ICD-10-CM | POA: Insufficient documentation

## 2015-05-08 DIAGNOSIS — S3690XA Unspecified injury of unspecified intra-abdominal organ, initial encounter: Secondary | ICD-10-CM | POA: Diagnosis not present

## 2015-05-08 DIAGNOSIS — S299XXA Unspecified injury of thorax, initial encounter: Secondary | ICD-10-CM | POA: Diagnosis not present

## 2015-05-08 DIAGNOSIS — S20211A Contusion of right front wall of thorax, initial encounter: Secondary | ICD-10-CM | POA: Insufficient documentation

## 2015-05-08 DIAGNOSIS — I129 Hypertensive chronic kidney disease with stage 1 through stage 4 chronic kidney disease, or unspecified chronic kidney disease: Secondary | ICD-10-CM | POA: Insufficient documentation

## 2015-05-08 DIAGNOSIS — Z862 Personal history of diseases of the blood and blood-forming organs and certain disorders involving the immune mechanism: Secondary | ICD-10-CM | POA: Diagnosis not present

## 2015-05-08 DIAGNOSIS — W01190A Fall on same level from slipping, tripping and stumbling with subsequent striking against furniture, initial encounter: Secondary | ICD-10-CM | POA: Diagnosis not present

## 2015-05-08 DIAGNOSIS — R0781 Pleurodynia: Secondary | ICD-10-CM | POA: Diagnosis not present

## 2015-05-08 DIAGNOSIS — S79911A Unspecified injury of right hip, initial encounter: Secondary | ICD-10-CM | POA: Diagnosis not present

## 2015-05-08 DIAGNOSIS — Z7982 Long term (current) use of aspirin: Secondary | ICD-10-CM | POA: Insufficient documentation

## 2015-05-08 MED ORDER — QUINAPRIL HCL 10 MG PO TABS: 40.0000 mg | ORAL_TABLET | Freq: Once | ORAL | Status: DC

## 2015-05-08 MED ORDER — LISINOPRIL 40 MG PO TABS
40.0000 mg | ORAL_TABLET | Freq: Once | ORAL | Status: AC
  Administered 2015-05-08: 40 mg via ORAL
  Filled 2015-05-08: qty 1

## 2015-05-08 MED ORDER — HYDROCODONE-ACETAMINOPHEN 5-325 MG PO TABS: 1.0000 | ORAL_TABLET | Freq: Once | ORAL | Status: DC

## 2015-05-08 MED ORDER — FUROSEMIDE 40 MG PO TABS
20.0000 mg | ORAL_TABLET | Freq: Once | ORAL | Status: AC
  Administered 2015-05-08: 20 mg via ORAL
  Filled 2015-05-08: qty 1

## 2015-05-08 MED ORDER — HYDROCODONE-ACETAMINOPHEN 5-325 MG PO TABS
1.0000 | ORAL_TABLET | Freq: Once | ORAL | Status: AC
  Administered 2015-05-08: 1 via ORAL
  Filled 2015-05-08: qty 1

## 2015-05-08 NOTE — ED Notes (Signed)
Bed: WA21 Expected date:  Expected time:  Means of arrival:  Comments: Fall 

## 2015-05-08 NOTE — ED Provider Notes (Signed)
CSN: [id number]     Arrival date & time 05/08/15  1217 History   First MD Initiated Contact with Patient 05/08/15 1406     Chief Complaint  Patient presents with  . Fall     (Consider location/radiation/quality/duration/timing/severity/associated sxs/prior Treatment) HPI Comments: 81 year old female with past medical history including hypertension, arthritis, CK D who presents with right chest wall pain. 3 days ago, the patient was at home and tripped over her shoe, falling against the table on her right side. She did not hit her head or back or lose consciousness. She was eventually able to crawl over to a piece of furniture to help herself get up. She states that she initially felt like the wind got knocked out of her but later felt like she was doing okay and decided not to seek medical attention at that time. She states that since the fall she has continued to have severe right lateral rib pain where she hit the table. She has had some mild right upper arm pain but is able to move her arm. She denies any headache, neck pain, shortness of breath, or abdominal pain. She does endorse some mild pain in her back to treat her shoulder blades. No extremity numbness or weakness. She presents today because the pain was so severe over night that she cannot lay on her right side. No fevers.  Patient is a 81 y.o. female presenting with fall. The history is provided by the patient.  Fall    Past Medical History  Diagnosis Date  . ANEMIA 03/31/2009  . DEGENERATIVE JOINT DISEASE, GENERALIZED 05/06/2007  . GERD 04/24/2007  . HYPERTENSION 04/24/2007  . VENOUS STASIS ULCER 07/20/2008  . DVT (deep venous thrombosis)     hx of  . Chronic kidney disease   . Diverticulosis of sigmoid colon     Colonoscopy 2011   Past Surgical History  Procedure Laterality Date  . Cataract extraction    . Abdominal hysterectomy      Completion hysterectomy  . Total knee arthroplasty      x2  . Hernia repair    .  Cholecystectomy open    . Partial hysterectomy      Salpingo-oophorectomy as well   History reviewed. No pertinent family history. Social History  Substance Use Topics  . Smoking status: Never Smoker   . Smokeless tobacco: Never Used  . Alcohol Use: No   OB History    No data available     Review of Systems  10 Systems reviewed and are negative for acute change except as noted in the HPI.   Allergies  Codeine; Diltiazem hcl; Ibuprofen; and Tramadol  Home Medications   Prior to Admission medications   Medication Sig Start Date End Date Taking? Authorizing Provider  Artificial Tear GEL Place 2 drops into both eyes 2 (two) times daily.   Yes Historical Provider, MD  aspirin EC 81 MG tablet Take 81 mg by mouth every morning.   Yes Historical Provider, MD  Calcium Carbonate-Vitamin D (CALCIUM 600 + D PO) Take 1 tablet by mouth every morning.   Yes Historical Provider, MD  Cyanocobalamin (VITAMIN B-12) 2500 MCG SUBL Place 1 tablet under the tongue every morning.   Yes Historical Provider, MD  diclofenac (VOLTAREN) 50 MG EC tablet Take 1 tablet (50 mg total) by mouth 2 (two) times daily. 04/26/14  Yes Gordy Savers, MD  furosemide (LASIX) 40 MG tablet TAKE 1/2 TABLET (=20MG )    DAILY 03/10/15  Yes  Gordy Savers, MD  meclizine (ANTIVERT) 25 MG tablet TAKE 1 TABLET 3 TIMES A DAYAS NEEDED FOR DIZZINESS 12/23/14  Yes Gordy Savers, MD  nitroGLYCERIN (NITROSTAT) 0.4 MG SL tablet Place 0.4 mg under the tongue every 5 (five) minutes as needed for chest pain.   Yes Historical Provider, MD  omeprazole (PRILOSEC) 40 MG capsule Take 1 capsule (40 mg total) by mouth daily. 03/10/15  Yes Gordy Savers, MD  polyethylene glycol powder (GLYCOLAX/MIRALAX) powder MIX 17GM IN LIQUID AND     DRINK TWO TIMES A DAY 08/30/14  Yes Gordy Savers, MD  potassium chloride SA (K-DUR,KLOR-CON) 20 MEQ tablet Take 1 tablet (20 mEq total) by mouth daily. 07/24/13  Yes Gordy Savers, MD   quinapril (ACCUPRIL) 40 MG tablet Take 1 tablet (40 mg total) by mouth daily. 03/10/15  Yes Gordy Savers, MD  vitamin E (VITAMIN E) 400 UNIT capsule Take 400 Units by mouth every morning.    Yes Historical Provider, MD  HYDROcodone-acetaminophen (NORCO/VICODIN) 5-325 MG per tablet Take 1 tablet by mouth once. 05/08/15   Ambrose Finland Little, MD   BP 190/63 mmHg  Pulse 55  Temp(Src) 98.5 F (36.9 C) (Oral)  Resp 14  SpO2 96% Physical Exam  Constitutional: She is oriented to person, place, and time. No distress.  Elderly, frail-appearing woman in no acute distress  HENT:  Head: Normocephalic and atraumatic.  Moist mucous membranes  Eyes: Conjunctivae are normal. Pupils are equal, round, and reactive to light.  Neck: Neck supple.  Cardiovascular: Normal rate, regular rhythm and normal heart sounds.   No murmur heard. Pulmonary/Chest: Effort normal and breath sounds normal.  R lateral chest wall tenderness  Abdominal: Soft. Bowel sounds are normal. She exhibits no distension. There is no tenderness.  Musculoskeletal: She exhibits no edema.  Normal range of motion right shoulder and elbow and no tenderness to palpation of the humerus; mild tenderness of the mid thoracic spine between scapulae, normal strength and sensation times all 4 extremities  Neurological: She is alert and oriented to person, place, and time.  Fluent speech  Skin: Skin is warm and dry.  Small ecchymosis right lateral chest wall midaxillary line, scattered small ecchymoses right humerus  Psychiatric: She has a normal mood and affect. Judgment normal.  Nursing note and vitals reviewed.   ED Course  Procedures (including critical care time) Labs Review Labs Reviewed - No data to display  Imaging Review Dg Chest 2 View  05/08/2015   CLINICAL DATA:  Acute right flank pain after fall at home.  EXAM: CHEST  2 VIEW  COMPARISON:  March 09, 2013.  FINDINGS: The heart size and mediastinal contours are within normal  limits. No pneumothorax or pleural effusion is noted. Left basilar subsegmental atelectasis is noted. Right lung is clear. Moderate severe dextroscoliosis of upper thoracic spine is noted. Severe degenerative changes seen involving both glenohumeral joints.  IMPRESSION: Mild left basilar subsegmental atelectasis or scarring. No other significant change compared to prior exam.   Electronically Signed   By: Lupita Raider, M.D.   On: 05/08/2015 15:13   Dg Hip Unilat With Pelvis 2-3 Views Right  05/08/2015   CLINICAL DATA:  Acute right flank pain after fall at home. Initial encounter.  EXAM: DG HIP (WITH OR WITHOUT PELVIS) 2-3V RIGHT  COMPARISON:  None.  FINDINGS: No fracture or dislocation is noted. Severe narrowing of both hip joints is noted consistent with degenerative joint disease.  IMPRESSION: Severe degenerative  joint disease of both hips. No acute abnormality seen in the right hip.   Electronically Signed   By: Lupita Raider, M.D.   On: 05/08/2015 15:10   I, Ambrose Finland Little, personally reviewed and evaluated these images and lab results as part of my medical decision-making.   EKG Interpretation None       Medications  HYDROcodone-acetaminophen (NORCO/VICODIN) 5-325 MG per tablet 1 tablet (1 tablet Oral Given 05/08/15 1511)  furosemide (LASIX) tablet 20 mg (20 mg Oral Given 05/08/15 1529)  lisinopril (PRINIVIL,ZESTRIL) tablet 40 mg (40 mg Oral Given 05/08/15 1540)     MDM   Final diagnoses:  Chest wall contusion, right, initial encounter   81 year old female who presents with right lateral chest wall pain after a fall 3 days ago. Patient well-appearing with normal vital signs at presentation. She had right lateral chest wall pain but no respiratory distress. Normal distal neurologic exam. She denies striking her head and she has been ambulatory since the event. Obtained plain films of chest and right hip as she complains of right hip pain but always has chronic hip pain due to her  arthritis. Gave the patient Vicodin for pain.  X-rays show no obvious rib fractures or pneumothorax. No acute findings of the hip. I reviewed the chest films that showed no injury of the mid thoracic spine where the patient was complaining of mild pain. I have reviewed instructions for supportive care of chest wall contusion including incentive spirometry and pain control. Patient has CK D and is unable to take NSAIDs, therefore I have reviewed risks and benefits of narcotic use and the patient and her daughter agreed to a short course of narcotics as needed for severe pain. Instructed her to use extreme caution when taking these medications. Instructed to follow up with PCP if symptoms unimproved. Reviewed return precautions including severe pain, worsening shortness of breath, or fever. The patient and her daughter voiced understanding and patient was discharged in satisfactory condition.    Laurence Spates, MD 05/08/15 [id number]

## 2015-05-08 NOTE — ED Notes (Signed)
Pt lives alone and has a Hx of some falls.  Walks with walker.  Fell Thursday afternoon against a table.  C/O R arm and R rib/flank area pain.  Mild bruising to R rib area.  140/86 BP 73P 18RR Pain 6 of 10  Ambulatory with assistance.

## 2015-06-24 ENCOUNTER — Telehealth: Payer: Self-pay | Admitting: Internal Medicine

## 2015-06-24 MED ORDER — DICLOFENAC SODIUM 50 MG PO TBEC: 50.0000 mg | DELAYED_RELEASE_TABLET | Freq: Two times a day (BID) | ORAL | Status: DC

## 2015-06-24 NOTE — Telephone Encounter (Signed)
Rx sent to pharmacy   

## 2015-06-24 NOTE — Telephone Encounter (Signed)
[facility] - [place], [place] - [address] AT [address] TO [address]ED [address] [phone]  Requesting refill of diclofenac (VOLTAREN) 50 MG EC tablet

## 2015-07-21 ENCOUNTER — Inpatient Hospital Stay (HOSPITAL_COMMUNITY)
Admission: EM | Admit: 2015-07-21 | Discharge: 2015-07-26 | DRG: 312 | Disposition: A | Discharge status: Skilled Nursing Facility | Payer: Medicare Other | Attending: Internal Medicine | Admitting: Internal Medicine

## 2015-07-21 ENCOUNTER — Encounter (HOSPITAL_COMMUNITY): Payer: Self-pay | Admitting: *Deleted

## 2015-07-21 ENCOUNTER — Emergency Department (HOSPITAL_COMMUNITY): Payer: Medicare Other

## 2015-07-21 DIAGNOSIS — D649 Anemia, unspecified: Secondary | ICD-10-CM | POA: Diagnosis present

## 2015-07-21 DIAGNOSIS — G43A0 Cyclical vomiting, not intractable: Secondary | ICD-10-CM | POA: Diagnosis not present

## 2015-07-21 DIAGNOSIS — I10 Essential (primary) hypertension: Secondary | ICD-10-CM | POA: Diagnosis present

## 2015-07-21 DIAGNOSIS — R1013 Epigastric pain: Secondary | ICD-10-CM | POA: Diagnosis not present

## 2015-07-21 DIAGNOSIS — M6281 Muscle weakness (generalized): Secondary | ICD-10-CM | POA: Diagnosis not present

## 2015-07-21 DIAGNOSIS — Z79899 Other long term (current) drug therapy: Secondary | ICD-10-CM | POA: Diagnosis not present

## 2015-07-21 DIAGNOSIS — Z9849 Cataract extraction status, unspecified eye: Secondary | ICD-10-CM | POA: Diagnosis not present

## 2015-07-21 DIAGNOSIS — R112 Nausea with vomiting, unspecified: Secondary | ICD-10-CM | POA: Diagnosis present

## 2015-07-21 DIAGNOSIS — Z96659 Presence of unspecified artificial knee joint: Secondary | ICD-10-CM | POA: Diagnosis present

## 2015-07-21 DIAGNOSIS — I129 Hypertensive chronic kidney disease with stage 1 through stage 4 chronic kidney disease, or unspecified chronic kidney disease: Secondary | ICD-10-CM | POA: Diagnosis present

## 2015-07-21 DIAGNOSIS — K5289 Other specified noninfective gastroenteritis and colitis: Secondary | ICD-10-CM | POA: Diagnosis not present

## 2015-07-21 DIAGNOSIS — R1310 Dysphagia, unspecified: Secondary | ICD-10-CM | POA: Diagnosis not present

## 2015-07-21 DIAGNOSIS — Z7982 Long term (current) use of aspirin: Secondary | ICD-10-CM

## 2015-07-21 DIAGNOSIS — Z86718 Personal history of other venous thrombosis and embolism: Secondary | ICD-10-CM

## 2015-07-21 DIAGNOSIS — D6489 Other specified anemias: Secondary | ICD-10-CM

## 2015-07-21 DIAGNOSIS — N183 Chronic kidney disease, stage 3 unspecified: Secondary | ICD-10-CM

## 2015-07-21 DIAGNOSIS — R109 Unspecified abdominal pain: Secondary | ICD-10-CM | POA: Diagnosis not present

## 2015-07-21 DIAGNOSIS — R1319 Other dysphagia: Secondary | ICD-10-CM | POA: Diagnosis not present

## 2015-07-21 DIAGNOSIS — K529 Noninfective gastroenteritis and colitis, unspecified: Secondary | ICD-10-CM | POA: Diagnosis present

## 2015-07-21 DIAGNOSIS — R710 Precipitous drop in hematocrit: Secondary | ICD-10-CM | POA: Diagnosis present

## 2015-07-21 DIAGNOSIS — R140 Abdominal distension (gaseous): Secondary | ICD-10-CM | POA: Diagnosis not present

## 2015-07-21 DIAGNOSIS — R1084 Generalized abdominal pain: Secondary | ICD-10-CM | POA: Diagnosis not present

## 2015-07-21 DIAGNOSIS — R1314 Dysphagia, pharyngoesophageal phase: Secondary | ICD-10-CM | POA: Diagnosis not present

## 2015-07-21 DIAGNOSIS — M1990 Unspecified osteoarthritis, unspecified site: Secondary | ICD-10-CM | POA: Diagnosis present

## 2015-07-21 DIAGNOSIS — R1311 Dysphagia, oral phase: Secondary | ICD-10-CM | POA: Diagnosis not present

## 2015-07-21 DIAGNOSIS — E860 Dehydration: Secondary | ICD-10-CM | POA: Diagnosis present

## 2015-07-21 DIAGNOSIS — K219 Gastro-esophageal reflux disease without esophagitis: Secondary | ICD-10-CM | POA: Diagnosis not present

## 2015-07-21 DIAGNOSIS — R55 Syncope and collapse: Secondary | ICD-10-CM | POA: Diagnosis not present

## 2015-07-21 DIAGNOSIS — R011 Cardiac murmur, unspecified: Secondary | ICD-10-CM | POA: Diagnosis present

## 2015-07-21 DIAGNOSIS — N390 Urinary tract infection, site not specified: Secondary | ICD-10-CM | POA: Diagnosis present

## 2015-07-21 DIAGNOSIS — K2970 Gastritis, unspecified, without bleeding: Secondary | ICD-10-CM | POA: Diagnosis not present

## 2015-07-21 DIAGNOSIS — S72101A Unspecified trochanteric fracture of right femur, initial encounter for closed fracture: Secondary | ICD-10-CM | POA: Diagnosis not present

## 2015-07-21 DIAGNOSIS — R29898 Other symptoms and signs involving the musculoskeletal system: Secondary | ICD-10-CM | POA: Diagnosis not present

## 2015-07-21 LAB — COMPREHENSIVE METABOLIC PANEL
ALBUMIN: 3.6 g/dL (ref 3.5–5.0)
ALT: 16 U/L (ref 14–54)
ANION GAP: 10 (ref 5–15)
AST: 26 U/L (ref 15–41)
Alkaline Phosphatase: 118 U/L (ref 38–126)
BILIRUBIN TOTAL: 1 mg/dL (ref 0.3–1.2)
BUN: 30 mg/dL — ABNORMAL HIGH (ref 6–20)
CO2: 24 mmol/L (ref 22–32)
Calcium: 10 mg/dL (ref 8.9–10.3)
Chloride: 109 mmol/L (ref 101–111)
Creatinine, Ser: 1.34 mg/dL — ABNORMAL HIGH (ref 0.44–1.00)
GFR calc non Af Amer: 33 mL/min — ABNORMAL LOW (ref >60)
GFR, EST AFRICAN AMERICAN: 38 mL/min — AB (ref >60)
GLUCOSE: 111 mg/dL — AB (ref 65–99)
POTASSIUM: 4.8 mmol/L (ref 3.5–5.1)
Sodium: 143 mmol/L (ref 135–145)
TOTAL PROTEIN: 6.2 g/dL — AB (ref 6.5–8.1)

## 2015-07-21 LAB — URINALYSIS, ROUTINE W REFLEX MICROSCOPIC
BILIRUBIN URINE: NEGATIVE
Glucose, UA: NEGATIVE mg/dL
Hgb urine dipstick: NEGATIVE
KETONES UR: NEGATIVE mg/dL
NITRITE: POSITIVE — AB
PH: 5 (ref 5.0–8.0)
Protein, ur: NEGATIVE mg/dL
Specific Gravity, Urine: 1.012 (ref 1.005–1.030)
UROBILINOGEN UA: 0.2 mg/dL (ref 0.0–1.0)

## 2015-07-21 LAB — CBC
HCT: 40.4 % (ref 36.0–46.0)
Hemoglobin: 13.1 g/dL (ref 12.0–15.0)
MCH: 29.6 pg (ref 26.0–34.0)
MCHC: 32.4 g/dL (ref 30.0–36.0)
MCV: 91.4 fL (ref 78.0–100.0)
PLATELETS: 192 10*3/uL (ref 150–400)
RBC: 4.42 MIL/uL (ref 3.87–5.11)
RDW: 14.6 % (ref 11.5–15.5)
WBC: 9.8 10*3/uL (ref 4.0–10.5)

## 2015-07-21 LAB — LIPASE, BLOOD: LIPASE: 58 U/L — AB (ref 11–51)

## 2015-07-21 LAB — URINE MICROSCOPIC-ADD ON

## 2015-07-21 LAB — I-STAT TROPONIN, ED: TROPONIN I, POC: 0.01 ng/mL (ref 0.00–0.08)

## 2015-07-21 MED ORDER — DEXTROSE 5 % IV SOLN
2.0000 g | Freq: Once | INTRAVENOUS | Status: AC
  Administered 2015-07-21: 2 g via INTRAVENOUS
  Filled 2015-07-21: qty 2

## 2015-07-21 MED ORDER — SODIUM CHLORIDE 0.9 % IV BOLUS (SEPSIS)
500.0000 mL | Freq: Once | INTRAVENOUS | Status: AC
  Administered 2015-07-21: 500 mL via INTRAVENOUS

## 2015-07-21 MED ORDER — ACETAMINOPHEN 325 MG PO TABS
650.0000 mg | ORAL_TABLET | Freq: Once | ORAL | Status: AC
  Administered 2015-07-21: 650 mg via ORAL
  Filled 2015-07-21: qty 2

## 2015-07-21 NOTE — ED Provider Notes (Signed)
CSN: [id number]     Arrival date & time 07/21/15  1735 History   First MD Initiated Contact with Patient 07/21/15 1751     Chief Complaint  Patient presents with  . Abdominal Pain  . Emesis    HPI   Erin Gibson is a 81 y.o. female with a PMH of HTN, GERD, diverticulosis, abdominal hysterectomy, cholecystectomy who presents to the ED with abdominal pain, nausea, and vomiting. She states her abdomen has felt "tight" for the past week. She reports she woke up this morning and ate cereal, however began to experience abdominal pain after that time. She states she went to her hair appointment and voted, however when she came home, her pain worsened and she had a loose bowel movement and 6 episodes of emesis. She reports she felt dizzy and lightheaded, but that she made it to her bed and "passed out." She denies hematochezia, melena, or hematemesis. She denies fever, chills, headache. She reports she experienced chest pain and shortness of breath when her abdominal pain was "at its worst," but states this is now resolved. She denies dysuria, urgency, frequency, numbness, weakness, paresthesia.   Past Medical History  Diagnosis Date  . ANEMIA 03/31/2009  . DEGENERATIVE JOINT DISEASE, GENERALIZED 05/06/2007  . GERD 04/24/2007  . HYPERTENSION 04/24/2007  . VENOUS STASIS ULCER 07/20/2008  . DVT (deep venous thrombosis) (HCC)     hx of  . Chronic kidney disease   . Diverticulosis of sigmoid colon     Colonoscopy 2011   Past Surgical History  Procedure Laterality Date  . Cataract extraction    . Abdominal hysterectomy      Completion hysterectomy  . Total knee arthroplasty      x2  . Hernia repair    . Cholecystectomy open    . Partial hysterectomy      Salpingo-oophorectomy as well   No family history on file. Social History  Substance Use Topics  . Smoking status: Never Smoker   . Smokeless tobacco: Never Used  . Alcohol Use: No   OB History    No data available      Review of  Systems  Constitutional: Negative for fever and chills.  Respiratory: Positive for shortness of breath.   Cardiovascular: Positive for chest pain.  Gastrointestinal: Positive for nausea, vomiting, abdominal pain, diarrhea and abdominal distention. Negative for constipation and blood in stool.  Genitourinary: Negative for dysuria, urgency and frequency.  Neurological: Positive for dizziness, syncope and light-headedness. Negative for weakness, numbness and headaches.  All other systems reviewed and are negative.     Allergies  Codeine; Diltiazem hcl; Ibuprofen; and Tramadol  Home Medications   Prior to Admission medications   Medication Sig Start Date End Date Taking? Authorizing Provider  Artificial Tear GEL Place 2 drops into both eyes 2 (two) times daily.    Historical Provider, MD  aspirin EC 81 MG tablet Take 81 mg by mouth every morning.    Historical Provider, MD  Calcium Carbonate-Vitamin D (CALCIUM 600 + D PO) Take 1 tablet by mouth every morning.    Historical Provider, MD  Cyanocobalamin (VITAMIN B-12) 2500 MCG SUBL Place 1 tablet under the tongue every morning.    Historical Provider, MD  diclofenac (VOLTAREN) 50 MG EC tablet Take 1 tablet (50 mg total) by mouth 2 (two) times daily. 06/24/15   Gordy Savers, MD  furosemide (LASIX) 40 MG tablet TAKE 1/2 TABLET (=20MG )    DAILY 03/10/15   Theron Arista  Lysle Dingwall, MD  HYDROcodone-acetaminophen (NORCO/VICODIN) 5-325 MG per tablet Take 1 tablet by mouth once. 05/08/15   Laurence Spates, MD  meclizine (ANTIVERT) 25 MG tablet TAKE 1 TABLET 3 TIMES A DAYAS NEEDED FOR DIZZINESS 12/23/14   Gordy Savers, MD  nitroGLYCERIN (NITROSTAT) 0.4 MG SL tablet Place 0.4 mg under the tongue every 5 (five) minutes as needed for chest pain.    Historical Provider, MD  omeprazole (PRILOSEC) 40 MG capsule Take 1 capsule (40 mg total) by mouth daily. 03/10/15   Gordy Savers, MD  polyethylene glycol powder (GLYCOLAX/MIRALAX) powder MIX  17GM IN LIQUID AND     DRINK TWO TIMES A DAY 08/30/14   Gordy Savers, MD  potassium chloride SA (K-DUR,KLOR-CON) 20 MEQ tablet Take 1 tablet (20 mEq total) by mouth daily. 07/24/13   Gordy Savers, MD  quinapril (ACCUPRIL) 40 MG tablet Take 1 tablet (40 mg total) by mouth daily. 03/10/15   Gordy Savers, MD  vitamin E (VITAMIN E) 400 UNIT capsule Take 400 Units by mouth every morning.     Historical Provider, MD    Pulse 67  Temp(Src) 97.8 F (36.6 C) (Oral)  Resp 18  Ht  (1.499 m)  Wt 104 lb (47.174 kg)  BMI 20.99 kg/m2  SpO2 98% Physical Exam  Constitutional: She is oriented to person, place, and time. She appears well-developed and well-nourished. No distress.  HENT:  Head: Normocephalic and atraumatic.  Right Ear: External ear normal.  Left Ear: External ear normal.  Nose: Nose normal.  Mouth/Throat: Uvula is midline, oropharynx is clear and moist and mucous membranes are normal.  Eyes: Conjunctivae, EOM and lids are normal. Pupils are equal, round, and reactive to light. Right eye exhibits no discharge. Left eye exhibits no discharge. No scleral icterus.  Neck: Normal range of motion. Neck supple.  Cardiovascular: Normal rate, regular rhythm, normal heart sounds, intact distal pulses and normal pulses.   Pulmonary/Chest: Effort normal and breath sounds normal. No respiratory distress. She has no wheezes. She has no rales.  Abdominal: Soft. Normal appearance and bowel sounds are normal. She exhibits no distension and no mass. There is tenderness. There is guarding. There is no rigidity and no rebound.  TTP of periumbilical region with guarding.  Musculoskeletal: Normal range of motion. She exhibits no edema or tenderness.  Neurological: She is alert and oriented to person, place, and time. She has normal strength. No cranial nerve deficit or sensory deficit.  Skin: Skin is warm, dry and intact. No rash noted. She is not diaphoretic. No erythema. No pallor.   Psychiatric: She has a normal mood and affect. Her speech is normal and behavior is normal.  Nursing note and vitals reviewed.   ED Course  Procedures (including critical care time)  Labs Review Labs Reviewed  URINALYSIS, ROUTINE W REFLEX MICROSCOPIC (NOT AT [facility]) - Abnormal; Notable for the following:    APPearance CLOUDY (*)    Nitrite POSITIVE (*)    Leukocytes, UA SMALL (*)    All other components within normal limits  COMPREHENSIVE METABOLIC PANEL - Abnormal; Notable for the following:    Glucose, Bld 111 (*)    BUN 30 (*)    Creatinine, Ser 1.34 (*)    Total Protein 6.2 (*)    GFR calc non Af Amer 33 (*)    GFR calc Af Amer 38 (*)    All other components within normal limits  LIPASE, BLOOD - Abnormal; Notable for  the following:    Lipase 58 (*)    All other components within normal limits  URINE MICROSCOPIC-ADD ON - Abnormal; Notable for the following:    Bacteria, UA MANY (*)    All other components within normal limits  CBC  CBG MONITORING, ED  I-STAT TROPOININ, ED    Imaging Review Ct Abdomen Pelvis Wo Contrast  07/21/2015  CLINICAL DATA:  Acute right-sided abdominal pain. EXAM: CT ABDOMEN AND PELVIS WITHOUT CONTRAST TECHNIQUE: Multidetector CT imaging of the abdomen and pelvis was performed following the standard protocol without IV contrast. COMPARISON:  CT scan of March 09, 2013. FINDINGS: Multilevel degenerative disc disease is noted in the lumbar spine. 6 mm nodule is noted posteriorly in the right lower lobe. No gallstones are noted. No focal abnormality is noted in the liver, spleen or pancreas on these unenhanced images. Stable 5.4 cm calcified cyst is seen arising from upper pole of right kidney. Left kidney appears normal. Adrenal glands are not well visualized. Atherosclerosis of abdominal aorta is noted with 3.4 cm infrarenal abdominal aortic aneurysm. There is no evidence of bowel obstruction. No abnormal fluid collection is noted. Urinary bladder appears  normal. Mild left inguinal hernia is noted which contains a loop of bowel, but does not result in incarceration or obstruction. No significant adenopathy is noted. IMPRESSION: Stable calcified 5.4 cm cyst is seen arising from upper pole of right kidney. Calcified 3.4 cm infrarenal abdominal aortic aneurysm is noted. Recommend followup by ultrasound in 3 years. This recommendation follows ACR consensus guidelines: White Paper of the ACR Incidental Findings Committee II on Vascular Findings. J Am Coll Radiol 2013; 10:789-794. Stable mild left inguinal hernia is noted which contains a loop of bowel, but does not result in incarceration or obstruction. 6 mm nodule is noted in the right lower lobe. If the patient is at high risk for bronchogenic carcinoma, follow-up chest CT at 6-12 months is recommended. If the patient is at low risk for bronchogenic carcinoma, follow-up chest CT at 12 months is recommended. This recommendation follows the consensus statement: Guidelines for Management of Small Pulmonary Nodules Detected on CT Scans: A Statement from the Fleischner Society as published in Radiology 2005;237:395-400. Electronically Signed   By: Lupita Raider, M.D.   On: 07/21/2015 20:37   Dg Abd Acute W/chest  07/21/2015  CLINICAL DATA:  Mid abdominal pain. EXAM: DG ABDOMEN ACUTE W/ 1V CHEST COMPARISON:  Chest x-ray dated 05/08/2015 FINDINGS: Normal heart size and pulmonary vascularity. No focal airspace disease or consolidation in the lungs. No blunting of costophrenic angles. No pneumothorax. Mediastinal contours appear intact. Atherosclerotic disease and tortuosity of the aorta are seen. There is chronic elevation of the left hemidiaphragm. Scattered gas and stool in the colon. No small or large bowel distention. No free intra-abdominal air. No abnormal air-fluid levels. There is a suggestion of wall thickening of several small bowel loops within the lower abdomen. No radiopaque stones. There is a 5.7 cm  peripherally calcified structure, which in correlation to prior CT dated 03/09/2013 represents a renal lesion, mostly stable considering cross modality comparison. Visualized bones appear intact. IMPRESSION: No radiographic evidence of acute cardiopulmonary abnormality. Diffuse gaseous distension of the abdomen, without evidence of small-bowel obstruction. Probable small bowel wall thickening in the lower abdomen. 5.7 cm right renal lesion, mostly stable considering cross modality comparison. Electronically Signed   By: Ted Mcalpine M.D.   On: 07/21/2015 19:01     I have personally reviewed and evaluated these images  and lab results as part of my medical decision-making.   EKG Interpretation None      MDM   Final diagnoses:  Abdominal pain  Syncope, unspecified syncope type    81 year old female presents with abdominal pain, nausea, and vomiting. Reports loose bowel movement this afternoon and 6 episodes of emesis. States she felt dizzy and lightheaded, but that she made it to her bed and "passed out." Denies hematochezia, melena, or hematemesis. Denies fever, chills, headache, dysuria, urgency, frequency, numbness, weakness, paresthesia.   Patient is afebrile. Vital signs stable. Heart regular rate and rhythm. Lungs clear to auscultation bilaterally. Abdomen soft, nondistended, with mild tenderness to palpation in periumbilical region and voluntary guarding.   Patient given 500 mL bolus normal saline and tylenol.  CBC negative for leukocytosis or anemia. CMP remarkable for creatinine 1.34, which appears chronic. Lipase mildly elevated at 58. UA with positive nitrites, small leukocytes, 11-20 WBC on microscopic, consistent with UTI. EKG no acute ischemia. Troponin negative. CXR negative for acute cardiopulmonary abnormality. Plain film of abdomen remarkable for diffuse gaseous distention of the abdomen without evidence of obstruction, probable small bowel wall thickening in the lower  abdomen. CT abdomen pelvis obtained, which reveals stable right kidney cyst, 3.4 cm infrarenal abdominal aortic aneurysm, stable mild left inguinal hernia, and 6 mm nodule in right lower lobe of lung.   Patient given ceftriaxone. Hospitalist consulted for admission given reported syncopal event. Spoke with hospitalist, patient to be admitted for further evaluation and management.   BP 134/84 mmHg  Pulse 65  Temp(Src) 98.1 F (36.7 C) (Oral)  Resp 20  Ht 4\' 11"  (1.499 m)  Wt 104 lb (47.174 kg)  BMI 20.99 kg/m2  SpO2 93%    Mady Gemmalizabeth C Addiel Mccardle, PA-C 07/22/15 [id number]  Arby BarretteMarcy Pfeiffer, MD 07/26/15 [phone]

## 2015-07-21 NOTE — ED Notes (Signed)
[facility], per report the pt ate breakfast & had normal BM today & then felt dizzy & made it to the bed a laid down & had a syncopal episode lasting a few mins per pt, pt reports abd hardened for x 1 wk, pt c/o R abd pain with RLQ abd hardening, hx of bowel obstruction, pt reports x 10-15 vomiting episodes, pt then called EMS, A&O x4

## 2015-07-21 NOTE — ED Notes (Addendum)
Pt tried giving a urine sample, but had to defecate. Pt's feces were somewhat loose. Pt will try to give another urine sample later. Pt asked to be placed in a brief, due to urinating in her underwear and pajama bottoms. Pt's underwear and pajama bottoms were placed in a belongings bag. Pt was given blue paper scrub bottoms, also.

## 2015-07-22 ENCOUNTER — Other Ambulatory Visit (HOSPITAL_COMMUNITY): Payer: Federal, State, Local not specified - PPO

## 2015-07-22 ENCOUNTER — Encounter (HOSPITAL_COMMUNITY): Payer: Self-pay | Admitting: General Practice

## 2015-07-22 ENCOUNTER — Observation Stay (HOSPITAL_BASED_OUTPATIENT_CLINIC_OR_DEPARTMENT_OTHER): Payer: Medicare Other

## 2015-07-22 DIAGNOSIS — N390 Urinary tract infection, site not specified: Secondary | ICD-10-CM | POA: Diagnosis not present

## 2015-07-22 DIAGNOSIS — R109 Unspecified abdominal pain: Secondary | ICD-10-CM | POA: Diagnosis present

## 2015-07-22 DIAGNOSIS — R1310 Dysphagia, unspecified: Secondary | ICD-10-CM | POA: Diagnosis not present

## 2015-07-22 DIAGNOSIS — R1311 Dysphagia, oral phase: Secondary | ICD-10-CM | POA: Diagnosis not present

## 2015-07-22 DIAGNOSIS — G43A0 Cyclical vomiting, not intractable: Secondary | ICD-10-CM

## 2015-07-22 DIAGNOSIS — R112 Nausea with vomiting, unspecified: Secondary | ICD-10-CM | POA: Diagnosis not present

## 2015-07-22 DIAGNOSIS — R710 Precipitous drop in hematocrit: Secondary | ICD-10-CM | POA: Diagnosis present

## 2015-07-22 DIAGNOSIS — K529 Noninfective gastroenteritis and colitis, unspecified: Secondary | ICD-10-CM | POA: Diagnosis present

## 2015-07-22 DIAGNOSIS — Z79899 Other long term (current) drug therapy: Secondary | ICD-10-CM | POA: Diagnosis not present

## 2015-07-22 DIAGNOSIS — N183 Chronic kidney disease, stage 3 (moderate): Secondary | ICD-10-CM | POA: Diagnosis not present

## 2015-07-22 DIAGNOSIS — R1319 Other dysphagia: Secondary | ICD-10-CM | POA: Diagnosis not present

## 2015-07-22 DIAGNOSIS — R55 Syncope and collapse: Secondary | ICD-10-CM

## 2015-07-22 DIAGNOSIS — M6281 Muscle weakness (generalized): Secondary | ICD-10-CM | POA: Diagnosis not present

## 2015-07-22 DIAGNOSIS — R011 Cardiac murmur, unspecified: Secondary | ICD-10-CM | POA: Diagnosis present

## 2015-07-22 DIAGNOSIS — D649 Anemia, unspecified: Secondary | ICD-10-CM | POA: Diagnosis present

## 2015-07-22 DIAGNOSIS — R1013 Epigastric pain: Secondary | ICD-10-CM | POA: Diagnosis not present

## 2015-07-22 DIAGNOSIS — I129 Hypertensive chronic kidney disease with stage 1 through stage 4 chronic kidney disease, or unspecified chronic kidney disease: Secondary | ICD-10-CM | POA: Diagnosis present

## 2015-07-22 DIAGNOSIS — R1314 Dysphagia, pharyngoesophageal phase: Secondary | ICD-10-CM | POA: Diagnosis not present

## 2015-07-22 DIAGNOSIS — K5289 Other specified noninfective gastroenteritis and colitis: Secondary | ICD-10-CM | POA: Diagnosis not present

## 2015-07-22 DIAGNOSIS — M1990 Unspecified osteoarthritis, unspecified site: Secondary | ICD-10-CM | POA: Diagnosis present

## 2015-07-22 DIAGNOSIS — E860 Dehydration: Secondary | ICD-10-CM | POA: Diagnosis present

## 2015-07-22 DIAGNOSIS — Z9849 Cataract extraction status, unspecified eye: Secondary | ICD-10-CM | POA: Diagnosis not present

## 2015-07-22 DIAGNOSIS — Z86718 Personal history of other venous thrombosis and embolism: Secondary | ICD-10-CM | POA: Diagnosis not present

## 2015-07-22 DIAGNOSIS — Z96659 Presence of unspecified artificial knee joint: Secondary | ICD-10-CM | POA: Diagnosis present

## 2015-07-22 DIAGNOSIS — K219 Gastro-esophageal reflux disease without esophagitis: Secondary | ICD-10-CM | POA: Diagnosis present

## 2015-07-22 DIAGNOSIS — R29898 Other symptoms and signs involving the musculoskeletal system: Secondary | ICD-10-CM | POA: Diagnosis not present

## 2015-07-22 DIAGNOSIS — I10 Essential (primary) hypertension: Secondary | ICD-10-CM | POA: Diagnosis not present

## 2015-07-22 DIAGNOSIS — Z7982 Long term (current) use of aspirin: Secondary | ICD-10-CM | POA: Diagnosis not present

## 2015-07-22 DIAGNOSIS — S72101A Unspecified trochanteric fracture of right femur, initial encounter for closed fracture: Secondary | ICD-10-CM | POA: Diagnosis not present

## 2015-07-22 LAB — VITAMIN B12: Vitamin B-12: 255 pg/mL (ref 180–914)

## 2015-07-22 LAB — IRON AND TIBC
Iron: 85 ug/dL (ref 28–170)
Saturation Ratios: 30 % (ref 10.4–31.8)
TIBC: 280 ug/dL (ref 250–450)
UIBC: 195 ug/dL

## 2015-07-22 LAB — CBC
HEMATOCRIT: 29.6 % — AB (ref 36.0–46.0)
HEMOGLOBIN: 9.8 g/dL — AB (ref 12.0–15.0)
MCH: 30.1 pg (ref 26.0–34.0)
MCHC: 33.1 g/dL (ref 30.0–36.0)
MCV: 90.8 fL (ref 78.0–100.0)
PLATELETS: 160 10*3/uL (ref 150–400)
RBC: 3.26 MIL/uL — AB (ref 3.87–5.11)
RDW: 14.8 % (ref 11.5–15.5)
WBC: 7.1 10*3/uL (ref 4.0–10.5)

## 2015-07-22 LAB — RETICULOCYTES
RBC.: 3.59 MIL/uL — ABNORMAL LOW (ref 3.87–5.11)
RETIC CT PCT: 0.9 % (ref 0.4–3.1)
Retic Count, Absolute: 32.3 10*3/uL (ref 19.0–186.0)

## 2015-07-22 LAB — COMPREHENSIVE METABOLIC PANEL
ALT: 13 U/L — AB (ref 14–54)
AST: 20 U/L (ref 15–41)
Albumin: 2.5 g/dL — ABNORMAL LOW (ref 3.5–5.0)
Alkaline Phosphatase: 93 U/L (ref 38–126)
Anion gap: 9 (ref 5–15)
BUN: 28 mg/dL — ABNORMAL HIGH (ref 6–20)
CHLORIDE: 110 mmol/L (ref 101–111)
CO2: 24 mmol/L (ref 22–32)
CREATININE: 1.2 mg/dL — AB (ref 0.44–1.00)
Calcium: 8.9 mg/dL (ref 8.9–10.3)
GFR, EST AFRICAN AMERICAN: 43 mL/min — AB (ref >60)
GFR, EST NON AFRICAN AMERICAN: 37 mL/min — AB (ref >60)
Glucose, Bld: 89 mg/dL (ref 65–99)
POTASSIUM: 4.2 mmol/L (ref 3.5–5.1)
SODIUM: 143 mmol/L (ref 135–145)
Total Bilirubin: 0.6 mg/dL (ref 0.3–1.2)
Total Protein: 4.6 g/dL — ABNORMAL LOW (ref 6.5–8.1)

## 2015-07-22 LAB — CBC WITH DIFFERENTIAL/PLATELET
Basophils Absolute: 0 10*3/uL (ref 0.0–0.1)
Basophils Relative: 0 %
EOS ABS: 0.2 10*3/uL (ref 0.0–0.7)
Eosinophils Relative: 3 %
HEMATOCRIT: 33.3 % — AB (ref 36.0–46.0)
HEMOGLOBIN: 10.8 g/dL — AB (ref 12.0–15.0)
LYMPHS ABS: 1.4 10*3/uL (ref 0.7–4.0)
LYMPHS PCT: 21 %
MCH: 30.1 pg (ref 26.0–34.0)
MCHC: 32.4 g/dL (ref 30.0–36.0)
MCV: 92.8 fL (ref 78.0–100.0)
MONOS PCT: 7 %
Monocytes Absolute: 0.4 10*3/uL (ref 0.1–1.0)
NEUTROS ABS: 4.5 10*3/uL (ref 1.7–7.7)
NEUTROS PCT: 69 %
Platelets: 156 10*3/uL (ref 150–400)
RBC: 3.59 MIL/uL — AB (ref 3.87–5.11)
RDW: 14.9 % (ref 11.5–15.5)
WBC: 6.5 10*3/uL (ref 4.0–10.5)

## 2015-07-22 LAB — TYPE AND SCREEN
ABO/RH(D): A POS
ANTIBODY SCREEN: NEGATIVE

## 2015-07-22 LAB — GLUCOSE, CAPILLARY: GLUCOSE-CAPILLARY: 77 mg/dL (ref 65–99)

## 2015-07-22 LAB — PROTIME-INR
INR: 1.36 (ref 0.00–1.49)
Prothrombin Time: 16.9 seconds — ABNORMAL HIGH (ref 11.6–15.2)

## 2015-07-22 LAB — FERRITIN: FERRITIN: 23 ng/mL (ref 11–307)

## 2015-07-22 LAB — ABO/RH: ABO/RH(D): A POS

## 2015-07-22 MED ORDER — QUINAPRIL HCL 10 MG PO TABS
40.0000 mg | ORAL_TABLET | Freq: Every day | ORAL | Status: DC
  Administered 2015-07-22 – 2015-07-26 (×5): 40 mg via ORAL
  Filled 2015-07-22 (×6): qty 4

## 2015-07-22 MED ORDER — HEPARIN SODIUM (PORCINE) 5000 UNIT/ML IJ SOLN
5000.0000 [IU] | Freq: Three times a day (TID) | INTRAMUSCULAR | Status: DC
  Administered 2015-07-22 – 2015-07-26 (×15): 5000 [IU] via SUBCUTANEOUS
  Filled 2015-07-22 (×15): qty 1

## 2015-07-22 MED ORDER — ACETAMINOPHEN 650 MG RE SUPP
650.0000 mg | Freq: Four times a day (QID) | RECTAL | Status: DC | PRN
Start: 2015-07-22 — End: 2015-07-26

## 2015-07-22 MED ORDER — PANTOPRAZOLE SODIUM 40 MG PO TBEC
40.0000 mg | DELAYED_RELEASE_TABLET | Freq: Every day | ORAL | Status: DC
  Administered 2015-07-22 – 2015-07-24 (×3): 40 mg via ORAL
  Filled 2015-07-22 (×4): qty 1

## 2015-07-22 MED ORDER — VITAMIN B-12 1000 MCG PO TABS
500.0000 ug | ORAL_TABLET | Freq: Every day | ORAL | Status: DC
  Administered 2015-07-23 – 2015-07-26 (×4): 500 ug via ORAL
  Filled 2015-07-22 (×4): qty 1

## 2015-07-22 MED ORDER — ACETAMINOPHEN 325 MG PO TABS
650.0000 mg | ORAL_TABLET | Freq: Four times a day (QID) | ORAL | Status: DC | PRN
  Administered 2015-07-25 – 2015-07-26 (×3): 650 mg via ORAL
  Filled 2015-07-22 (×3): qty 2

## 2015-07-22 MED ORDER — MECLIZINE HCL 25 MG PO TABS: 25.0000 mg | ORAL_TABLET | Freq: Three times a day (TID) | ORAL | Status: DC | PRN

## 2015-07-22 MED ORDER — SODIUM CHLORIDE 0.9 % IJ SOLN
3.0000 mL | Freq: Two times a day (BID) | INTRAMUSCULAR | Status: DC
  Administered 2015-07-22 – 2015-07-25 (×7): 3 mL via INTRAVENOUS

## 2015-07-22 MED ORDER — ASPIRIN EC 81 MG PO TBEC
81.0000 mg | DELAYED_RELEASE_TABLET | Freq: Every morning | ORAL | Status: DC
  Administered 2015-07-22 – 2015-07-26 (×5): 81 mg via ORAL
  Filled 2015-07-22 (×5): qty 1

## 2015-07-22 MED ORDER — ONDANSETRON HCL 4 MG PO TABS: 4.0000 mg | ORAL_TABLET | Freq: Four times a day (QID) | ORAL | Status: DC | PRN

## 2015-07-22 MED ORDER — SODIUM CHLORIDE 0.9 % IV SOLN
INTRAVENOUS | Status: DC
  Administered 2015-07-22 – 2015-07-26 (×3): via INTRAVENOUS

## 2015-07-22 MED ORDER — ONDANSETRON HCL 4 MG/2ML IJ SOLN
4.0000 mg | Freq: Four times a day (QID) | INTRAMUSCULAR | Status: DC | PRN
Start: 2015-07-22 — End: 2015-07-26
  Administered 2015-07-25: 4 mg via INTRAVENOUS
  Filled 2015-07-22: qty 2

## 2015-07-22 MED ORDER — DEXTROSE 5 % IV SOLN
1.0000 g | INTRAVENOUS | Status: DC
  Administered 2015-07-22 – 2015-07-26 (×5): 1 g via INTRAVENOUS
  Filled 2015-07-22 (×6): qty 10

## 2015-07-22 NOTE — Evaluation (Signed)
Physical Therapy Evaluation Patient Details Name: Erin Gibson MRN: [id number] DOB: 03/17/1919 Today's Date: 07/22/2015   History of Present Illness  Pt is a 81 y.o. F with PMH of anemia, GERD, hypertension, chronic kidney disease.  She is presenting with complaints of an abdominal pain as well as nausea and vomiting as well as passing out episode  Clinical Impression  Pt presents with impairments as indicated below.  Pt would benefit from further acute skilled PT services to maximize LE strength, safety and independence with functional mobility, and to improve activity tolerance.  PT currently recommends SNF following discharge.  This has been discussed with the patient and her sister and they agree that [facility] would be the preferred SNF if this is to be the discharge plan as pt has had positive prior experiences there.  Will continue to follow.    Follow Up Recommendations SNF;Supervision for mobility/OOB    Equipment Recommendations  None recommended by PT    Recommendations for Other Services OT consult     Precautions / Restrictions Precautions Precautions: Fall Restrictions Weight Bearing Restrictions: No      Mobility  Bed Mobility Overal bed mobility: Needs Assistance Bed Mobility: Supine to Sit;Sit to Supine     Supine to sit: Mod assist (for trunk support and to scoot to EOB) Sit to supine: Mod assist (LE [facility] and [facility])      Transfers Overall transfer level: Needs assistance   Transfers: Sit to/from Stand Sit to Stand: Mod assist         General transfer comment: Performed partial stand x2 with pt unable to reach fully erect posture due to weakness and posterior lean.  VCs for hand placement and to lean forward.  Pt with increased dyspnea with this and needed to take a seated rest break between trials.  HR and O2 sats stable on RA.  Ambulation/Gait                Stairs            Wheelchair Mobility    Modified Rankin  (Stroke Patients Only)       Balance Overall balance assessment: Needs assistance   Sitting balance-Leahy Scale: Fair       Standing balance-Leahy Scale: Poor Standing balance comment: posterior lean in standing                             Pertinent Vitals/Pain Pain Assessment: Faces Faces Pain Scale: Hurts little more Pain Location: "achy all over" Pain Descriptors / Indicators: Aching;Grimacing Pain Intervention(s): Monitored during session    Home Living Family/patient expects to be discharged to:: Private residence Living Arrangements: Alone Available Help at Discharge: Neighbor;Family;Available PRN/intermittently Type of Home: House Home Access: Level entry     Home Layout: One level (with 1 step) Home Equipment: Walker - 2 wheels      Prior Function Level of Independence: Independent with assistive device(s)         Comments: Independent with RW     Hand Dominance        Extremity/Trunk Assessment   Upper Extremity Assessment: Defer to OT evaluation           Lower Extremity Assessment: Generalized weakness         Communication   Communication: No difficulties  Cognition Arousal/Alertness: Awake/alert Behavior During Therapy: WFL for tasks assessed/performed Overall Cognitive Status: Within Functional Limits for tasks assessed  General Comments General comments (skin integrity, edema, etc.): Pt with good motivation to participate with PT though she became discouraged that she was unable to stand like she usually can.  Pts daughter and friends present during session and are supportive.  Daughter lives in [place], [place] but notes that pts neighbors are very helpful.    Exercises        Assessment/Plan    PT Assessment Patient needs continued PT services  PT Diagnosis Difficulty walking;Generalized weakness   PT Problem List Decreased strength;Decreased activity tolerance;Decreased  balance;Decreased mobility;Decreased knowledge of use of DME;Decreased safety awareness;Cardiopulmonary status limiting activity  PT Treatment Interventions DME instruction;Gait training;Stair training;Functional mobility training;Therapeutic activities;Therapeutic exercise;Balance training;Patient/family education   PT Goals (Current goals can be found in the Care Plan section) Acute Rehab PT Goals Patient Stated Goal: none stated PT Goal Formulation: With patient/family Time For Goal Achievement: 08/05/15 Potential to Achieve Goals: Good    Frequency Min 2X/week   Barriers to discharge Decreased caregiver support      Co-evaluation               End of Session Equipment Utilized During Treatment: Gait belt Activity Tolerance: Patient tolerated treatment well Patient left: in bed;with call bell/phone within reach;with bed alarm set;with family/visitor present Nurse Communication: Mobility status         Time: 1425-1447 PT Time Calculation (min) (ACUTE ONLY): 22 min   Charges:   PT Evaluation $Initial PT Evaluation Tier I: 1 Procedure     PT G CodesMarchelle Folks Idrees Quam 2015-08-19, 3:08 PM Arnoldo Morale, SPT

## 2015-07-22 NOTE — Clinical Social Work Note (Signed)
Clinical Social Work Assessment  Patient Details  Name: Erin Gibson MRN: [id number] Date of Birth: 05/20/1919  Date of referral:  07/22/15               Reason for consult:  Facility Placement                Permission sought to share information with:  Oceanographeracility Contact Representative Permission granted to share information::  Yes, Verbal Permission Granted  Name::        Agency::  [facility]  Relationship::     Contact Information:     Housing/Transportation Living arrangements for the past 2 months:  Skilled Nursing Facility Source of Information:  Patient Patient Interpreter Needed:  None Criminal Activity/Legal Involvement Pertinent to Current Situation/Hospitalization:    Significant Relationships:  Adult Children Lives with:  Self Do you feel safe going back to the place where you live?  Yes Need for family participation in patient care:  No (Coment)  Care giving concerns:  Pt lives alone- currently having recurrent falls   Office managerocial Worker assessment / plan:  CSW spoke with pt about PT recommendation for SNF  Employment status:  Retired Health and safety inspectornsurance information:  Medicare PT Recommendations:  Skilled Nursing Facility Information / Referral to community resources:  Skilled Nursing Facility  Patient/Family's Response to care:  Pt agreeable- has been to [facility] in the past- would like to return.  CSW explained referral process and need for pt to have 3 inpatient nights to qualify for SNF  Patient/Family's Understanding of and Emotional Response to Diagnosis, Current Treatment, and Prognosis:  Pt seems to have clear understanding- no questions or concerns- hopeful she can return to living independently soon  Emotional Assessment Appearance:    Attitude/Demeanor/Rapport:    Affect (typically observed):  Appropriate, Pleasant Orientation:  Oriented to Self, Oriented to Place, Oriented to  Time, Oriented to Situation Alcohol / Substance use:  Not Applicable Psych involvement  (Current and /or in the community):  No (Comment)  Discharge Needs  Concerns to be addressed:  Care Coordination Readmission within the last 30 days:  No Current discharge risk:  Lives alone, Physical Impairment Barriers to Discharge:  Continued Medical Work up   Peabody EnergyHoloman, Emeka Lindner M, LCSW 07/22/2015, 4:59 PM

## 2015-07-22 NOTE — [place] FL2 (Signed)
[place] MEDICAID FL2 LEVEL OF CARE SCREENING TOOL     IDENTIFICATION  Patient Name: Erin Gibson Birthdate: 01/21/1919 Sex: female Admission Date (Current Location): 07/21/2015  [facility] [place]   Facility and Address:  [facility], [address], [place], [place] [address]      Provider Number: [id number]  Attending Physician Name and Address:  Catarina Hartshornavid Tat, MD  Relative Name and Phone Number:       Current Level of Care: Hospital Recommended Level of Care: Skilled Nursing Facility Prior Approval Number:    Date Approved/Denied:   PASRR Number: [id number]  Discharge Plan: SNF    Current Diagnoses: Patient Active Problem List   Diagnosis Date Noted  . UTI (urinary tract infection) 07/22/2015  . Abdominal pain 07/22/2015  . Nausea with vomiting 07/22/2015  . Syncope 07/21/2015  . Abnormality of gait 01/22/2014  . Partial small bowel obstruction (HCC) 03/09/2013  . Inguinal hernia, left - Contains sigmoid colon.  Reducible 03/09/2013  . Anemia 03/31/2009  . VENOUS STASIS ULCER 07/20/2008  . Osteoarthritis 05/06/2007  . Essential hypertension 04/24/2007  . GERD 04/24/2007    Orientation ACTIVITIES/SOCIAL BLADDER RESPIRATION     (AOx4)  Active Incontinent Normal  BEHAVIORAL SYMPTOMS/MOOD NEUROLOGICAL BOWEL NUTRITION STATUS      Continent Diet  PHYSICIAN VISITS COMMUNICATION OF NEEDS Height & Weight Skin    Verbally   111 lbs. Normal          AMBULATORY STATUS RESPIRATION    Assist extensive Normal      Personal Care Assistance Level of Assistance  Bathing, Dressing Bathing Assistance: Limited assistance   Dressing Assistance: Limited assistance      Functional Limitations Info                SPECIAL CARE FACTORS FREQUENCY  PT (By licensed PT)     PT Frequency: 5/wk             Additional Factors Info  Code Status, Allergies Code Status Info: DNR             Current Medications  (07/22/2015): Current Facility-Administered Medications  Medication Dose Route Frequency Provider Last Rate Last Dose  . 0.9 %  sodium chloride infusion   Intravenous Continuous Rolly SalterPranav M Patel, MD 75 mL/hr at 07/22/15 1700    . acetaminophen (TYLENOL) tablet 650 mg  650 mg Oral Q6H PRN Rolly SalterPranav M Patel, MD       Or  . acetaminophen (TYLENOL) suppository 650 mg  650 mg Rectal Q6H PRN Rolly SalterPranav M Patel, MD      . aspirin EC tablet 81 mg  81 mg Oral q morning - 10a Rolly SalterPranav M Patel, MD   81 mg at 07/22/15 0940  . cefTRIAXone (ROCEPHIN) 1 g in dextrose 5 % 50 mL IVPB  1 g Intravenous Q24H Rolly SalterPranav M Patel, MD      . heparin injection 5,000 Units  5,000 Units Subcutaneous 3 times per day Rolly SalterPranav M Patel, MD   5,000 Units at 07/22/15 1321  . meclizine (ANTIVERT) tablet 25 mg  25 mg Oral TID PRN Rolly SalterPranav M Patel, MD      . ondansetron [facility](ZOFRAN) tablet 4 mg  4 mg Oral Q6H PRN Rolly SalterPranav M Patel, MD       Or  . ondansetron [facility](ZOFRAN) injection 4 mg  4 mg Intravenous Q6H PRN Rolly SalterPranav M Patel, MD      . pantoprazole (PROTONIX) EC tablet 40 mg  40 mg Oral Daily Rolly Salter, MD   40 mg at 07/22/15 0941  . quinapril (ACCUPRIL) tablet 40 mg  40 mg Oral Daily Rolly Salter, MD   40 mg at 07/22/15 0940  . sodium chloride 0.9 % injection 3 mL  3 mL Intravenous Q12H Rolly Salter, MD   3 mL at 07/22/15 0941   Do not use this list as official medication orders. Please verify with discharge summary.  Discharge Medications:   Medication List    ASK your doctor about these medications        Artificial Tear Gel  Place 2 drops into both eyes 2 (two) times daily.     aspirin EC 81 MG tablet  Take 81 mg by mouth every morning.     CALCIUM 600 + D PO  Take 1 tablet by mouth every morning.     diclofenac 50 MG EC tablet  Commonly known as:  VOLTAREN  Take 1 tablet (50 mg total) by mouth 2 (two) times daily.     furosemide 40 MG tablet  Commonly known as:  LASIX  TAKE 1/2 TABLET (=20MG )    DAILY     meclizine 25 MG  tablet  Commonly known as:  ANTIVERT  TAKE 1 TABLET 3 TIMES A DAYAS NEEDED FOR DIZZINESS     nitroGLYCERIN 0.4 MG SL tablet  Commonly known as:  NITROSTAT  Place 0.4 mg under the tongue every 5 (five) minutes as needed for chest pain.     omeprazole 40 MG capsule  Commonly known as:  PRILOSEC  Take 1 capsule (40 mg total) by mouth daily.     polyethylene glycol powder powder  Commonly known as:  GLYCOLAX/MIRALAX  MIX 17GM IN LIQUID AND     DRINK TWO TIMES A DAY     potassium chloride SA 20 MEQ tablet  Commonly known as:  K-DUR,KLOR-CON  Take 1 tablet (20 mEq total) by mouth daily.     quinapril 40 MG tablet  Commonly known as:  ACCUPRIL  Take 1 tablet (40 mg total) by mouth daily.     Vitamin B-12 2500 MCG Subl  Place 1 tablet under the tongue every morning.     vitamin E 400 UNIT capsule  Generic drug:  vitamin E  Take 400 Units by mouth every morning.        Relevant Imaging Results:  Relevant Lab Results:  Recent Labs    Additional Information    Izora Ribas, LCSW

## 2015-07-22 NOTE — Progress Notes (Signed)
  Echocardiogram 2D Echocardiogram has been performed.  [name], [name] M 07/22/2015, 11:34 AM

## 2015-07-22 NOTE — Progress Notes (Signed)
PROGRESS NOTE  Erin Gibson ZOX:[id number] DOB: 1919-06-30 [name]: 07/21/2015 PCP: Rogelia Boga, MD  Brief History 81 year old female with a history of hypertension, CKD sensory, normocytic anemia presented with 1 day history of nausea, vomiting, abdominal pain with resultant syncope. The patient states that on 07/21/2015 she developed epigastric and periumbilical abdominal pain that began that morning. She had subsequently 6 episodes of emesis without any blood. On the evening of 07/21/2015, the patient was going back to the bedroom after episode of emesis when she began feeling dizzy and subsequently had a syncopal episode falling onto her bed. When she woke up on her bed, EMS was activated. The patient denies any head injuries. However, she complains of some chest discomfort without any shortness of breath. The patient has not had any vomiting since arrival to the hospital. Her abdominal pain is somewhat better. She also complains of some dysuria which is been going on for at least the past month. The patient lives by herself at home and uses a walker for ambulation. She denies any recent travels or eating any raw or undercooked food. Assessment/Plan: Syncope -Vasovagal in etiology -Remain on telemetry -Continue IV fluids -Orthostatic vital signs negative Acute gastroenteritis -Seems to be improving--no further vomiting since admission -07/21/2015 CT abdomen and pelvis negative for bowel obstruction. No mention of bowel wall thickening. No hydronephrosis. -Start with clear liquids and advance as tolerated -The patient's urinary tract infection may also be contributing -Continue intravenous fluids -Continue PPI for now Pyuria -Concern for UTI -Continue ceftriaxone pending culture data Hypertension  -well controlled on quinapril  CKD stage III  -baseline creatinine 1.0-1.3 Normocytic anemia  -Baseline hemoglobin of 11-12  -Drop in hemoglobin likely dilution  -check  iron, B12, RBC folate Right lung nodule  -Incidental finding on CT  -Outpatient surveillance  Deconditioning  -PT evaluation  -Suspect patient will need skilled nursing facility    Family Communication:   Pt at beside Disposition Plan:  SNF in 1-2 days     Procedures/Studies: Ct Abdomen Pelvis Wo Contrast  07/21/2015  CLINICAL DATA:  Acute right-sided abdominal pain. EXAM: CT ABDOMEN AND PELVIS WITHOUT CONTRAST TECHNIQUE: Multidetector CT imaging of the abdomen and pelvis was performed following the standard protocol without IV contrast. COMPARISON:  CT scan of March 09, 2013. FINDINGS: Multilevel degenerative disc disease is noted in the lumbar spine. 6 mm nodule is noted posteriorly in the right lower lobe. No gallstones are noted. No focal abnormality is noted in the liver, spleen or pancreas on these unenhanced images. Stable 5.4 cm calcified cyst is seen arising from upper pole of right kidney. Left kidney appears normal. Adrenal glands are not well visualized. Atherosclerosis of abdominal aorta is noted with 3.4 cm infrarenal abdominal aortic aneurysm. There is no evidence of bowel obstruction. No abnormal fluid collection is noted. Urinary bladder appears normal. Mild left inguinal hernia is noted which contains a loop of bowel, but does not result in incarceration or obstruction. No significant adenopathy is noted. IMPRESSION: Stable calcified 5.4 cm cyst is seen arising from upper pole of right kidney. Calcified 3.4 cm infrarenal abdominal aortic aneurysm is noted. Recommend followup by ultrasound in 3 years. This recommendation follows ACR consensus guidelines: White Paper of the ACR Incidental Findings Committee II on Vascular Findings. J Am Coll Radiol 2013; 10:789-794. Stable mild left inguinal hernia is noted which contains a loop of bowel, but does not result in incarceration or obstruction. 6 mm nodule  is noted in the right lower lobe. If the patient is at high risk for bronchogenic  carcinoma, follow-up chest CT at 6-12 months is recommended. If the patient is at low risk for bronchogenic carcinoma, follow-up chest CT at 12 months is recommended. This recommendation follows the consensus statement: Guidelines for Management of Small Pulmonary Nodules Detected on CT Scans: A Statement from the Fleischner Society as published in Radiology 2005;237:395-400. Electronically Signed   By: Lupita RaiderJames  Green Jr, M.D.   On: 07/21/2015 20:37   Dg Abd Acute W/chest  07/21/2015  CLINICAL DATA:  Mid abdominal pain. EXAM: DG ABDOMEN ACUTE W/ 1V CHEST COMPARISON:  Chest x-ray dated 05/08/2015 FINDINGS: Normal heart size and pulmonary vascularity. No focal airspace disease or consolidation in the lungs. No blunting of costophrenic angles. No pneumothorax. Mediastinal contours appear intact. Atherosclerotic disease and tortuosity of the aorta are seen. There is chronic elevation of the left hemidiaphragm. Scattered gas and stool in the colon. No small or large bowel distention. No free intra-abdominal air. No abnormal air-fluid levels. There is a suggestion of wall thickening of several small bowel loops within the lower abdomen. No radiopaque stones. There is a 5.7 cm peripherally calcified structure, which in correlation to prior CT dated 03/09/2013 represents a renal lesion, mostly stable considering cross modality comparison. Visualized bones appear intact. IMPRESSION: No radiographic evidence of acute cardiopulmonary abnormality. Diffuse gaseous distension of the abdomen, without evidence of small-bowel obstruction. Probable small bowel wall thickening in the lower abdomen. 5.7 cm right renal lesion, mostly stable considering cross modality comparison. Electronically Signed   By: Ted Mcalpineobrinka  Dimitrova M.D.   On: 07/21/2015 19:01         Subjective:  patient still complains of abdominal pain although somewhat better than the past 24-48 hours. No further vomiting. Complains of some chest discomfort  without shortness of breath. No dizziness, hematemesis, hematochezia, melena. Complains of dysuria.   Objective: Filed Vitals:   07/22/15 0000 07/22/15 0125 07/22/15 0611 07/22/15 0832  BP: 134/84 148/44  140/42  Pulse: 65 55  70  Temp:  98.9 F (37.2 C) 98.7 F (37.1 C) 98.1 F (36.7 C)  TempSrc:  Oral Oral Oral  Resp: 20 16 18    Height:  4\' 11"  (1.499 m)    Weight:  50.485 kg (111 lb 4.8 oz)    SpO2: 93% 99% 96% 97%    Intake/Output Summary (Last 24 hours) at 07/22/15 0920 Last data filed at 07/22/15 0903  Gross per 24 hour  Intake 798.75 ml  Output    300 ml  Net 498.75 ml   Weight change:  Exam:   General:  Pt is alert, follows commands appropriately, not in acute distress  HEENT: No icterus, No thrush, No neck mass, [place]/AT  Cardiovascular: RRR, S1/S2, no rubs, no gallops  Respiratory: bibasilar crackles. No wheeze   Abdomen: Soft/+BS, periumbilical tenderness without any guarding or rebound, non distended, no guarding  Extremities: No edema, No lymphangitis, No petechiae, No rashes, no synovitis  Data Reviewed: Basic Metabolic Panel:  Recent Labs Lab 07/21/15 1835 07/22/15 0342  NA 143 143  K 4.8 4.2  CL 109 110  CO2 24 24  GLUCOSE 111* 89  BUN 30* 28*  CREATININE 1.34* 1.20*  CALCIUM 10.0 8.9   Liver Function Tests:  Recent Labs Lab 07/21/15 1835 07/22/15 0342  AST 26 20  ALT 16 13*  ALKPHOS 118 93  BILITOT 1.0 0.6  PROT 6.2* 4.6*  ALBUMIN 3.6 2.5*  Recent Labs Lab 07/21/15 1835  LIPASE 58*   No results for input(s): AMMONIA in the last 168 hours. CBC:  Recent Labs Lab 07/21/15 1835 07/22/15 0342 07/22/15 0645  WBC 9.8 7.1 6.5  NEUTROABS  --   --  4.5  HGB 13.1 9.8* 10.8*  HCT 40.4 29.6* 33.3*  MCV 91.4 90.8 92.8  PLT 192 160 156   Cardiac Enzymes: No results for input(s): CKTOTAL, CKMB, CKMBINDEX, TROPONINI in the last 168 hours. BNP: Invalid input(s): POCBNP CBG:  Recent Labs Lab 07/22/15 0601  GLUCAP 77     No results found for this or any previous visit (from the past 240 hour(s)).   Scheduled Meds: . aspirin EC  81 mg Oral q morning - 10a  . cefTRIAXone (ROCEPHIN)  IV  1 g Intravenous Q24H  . heparin  5,000 Units Subcutaneous 3 times per day  . pantoprazole  40 mg Oral Daily  . quinapril  40 mg Oral Daily  . sodium chloride  3 mL Intravenous Q12H   Continuous Infusions: . sodium chloride 75 mL/hr at 07/22/15 0145     Wrenna Saks, DO  [facility] Pager [phone]  If 7PM-7AM, please contact night-coverage [email or web address] Password [email or web address] 07/22/2015, 9:20 AM

## 2015-07-22 NOTE — H&P (Signed)
[facility] History and Physical  Patient: Erin Gibson  MRN: [id number]  DOB: 1/Erin Cirri9/1920  DOS: the patient was seen and examined on 07/21/2015 PCP: [name],[name], MD  Referring physician: Dr. Clarice PolePfeifer Chief Complaint: Passing out episode  HPI: Erin Gibson is a 81 y.o. female with Past medical history of anemia, GERD, hypertension, chronic kidney disease. The patient is presenting with complaints of an abdominal pain as well as nausea and vomiting as well as passing out episode. Patient mentions that when she woke up in the morning she was at her baseline. Later on as the day progresses he started having complaints of some peri-umbilical pain. This was followed by complaints of nausea and vomiting. Patient complains of increased urinary frequency as well as urinary incontinence ongoing for last few days. She denies any diarrhea or black color bowel movements or rectal bleeding. She denies any recent change in her medications as well. No fever no chills. After her last vomiting episode while patient was coming out of the bathroom she should only felt dizzy and lightheaded and went to her bedroom and laid down on the bed and passed out. Patient woke up and called EMS and she was brought to the hospital. No focal deficit at the time of my evaluation reported by patient.  The patient is coming from home.  At her baseline ambulates with walker And is independent for most of her ADL; manages her medication on her own.  Review of Systems: as mentioned in the history of present illness.  A comprehensive review of the other systems is negative.  Past Medical History  Diagnosis Date  . ANEMIA 03/31/2009  . DEGENERATIVE JOINT DISEASE, GENERALIZED 05/06/2007  . GERD 04/24/2007  . HYPERTENSION 04/24/2007  . VENOUS STASIS ULCER 07/20/2008  . DVT (deep venous thrombosis) (HCC)     hx of  . Chronic kidney disease   . Diverticulosis of sigmoid colon     Colonoscopy 2011    Past Surgical History  Procedure Laterality Date  . Cataract extraction    . Abdominal hysterectomy      Completion hysterectomy  . Total knee arthroplasty      x2  . Hernia repair    . Cholecystectomy open    . Partial hysterectomy      Salpingo-oophorectomy as well   Social History:  reports that she has never smoked. She has never used smokeless tobacco. She reports that she does not drink alcohol or use illicit drugs.  Allergies  Allergen Reactions  . Codeine Nausea And Vomiting    Violently ill  . Diltiazem Hcl Other (See Comments)    Not sure about this reaction  . Ibuprofen Nausea And Vomiting  . Tramadol     Auditory and visual hallucinations    No family history on file.  Prior to Admission medications   Medication Sig Start Date End Date Taking? Authorizing Provider  Artificial Tear GEL Place 2 drops into both eyes 2 (two) times daily.   Yes Historical Provider, MD  aspirin EC 81 MG tablet Take 81 mg by mouth every morning.   Yes Historical Provider, MD  Calcium Carbonate-Vitamin D (CALCIUM 600 + D PO) Take 1 tablet by mouth every morning.   Yes Historical Provider, MD  Cyanocobalamin (VITAMIN B-12) 2500 MCG SUBL Place 1 tablet under the tongue every morning.   Yes Historical Provider, MD  diclofenac (VOLTAREN) 50 MG EC tablet Take 1 tablet (50 mg total) by mouth 2 (two) times daily. 06/24/15  Yes Gordy Savers, MD  furosemide (LASIX) 40 MG tablet TAKE 1/2 TABLET (=20MG )    DAILY 03/10/15  Yes Gordy Savers, MD  meclizine (ANTIVERT) 25 MG tablet TAKE 1 TABLET 3 TIMES A DAYAS NEEDED FOR DIZZINESS 12/23/14  Yes Gordy Savers, MD  nitroGLYCERIN (NITROSTAT) 0.4 MG SL tablet Place 0.4 mg under the tongue every 5 (five) minutes as needed for chest pain.   Yes Historical Provider, MD  omeprazole (PRILOSEC) 40 MG capsule Take 1 capsule (40 mg total) by mouth daily. 03/10/15  Yes Gordy Savers, MD  polyethylene glycol powder (GLYCOLAX/MIRALAX) powder MIX  17GM IN LIQUID AND     DRINK TWO TIMES A DAY 08/30/14  Yes Gordy Savers, MD  potassium chloride SA (K-DUR,KLOR-CON) 20 MEQ tablet Take 1 tablet (20 mEq total) by mouth daily. 07/24/13  Yes Gordy Savers, MD  quinapril (ACCUPRIL) 40 MG tablet Take 1 tablet (40 mg total) by mouth daily. 03/10/15  Yes Gordy Savers, MD  vitamin E (VITAMIN E) 400 UNIT capsule Take 400 Units by mouth every morning.    Yes Historical Provider, MD    Physical Exam: Filed Vitals:   07/21/15 2230 07/21/15 2315 07/22/15 0000 07/22/15 0125  BP: 124/45 115/55 134/84 148/44  Pulse: 60 64 65 55  Temp:    98.9 F (37.2 C)  TempSrc:    Oral  Resp: Height:     (1.499 m)  Weight:    50.485 kg (111 lb 4.8 oz)  SpO2: 91% 91% 93% 99%    General: Alert, Awake and Oriented to Time, Place and Person. Appear in mild distress Eyes: PERRL ENT: Oral Mucosa clear moist. Neck: no JVD Cardiovascular: S1 and S2 Present, no Murmur, Peripheral Pulses Present Respiratory: Bilateral Air entry equal and Decreased,  Clear to Auscultation, no Crackles, no wheezes Abdomen: Bowel Sound present, Soft and mild diffuse tenderness Skin: no Rash Extremities: no Pedal edema, no calf tenderness Neurologic: Grossly no focal neuro deficit.  Labs on Admission:  CBC:  Recent Labs Lab 07/21/15 1835  WBC 9.8  HGB 13.1  HCT 40.4  MCV 91.4  PLT 192    CMP     Component Value Date/Time   NA 143 07/21/2015 1835   K 4.8 07/21/2015 1835   CL 109 07/21/2015 1835   CO2 24 07/21/2015 1835   GLUCOSE 111* 07/21/2015 1835   GLUCOSE 89 10/02/2006 1104   BUN 30* 07/21/2015 1835   CREATININE 1.34* 07/21/2015 1835   CALCIUM 10.0 07/21/2015 1835   PROT 6.2* 07/21/2015 1835   ALBUMIN 3.6 07/21/2015 1835   AST 26 07/21/2015 1835   ALT 16 07/21/2015 1835   ALKPHOS 118 07/21/2015 1835   BILITOT 1.0 07/21/2015 1835   GFRNONAA 33* 07/21/2015 1835   GFRAA 38* 07/21/2015 1835    No results for input(s):  CKTOTAL, CKMB, CKMBINDEX, TROPONINI in the last 168 hours. BNP (last 3 results) No results for input(s): BNP in the last 8760 hours.  ProBNP (last 3 results) No results for input(s): PROBNP in the last 8760 hours.   Radiological Exams on Admission: Ct Abdomen Pelvis Wo Contrast  07/21/2015  CLINICAL DATA:  Acute right-sided abdominal pain. EXAM: CT ABDOMEN AND PELVIS WITHOUT CONTRAST TECHNIQUE: Multidetector CT imaging of the abdomen and pelvis was performed following the standard protocol without IV contrast. COMPARISON:  CT scan of March 09, 2013. FINDINGS: Multilevel degenerative disc disease is noted in the lumbar spine. 6  mm nodule is noted posteriorly in the right lower lobe. No gallstones are noted. No focal abnormality is noted in the liver, spleen or pancreas on these unenhanced images. Stable 5.4 cm calcified cyst is seen arising from upper pole of right kidney. Left kidney appears normal. Adrenal glands are not well visualized. Atherosclerosis of abdominal aorta is noted with 3.4 cm infrarenal abdominal aortic aneurysm. There is no evidence of bowel obstruction. No abnormal fluid collection is noted. Urinary bladder appears normal. Mild left inguinal hernia is noted which contains a loop of bowel, but does not result in incarceration or obstruction. No significant adenopathy is noted. IMPRESSION: Stable calcified 5.4 cm cyst is seen arising from upper pole of right kidney. Calcified 3.4 cm infrarenal abdominal aortic aneurysm is noted. Recommend followup by ultrasound in 3 years. This recommendation follows ACR consensus guidelines: White Paper of the ACR Incidental Findings Committee II on Vascular Findings. J Am Coll Radiol 2013; 10:789-794. Stable mild left inguinal hernia is noted which contains a loop of bowel, but does not result in incarceration or obstruction. 6 mm nodule is noted in the right lower lobe. If the patient is at high risk for bronchogenic carcinoma, follow-up chest CT at  6-12 months is recommended. If the patient is at low risk for bronchogenic carcinoma, follow-up chest CT at 12 months is recommended. This recommendation follows the consensus statement: Guidelines for Management of Small Pulmonary Nodules Detected on CT Scans: A Statement from the Fleischner Society as published in Radiology 2005;237:395-400. Electronically Signed   By: Lupita Raider, M.D.   On: 07/21/2015 20:37   Dg Abd Acute W/chest  07/21/2015  CLINICAL DATA:  Mid abdominal pain. EXAM: DG ABDOMEN ACUTE W/ 1V CHEST COMPARISON:  Chest x-ray dated 05/08/2015 FINDINGS: Normal heart size and pulmonary vascularity. No focal airspace disease or consolidation in the lungs. No blunting of costophrenic angles. No pneumothorax. Mediastinal contours appear intact. Atherosclerotic disease and tortuosity of the aorta are seen. There is chronic elevation of the left hemidiaphragm. Scattered gas and stool in the colon. No small or large bowel distention. No free intra-abdominal air. No abnormal air-fluid levels. There is a suggestion of wall thickening of several small bowel loops within the lower abdomen. No radiopaque stones. There is a 5.7 cm peripherally calcified structure, which in correlation to prior CT dated 03/09/2013 represents a renal lesion, mostly stable considering cross modality comparison. Visualized bones appear intact. IMPRESSION: No radiographic evidence of acute cardiopulmonary abnormality. Diffuse gaseous distension of the abdomen, without evidence of small-bowel obstruction. Probable small bowel wall thickening in the lower abdomen. 5.7 cm right renal lesion, mostly stable considering cross modality comparison. Electronically Signed   By: Ted Mcalpine M.D.   On: 07/21/2015 19:01   EKG: Independently reviewed. normal sinus rhythm, nonspecific ST and T waves changes.  Assessment/Plan 1. Syncope The patient is presenting with a syncopal events. The patient's syncopal event visited by  multiple episodes of vomiting as well as abdominal pain. She is found to be having a UTI. CT scan of the abdomen is negative for any acute intra-abdominal pathology. Her lab work is also reassuring. With this most likely had syncope appeared secondary to dehydration and UTI. I would treat her with IV fluids monitor her on telemetry given IV ceftriaxone. Follow the cultures. She has a significant cardiac murmur and therefore I would check an echocardiogram in the morning to rule out any valvular disease precipitating along with hypotension.  2.  Essential hypertension Currently holding blood  pressure medications.  3  GERD Continuing PPI.  4  UTI (urinary tract infection) Continue ceftriaxone. Follow the cultures.  5  Abdominal pain 6  Nausea with vomiting CT of the abdomen is negative for any acute intra-abdominal pathology. Lab work is also reassuring. Most likely secondary to UTI. We will need to continue close monitoring. Currently we will keep her on a liquid diet advance as tolerated  Nutrition: Advance as tolerated DVT Prophylaxis: subcutaneous Heparin  Advance goals of care discussion: DNR/DNI as per my discussion with patient   Disposition: Admitted as observation, telemetry unit.  Author: Lynden Oxford, MD [facility] Pager: [phone] 07/21/2015  If 7PM-7AM, please contact night-coverage [email or web address] Password [email or web address]

## 2015-07-23 DIAGNOSIS — R55 Syncope and collapse: Secondary | ICD-10-CM | POA: Insufficient documentation

## 2015-07-23 DIAGNOSIS — N183 Chronic kidney disease, stage 3 unspecified: Secondary | ICD-10-CM

## 2015-07-23 DIAGNOSIS — R1310 Dysphagia, unspecified: Secondary | ICD-10-CM

## 2015-07-23 LAB — CBC
HEMATOCRIT: 31.5 % — AB (ref 36.0–46.0)
Hemoglobin: 10.4 g/dL — ABNORMAL LOW (ref 12.0–15.0)
MCH: 30.3 pg (ref 26.0–34.0)
MCHC: 33 g/dL (ref 30.0–36.0)
MCV: 91.8 fL (ref 78.0–100.0)
PLATELETS: 165 10*3/uL (ref 150–400)
RBC: 3.43 MIL/uL — ABNORMAL LOW (ref 3.87–5.11)
RDW: 14.6 % (ref 11.5–15.5)
WBC: 5.1 10*3/uL (ref 4.0–10.5)

## 2015-07-23 LAB — BASIC METABOLIC PANEL
Anion gap: 9 (ref 5–15)
BUN: 19 mg/dL (ref 6–20)
CHLORIDE: 112 mmol/L — AB (ref 101–111)
CO2: 23 mmol/L (ref 22–32)
CREATININE: 1.09 mg/dL — AB (ref 0.44–1.00)
Calcium: 9.1 mg/dL (ref 8.9–10.3)
GFR calc Af Amer: 49 mL/min — ABNORMAL LOW (ref >60)
GFR calc non Af Amer: 42 mL/min — ABNORMAL LOW (ref >60)
GLUCOSE: 86 mg/dL (ref 65–99)
POTASSIUM: 3.8 mmol/L (ref 3.5–5.1)
Sodium: 144 mmol/L (ref 135–145)

## 2015-07-23 LAB — GLUCOSE, CAPILLARY: Glucose-Capillary: 83 mg/dL (ref 65–99)

## 2015-07-23 MED ORDER — HYDRALAZINE HCL 10 MG PO TABS
10.0000 mg | ORAL_TABLET | Freq: Two times a day (BID) | ORAL | Status: DC
  Administered 2015-07-23 – 2015-07-25 (×4): 10 mg via ORAL
  Filled 2015-07-23 (×4): qty 1

## 2015-07-23 MED ORDER — FLUCONAZOLE 100 MG PO TABS
100.0000 mg | ORAL_TABLET | Freq: Every day | ORAL | Status: DC
  Administered 2015-07-24 – 2015-07-26 (×3): 100 mg via ORAL
  Filled 2015-07-23 (×5): qty 1

## 2015-07-23 MED ORDER — HYDRALAZINE HCL 20 MG/ML IJ SOLN
5.0000 mg | Freq: Four times a day (QID) | INTRAMUSCULAR | Status: DC | PRN
  Administered 2015-07-25: 5 mg via INTRAVENOUS
  Filled 2015-07-23 (×2): qty 1

## 2015-07-23 MED ORDER — FLUCONAZOLE 200 MG PO TABS
200.0000 mg | ORAL_TABLET | Freq: Once | ORAL | Status: AC
  Administered 2015-07-23: 200 mg via ORAL
  Filled 2015-07-23: qty 1

## 2015-07-23 NOTE — Progress Notes (Signed)
PROGRESS NOTE  Dawayne CirriViolet W Yadav JYN:[id number] DOB: 09/18/1919 [name]: 07/21/2015 PCP: [name],[name], MD   Brief History 81 year old female with a history of hypertension, CKD sensory, normocytic anemia presented with 1 day history of nausea, vomiting, abdominal pain with resultant syncope. The patient states that on 07/21/2015 she developed epigastric and periumbilical abdominal pain that began that morning. She had subsequently 6 episodes of emesis without any blood. On the evening of 07/21/2015, the patient was going back to the bedroom after episode of emesis when she began feeling dizzy and subsequently had a syncopal episode falling onto her bed. When she woke up on her bed, EMS was activated. The patient denies any head injuries. However, she complains of some chest discomfort without any shortness of breath. The patient has not had any vomiting since arrival to the hospital. Her abdominal pain is somewhat better. She also complains of some dysuria which is been going on for at least the past month. The patient lives by herself at home and uses a walker for ambulation. She denies any recent travels or eating any raw or undercooked food. Assessment/Plan: Syncope -Vasovagal in etiology -Remain on telemetry -Continue IV fluids -Orthostatic vital signs negative Acute gastroenteritis -Seems to be improving--no further vomiting since admission -07/21/2015 CT abdomen and pelvis negative for bowel obstruction. No mention of bowel wall thickening. No hydronephrosis. -tolerating solid food without vomiting -The patient's urinary tract infection may also be contributing -Continue intravenous fluids -Continue PPI for now Dysphagia/odynophagia -barium swallow/esophagram if possible -may need GI eval -only with solid food -empiric fluconazole Pyuria -Concern for UTI -Continue ceftriaxone--unfortunately a culture was not performed Hypertension  -Continue quinapril -Add  amlodipine CKD stage III  -baseline creatinine 1.0-1.3 Normocytic anemia  -Baseline hemoglobin of 11-12  -Drop in hemoglobin likely dilution  -check iron--iron sat 30%  -B12--255-->replete -RBC folate--pending Right lung nodule  -Incidental finding on CT  -Outpatient surveillance  Deconditioning  -PT evaluation  -Suspect patient will need skilled nursing facility    Family Communication: Pt at beside Disposition Plan: SNF in 1-2 days   Procedures/Studies: Ct Abdomen Pelvis Wo Contrast  07/21/2015  CLINICAL DATA:  Acute right-sided abdominal pain. EXAM: CT ABDOMEN AND PELVIS WITHOUT CONTRAST TECHNIQUE: Multidetector CT imaging of the abdomen and pelvis was performed following the standard protocol without IV contrast. COMPARISON:  CT scan of March 09, 2013. FINDINGS: Multilevel degenerative disc disease is noted in the lumbar spine. 6 mm nodule is noted posteriorly in the right lower lobe. No gallstones are noted. No focal abnormality is noted in the liver, spleen or pancreas on these unenhanced images. Stable 5.4 cm calcified cyst is seen arising from upper pole of right kidney. Left kidney appears normal. Adrenal glands are not well visualized. Atherosclerosis of abdominal aorta is noted with 3.4 cm infrarenal abdominal aortic aneurysm. There is no evidence of bowel obstruction. No abnormal fluid collection is noted. Urinary bladder appears normal. Mild left inguinal hernia is noted which contains a loop of bowel, but does not result in incarceration or obstruction. No significant adenopathy is noted. IMPRESSION: Stable calcified 5.4 cm cyst is seen arising from upper pole of right kidney. Calcified 3.4 cm infrarenal abdominal aortic aneurysm is noted. Recommend followup by ultrasound in 3 years. This recommendation follows ACR consensus guidelines: White Paper of the ACR Incidental Findings Committee II on Vascular Findings. J Am Coll Radiol 2013; 10:789-794. Stable mild left  inguinal hernia is noted which contains a loop  of bowel, but does not result in incarceration or obstruction. 6 mm nodule is noted in the right lower lobe. If the patient is at high risk for bronchogenic carcinoma, follow-up chest CT at 6-12 months is recommended. If the patient is at low risk for bronchogenic carcinoma, follow-up chest CT at 12 months is recommended. This recommendation follows the consensus statement: Guidelines for Management of Small Pulmonary Nodules Detected on CT Scans: A Statement from the Fleischner Society as published in Radiology 2005;237:395-400. Electronically Signed   By: Lupita Raider, M.D.   On: 07/21/2015 20:37   Dg Abd Acute W/chest  07/21/2015  CLINICAL DATA:  Mid abdominal pain. EXAM: DG ABDOMEN ACUTE W/ 1V CHEST COMPARISON:  Chest x-ray dated 05/08/2015 FINDINGS: Normal heart size and pulmonary vascularity. No focal airspace disease or consolidation in the lungs. No blunting of costophrenic angles. No pneumothorax. Mediastinal contours appear intact. Atherosclerotic disease and tortuosity of the aorta are seen. There is chronic elevation of the left hemidiaphragm. Scattered gas and stool in the colon. No small or large bowel distention. No free intra-abdominal air. No abnormal air-fluid levels. There is a suggestion of wall thickening of several small bowel loops within the lower abdomen. No radiopaque stones. There is a 5.7 cm peripherally calcified structure, which in correlation to prior CT dated 03/09/2013 represents a renal lesion, mostly stable considering cross modality comparison. Visualized bones appear intact. IMPRESSION: No radiographic evidence of acute cardiopulmonary abnormality. Diffuse gaseous distension of the abdomen, without evidence of small-bowel obstruction. Probable small bowel wall thickening in the lower abdomen. 5.7 cm right renal lesion, mostly stable considering cross modality comparison. Electronically Signed   By: Ted Mcalpine M.D.    On: 07/21/2015 19:01         Subjective: Patient complains of dysphagia and odynophagia. She denies any fevers, chills, chest pain, short, diarrhea, hematochezia, melena. She vomiting and abdominal pain are improving. Denies any dysuria, hematuria.   Objective: Filed Vitals:   07/22/15 1712 07/22/15 2007 07/23/15 0333 07/23/15 1216  BP: 160/54 158/54 180/59 200/71  Pulse: 55 55 75 59  Temp: 97.6 F (36.4 C) 97.9 F (36.6 C) 98 F (36.7 C) 98.2 F (36.8 C)  TempSrc: Oral Oral Oral Oral  Resp: Height:      Weight:   50.4 kg (111 lb 1.8 oz)   SpO2: 96% 98% 98% 97%    Intake/Output Summary (Last 24 hours) at 07/23/15 1708 Last data filed at 07/23/15 1441  Gross per 24 hour  Intake 1943.75 ml  Output   3550 ml  Net -1606.25 ml   Weight change: 3.226 kg (7 lb 1.8 oz) Exam:   General:  Pt is alert, follows commands appropriately, not in acute distress  HEENT: No icterus, No thrush, No neck mass, [place]/AT  Cardiovascular: RRR, S1/S2, no rubs, no gallops  Respiratory: bibasilar crackles but no wheeze. Good air movement   Abdomen: Soft/+BS, non tender, non distended, no guarding  Extremities: No edema, No lymphangitis, No petechiae, No rashes, no synovitis  Data Reviewed: Basic Metabolic Panel:  Recent Labs Lab 07/21/15 1835 07/22/15 0342 07/23/15 0420  NA 143 143 144  K 4.8 4.2 3.8  CL 109 110 112*  CO2 GLUCOSE 111* 89 86  BUN 30* 28* 19  CREATININE 1.34* 1.20* 1.09*  CALCIUM 10.0 8.9 9.1   Liver Function Tests:  Recent Labs Lab 07/21/15 1835 07/22/15 0342  AST 26 20  ALT 16 13*  ALKPHOS 118 93  BILITOT 1.0 0.6  PROT 6.2* 4.6*  ALBUMIN 3.6 2.5*    Recent Labs Lab 07/21/15 1835  LIPASE 58*   No results for input(s): AMMONIA in the last 168 hours. CBC:  Recent Labs Lab 07/21/15 1835 07/22/15 0342 07/22/15 0645 07/23/15 0420  WBC 9.8 7.1 6.5 5.1  NEUTROABS  --   --  4.5  --   HGB 13.1 9.8* 10.8* 10.4*  HCT 40.4  29.6* 33.3* 31.5*  MCV 91.4 90.8 92.8 91.8  PLT 192 160 156 165   Cardiac Enzymes: No results for input(s): CKTOTAL, CKMB, CKMBINDEX, TROPONINI in the last 168 hours. BNP: Invalid input(s): POCBNP CBG:  Recent Labs Lab 07/22/15 0601 07/23/15 0533  GLUCAP 77 83    No results found for this or any previous visit (from the past 240 hour(s)).   Scheduled Meds: . aspirin EC  81 mg Oral q morning - 10a  . cefTRIAXone (ROCEPHIN)  IV  1 g Intravenous Q24H  . heparin  5,000 Units Subcutaneous 3 times per day  . pantoprazole  40 mg Oral Daily  . quinapril  40 mg Oral Daily  . sodium chloride  3 mL Intravenous Q12H  . vitamin B-12  500 mcg Oral Daily   Continuous Infusions: . sodium chloride 75 mL/hr at 07/22/15 1700     Laszlo Ellerby, DO  [facility] Pager [phone]  If 7PM-7AM, please contact night-coverage [email or web address] Password [email or web address] 07/23/2015, 5:08 PM   LOS: 1 day

## 2015-07-23 NOTE — Progress Notes (Signed)
Bed offers in place and discussed with patient. [facility] [address]  Patient states that she has been a resident there in the past and was very pleased with the care she received there.  Awaiting stability per MD;  Admissions at [facility] state that they can accept patient on Monday 07/25/15.  Per patient's request CSW notified patient's daughter Dondra SpryGail who is very pleased with bed offer. She recently underwent shoulder surgery and cannot drive; [place] and verbalized that she worries about her mother and not being able to be with her in the hospital. Support and encouragement provided and daughter reassured that she will be notified once d/c plan is fully in place. She was very relieved about this.  Lorri Frederickonna T. Jaci LazierCrowder, Kentucky[place]CSW [phone] (weekend coverage)

## 2015-07-23 NOTE — Clinical Social Work Placement (Signed)
   CLINICAL SOCIAL WORK PLACEMENT  NOTE  Date:  07/23/2015  Patient Details  Name: Erin Gibson MRN: [id number] Date of Birth: 01/30/1919  Clinical Social Work is seeking post-discharge placement for this patient at the Skilled  Nursing Facility level of care (*CSW will initial, date and re-position this form in  chart as items are completed):  Yes   Patient/family provided with [facility] Clinical Social Work Department's list of facilities offering this level of care within the geographic area requested by the patient (or if unable, by the patient's family).  Yes   Patient/family informed of their freedom to choose among providers that offer the needed level of care, that participate in Medicare, Medicaid or managed care program needed by the patient, have an available bed and are willing to accept the patient.  Yes   Patient/family informed of [facility]'s ownership interest in [facility] and [facility], as well as of the fact that they are under no obligation to receive care at these facilities.  PASRR submitted to EDS on 07/22/15     PASRR number received on 07/22/15     Existing PASRR number confirmed on       FL2 transmitted to all facilities in geographic area requested by pt/family on 07/22/15     FL2 transmitted to all facilities within larger geographic area on       Patient informed that his/her managed care company has contracts with or will negotiate with certain facilities, including the following:        Yes   Patient/family informed of bed offers received.  Patient chooses bed at [facility] [place]     Physician recommends and patient chooses bed at      Patient to be transferred to   on  .  Patient to be transferred to facility by Ambulance  [facility] Mons([facility])     Patient family notified on   of transfer.  Name of family member notified:        PHYSICIAN Please prepare priority discharge summary, including medications, Please sign DNR, Please  prepare prescriptions, Please sign FL2     Additional Comment:    _______________________________________________ Darylene Pricerowder, Brendon Christoffel T, LCSW 07/23/2015, 3:07 PM

## 2015-07-24 DIAGNOSIS — R112 Nausea with vomiting, unspecified: Secondary | ICD-10-CM

## 2015-07-24 DIAGNOSIS — I10 Essential (primary) hypertension: Secondary | ICD-10-CM

## 2015-07-24 LAB — GLUCOSE, CAPILLARY: GLUCOSE-CAPILLARY: 81 mg/dL (ref 65–99)

## 2015-07-24 MED ORDER — PANTOPRAZOLE SODIUM 40 MG IV SOLR
40.0000 mg | Freq: Two times a day (BID) | INTRAVENOUS | Status: DC
  Administered 2015-07-24 – 2015-07-25 (×3): 40 mg via INTRAVENOUS
  Filled 2015-07-24 (×3): qty 40

## 2015-07-24 NOTE — Consult Note (Signed)
Referring Provider: Dr. Arbutus Leasat Primary Care Physician:  [name],[name], MD Primary Gastroenterologist:  Gentry FitzUnassigned  Reason for Consultation:  Dysphagia  HPI: Erin Gibson is a 81 y.o. female with the acute onset of nausea, bilious (nonbloody) vomiting X 6, and syncope prior to admit. Reports having abdominal cramping along with a "tightening" sensation that she thought was due to an obstruction that she has reportedly had in the past. Abdominal cramping started 2 weeks ago. Following the N/V episodes she started having the sensation of food hanging up after she swallowed it but was able to tolerate liquids. Denies any trouble swallowing food or liquid prior to the onset of the nausea and vomiting. Feels like a sharp pain in her throat when she tries to swallow food. Denies hematemesis, melena, or hematochezia. History of an EGD in 2011 by Dr. Juanda ChanceBrodie that showed a benign-appearing distal esophageal stricture (no dilation was done). Incomplete colonoscopy in 2011 by Dr. Marina GoodellPerry that showed sigmoid diverticulosis and internal hemorrhoids. CT negative for small bowel obstruction.    Past Medical History  Diagnosis Date  . ANEMIA 03/31/2009  . DEGENERATIVE JOINT DISEASE, GENERALIZED 05/06/2007  . GERD 04/24/2007  . HYPERTENSION 04/24/2007  . VENOUS STASIS ULCER 07/20/2008  . DVT (deep venous thrombosis) (HCC)     hx of  . Chronic kidney disease   . Diverticulosis of sigmoid colon     Colonoscopy 2011    Past Surgical History  Procedure Laterality Date  . Cataract extraction    . Abdominal hysterectomy      Completion hysterectomy  . Total knee arthroplasty      x2  . Hernia repair    . Cholecystectomy open    . Partial hysterectomy      Salpingo-oophorectomy as well    Prior to Admission medications   Medication Sig Start Date End Date Taking? Authorizing Provider  Artificial Tear GEL Place 2 drops into both eyes 2 (two) times daily.   Yes Historical Provider, MD  aspirin EC 81  MG tablet Take 81 mg by mouth every morning.   Yes Historical Provider, MD  Calcium Carbonate-Vitamin D (CALCIUM 600 + D PO) Take 1 tablet by mouth every morning.   Yes Historical Provider, MD  Cyanocobalamin (VITAMIN B-12) 2500 MCG SUBL Place 1 tablet under the tongue every morning.   Yes Historical Provider, MD  diclofenac (VOLTAREN) 50 MG EC tablet Take 1 tablet (50 mg total) by mouth 2 (two) times daily. 06/24/15  Yes Gordy SaversPeter F Kwiatkowski, MD  furosemide (LASIX) 40 MG tablet TAKE 1/2 TABLET (=20MG )    DAILY 03/10/15  Yes Gordy SaversPeter F Kwiatkowski, MD  meclizine (ANTIVERT) 25 MG tablet TAKE 1 TABLET 3 TIMES A DAYAS NEEDED FOR DIZZINESS 12/23/14  Yes Gordy SaversPeter F Kwiatkowski, MD  nitroGLYCERIN (NITROSTAT) 0.4 MG SL tablet Place 0.4 mg under the tongue every 5 (five) minutes as needed for chest pain.   Yes Historical Provider, MD  omeprazole (PRILOSEC) 40 MG capsule Take 1 capsule (40 mg total) by mouth daily. 03/10/15  Yes Gordy SaversPeter F Kwiatkowski, MD  polyethylene glycol powder (GLYCOLAX/MIRALAX) powder MIX 17GM IN LIQUID AND     DRINK TWO TIMES A DAY 08/30/14  Yes Gordy SaversPeter F Kwiatkowski, MD  potassium chloride SA (K-DUR,KLOR-CON) 20 MEQ tablet Take 1 tablet (20 mEq total) by mouth daily. 07/24/13  Yes Gordy SaversPeter F Kwiatkowski, MD  quinapril (ACCUPRIL) 40 MG tablet Take 1 tablet (40 mg total) by mouth daily. 03/10/15  Yes Gordy SaversPeter F Kwiatkowski, MD  vitamin E (  VITAMIN E) 400 UNIT capsule Take 400 Units by mouth every morning.    Yes Historical Provider, MD    Scheduled Meds: . aspirin EC  81 mg Oral q morning - 10a  . cefTRIAXone (ROCEPHIN)  IV  1 g Intravenous Q24H  . fluconazole  100 mg Oral Daily  . heparin  5,000 Units Subcutaneous 3 times per day  . hydrALAZINE  10 mg Oral BID  . pantoprazole  40 mg Oral Daily  . quinapril  40 mg Oral Daily  . sodium chloride  3 mL Intravenous Q12H  . vitamin B-12  500 mcg Oral Daily   Continuous Infusions: . sodium chloride 50 mL/hr at 07/23/15 1757   PRN Meds:.acetaminophen  **OR** acetaminophen, hydrALAZINE, meclizine, ondansetron **OR** ondansetron (ZOFRAN) IV  Allergies as of 07/21/2015 - Review Complete 07/21/2015  Allergen Reaction Noted  . Codeine Nausea And Vomiting   . Diltiazem hcl Other (See Comments)   . Ibuprofen Nausea And Vomiting   . Tramadol  01/22/2014    History reviewed. No pertinent family history.  Social History   Social History  . Marital Status: Widowed    Spouse Name: N/A  . Number of Children: N/A  . Years of Education: N/A   Occupational History  . Not on file.   Social History Main Topics  . Smoking status: Never Smoker   . Smokeless tobacco: Never Used  . Alcohol Use: No  . Drug Use: No  . Sexual Activity: Not on file   Other Topics Concern  . Not on file   Social History Narrative    Review of Systems: All negative except as stated above in HPI.  Physical Exam: Vital signs: Filed Vitals:   07/24/15 1245  BP: 142/76  Pulse: 77  Temp: 98.6 F (37 C)  Resp: 20   Last BM Date: 07/23/15 General:   Elderly, Alert,  Thin, pleasant and cooperative in NAD Head: atraumatic Eyes: anicteric sclera ENT: oropharynx clear Neck: supple, nontender Lungs:  Clear throughout to auscultation.   No wheezes, crackles, or rhonchi. No acute distress. Heart:  Regular rate and rhythm; no murmurs, clicks, rubs,  or gallops. Abdomen: diffusely tender with guarding, soft, nondistended, +BS Rectal:  Deferred Ext: pedal pulses intact, no edema Skin: no jaundice  GI:  Lab Results:  Recent Labs  07/22/15 0342 07/22/15 0645 07/23/15 0420  WBC 7.1 6.5 5.1  HGB 9.8* 10.8* 10.4*  HCT 29.6* 33.3* 31.5*  PLT 160 156 165   BMET  Recent Labs  07/21/15 1835 07/22/15 0342 07/23/15 0420  NA 143 143 144  K 4.8 4.2 3.8  CL 109 110 112*  CO2 GLUCOSE 111* 89 86  BUN 30* 28* 19  CREATININE 1.34* 1.20* 1.09*  CALCIUM 10.0 8.9 9.1   LFT  Recent Labs  07/22/15 0342  PROT 4.6*  ALBUMIN 2.5*  AST 20  ALT  13*  ALKPHOS 93  BILITOT 0.6   PT/INR  Recent Labs  07/22/15 0342  LABPROT 16.9*  INR 1.36     Studies/Results: No results found.  Impression/Plan: 81 yo with the acute onset of N/V followed by dysphagia to solid foods. Candida esophagitis vs. Pill-induced esophagitis vs. Erosive esophagitis. Doubt peptic stricture as source of her symptoms. She likely has age-related dysmotility as well. No evidence of a GI bleed to suggest a Mallory-Weiss tear but that would cause odynophagia as well. Agree with planned barium swallow as the next step and empiric Diflucan. Change to  Protonix 40 mg IV Q 12 hours. Change to clear liquid diet. NPO p MN for barium swallow tomorrow. May need a trial of Carafate if barium swallow normal and symptoms persisting despite the above recs. Recommend conservative management and hold off on EGD unless barium swallow shows stricture or other obstructive process. [facility] will follow up tomorrow.    LOS: 2 days   Amando Ishikawa C.  07/24/2015, 2:48 PM  Pager [phone]  If no answer or after 5 PM call [phone]

## 2015-07-24 NOTE — Progress Notes (Signed)
PROGRESS NOTE  Erin Gibson:[id number] DOB: July 12, 1919 [name]: 07/21/2015 PCP: Rogelia Boga, MD   Brief History 81 year old female with a history of hypertension, CKD sensory, normocytic anemia presented with 1 day history of nausea, vomiting, abdominal pain with resultant syncope. The patient states that on 07/21/2015 she developed epigastric and periumbilical abdominal pain that began that morning. She had subsequently 6 episodes of emesis without any blood. On the evening of 07/21/2015, the patient was going back to the bedroom after episode of emesis when she began feeling dizzy and subsequently had a syncopal episode falling onto her bed. When she woke up on her bed, EMS was activated. The patient denies any head injuries. However, she complains of some chest discomfort without any shortness of breath. The patient has not had any vomiting since arrival to the hospital. Her abdominal pain is somewhat better. She also complains of some dysuria which is been going on for at least the past month. The patient lives by herself at home and uses a walker for ambulation. She denies any recent travels or eating any raw or undercooked food. Assessment/Plan: Syncope -Vasovagal in etiology -Remain on telemetry -Continue IV fluids -Orthostatic vital signs negative Acute gastroenteritis -Seems to be improving--no further vomiting since admission -07/21/2015 CT abdomen and pelvis negative for bowel obstruction. No mention of bowel wall thickening. No hydronephrosis. -tolerating solid food without vomiting -The patient's urinary tract infection may also be contributing -Continue intravenous fluids -Continue PPI for now Dysphagia/odynophagia -barium swallow/esophagram unavailable presently until 10/31 -consult GI as pt continues to have difficult with solid food--"feels like a knife" -only with solid food -suspect Mallory-Weiss tear with recent emesis -empiric  fluconazole Pyuria -Concern for UTI -Continue ceftriaxone--unfortunately a culture was not performed Hypertension  -Continue quinapril -Added hydralazine CKD stage III  -baseline creatinine 1.0-1.3 Normocytic anemia  -Baseline hemoglobin of 11-12  -Drop in hemoglobin likely dilution  -check iron--iron sat 30%  -B12--255-->replete -RBC folate--pending Right lung nodule  -Incidental finding on CT  -Outpatient surveillance  Deconditioning  -PT evaluation-->SNF   Family Communication: Pt at beside Disposition Plan: SNF in 1-2 days; pending GI eval     Procedures/Studies: Ct Abdomen Pelvis Wo Contrast  07/21/2015  CLINICAL DATA:  Acute right-sided abdominal pain. EXAM: CT ABDOMEN AND PELVIS WITHOUT CONTRAST TECHNIQUE: Multidetector CT imaging of the abdomen and pelvis was performed following the standard protocol without IV contrast. COMPARISON:  CT scan of March 09, 2013. FINDINGS: Multilevel degenerative disc disease is noted in the lumbar spine. 6 mm nodule is noted posteriorly in the right lower lobe. No gallstones are noted. No focal abnormality is noted in the liver, spleen or pancreas on these unenhanced images. Stable 5.4 cm calcified cyst is seen arising from upper pole of right kidney. Left kidney appears normal. Adrenal glands are not well visualized. Atherosclerosis of abdominal aorta is noted with 3.4 cm infrarenal abdominal aortic aneurysm. There is no evidence of bowel obstruction. No abnormal fluid collection is noted. Urinary bladder appears normal. Mild left inguinal hernia is noted which contains a loop of bowel, but does not result in incarceration or obstruction. No significant adenopathy is noted. IMPRESSION: Stable calcified 5.4 cm cyst is seen arising from upper pole of right kidney. Calcified 3.4 cm infrarenal abdominal aortic aneurysm is noted. Recommend followup by ultrasound in 3 years. This recommendation follows ACR consensus guidelines: White Paper  of the ACR Incidental Findings Committee II on Vascular Findings. J Am Coll  Radiol 2013; 16:109-604; 10:789-794. Stable mild left inguinal hernia is noted which contains a loop of bowel, but does not result in incarceration or obstruction. 6 mm nodule is noted in the right lower lobe. If the patient is at high risk for bronchogenic carcinoma, follow-up chest CT at 6-12 months is recommended. If the patient is at low risk for bronchogenic carcinoma, follow-up chest CT at 12 months is recommended. This recommendation follows the consensus statement: Guidelines for Management of Small Pulmonary Nodules Detected on CT Scans: A Statement from the Fleischner Society as published in Radiology 2005;237:395-400. Electronically Signed   By: Lupita RaiderJames  Green Jr, M.D.   On: 07/21/2015 20:37   Dg Abd Acute W/chest  07/21/2015  CLINICAL DATA:  Mid abdominal pain. EXAM: DG ABDOMEN ACUTE W/ 1V CHEST COMPARISON:  Chest x-ray dated 05/08/2015 FINDINGS: Normal heart size and pulmonary vascularity. No focal airspace disease or consolidation in the lungs. No blunting of costophrenic angles. No pneumothorax. Mediastinal contours appear intact. Atherosclerotic disease and tortuosity of the aorta are seen. There is chronic elevation of the left hemidiaphragm. Scattered gas and stool in the colon. No small or large bowel distention. No free intra-abdominal air. No abnormal air-fluid levels. There is a suggestion of wall thickening of several small bowel loops within the lower abdomen. No radiopaque stones. There is a 5.7 cm peripherally calcified structure, which in correlation to prior CT dated 03/09/2013 represents a renal lesion, mostly stable considering cross modality comparison. Visualized bones appear intact. IMPRESSION: No radiographic evidence of acute cardiopulmonary abnormality. Diffuse gaseous distension of the abdomen, without evidence of small-bowel obstruction. Probable small bowel wall thickening in the lower abdomen. 5.7 cm right renal  lesion, mostly stable considering cross modality comparison. Electronically Signed   By: Ted Mcalpineobrinka  Dimitrova M.D.   On: 07/21/2015 19:01         Subjective: Patient states that her abdominal pain overall is better, but continues to complain of odynophagia. Denies any fevers, chills, chest pain, shortness breath, nausea, vomiting, diarrhea. No hematochezia or melena. No abdominal pain.  Objective: Filed Vitals:   07/23/15 0333 07/23/15 1216 07/23/15 2022 07/24/15 0416  BP: 180/59 200/71 180/85 170/69  Pulse: 75 59 61 60  Temp: 98 F (36.7 C) 98.2 F (36.8 C) 97.9 F (36.6 C) 98 F (36.7 C)  TempSrc: Oral Oral Oral Rectal  Resp: 18 18 18 18   Height:      Weight: 50.4 kg (111 lb 1.8 oz)   47.718 kg (105 lb 3.2 oz)  SpO2: 98% 97% 95% 96%    Intake/Output Summary (Last 24 hours) at 07/24/15 1246 Last data filed at 07/24/15 0943  Gross per 24 hour  Intake 1979.17 ml  Output   3050 ml  Net -1070.83 ml   Weight change: -2.682 kg (-5 lb 14.6 oz) Exam:   General:  Pt is alert, follows commands appropriately, not in acute distress  HEENT: No icterus, No thrush, No neck mass, [place]/AT  Cardiovascular: RRR, S1/S2, no rubs, no gallops  Respiratory: Bibasilar crackles. No wheezing. Good air movement  Abdomen: Soft/+BS, non tender, non distended, no guarding  Extremities: No edema, No lymphangitis, No petechiae, No rashes, no synovitis; no cyanosis or clubbing  Data Reviewed: Basic Metabolic Panel:  Recent Labs Lab 07/21/15 1835 07/22/15 0342 07/23/15 0420  NA 143 143 144  K 4.8 4.2 3.8  CL 109 110 112*  CO2 24 24 23   GLUCOSE 111* 89 86  BUN 30* 28* 19  CREATININE 1.34* 1.20* 1.09*  CALCIUM 10.0 8.9 9.1   Liver Function Tests:  Recent Labs Lab 07/21/15 1835 07/22/15 0342  AST 26 20  ALT 16 13*  ALKPHOS 118 93  BILITOT 1.0 0.6  PROT 6.2* 4.6*  ALBUMIN 3.6 2.5*    Recent Labs Lab 07/21/15 1835  LIPASE 58*   No results for input(s): AMMONIA in the last  168 hours. CBC:  Recent Labs Lab 07/21/15 1835 07/22/15 0342 07/22/15 0645 07/23/15 0420  WBC 9.8 7.1 6.5 5.1  NEUTROABS  --   --  4.5  --   HGB 13.1 9.8* 10.8* 10.4*  HCT 40.4 29.6* 33.3* 31.5*  MCV 91.4 90.8 92.8 91.8  PLT 192 160 156 165   Cardiac Enzymes: No results for input(s): CKTOTAL, CKMB, CKMBINDEX, TROPONINI in the last 168 hours. BNP: Invalid input(s): POCBNP CBG:  Recent Labs Lab 07/22/15 0601 07/23/15 0533 07/24/15 0600  GLUCAP 77 83 81    No results found for this or any previous visit (from the past 240 hour(s)).   Scheduled Meds: . aspirin EC  81 mg Oral q morning - 10a  . cefTRIAXone (ROCEPHIN)  IV  1 g Intravenous Q24H  . fluconazole  100 mg Oral Daily  . heparin  5,000 Units Subcutaneous 3 times per day  . hydrALAZINE  10 mg Oral BID  . pantoprazole  40 mg Oral Daily  . quinapril  40 mg Oral Daily  . sodium chloride  3 mL Intravenous Q12H  . vitamin B-12  500 mcg Oral Daily   Continuous Infusions: . sodium chloride 50 mL/hr at 07/23/15 1757     Marke Goodwyn, DO  [facility] Pager [phone]  If 7PM-7AM, please contact night-coverage [email or web address] Password [email or web address] 07/24/2015, 12:46 PM   LOS: 2 days

## 2015-07-25 ENCOUNTER — Inpatient Hospital Stay (HOSPITAL_COMMUNITY): Payer: Medicare Other

## 2015-07-25 DIAGNOSIS — R1310 Dysphagia, unspecified: Secondary | ICD-10-CM

## 2015-07-25 LAB — GLUCOSE, CAPILLARY: Glucose-Capillary: 81 mg/dL (ref 65–99)

## 2015-07-25 MED ORDER — HYDRALAZINE HCL 25 MG PO TABS: 25.0000 mg | ORAL_TABLET | Freq: Once | ORAL | Status: DC

## 2015-07-25 MED ORDER — SUCRALFATE 1 GM/10ML PO SUSP
1.0000 g | Freq: Three times a day (TID) | ORAL | Status: DC
  Administered 2015-07-25: 1 g via ORAL
  Filled 2015-07-25: qty 10

## 2015-07-25 MED ORDER — HYDRALAZINE HCL 25 MG PO TABS: 25.0000 mg | ORAL_TABLET | Freq: Two times a day (BID) | ORAL | Status: DC

## 2015-07-25 MED ORDER — SUCRALFATE 1 GM/10ML PO SUSP
1.0000 g | Freq: Three times a day (TID) | ORAL | Status: DC
  Administered 2015-07-25 – 2015-07-26 (×3): 1 g via ORAL
  Filled 2015-07-25 (×3): qty 10

## 2015-07-25 MED ORDER — IOHEXOL 300 MG/ML  SOLN
150.0000 mL | Freq: Once | INTRAMUSCULAR | Status: DC | PRN
  Administered 2015-07-25: 150 mL via ORAL
  Filled 2015-07-25: qty 150

## 2015-07-25 MED ORDER — HYDRALAZINE HCL 25 MG PO TABS
25.0000 mg | ORAL_TABLET | Freq: Three times a day (TID) | ORAL | Status: DC
  Administered 2015-07-25 – 2015-07-26 (×4): 25 mg via ORAL
  Filled 2015-07-25 (×4): qty 1

## 2015-07-25 NOTE — Plan of Care (Signed)
Problem: Phase I Progression Outcomes Goal: OOB as tolerated unless otherwise ordered Outcome: [facility]ing [facility]

## 2015-07-25 NOTE — Progress Notes (Signed)
Received report on [place]  In room 3E08 from [place], [place].  Patient is alert and oriented x 4.  Denies pain.  NS infusing @ 50 cc/hr.  Finished with clear liquid tray, post barium swallow, and tolerated well.

## 2015-07-25 NOTE — Progress Notes (Signed)
[facility] is continuing to follow pt and can admit pt when medically stable if bed is available.  DNR on chart to be signed.  CSW will continue to follow  Merlyn LotJenna Holoman, Concord HospitalCSWA Clinical Social Worker [phone]

## 2015-07-25 NOTE — Plan of Care (Signed)
Problem: Phase I Progression Outcomes Goal: Initial discharge plan identified Outcome: Progressing Pt lives at home along d/c to SNF Goal: Hemodynamically stable Outcome: Not Progressing Pt BP elevated, given PRN BP medication  Comments:  Pt a/o able to communicate needs and discomforts. Will continue to monitor pt status and provide emotional support as needed

## 2015-07-25 NOTE — Progress Notes (Signed)
PT Cancellation Note  Patient Details Name: Erin Gibson MRN: [id number] DOB: 06/21/1919   Cancelled Treatment:    Reason Eval/Treat Not Completed: Medical issues which prohibited therapy. Pt with elevated BP that had not responded to meds and nurse about to call MD.  Will hold off on PT and check back as schedule permits.   Maria Coin LUBECK 07/25/2015, 10:23 AM

## 2015-07-25 NOTE — Care Management Important Message (Signed)
Important Message  Patient Details  Name: Erin Gibson MRN: [id number] Date of Birth: 04/02/1919   Medicare Important Message Given:  Yes-second notification given    Kyla BalzarineShealy, Braelon Sprung Abena 07/25/2015, 5:27 PM

## 2015-07-25 NOTE — Care Management Note (Addendum)
Case Management Note  Patient Details  Name: Erin Gibson MRN: [id number] Date of Birth: 01/16/1919  Subjective/Objective:  81 y.o. F who is from home alone. Admitted 07/21/2015 for N/V/Syncope. Plan at present is for pt to go to SNF when medically ready for discharge.                  Action/Plan:Discharge to SNF when Medically Stable  Expected Discharge Date:                  Expected Discharge Plan:  Skilled Nursing Facility  In-House Referral:  Clinical Social Work  Discharge planning Services  CM Consult  Post Acute Care Choice:    Choice offered to:     DME Arranged:    DME Agency:     HH Arranged:    HH Agency:     Status of Service:  In process, will continue to follow  Medicare Important Message Given:    Date Medicare IM Given:    Medicare IM give by:    Date Additional Medicare IM Given:    Additional Medicare Important Message give by:     If discussed at Long Length of Stay Meetings, dates discussed:    Additional Comments:CM Will be available for any change in disposition/discharge plans. Barium Swallow 07/25/2015 showed no masses or ulcers. Some decreased Motility. Diflucan, Fungal Esophagitis and Carafate.  Diet advanced to Clear liquids today.    Yvone NeuCrutchfield, Edu On M, RN 07/25/2015, 3:07 PM

## 2015-07-25 NOTE — Progress Notes (Signed)
B/P ranged from 175/96 - 184/89 today and she was given Hydralazine 5 mg IV prn @ 1025.  Dr. Arbutus Leasat informed via text message.  Hydralzine 25 mg po increased from B.I.D to T.I D - 4pm dose administered and medication changes explained to patient.

## 2015-07-25 NOTE — Progress Notes (Signed)
Ms Erin Gibson c/o nausea after eating late clear liquid lunch - refused full liquid dinner.  Zofran 4 mg IV given.

## 2015-07-25 NOTE — Progress Notes (Signed)
The patient's barium swallow was reviewed. No signs of stricture or obstruction. No obvious masses or ulcers. Some decreased motility. She is on a PPI and also Diflucan just in case she might have a fungal esophagitis. I will add Carafate suspension and see if this helps as well. Follow clinically.

## 2015-07-25 NOTE — Progress Notes (Signed)
PROGRESS NOTE  Erin Gibson ZOX:[id number] DOB: 01/30/1919 [name]: 07/21/2015 PCP: Erin Gibson,Erin FRANK, MD  Brief History 81 year old female with a history of hypertension, CKD sensory, normocytic anemia presented with 1 day history of nausea, vomiting, abdominal pain with resultant syncope. The patient states that on 07/21/2015 she developed epigastric and periumbilical abdominal pain that began that morning. She had subsequently 6 episodes of emesis without any blood. On the evening of 07/21/2015, the patient was going back to the bedroom after episode of emesis when she began feeling dizzy and subsequently had a syncopal episode falling onto her bed. When she woke up on her bed, EMS was activated. The patient denies any head injuries. However, she complains of some chest discomfort without any shortness of breath. The patient has not had any vomiting since arrival to the hospital. Her abdominal pain is somewhat better. She also complains of some dysuria which is been going on for at least the past month. The patient lives by herself at home and uses a walker for ambulation. She denies any recent travels or eating any raw or undercooked food. Assessment/Plan: Syncope -Vasovagal in etiology -Remain on telemetry -Continue IV fluids -Orthostatic vital signs negative Acute gastroenteritis -Seems to be improving--no further vomiting since admission -07/21/2015 CT abdomen and pelvis negative for bowel obstruction. No mention of bowel wall thickening. No hydronephrosis. -tolerating solid food without vomiting but with odynophagia -The patient's urinary tract infection may also be contributing -Continue intravenous fluids -Continue PPI for now Dysphagia/odynophagia -barium swallow/esophagram--no stricture or mass, some slow motility--"feels like a knife" -pill vs erosive esophagitis vs fungal -Appreciate GI--added carafate, PPI  To bid -only with solid food -empiric  fluconazole -switch to full liquids Pyuria -Concern for UTI -Continue ceftriaxoneD#5 of 7--unfortunately a culture was not performed Hypertension  -Continue quinapril -Increase hydralazine 25 mg tid CKD stage III  -baseline creatinine 1.0-1.3 Normocytic anemia  -Baseline hemoglobin of 11-12  -Drop in hemoglobin likely dilution  -check iron--iron sat 30%  -B12--255-->replete -RBC folate--pending Right lung nodule  -Incidental finding on CT  -Outpatient surveillance  Deconditioning  -PT evaluation-->SNF   Family Communication: Pt at beside Disposition Plan: SNF in 1-2 days; pending diet toleration        Procedures/Studies: Ct Abdomen Pelvis Wo Contrast  07/21/2015  CLINICAL DATA:  Acute right-sided abdominal pain. EXAM: CT ABDOMEN AND PELVIS WITHOUT CONTRAST TECHNIQUE: Multidetector CT imaging of the abdomen and pelvis was performed following the standard protocol without IV contrast. COMPARISON:  CT scan of March 09, 2013. FINDINGS: Multilevel degenerative disc disease is noted in the lumbar spine. 6 mm nodule is noted posteriorly in the right lower lobe. No gallstones are noted. No focal abnormality is noted in the liver, spleen or pancreas on these unenhanced images. Stable 5.4 cm calcified cyst is seen arising from upper pole of right kidney. Left kidney appears normal. Adrenal glands are not well visualized. Atherosclerosis of abdominal aorta is noted with 3.4 cm infrarenal abdominal aortic aneurysm. There is no evidence of bowel obstruction. No abnormal fluid collection is noted. Urinary bladder appears normal. Mild left inguinal hernia is noted which contains a loop of bowel, but does not result in incarceration or obstruction. No significant adenopathy is noted. IMPRESSION: Stable calcified 5.4 cm cyst is seen arising from upper pole of right kidney. Calcified 3.4 cm infrarenal abdominal aortic aneurysm is noted. Recommend followup by ultrasound in 3 years. This  recommendation follows ACR consensus guidelines: White Paper of  the ACR Incidental Findings Committee II on Vascular Findings. J Am Coll Radiol 2013; 10:789-794. Stable mild left inguinal hernia is noted which contains a loop of bowel, but does not result in incarceration or obstruction. 6 mm nodule is noted in the right lower lobe. If the patient is at high risk for bronchogenic carcinoma, follow-up chest CT at 6-12 months is recommended. If the patient is at low risk for bronchogenic carcinoma, follow-up chest CT at 12 months is recommended. This recommendation follows the consensus statement: Guidelines for Management of Small Pulmonary Nodules Detected on CT Scans: A Statement from the Fleischner Society as published in Radiology 2005;237:395-400. Electronically Signed   By: Lupita Raider, M.D.   On: 07/21/2015 20:37   Dg Abd Acute W/chest  07/21/2015  CLINICAL DATA:  Mid abdominal pain. EXAM: DG ABDOMEN ACUTE W/ 1V CHEST COMPARISON:  Chest x-ray dated 05/08/2015 FINDINGS: Normal heart size and pulmonary vascularity. No focal airspace disease or consolidation in the lungs. No blunting of costophrenic angles. No pneumothorax. Mediastinal contours appear intact. Atherosclerotic disease and tortuosity of the aorta are seen. There is chronic elevation of the left hemidiaphragm. Scattered gas and stool in the colon. No small or large bowel distention. No free intra-abdominal air. No abnormal air-fluid levels. There is a suggestion of wall thickening of several small bowel loops within the lower abdomen. No radiopaque stones. There is a 5.7 cm peripherally calcified structure, which in correlation to prior CT dated 03/09/2013 represents a renal lesion, mostly stable considering cross modality comparison. Visualized bones appear intact. IMPRESSION: No radiographic evidence of acute cardiopulmonary abnormality. Diffuse gaseous distension of the abdomen, without evidence of small-bowel obstruction. Probable small  bowel wall thickening in the lower abdomen. 5.7 cm right renal lesion, mostly stable considering cross modality comparison. Electronically Signed   By: Ted Mcalpine M.D.   On: 07/21/2015 19:01   Dg Esophagus W/water Sol Cm  07/25/2015  CLINICAL DATA:  Odynophagia EXAM: ESOPHOGRAM/BARIUM SWALLOW TECHNIQUE: Single contrast examination was performed using  Omnipaque contrast. FLUOROSCOPY TIME:  Radiation Exposure Index (as provided by the fluoroscopic device): If the device does not provide the exposure index: Fluoroscopy Time:  1 Min and 0 seconds Number of Acquired Images: COMPARISON:  CT abdomen pelvis 07/21/2015 FINDINGS: Accentuated dorsal kyphosis. The patient had limited mobility and was imaged in the left posterior oblique position. Mild decrease in esophageal motility. No obstruction. No stricture or mass lesion. No evidence of perforation or leak. No ulcer or mucosal irregularity identified. Proximal stomach grossly normal IMPRESSION: Mild decrease in esophageal motility. No stricture, mass or perforation of the esophagus. Electronically Signed   By: Marlan Palau M.D.   On: 07/25/2015 13:30         Subjective: Abdominal pain has improved. Patient is no longer having vomiting. She has some intermittent nausea. Still has some odynophagia. Denies any headache, chest pain, sinus breath, fevers, chills, hemoptysis, diarrhea, hematochezia, melena. No abdominal pain.  Objective: Filed Vitals:   07/25/15 1100 07/25/15 1206 07/25/15 1503 07/25/15 1731  BP: 185/74 175/96 182/80 149/93  Pulse:    103  Temp:    99.3 F (37.4 C)  TempSrc:    Oral  Resp:    18  Height:      Weight:      SpO2:        Intake/Output Summary (Last 24 hours) at 07/25/15 1817 Last data filed at 07/25/15 1801  Gross per 24 hour  Intake 1370.83 ml  Output  2375 ml  Net -1004.17 ml   Weight change: 0.408 kg (14.4 oz) Exam:   General:  Pt is alert, follows commands appropriately, not in acute  distress  HEENT: No icterus, No thrush, No neck mass, [place]/AT  Cardiovascular: RRR, S1/S2, no rubs, no gallops  Respiratory: CTA bilaterally, no wheezing, no crackles, no rhonchi  Abdomen: Soft/+BS, non tender, non distended, no guarding; no hepatosplenomegaly  Extremities: No edema, No lymphangitis, No petechiae, No rashes, no synovitis; no cyanosis or clubbing  Data Reviewed: Basic Metabolic Panel:  Recent Labs Lab 07/21/15 1835 07/22/15 0342 07/23/15 0420  NA 143 143 144  K 4.8 4.2 3.8  CL 109 110 112*  CO2 GLUCOSE 111* 89 86  BUN 30* 28* 19  CREATININE 1.34* 1.20* 1.09*  CALCIUM 10.0 8.9 9.1   Liver Function Tests:  Recent Labs Lab 07/21/15 1835 07/22/15 0342  AST 26 20  ALT 16 13*  ALKPHOS 118 93  BILITOT 1.0 0.6  PROT 6.2* 4.6*  ALBUMIN 3.6 2.5*    Recent Labs Lab 07/21/15 1835  LIPASE 58*   No results for input(s): AMMONIA in the last 168 hours. CBC:  Recent Labs Lab 07/21/15 1835 07/22/15 0342 07/22/15 0645 07/23/15 0420  WBC 9.8 7.1 6.5 5.1  NEUTROABS  --   --  4.5  --   HGB 13.1 9.8* 10.8* 10.4*  HCT 40.4 29.6* 33.3* 31.5*  MCV 91.4 90.8 92.8 91.8  PLT 192 160 156 165   Cardiac Enzymes: No results for input(s): CKTOTAL, CKMB, CKMBINDEX, TROPONINI in the last 168 hours. BNP: Invalid input(s): POCBNP CBG:  Recent Labs Lab 07/22/15 0601 07/23/15 0533 07/24/15 0600 07/25/15 0622  GLUCAP 77 83 81 81    No results found for this or any previous visit (from the past 240 hour(s)).   Scheduled Meds: . aspirin EC  81 mg Oral q morning - 10a  . cefTRIAXone (ROCEPHIN)  IV  1 g Intravenous Q24H  . fluconazole  100 mg Oral Daily  . heparin  5,000 Units Subcutaneous 3 times per day  . hydrALAZINE  25 mg Oral 3 times per day  . pantoprazole (PROTONIX) IV  40 mg Intravenous Q12H  . quinapril  40 mg Oral Daily  . sodium chloride  3 mL Intravenous Q12H  . sucralfate  1 g Oral TID WC & HS  . vitamin B-12  500 mcg Oral Daily    Continuous Infusions: . sodium chloride 50 mL/hr at 07/23/15 1757     Elaf Clauson, DO  [facility] Pager [phone]  If 7PM-7AM, please contact night-coverage [email or web address] Password [email or web address] 07/25/2015, 6:17 PM   LOS: 3 days

## 2015-07-26 DIAGNOSIS — I10 Essential (primary) hypertension: Secondary | ICD-10-CM | POA: Diagnosis not present

## 2015-07-26 DIAGNOSIS — R1314 Dysphagia, pharyngoesophageal phase: Secondary | ICD-10-CM | POA: Diagnosis not present

## 2015-07-26 DIAGNOSIS — D649 Anemia, unspecified: Secondary | ICD-10-CM | POA: Diagnosis not present

## 2015-07-26 DIAGNOSIS — R112 Nausea with vomiting, unspecified: Secondary | ICD-10-CM | POA: Diagnosis not present

## 2015-07-26 DIAGNOSIS — R1310 Dysphagia, unspecified: Secondary | ICD-10-CM | POA: Diagnosis not present

## 2015-07-26 DIAGNOSIS — M150 Primary generalized (osteo)arthritis: Secondary | ICD-10-CM | POA: Diagnosis not present

## 2015-07-26 DIAGNOSIS — N183 Chronic kidney disease, stage 3 (moderate): Secondary | ICD-10-CM | POA: Diagnosis not present

## 2015-07-26 DIAGNOSIS — S72101A Unspecified trochanteric fracture of right femur, initial encounter for closed fracture: Secondary | ICD-10-CM | POA: Diagnosis not present

## 2015-07-26 DIAGNOSIS — R1311 Dysphagia, oral phase: Secondary | ICD-10-CM | POA: Diagnosis not present

## 2015-07-26 DIAGNOSIS — K209 Esophagitis, unspecified: Secondary | ICD-10-CM | POA: Diagnosis not present

## 2015-07-26 DIAGNOSIS — H43813 Vitreous degeneration, bilateral: Secondary | ICD-10-CM | POA: Diagnosis not present

## 2015-07-26 DIAGNOSIS — R29898 Other symptoms and signs involving the musculoskeletal system: Secondary | ICD-10-CM | POA: Diagnosis not present

## 2015-07-26 DIAGNOSIS — H353123 Nonexudative age-related macular degeneration, left eye, advanced atrophic without subfoveal involvement: Secondary | ICD-10-CM | POA: Diagnosis not present

## 2015-07-26 DIAGNOSIS — N390 Urinary tract infection, site not specified: Secondary | ICD-10-CM | POA: Diagnosis not present

## 2015-07-26 DIAGNOSIS — K5289 Other specified noninfective gastroenteritis and colitis: Secondary | ICD-10-CM | POA: Diagnosis not present

## 2015-07-26 DIAGNOSIS — H353211 Exudative age-related macular degeneration, right eye, with active choroidal neovascularization: Secondary | ICD-10-CM | POA: Diagnosis not present

## 2015-07-26 DIAGNOSIS — I2510 Atherosclerotic heart disease of native coronary artery without angina pectoris: Secondary | ICD-10-CM | POA: Diagnosis not present

## 2015-07-26 DIAGNOSIS — I83019 Varicose veins of right lower extremity with ulcer of unspecified site: Secondary | ICD-10-CM | POA: Diagnosis not present

## 2015-07-26 DIAGNOSIS — M6281 Muscle weakness (generalized): Secondary | ICD-10-CM | POA: Diagnosis not present

## 2015-07-26 DIAGNOSIS — R2689 Other abnormalities of gait and mobility: Secondary | ICD-10-CM | POA: Diagnosis not present

## 2015-07-26 DIAGNOSIS — R1319 Other dysphagia: Secondary | ICD-10-CM | POA: Diagnosis not present

## 2015-07-26 DIAGNOSIS — D500 Iron deficiency anemia secondary to blood loss (chronic): Secondary | ICD-10-CM | POA: Diagnosis not present

## 2015-07-26 DIAGNOSIS — R55 Syncope and collapse: Secondary | ICD-10-CM | POA: Diagnosis not present

## 2015-07-26 DIAGNOSIS — G9001 Carotid sinus syncope: Secondary | ICD-10-CM | POA: Diagnosis not present

## 2015-07-26 LAB — BASIC METABOLIC PANEL
ANION GAP: 8 (ref 5–15)
BUN: 12 mg/dL (ref 6–20)
CALCIUM: 8.5 mg/dL — AB (ref 8.9–10.3)
CHLORIDE: 107 mmol/L (ref 101–111)
CO2: 23 mmol/L (ref 22–32)
Creatinine, Ser: 1.17 mg/dL — ABNORMAL HIGH (ref 0.44–1.00)
GFR calc non Af Amer: 39 mL/min — ABNORMAL LOW (ref >60)
GFR, EST AFRICAN AMERICAN: 45 mL/min — AB (ref >60)
GLUCOSE: 74 mg/dL (ref 65–99)
POTASSIUM: 3.4 mmol/L — AB (ref 3.5–5.1)
Sodium: 138 mmol/L (ref 135–145)

## 2015-07-26 LAB — GLUCOSE, CAPILLARY: GLUCOSE-CAPILLARY: 73 mg/dL (ref 65–99)

## 2015-07-26 LAB — CBC
HEMATOCRIT: 32.3 % — AB (ref 36.0–46.0)
HEMOGLOBIN: 10.7 g/dL — AB (ref 12.0–15.0)
MCH: 30.4 pg (ref 26.0–34.0)
MCHC: 33.1 g/dL (ref 30.0–36.0)
MCV: 91.8 fL (ref 78.0–100.0)
Platelets: 152 10*3/uL (ref 150–400)
RBC: 3.52 MIL/uL — AB (ref 3.87–5.11)
RDW: 14.7 % (ref 11.5–15.5)
WBC: 5.3 10*3/uL (ref 4.0–10.5)

## 2015-07-26 MED ORDER — OMEPRAZOLE 40 MG PO CPDR: 40.0000 mg | DELAYED_RELEASE_CAPSULE | Freq: Two times a day (BID) | ORAL | Status: DC

## 2015-07-26 MED ORDER — FLUCONAZOLE 100 MG PO TABS: 100.0000 mg | ORAL_TABLET | Freq: Every day | ORAL | Status: DC

## 2015-07-26 MED ORDER — POTASSIUM CHLORIDE CRYS ER 10 MEQ PO TBCR
10.0000 meq | EXTENDED_RELEASE_TABLET | Freq: Once | ORAL | Status: AC
  Administered 2015-07-26: 10 meq via ORAL
  Filled 2015-07-26: qty 1

## 2015-07-26 MED ORDER — PANTOPRAZOLE SODIUM 40 MG PO TBEC
40.0000 mg | DELAYED_RELEASE_TABLET | Freq: Two times a day (BID) | ORAL | Status: DC
  Administered 2015-07-26: 40 mg via ORAL
  Filled 2015-07-26: qty 1

## 2015-07-26 MED ORDER — SUCRALFATE 1 GM/10ML PO SUSP: 1.0000 g | Freq: Three times a day (TID) | ORAL | Status: DC

## 2015-07-26 MED ORDER — HYDRALAZINE HCL 25 MG PO TABS: 25.0000 mg | ORAL_TABLET | Freq: Three times a day (TID) | ORAL | Status: DC

## 2015-07-26 NOTE — Progress Notes (Signed)
She seems to be swallowing better today. I would recommend advancing diet as tolerated. Nothing further to add at this point. We will sign off. Call us if needed.

## 2015-07-26 NOTE — Discharge Summary (Addendum)
Physician Discharge Summary  LENEA BYWATER OZH:[id number] DOB: 12/17/1918 [name]: 07/21/2015  PCP: Rogelia Boga, MD  Admit date: 07/21/2015 Discharge date: 07/26/2015  Recommendations for Outpatient Follow-up:  1. Pt will need to follow up with PCP in 2 weeks post discharge 2. Please obtain BMP in one week Discharge Diagnoses:  Syncope -Vasovagal in etiology -Remain on telemetry -Continue IV fluids during the hospitalization -Orthostatic vital signs negative -The patient symptomatically improved after intravenous fluids Acute gastroenteritis -Seems to be improving--no further vomiting since admission -07/21/2015 CT abdomen and pelvis negative for bowel obstruction. No mention of bowel wall thickening. No hydronephrosis. -tolerating solid food without vomiting but with odynophagia -The patient's urinary tract infection may also be contributing -Continue intravenous fluids  Dysphagia/odynophagia -barium swallow/esophagram--no stricture or mass, some slow motility- -"feels like a knife" -pill vs erosive esophagitis vs fungal -Appreciate GI--added carafate, PPIto bid -only with solid food -empiric fluconazole--10 more days to finish 14 days tx -switch to full liquids-->advanced to soft diet which she tolerated -Continue PPI for now--discharge patient with omeprazole 40 mg po bid -d/c with carafate slurry q ac and hs for one month -With fluconazole, PPI twice a day, Carafate, the patient's odynophagia gradually improved Pyuria -Concern for UTI -Continue ceftriaxone--finished 6 days--unfortunately a culture was not performed Hypertension  -Continue quinapril -Increase hydralazine 25 mg tid CKD stage III  -baseline creatinine 1.0-1.3 -Serum creatinine 1.17 on day of discharge Normocytic anemia  -Baseline hemoglobin of 11-12  -Drop in hemoglobin likely dilution  -check iron--iron sat 30%  -B12--255-->replete -RBC folate--pending Right lung nodule   -Incidental finding on CT  -Outpatient surveillance  Deconditioning  -PT evaluation-->SNF  Discharge Condition: stable  Disposition: SNF  Diet:soft Wt Readings from Last 3 Encounters:  07/26/15 47.718 kg (105 lb 3.2 oz)  02/08/15 47.174 kg (104 lb)  08/10/14 48.081 kg (106 lb)    History of present illness:  81 year old female with a history of hypertension, CKD sensory, normocytic anemia presented with 1 day history of nausea, vomiting, abdominal pain with resultant syncope. The patient states that on 07/21/2015 she developed epigastric and periumbilical abdominal pain that began that morning. She had subsequently 6 episodes of emesis without any blood. On the evening of 07/21/2015, the patient was going back to the bedroom after episode of emesis when she began feeling dizzy and subsequently had a syncopal episode falling onto her bed. When she woke up on her bed, EMS was activated. The patient denies any head injuries. However, she complains of some chest discomfort without any shortness of breath. The patient has not had any vomiting since arrival to the hospital. Her abdominal pain is somewhat better. She also complains of some dysuria which is been going on for at least the past month. The patient lives by herself at home and uses a walker for ambulation. She denies any recent travels or eating any raw or undercooked food. The patient was started on intravenous fluids and treated conservatively. Her abdominal pain and vomiting improved. However the patient began having odynophagia with solid foods. The patient was switched to liquid diet. An esophagram was performed and was negative for any stricture or mass.  GI was consulted and recommended conservative therapy without EGD. Unfortunately, the patient gradually improved. Her diet was gradually advanced which she tolerated.    Consultants: [facility]  Discharge Exam: Filed Vitals:   07/26/15 1100  BP: 131/58  Pulse:   Temp:  98.2 F (36.8 C)  Resp:    Filed Vitals:  07/25/15 1731 07/25/15 2027 07/26/15 0453 07/26/15 1100  BP: 149/93 142/60 130/50 131/58  Pulse: 103 85 82   Temp: 99.3 F (37.4 C) 98.9 F (37.2 C) 98.2 F (36.8 C) 98.2 F (36.8 C)  TempSrc: Oral Oral Oral Oral  Resp: 18 18 18    Height:      Weight:   47.718 kg (105 lb 3.2 oz)   SpO2:  96% 94% 97%   General: A&O x 3, NAD, pleasant, cooperative Cardiovascular: RRR, no rub, no gallop, no S3 Respiratory: CTAB, no wheeze, no rhonchi Abdomen:soft, nontender, nondistended, positive bowel sounds Extremities: No edema, No lymphangitis, no petechiae  Discharge Instructions      Discharge Instructions    Increase activity slowly    Complete by:  As directed             Medication List    STOP taking these medications        diclofenac 50 MG EC tablet  Commonly known as:  VOLTAREN      TAKE these medications        Artificial Tear Gel  Place 2 drops into both eyes 2 (two) times daily.     aspirin EC 81 MG tablet  Take 81 mg by mouth every morning.     CALCIUM 600 + D PO  Take 1 tablet by mouth every morning.     fluconazole 100 MG tablet  Commonly known as:  DIFLUCAN  Take 1 tablet (100 mg total) by mouth daily.     furosemide 40 MG tablet  Commonly known as:  LASIX  TAKE 1/2 TABLET (=20MG )    DAILY     hydrALAZINE 25 MG tablet  Commonly known as:  APRESOLINE  Take 1 tablet (25 mg total) by mouth every 8 (eight) hours.     meclizine 25 MG tablet  Commonly known as:  ANTIVERT  TAKE 1 TABLET 3 TIMES A DAYAS NEEDED FOR DIZZINESS     nitroGLYCERIN 0.4 MG SL tablet  Commonly known as:  NITROSTAT  Place 0.4 mg under the tongue every 5 (five) minutes as needed for chest pain.     omeprazole 40 MG capsule  Commonly known as:  PRILOSEC  Take 1 capsule (40 mg total) by mouth 2 (two) times daily.     polyethylene glycol powder powder  Commonly known as:  GLYCOLAX/MIRALAX  MIX 17GM IN LIQUID AND     DRINK TWO TIMES  A DAY     potassium chloride SA 20 MEQ tablet  Commonly known as:  K-DUR,KLOR-CON  Take 1 tablet (20 mEq total) by mouth daily.     quinapril 40 MG tablet  Commonly known as:  ACCUPRIL  Take 1 tablet (40 mg total) by mouth daily.     sucralfate 1 GM/10ML suspension  Commonly known as:  CARAFATE  Take 10 mLs (1 g total) by mouth 4 (four) times daily -  with meals and at bedtime.     Vitamin B-12 2500 MCG Subl  Place 1 tablet under the tongue every morning.     vitamin E 400 UNIT capsule  Generic drug:  vitamin E  Take 400 Units by mouth every morning.         The results of significant diagnostics from this hospitalization (including imaging, microbiology, ancillary and laboratory) are listed below for reference.    Significant Diagnostic Studies: Ct Abdomen Pelvis Wo Contrast  07/21/2015  CLINICAL DATA:  Acute right-sided abdominal pain. EXAM: CT ABDOMEN AND PELVIS  WITHOUT CONTRAST TECHNIQUE: Multidetector CT imaging of the abdomen and pelvis was performed following the standard protocol without IV contrast. COMPARISON:  CT scan of March 09, 2013. FINDINGS: Multilevel degenerative disc disease is noted in the lumbar spine. 6 mm nodule is noted posteriorly in the right lower lobe. No gallstones are noted. No focal abnormality is noted in the liver, spleen or pancreas on these unenhanced images. Stable 5.4 cm calcified cyst is seen arising from upper pole of right kidney. Left kidney appears normal. Adrenal glands are not well visualized. Atherosclerosis of abdominal aorta is noted with 3.4 cm infrarenal abdominal aortic aneurysm. There is no evidence of bowel obstruction. No abnormal fluid collection is noted. Urinary bladder appears normal. Mild left inguinal hernia is noted which contains a loop of bowel, but does not result in incarceration or obstruction. No significant adenopathy is noted. IMPRESSION: Stable calcified 5.4 cm cyst is seen arising from upper pole of right kidney.  Calcified 3.4 cm infrarenal abdominal aortic aneurysm is noted. Recommend followup by ultrasound in 3 years. This recommendation follows ACR consensus guidelines: White Paper of the ACR Incidental Findings Committee II on Vascular Findings. J Am Coll Radiol 2013; 10:789-794. Stable mild left inguinal hernia is noted which contains a loop of bowel, but does not result in incarceration or obstruction. 6 mm nodule is noted in the right lower lobe. If the patient is at high risk for bronchogenic carcinoma, follow-up chest CT at 6-12 months is recommended. If the patient is at low risk for bronchogenic carcinoma, follow-up chest CT at 12 months is recommended. This recommendation follows the consensus statement: Guidelines for Management of Small Pulmonary Nodules Detected on CT Scans: A Statement from the Fleischner Society as published in Radiology 2005;237:395-400. Electronically Signed   By: Lupita Raider, M.D.   On: 07/21/2015 20:37   Dg Abd Acute W/chest  07/21/2015  CLINICAL DATA:  Mid abdominal pain. EXAM: DG ABDOMEN ACUTE W/ 1V CHEST COMPARISON:  Chest x-ray dated 05/08/2015 FINDINGS: Normal heart size and pulmonary vascularity. No focal airspace disease or consolidation in the lungs. No blunting of costophrenic angles. No pneumothorax. Mediastinal contours appear intact. Atherosclerotic disease and tortuosity of the aorta are seen. There is chronic elevation of the left hemidiaphragm. Scattered gas and stool in the colon. No small or large bowel distention. No free intra-abdominal air. No abnormal air-fluid levels. There is a suggestion of wall thickening of several small bowel loops within the lower abdomen. No radiopaque stones. There is a 5.7 cm peripherally calcified structure, which in correlation to prior CT dated 03/09/2013 represents a renal lesion, mostly stable considering cross modality comparison. Visualized bones appear intact. IMPRESSION: No radiographic evidence of acute cardiopulmonary  abnormality. Diffuse gaseous distension of the abdomen, without evidence of small-bowel obstruction. Probable small bowel wall thickening in the lower abdomen. 5.7 cm right renal lesion, mostly stable considering cross modality comparison. Electronically Signed   By: Ted Mcalpine M.D.   On: 07/21/2015 19:01   Dg Esophagus W/water Sol Cm  07/25/2015  CLINICAL DATA:  Odynophagia EXAM: ESOPHOGRAM/BARIUM SWALLOW TECHNIQUE: Single contrast examination was performed using  Omnipaque contrast. FLUOROSCOPY TIME:  Radiation Exposure Index (as provided by the fluoroscopic device): If the device does not provide the exposure index: Fluoroscopy Time:  1 Min and 0 seconds Number of Acquired Images: COMPARISON:  CT abdomen pelvis 07/21/2015 FINDINGS: Accentuated dorsal kyphosis. The patient had limited mobility and was imaged in the left posterior oblique position. Mild decrease in esophageal motility. No  obstruction. No stricture or mass lesion. No evidence of perforation or leak. No ulcer or mucosal irregularity identified. Proximal stomach grossly normal IMPRESSION: Mild decrease in esophageal motility. No stricture, mass or perforation of the esophagus. Electronically Signed   By: Marlan Palau M.D.   On: 07/25/2015 13:30     Microbiology: No results found for this or any previous visit (from the past 240 hour(s)).   Labs: Basic Metabolic Panel:  Recent Labs Lab 07/21/15 1835 07/22/15 0342 07/23/15 0420 07/26/15 0401  NA 143 143 144 138  K 4.8 4.2 3.8 3.4*  CL 109 110 112* 107  CO2 24 24 23 23   GLUCOSE 111* 89 86 74  BUN 30* 28* 19 12  CREATININE 1.34* 1.20* 1.09* 1.17*  CALCIUM 10.0 8.9 9.1 8.5*   Liver Function Tests:  Recent Labs Lab 07/21/15 1835 07/22/15 0342  AST 26 20  ALT 16 13*  ALKPHOS 118 93  BILITOT 1.0 0.6  PROT 6.2* 4.6*  ALBUMIN 3.6 2.5*    Recent Labs Lab 07/21/15 1835  LIPASE 58*   No results for input(s): AMMONIA in the last 168 hours. CBC:  Recent  Labs Lab 07/21/15 1835 07/22/15 0342 07/22/15 0645 07/23/15 0420 07/26/15 0401  WBC 9.8 7.1 6.5 5.1 5.3  NEUTROABS  --   --  4.5  --   --   HGB 13.1 9.8* 10.8* 10.4* 10.7*  HCT 40.4 29.6* 33.3* 31.5* 32.3*  MCV 91.4 90.8 92.8 91.8 91.8  PLT 192 160 156 165 152   Cardiac Enzymes: No results for input(s): CKTOTAL, CKMB, CKMBINDEX, TROPONINI in the last 168 hours. BNP: Invalid input(s): POCBNP CBG:  Recent Labs Lab 07/22/15 0601 07/23/15 0533 07/24/15 0600 07/25/15 0622 07/26/15 0637  GLUCAP 77 83 81 81 73    Time coordinating discharge:  Greater than 30 minutes  Signed:  Ashyr Hedgepath, DO [facility] Pager: [phone] 07/26/2015, 12:11 PM

## 2015-07-26 NOTE — Progress Notes (Signed)
Pt d/c via [facility]. Pt has all her belongings including dentures and glasses

## 2015-07-31 DIAGNOSIS — G9001 Carotid sinus syncope: Secondary | ICD-10-CM | POA: Diagnosis not present

## 2015-07-31 DIAGNOSIS — R112 Nausea with vomiting, unspecified: Secondary | ICD-10-CM | POA: Diagnosis not present

## 2015-07-31 DIAGNOSIS — I10 Essential (primary) hypertension: Secondary | ICD-10-CM | POA: Diagnosis not present

## 2015-07-31 DIAGNOSIS — K209 Esophagitis, unspecified: Secondary | ICD-10-CM | POA: Diagnosis not present

## 2015-07-31 DIAGNOSIS — R2689 Other abnormalities of gait and mobility: Secondary | ICD-10-CM | POA: Diagnosis not present

## 2015-07-31 DIAGNOSIS — I2510 Atherosclerotic heart disease of native coronary artery without angina pectoris: Secondary | ICD-10-CM | POA: Diagnosis not present

## 2015-08-03 NOTE — Clinical Social Work Placement (Addendum)
   CLINICAL SOCIAL WORK PLACEMENT  NOTE  Date:  08/03/2015  Patient Details  Name: Erin Gibson MRN: [id number] Date of Birth: 01/30/1919  Clinical Social Work is seeking post-discharge placement for this patient at the Skilled  Nursing Facility level of care (*CSW will initial, date and re-position this form in  chart as items are completed):  Yes   Patient/family provided with [facility] Clinical Social Work Department's list of facilities offering this level of care within the geographic area requested by the patient (or if unable, by the patient's family).  Yes   Patient/family informed of their freedom to choose among providers that offer the needed level of care, that participate in Medicare, Medicaid or managed care program needed by the patient, have an available bed and are willing to accept the patient.  Yes   Patient/family informed of [facility] ownership interest in [facility] and [facility], as well as of the fact that they are under no obligation to receive care at these facilities.  PASRR submitted to EDS on 07/22/15     PASRR number received on 07/22/15     Existing PASRR number confirmed on       FL2 transmitted to all facilities in geographic area requested by pt/family on 07/22/15     FL2 transmitted to all facilities within larger geographic area on       Patient informed that his/her managed care company has contracts with or will negotiate with certain facilities, including the following:        Yes   Patient/family informed of bed offers received.  Patient chooses bed at [facility] [place]     Physician recommends and patient chooses bed at      Patient to be transferred to [facility] [place] [facility] on 07/26/15.  Patient to be transferred to facility by Ambulance  Sharin Mons([facility])     Patient family notified on 07/26/15 of transfer.  Name of family member notified:  Tana ConchGail Sissel - Daughter     PHYSICIAN Please prepare priority discharge  summary, including medications, Please prepare prescriptions, Please sign FL2     Additional Comment:  Ok per MD for d/c today to SNF for rehab.  Daughter and patient are agreeable to d/c and denied any concerns or questions. Nursing notified to call report.  DC summary sent to facility and EMS certification prepared. No further CSW needs identified.  CSW signing off.  Lorri Frederickonna T. Andria RheinCrowder, LCSW [phone]      _______________________________________________ Darylene Pricerowder, [name], LCSW 07/26/2015 , 3:24 PM

## 2015-08-09 DIAGNOSIS — H353123 Nonexudative age-related macular degeneration, left eye, advanced atrophic without subfoveal involvement: Secondary | ICD-10-CM | POA: Diagnosis not present

## 2015-08-09 DIAGNOSIS — H43813 Vitreous degeneration, bilateral: Secondary | ICD-10-CM | POA: Diagnosis not present

## 2015-08-09 DIAGNOSIS — H353211 Exudative age-related macular degeneration, right eye, with active choroidal neovascularization: Secondary | ICD-10-CM | POA: Diagnosis not present

## 2015-08-16 ENCOUNTER — Encounter: Payer: Federal, State, Local not specified - PPO | Admitting: Internal Medicine

## 2015-08-22 ENCOUNTER — Telehealth: Payer: Self-pay | Admitting: Internal Medicine

## 2015-08-22 NOTE — Telephone Encounter (Signed)
Erin HomesGayle, Ms. Previti's daughter and POA called saying when Erin Gibson is released from Rehab, [place]. Both Erin Gibson and Erin Gibson want to make sure her Rx's can be mailed to her new address ([address] [place], [place] [id number]) once she moves. Her phone number in [place] will be: [phone]. Erin Gibson will call when she moves.  In addition, she needs her handicap sticker paperwork filled out. Gayle plans to stop by and drop it off to be filled out if it's ok. If you have any questions or concerns, please feel free to call Erin Gibson at: [phone] Thank you.

## 2015-08-25 NOTE — Telephone Encounter (Signed)
Spoke to Erin Gibson pt's daughter, asked her if pt is moving to [place] permanently? Erin Gibson so no maybe just for the winter. Told her okay just let me know when you need Rx's. Erin Gibson verbalized understanding and said pt has appt on Dec 20th but not sure if she will make it. Pt is still in Rehab. Told her that if fine it can't make just call and cancel. Erin Gibson verbalized understanding. Asked Erin Gibson if she dropped off Handicap paper? She said no is bringing it by today. Told her okay just ask for Erin Gibson and I will come out and get it. Erin Gibson verbalized understanding.

## 2015-08-26 NOTE — Telephone Encounter (Signed)
Received paperwork for Handicap sticker, given to Dr. Kirtland BouchardK.

## 2015-08-26 NOTE — Telephone Encounter (Signed)
Paperwork filled out by Dr. Kirtland BouchardK and put in mail to pt as requested.

## 2015-09-09 ENCOUNTER — Encounter: Payer: Federal, State, Local not specified - PPO | Admitting: Internal Medicine

## 2015-09-13 ENCOUNTER — Ambulatory Visit (INDEPENDENT_AMBULATORY_CARE_PROVIDER_SITE_OTHER): Payer: Medicare Other | Admitting: Internal Medicine

## 2015-09-13 ENCOUNTER — Encounter: Payer: Self-pay | Admitting: Internal Medicine

## 2015-09-13 VITALS — BP 130/66 | HR 78 | Temp 98.3°F | Resp 18 | Ht 58.75 in | Wt 108.0 lb

## 2015-09-13 DIAGNOSIS — I10 Essential (primary) hypertension: Secondary | ICD-10-CM | POA: Diagnosis not present

## 2015-09-13 DIAGNOSIS — M150 Primary generalized (osteo)arthritis: Secondary | ICD-10-CM | POA: Diagnosis not present

## 2015-09-13 DIAGNOSIS — L97919 Non-pressure chronic ulcer of unspecified part of right lower leg with unspecified severity: Secondary | ICD-10-CM

## 2015-09-13 DIAGNOSIS — M159 Polyosteoarthritis, unspecified: Secondary | ICD-10-CM

## 2015-09-13 DIAGNOSIS — I83019 Varicose veins of right lower extremity with ulcer of unspecified site: Secondary | ICD-10-CM | POA: Diagnosis not present

## 2015-09-13 DIAGNOSIS — D500 Iron deficiency anemia secondary to blood loss (chronic): Secondary | ICD-10-CM | POA: Diagnosis not present

## 2015-09-13 LAB — CBC WITH DIFFERENTIAL/PLATELET
BASOS PCT: 0.6 % (ref 0.0–3.0)
Basophils Absolute: 0 10*3/uL (ref 0.0–0.1)
EOS PCT: 1.8 % (ref 0.0–5.0)
Eosinophils Absolute: 0.1 10*3/uL (ref 0.0–0.7)
HEMATOCRIT: 37.5 % (ref 36.0–46.0)
HEMOGLOBIN: 12.1 g/dL (ref 12.0–15.0)
LYMPHS PCT: 20.3 % (ref 12.0–46.0)
Lymphs Abs: 1 10*3/uL (ref 0.7–4.0)
MCHC: 32.4 g/dL (ref 30.0–36.0)
MCV: 91.7 fl (ref 78.0–100.0)
MONO ABS: 0.5 10*3/uL (ref 0.1–1.0)
MONOS PCT: 9.8 % (ref 3.0–12.0)
Neutro Abs: 3.4 10*3/uL (ref 1.4–7.7)
Neutrophils Relative %: 67.5 % (ref 43.0–77.0)
Platelets: 222 10*3/uL (ref 150.0–400.0)
RBC: 4.08 Mil/uL (ref 3.87–5.11)
RDW: 13.3 % (ref 11.5–15.5)
WBC: 5 10*3/uL (ref 4.0–10.5)

## 2015-09-13 LAB — COMPREHENSIVE METABOLIC PANEL
ALBUMIN: 3.7 g/dL (ref 3.5–5.2)
ALK PHOS: 100 U/L (ref 39–117)
ALT: 10 U/L (ref 0–35)
AST: 16 U/L (ref 0–37)
BUN: 54 mg/dL — ABNORMAL HIGH (ref 6–23)
CALCIUM: 10 mg/dL (ref 8.4–10.5)
CHLORIDE: 107 meq/L (ref 96–112)
CO2: 30 mEq/L (ref 19–32)
Creatinine, Ser: 1.17 mg/dL (ref 0.40–1.20)
GFR: 45.69 mL/min — AB (ref >60.00)
Glucose, Bld: 97 mg/dL (ref 70–99)
POTASSIUM: 4.2 meq/L (ref 3.5–5.1)
Sodium: 145 mEq/L (ref 135–145)
TOTAL PROTEIN: 6.7 g/dL (ref 6.0–8.3)
Total Bilirubin: 0.7 mg/dL (ref 0.2–1.2)

## 2015-09-13 NOTE — Progress Notes (Signed)
Pre visit review using our clinic review tool, if applicable. No additional management support is needed unless otherwise documented below in the visit note. 

## 2015-09-13 NOTE — Patient Instructions (Signed)
Limit your sodium (Salt) intake  Please check your blood pressure on a regular basis.  If it is consistently greater than 150/90, please make an office appointment.  Return in 4 months for follow-up  

## 2015-09-13 NOTE — Progress Notes (Signed)
Patient ID: Erin Gibson, female   DOB: [birth year], 81 y.o.   MRN: [id number] Patient ID: Erin Gibson, female   DOB: 1918-12-06, 81 y.o.   MRN: [id number]  Subjective:    Patient ID: Erin Gibson, female    DOB: 07-02-1919, 81 y.o.   MRN: [id number]  HPI  81 year old patient who is had a recent hospital admission.   Allergies: Admit date: 07/21/2015 Discharge date: 07/26/2015  Recommendations for Outpatient Follow-up:  1. Pt will need to follow up with PCP in 2 weeks post discharge 2. Please obtain BMP in one week Discharge Diagnoses:  Syncope -Vasovagal in etiology -Remain on telemetry -Continue IV fluids during the hospitalization -Orthostatic vital signs negative -The patient symptomatically improved after intravenous fluids Acute gastroenteritis -Seems to be improving--no further vomiting since admission -07/21/2015 CT abdomen and pelvis negative for bowel obstruction. No mention of bowel wall thickening. No hydronephrosis. -tolerating solid food without vomiting but with odynophagia -The patient's urinary tract infection may also be contributing -Continue intravenous fluids  Dysphagia/odynophagia -barium swallow/esophagram--no stricture or mass, some slow motility- -"feels like a knife" -pill vs erosive esophagitis vs fungal -Appreciate GI--added carafate, PPIto bid -only with solid food -empiric fluconazole--10 more days to finish 14 days tx -switch to full liquids-->advanced to soft diet which she tolerated -Continue PPI for now--discharge patient with omeprazole 40 mg po bid -d/c with carafate slurry q ac and hs for one month -With fluconazole, PPI twice a day, Carafate, the patient's odynophagia gradually improved Pyuria -Concern for UTI -Continue ceftriaxone--finished 6 days--unfortunately a culture was not performed Hypertension  -Continue quinapril -Increase hydralazine 25 mg tid CKD stage III  -baseline creatinine 1.0-1.3 -Serum creatinine 1.17 on day  of discharge Normocytic anemia  -Baseline hemoglobin of 11-12  -Drop in hemoglobin likely dilution  -check iron--iron sat 30%  -B12--255-->replete -RBC folate--pending Right lung nodule  -Incidental finding on CT  -Outpatient surveillance  Deconditioning  -PT evaluation-->SNF  She [facility].  She is scheduled for discharge on December 30 and she has read strong about not being home for Christmas.  She has been living independently and plans to return home.  Today she is accompanied by her daughter who is quite concerned about living independently and driving. Presently the patient is wheelchair bound but hopeful to transition to a walker  Review of Systems  Constitutional: Positive for fatigue. Negative for fever, appetite change and unexpected weight change.  HENT: Negative for congestion, dental problem, ear pain, hearing loss, mouth sores, nosebleeds, sinus pressure, sore throat, tinnitus, trouble swallowing and voice change.        Decreased hearing left ear  Eyes: Positive for visual disturbance. Negative for photophobia, pain and redness.  Respiratory: Negative for cough, chest tightness, shortness of breath and wheezing.   Cardiovascular: Negative for chest pain, palpitations and leg swelling.  Gastrointestinal: Negative for nausea, vomiting, abdominal pain, diarrhea, constipation, blood in stool, abdominal distention and rectal pain.  Genitourinary: Negative for dysuria, urgency, frequency, hematuria, flank pain, vaginal bleeding, vaginal discharge, difficulty urinating, genital sores, vaginal pain, menstrual problem and pelvic pain.  Musculoskeletal: Positive for back pain and gait problem. Negative for arthralgias and neck stiffness.  Skin: Negative for rash.  Neurological: Positive for weakness. Negative for dizziness, syncope, speech difficulty, light-headedness, numbness and headaches.  Hematological:  Negative for adenopathy. Does not bruise/bleed easily.  Psychiatric/Behavioral: Negative for suicidal ideas, behavioral problems, self-injury, dysphoric mood and agitation. The patient is not nervous/anxious.  Objective:   Physical Exam  Constitutional: She is oriented to person, place, and time. She appears well-developed and well-nourished. No distress.  Blood pressure 130/ 66 Alert elderly, frail Wheelchair bound, afebrile   HENT:  Head: Normocephalic.  Right Ear: External ear normal.  Left Ear: External ear normal.  Mouth/Throat: Oropharynx is clear and moist.  Diminished hearing left ear Weber lateralizes to the right No cerumen  Eyes: Conjunctivae and EOM are normal. Pupils are equal, round, and reactive to light.  Neck: Normal range of motion. Neck supple. No thyromegaly present.  Cardiovascular: Normal rate and regular rhythm.   Murmur heard. Grade 3/6 systolic murmur with radiation into the carotid distribution Pedal pulses not easily palpable  Pulmonary/Chest: Effort normal and breath sounds normal.  Marked kyphoscoliosis  Abdominal: Soft. Bowel sounds are normal. She exhibits no mass. There is no tenderness.  Musculoskeletal: Normal range of motion.  Lymphadenopathy:    She has no cervical adenopathy.  Neurological: She is alert and oriented to person, place, and time.  Skin: Skin is warm and dry. No rash noted.  Small venous stasis ulcer, right lateral fifth toe  Psychiatric: She has a normal mood and affect. Her behavior is normal.          Assessment & Plan:  Deconditioning.  Continue aggressive rehabilitation.  Hopefully will be stable for discharge to independent living.  On December 30 Hypertension controlled Osteoarthritis. Reasonably stable;  Patient has permanent handicap placard  Gait instability with frequent falls.  Alternate living arrangements.  [facility]  Hospital records reviewed No change in medication,

## 2015-09-21 ENCOUNTER — Telehealth: Payer: Self-pay | Admitting: Internal Medicine

## 2015-09-21 ENCOUNTER — Encounter: Payer: Self-pay | Admitting: *Deleted

## 2015-09-21 NOTE — Telephone Encounter (Signed)
Inocencio HomesGayle, the pt's daughter called saying the pt will be released from [facility] on Friday, December 30th. Since Ms. Freida Busmanllen just had a CPE on 12.20.16, Inocencio HomesGayle thinks a follow-up appt on 1.3.17 is unnecessary. She said it's very difficult to get Ms. Raven up and about and to her appts. She doesn't want there to be a problem as far as Ms. Elliff receiving her medication but she'd like a phone call regarding this to see if she really needs to come back on January 3rd.  Gayle's ph# [phone] Thank you.

## 2015-09-21 NOTE — Telephone Encounter (Signed)
Erin Gibson, please call and reschedule appt for 4 months out per last note of Dr.K's. Pt does not need to come in for jan 3rd appt can cancel. If needs any prescriptions just let me know.

## 2015-09-21 NOTE — Telephone Encounter (Signed)
Pt is scheduled on 4.28.17. Thank you.

## 2015-09-27 ENCOUNTER — Ambulatory Visit: Payer: Federal, State, Local not specified - PPO | Admitting: Internal Medicine

## 2015-09-27 ENCOUNTER — Telehealth: Payer: Self-pay | Admitting: Internal Medicine

## 2015-09-27 NOTE — Telephone Encounter (Signed)
Zeenta from [facility] called to let MD aware that patient was not seen 09/27/2015 and will reschedule for tomorrrow.

## 2015-09-27 NOTE — Telephone Encounter (Signed)
FYI, see message. 

## 2015-09-28 DIAGNOSIS — I25119 Atherosclerotic heart disease of native coronary artery with unspecified angina pectoris: Secondary | ICD-10-CM | POA: Diagnosis not present

## 2015-09-28 DIAGNOSIS — Z96653 Presence of artificial knee joint, bilateral: Secondary | ICD-10-CM | POA: Diagnosis not present

## 2015-09-28 DIAGNOSIS — R911 Solitary pulmonary nodule: Secondary | ICD-10-CM | POA: Diagnosis not present

## 2015-09-28 DIAGNOSIS — I129 Hypertensive chronic kidney disease with stage 1 through stage 4 chronic kidney disease, or unspecified chronic kidney disease: Secondary | ICD-10-CM | POA: Diagnosis not present

## 2015-09-28 DIAGNOSIS — M6281 Muscle weakness (generalized): Secondary | ICD-10-CM | POA: Diagnosis not present

## 2015-09-28 DIAGNOSIS — Z87440 Personal history of urinary (tract) infections: Secondary | ICD-10-CM | POA: Diagnosis not present

## 2015-09-28 DIAGNOSIS — R2689 Other abnormalities of gait and mobility: Secondary | ICD-10-CM | POA: Diagnosis not present

## 2015-09-28 DIAGNOSIS — N183 Chronic kidney disease, stage 3 (moderate): Secondary | ICD-10-CM | POA: Diagnosis not present

## 2015-09-30 DIAGNOSIS — I25119 Atherosclerotic heart disease of native coronary artery with unspecified angina pectoris: Secondary | ICD-10-CM | POA: Diagnosis not present

## 2015-09-30 DIAGNOSIS — M6281 Muscle weakness (generalized): Secondary | ICD-10-CM | POA: Diagnosis not present

## 2015-09-30 DIAGNOSIS — N183 Chronic kidney disease, stage 3 (moderate): Secondary | ICD-10-CM | POA: Diagnosis not present

## 2015-09-30 DIAGNOSIS — R911 Solitary pulmonary nodule: Secondary | ICD-10-CM | POA: Diagnosis not present

## 2015-09-30 DIAGNOSIS — R2689 Other abnormalities of gait and mobility: Secondary | ICD-10-CM | POA: Diagnosis not present

## 2015-09-30 DIAGNOSIS — I129 Hypertensive chronic kidney disease with stage 1 through stage 4 chronic kidney disease, or unspecified chronic kidney disease: Secondary | ICD-10-CM | POA: Diagnosis not present

## 2015-10-04 DIAGNOSIS — R2689 Other abnormalities of gait and mobility: Secondary | ICD-10-CM | POA: Diagnosis not present

## 2015-10-04 DIAGNOSIS — N183 Chronic kidney disease, stage 3 (moderate): Secondary | ICD-10-CM | POA: Diagnosis not present

## 2015-10-04 DIAGNOSIS — M6281 Muscle weakness (generalized): Secondary | ICD-10-CM | POA: Diagnosis not present

## 2015-10-04 DIAGNOSIS — R911 Solitary pulmonary nodule: Secondary | ICD-10-CM | POA: Diagnosis not present

## 2015-10-04 DIAGNOSIS — I129 Hypertensive chronic kidney disease with stage 1 through stage 4 chronic kidney disease, or unspecified chronic kidney disease: Secondary | ICD-10-CM | POA: Diagnosis not present

## 2015-10-04 DIAGNOSIS — I25119 Atherosclerotic heart disease of native coronary artery with unspecified angina pectoris: Secondary | ICD-10-CM | POA: Diagnosis not present

## 2015-10-05 ENCOUNTER — Telehealth: Payer: Self-pay | Admitting: Internal Medicine

## 2015-10-05 NOTE — Telephone Encounter (Signed)
[facility] and would like  all new rxs furosemide,hydralazine,omeprazole,potassium chloride,accupril,sucralfate,diflucan and preservision areds #2 not #1. Pt needs 90 day supply w/refills send to [facility]

## 2015-10-06 DIAGNOSIS — R2689 Other abnormalities of gait and mobility: Secondary | ICD-10-CM | POA: Diagnosis not present

## 2015-10-06 DIAGNOSIS — M6281 Muscle weakness (generalized): Secondary | ICD-10-CM | POA: Diagnosis not present

## 2015-10-06 DIAGNOSIS — I129 Hypertensive chronic kidney disease with stage 1 through stage 4 chronic kidney disease, or unspecified chronic kidney disease: Secondary | ICD-10-CM | POA: Diagnosis not present

## 2015-10-06 DIAGNOSIS — I25119 Atherosclerotic heart disease of native coronary artery with unspecified angina pectoris: Secondary | ICD-10-CM | POA: Diagnosis not present

## 2015-10-06 DIAGNOSIS — N183 Chronic kidney disease, stage 3 (moderate): Secondary | ICD-10-CM | POA: Diagnosis not present

## 2015-10-06 DIAGNOSIS — R911 Solitary pulmonary nodule: Secondary | ICD-10-CM | POA: Diagnosis not present

## 2015-10-06 MED ORDER — SUCRALFATE 1 GM/10ML PO SUSP: 1.0000 g | Freq: Three times a day (TID) | ORAL | Status: DC

## 2015-10-06 MED ORDER — POTASSIUM CHLORIDE CRYS ER 20 MEQ PO TBCR: 20.0000 meq | EXTENDED_RELEASE_TABLET | Freq: Every day | ORAL | Status: DC

## 2015-10-06 MED ORDER — HYDRALAZINE HCL 25 MG PO TABS: 25.0000 mg | ORAL_TABLET | Freq: Three times a day (TID) | ORAL | Status: DC

## 2015-10-06 MED ORDER — FUROSEMIDE 40 MG PO TABS: ORAL_TABLET | ORAL | Status: DC

## 2015-10-06 MED ORDER — OMEPRAZOLE 40 MG PO CPDR: 40.0000 mg | DELAYED_RELEASE_CAPSULE | Freq: Two times a day (BID) | ORAL | Status: DC

## 2015-10-06 MED ORDER — POLYETHYLENE GLYCOL 3350 17 GM/SCOOP PO POWD: ORAL | Status: DC

## 2015-10-06 MED ORDER — PRESERVISION AREDS 2 PO CAPS: 1.0000 | ORAL_CAPSULE | Freq: Every day | ORAL | Status: AC

## 2015-10-06 MED ORDER — QUINAPRIL HCL 40 MG PO TABS: 40.0000 mg | ORAL_TABLET | Freq: Every day | ORAL | Status: DC

## 2015-10-06 NOTE — Telephone Encounter (Signed)
Spoke to pt, told her Rx's sent to [facility]. Pt verbalized understanding.

## 2015-10-11 DIAGNOSIS — I129 Hypertensive chronic kidney disease with stage 1 through stage 4 chronic kidney disease, or unspecified chronic kidney disease: Secondary | ICD-10-CM | POA: Diagnosis not present

## 2015-10-11 DIAGNOSIS — R911 Solitary pulmonary nodule: Secondary | ICD-10-CM | POA: Diagnosis not present

## 2015-10-11 DIAGNOSIS — I25119 Atherosclerotic heart disease of native coronary artery with unspecified angina pectoris: Secondary | ICD-10-CM | POA: Diagnosis not present

## 2015-10-11 DIAGNOSIS — N183 Chronic kidney disease, stage 3 (moderate): Secondary | ICD-10-CM | POA: Diagnosis not present

## 2015-10-11 DIAGNOSIS — R2689 Other abnormalities of gait and mobility: Secondary | ICD-10-CM | POA: Diagnosis not present

## 2015-10-11 DIAGNOSIS — M6281 Muscle weakness (generalized): Secondary | ICD-10-CM | POA: Diagnosis not present

## 2015-10-13 DIAGNOSIS — R911 Solitary pulmonary nodule: Secondary | ICD-10-CM | POA: Diagnosis not present

## 2015-10-13 DIAGNOSIS — R2689 Other abnormalities of gait and mobility: Secondary | ICD-10-CM | POA: Diagnosis not present

## 2015-10-13 DIAGNOSIS — I25119 Atherosclerotic heart disease of native coronary artery with unspecified angina pectoris: Secondary | ICD-10-CM | POA: Diagnosis not present

## 2015-10-13 DIAGNOSIS — M6281 Muscle weakness (generalized): Secondary | ICD-10-CM | POA: Diagnosis not present

## 2015-10-13 DIAGNOSIS — I129 Hypertensive chronic kidney disease with stage 1 through stage 4 chronic kidney disease, or unspecified chronic kidney disease: Secondary | ICD-10-CM | POA: Diagnosis not present

## 2015-10-13 DIAGNOSIS — N183 Chronic kidney disease, stage 3 (moderate): Secondary | ICD-10-CM | POA: Diagnosis not present

## 2015-10-17 DIAGNOSIS — M6281 Muscle weakness (generalized): Secondary | ICD-10-CM | POA: Diagnosis not present

## 2015-10-17 DIAGNOSIS — N183 Chronic kidney disease, stage 3 (moderate): Secondary | ICD-10-CM | POA: Diagnosis not present

## 2015-10-17 DIAGNOSIS — R911 Solitary pulmonary nodule: Secondary | ICD-10-CM | POA: Diagnosis not present

## 2015-10-17 DIAGNOSIS — I129 Hypertensive chronic kidney disease with stage 1 through stage 4 chronic kidney disease, or unspecified chronic kidney disease: Secondary | ICD-10-CM | POA: Diagnosis not present

## 2015-10-17 DIAGNOSIS — R2689 Other abnormalities of gait and mobility: Secondary | ICD-10-CM | POA: Diagnosis not present

## 2015-10-17 DIAGNOSIS — I25119 Atherosclerotic heart disease of native coronary artery with unspecified angina pectoris: Secondary | ICD-10-CM | POA: Diagnosis not present

## 2015-10-19 DIAGNOSIS — I129 Hypertensive chronic kidney disease with stage 1 through stage 4 chronic kidney disease, or unspecified chronic kidney disease: Secondary | ICD-10-CM | POA: Diagnosis not present

## 2015-10-19 DIAGNOSIS — R911 Solitary pulmonary nodule: Secondary | ICD-10-CM | POA: Diagnosis not present

## 2015-10-19 DIAGNOSIS — M6281 Muscle weakness (generalized): Secondary | ICD-10-CM | POA: Diagnosis not present

## 2015-10-19 DIAGNOSIS — R2689 Other abnormalities of gait and mobility: Secondary | ICD-10-CM | POA: Diagnosis not present

## 2015-10-19 DIAGNOSIS — N183 Chronic kidney disease, stage 3 (moderate): Secondary | ICD-10-CM | POA: Diagnosis not present

## 2015-10-19 DIAGNOSIS — I25119 Atherosclerotic heart disease of native coronary artery with unspecified angina pectoris: Secondary | ICD-10-CM | POA: Diagnosis not present

## 2015-10-27 DIAGNOSIS — I25119 Atherosclerotic heart disease of native coronary artery with unspecified angina pectoris: Secondary | ICD-10-CM | POA: Diagnosis not present

## 2015-10-27 DIAGNOSIS — R2689 Other abnormalities of gait and mobility: Secondary | ICD-10-CM | POA: Diagnosis not present

## 2015-10-27 DIAGNOSIS — N183 Chronic kidney disease, stage 3 (moderate): Secondary | ICD-10-CM | POA: Diagnosis not present

## 2015-10-27 DIAGNOSIS — R911 Solitary pulmonary nodule: Secondary | ICD-10-CM | POA: Diagnosis not present

## 2015-10-27 DIAGNOSIS — M6281 Muscle weakness (generalized): Secondary | ICD-10-CM | POA: Diagnosis not present

## 2015-10-27 DIAGNOSIS — I129 Hypertensive chronic kidney disease with stage 1 through stage 4 chronic kidney disease, or unspecified chronic kidney disease: Secondary | ICD-10-CM | POA: Diagnosis not present

## 2015-11-10 DIAGNOSIS — H353211 Exudative age-related macular degeneration, right eye, with active choroidal neovascularization: Secondary | ICD-10-CM | POA: Diagnosis not present

## 2015-11-10 DIAGNOSIS — H353123 Nonexudative age-related macular degeneration, left eye, advanced atrophic without subfoveal involvement: Secondary | ICD-10-CM | POA: Diagnosis not present

## 2015-11-28 ENCOUNTER — Ambulatory Visit (INDEPENDENT_AMBULATORY_CARE_PROVIDER_SITE_OTHER): Payer: Medicare Other | Admitting: Internal Medicine

## 2015-11-28 ENCOUNTER — Encounter: Payer: Self-pay | Admitting: Internal Medicine

## 2015-11-28 VITALS — BP 160/80 | HR 57 | Temp 98.0°F | Resp 20 | Ht 58.75 in | Wt 110.0 lb

## 2015-11-28 DIAGNOSIS — I10 Essential (primary) hypertension: Secondary | ICD-10-CM

## 2015-11-28 DIAGNOSIS — N183 Chronic kidney disease, stage 3 unspecified: Secondary | ICD-10-CM

## 2015-11-28 DIAGNOSIS — D509 Iron deficiency anemia, unspecified: Secondary | ICD-10-CM

## 2015-11-28 LAB — CBC WITH DIFFERENTIAL/PLATELET
BASOS ABS: 0 10*3/uL (ref 0.0–0.1)
BASOS PCT: 0.6 % (ref 0.0–3.0)
EOS ABS: 0.2 10*3/uL (ref 0.0–0.7)
Eosinophils Relative: 4.3 % (ref 0.0–5.0)
HCT: 38.1 % (ref 36.0–46.0)
Hemoglobin: 12.7 g/dL (ref 12.0–15.0)
Lymphocytes Relative: 25.5 % (ref 12.0–46.0)
Lymphs Abs: 1.3 10*3/uL (ref 0.7–4.0)
MCHC: 33.3 g/dL (ref 30.0–36.0)
MCV: 91.2 fl (ref 78.0–100.0)
MONO ABS: 0.5 10*3/uL (ref 0.1–1.0)
Monocytes Relative: 9.9 % (ref 3.0–12.0)
NEUTROS ABS: 3.1 10*3/uL (ref 1.4–7.7)
NEUTROS PCT: 59.7 % (ref 43.0–77.0)
PLATELETS: 197 10*3/uL (ref 150.0–400.0)
RBC: 4.18 Mil/uL (ref 3.87–5.11)
RDW: 14.9 % (ref 11.5–15.5)
WBC: 5.2 10*3/uL (ref 4.0–10.5)

## 2015-11-28 LAB — COMPREHENSIVE METABOLIC PANEL
ALBUMIN: 3.9 g/dL (ref 3.5–5.2)
ALT: 12 U/L (ref 0–35)
AST: 17 U/L (ref 0–37)
Alkaline Phosphatase: 117 U/L (ref 39–117)
BILIRUBIN TOTAL: 0.8 mg/dL (ref 0.2–1.2)
BUN: 32 mg/dL — AB (ref 6–23)
CALCIUM: 9.5 mg/dL (ref 8.4–10.5)
CHLORIDE: 110 meq/L (ref 96–112)
CO2: 25 meq/L (ref 19–32)
CREATININE: 1.27 mg/dL — AB (ref 0.40–1.20)
GFR: 41.54 mL/min — ABNORMAL LOW (ref >60.00)
Glucose, Bld: 57 mg/dL — ABNORMAL LOW (ref 70–99)
Potassium: 5.5 mEq/L — ABNORMAL HIGH (ref 3.5–5.1)
SODIUM: 142 meq/L (ref 135–145)
Total Protein: 6.5 g/dL (ref 6.0–8.3)

## 2015-11-28 NOTE — Progress Notes (Signed)
Pre visit review using our clinic review tool, if applicable. No additional management support is needed unless otherwise documented below in the visit note. 

## 2015-11-28 NOTE — Progress Notes (Signed)
Subjective:    Patient ID: Erin CirriViolet W Sparano, female    DOB: 06/06/1919, 82 y.o.   MRN: [id number]  HPI  82 year old patient who presents with a chief complaint of cough of one week's duration.  Cough has improved dramatically over the weekend.  She describes some mild sinus pressure. She has essential hypertension.  She has a history mild anemia and chronic kidney disease.  You and was increased in December at last lab draw.  She complains of some occasional lower extremity edema. She lives alone at 89, but does have a sitter throughout the day.  [place] in [place]  Past Medical History  Diagnosis Date  . ANEMIA 03/31/2009  . DEGENERATIVE JOINT DISEASE, GENERALIZED 05/06/2007  . GERD 04/24/2007  . HYPERTENSION 04/24/2007  . VENOUS STASIS ULCER 07/20/2008  . DVT (deep venous thrombosis) (HCC)     hx of  . Chronic kidney disease   . Diverticulosis of sigmoid colon     Colonoscopy 2011    Social History   Social History  . Marital Status: Widowed    Spouse Name: N/A  . Number of Children: N/A  . Years of Education: N/A   Occupational History  . Not on file.   Social History Main Topics  . Smoking status: Never Smoker   . Smokeless tobacco: Never Used  . Alcohol Use: No  . Drug Use: No  . Sexual Activity: Not on file   Other Topics Concern  . Not on file   Social History Narrative    Past Surgical History  Procedure Laterality Date  . Cataract extraction    . Abdominal hysterectomy      Completion hysterectomy  . Total knee arthroplasty      x2  . Hernia repair    . Cholecystectomy open    . Partial hysterectomy      Salpingo-oophorectomy as well    No family history on file.  Allergies  Allergen Reactions  . Codeine Nausea And Vomiting    Violently ill  . Diltiazem Hcl Other (See Comments)    Not sure about this reaction  . Ibuprofen Nausea And Vomiting  . Tramadol     Auditory and visual hallucinations    Current Outpatient Prescriptions on  File Prior to Visit  Medication Sig Dispense Refill  . Artificial Tear GEL Place 2 drops into both eyes 2 (two) times daily.    Marland Kitchen. aspirin EC 81 MG tablet Take 81 mg by mouth every morning.    . Calcium Carbonate-Vitamin D (CALCIUM 600 + D PO) Take 1 tablet by mouth every morning.    . Cyanocobalamin (VITAMIN B-12) 2500 MCG SUBL Place 1 tablet under the tongue every morning.    . furosemide (LASIX) 40 MG tablet TAKE 1/2 TABLET (=20MG )    DAILY 45 tablet 1  . hydrALAZINE (APRESOLINE) 25 MG tablet Take 1 tablet (25 mg total) by mouth every 8 (eight) hours. 90 tablet 1  . meclizine (ANTIVERT) 25 MG tablet TAKE 1 TABLET 3 TIMES A DAYAS NEEDED FOR DIZZINESS 270 tablet 0  . Multiple Vitamins-Minerals (PRESERVISION AREDS 2) CAPS Take 1 capsule by mouth daily. 90 capsule 3  . nitroGLYCERIN (NITROSTAT) 0.4 MG SL tablet Place 0.4 mg under the tongue every 5 (five) minutes as needed for chest pain.    Marland Kitchen. omeprazole (PRILOSEC) 40 MG capsule Take 1 capsule (40 mg total) by mouth 2 (two) times daily. 180 capsule 1  . Pollen Extracts (PROSTAT PO) Take 30  mLs by mouth daily.    . polyethylene glycol powder (GLYCOLAX/MIRALAX) powder MIX 17GM IN LIQUID AND     DRINK TWO TIMES A DAY 3162 g 3  . potassium chloride SA (K-DUR,KLOR-CON) 20 MEQ tablet Take 1 tablet (20 mEq total) by mouth daily. 90 tablet 1  . quinapril (ACCUPRIL) 40 MG tablet Take 1 tablet (40 mg total) by mouth daily. 90 tablet 1  . vitamin E (VITAMIN E) 400 UNIT capsule Take 400 Units by mouth every morning.     . sucralfate (CARAFATE) 1 GM/10ML suspension Take 10 mLs (1 g total) by mouth 4 (four) times daily -  with meals and at bedtime. (Patient not taking: Reported on 11/28/2015) 420 mL 1   No current facility-administered medications on file prior to visit.    BP 160/80 mmHg  Pulse 57  Temp(Src) 98 F (36.7 C) (Oral)  Resp 20  Ht 4' 10.75" (1.492 m)  Wt 110 lb (49.896 kg)  BMI 22.41 kg/m2  SpO2 98%     Review of Systems    Constitutional: Negative.   HENT: Positive for sinus pressure. Negative for congestion, dental problem, hearing loss, rhinorrhea, sore throat and tinnitus.   Eyes: Negative for pain, discharge and visual disturbance.  Respiratory: Positive for cough. Negative for shortness of breath.   Cardiovascular: Positive for leg swelling. Negative for chest pain and palpitations.  Gastrointestinal: Negative for nausea, vomiting, abdominal pain, diarrhea, constipation, blood in stool and abdominal distention.  Genitourinary: Negative for dysuria, urgency, frequency, hematuria, flank pain, vaginal bleeding, vaginal discharge, difficulty urinating, vaginal pain and pelvic pain.  Musculoskeletal: Negative for joint swelling, arthralgias and gait problem.  Skin: Negative for rash.  Neurological: Negative for dizziness, syncope, speech difficulty, weakness, numbness and headaches.  Hematological: Negative for adenopathy.  Psychiatric/Behavioral: Negative for behavioral problems, dysphoric mood and agitation. The patient is not nervous/anxious.        Objective:   Physical Exam  Constitutional: She is oriented to person, place, and time. She appears well-developed and well-nourished.  HENT:  Head: Normocephalic.  Right Ear: External ear normal.  Left Ear: External ear normal.  Mouth/Throat: Oropharynx is clear and moist.  Well-hydrated  Left tympanoplasty tube  Eyes: Conjunctivae and EOM are normal. Pupils are equal, round, and reactive to light.  Neck: Normal range of motion. Neck supple. No thyromegaly present.  Cardiovascular: Normal rate, regular rhythm, normal heart sounds and intact distal pulses.   Pulmonary/Chest: Effort normal and breath sounds normal. No respiratory distress. She has no wheezes. She has no rales.  Kyphoscoliosis  Abdominal: Soft. Bowel sounds are normal. She exhibits no mass. There is no tenderness.  Musculoskeletal: Normal range of motion. She exhibits edema.  Trace left  ankle edema  Lymphadenopathy:    She has no cervical adenopathy.  Neurological: She is alert and oriented to person, place, and time.  Skin: Skin is warm and dry. No rash noted.  Psychiatric: She has a normal mood and affect. Her behavior is normal.          Assessment & Plan:   Resolving URI with cough.  Improved Essential hypertension.  Elevated diastolic reading will continue present regimen and observe History of chronic kidney disease.  Will recheck indices History of anemia.  Will recheck CBC

## 2015-11-28 NOTE — Patient Instructions (Signed)
Limit your sodium (Salt) intake  Please check your blood pressure on a regular basis.  If it is consistently greater than 150/90, please make an office appointment.  Return in 3 months for follow-up  

## 2015-12-07 ENCOUNTER — Encounter: Payer: Self-pay | Admitting: Internal Medicine

## 2015-12-08 ENCOUNTER — Telehealth: Payer: Self-pay | Admitting: *Deleted

## 2015-12-08 NOTE — Telephone Encounter (Signed)
MyChart Message sent  

## 2015-12-12 NOTE — Telephone Encounter (Signed)
Dr.K, please see message from pt's daughter and advise.

## 2015-12-24 ENCOUNTER — Encounter (HOSPITAL_COMMUNITY): Payer: Self-pay | Admitting: *Deleted

## 2015-12-24 ENCOUNTER — Observation Stay (HOSPITAL_COMMUNITY)
Admission: EM | Admit: 2015-12-24 | Discharge: 2015-12-27 | Disposition: A | Discharge status: Home Health | Payer: Medicare Other | Attending: Family Medicine | Admitting: Family Medicine

## 2015-12-24 DIAGNOSIS — N390 Urinary tract infection, site not specified: Secondary | ICD-10-CM | POA: Diagnosis present

## 2015-12-24 DIAGNOSIS — D649 Anemia, unspecified: Secondary | ICD-10-CM | POA: Diagnosis present

## 2015-12-24 DIAGNOSIS — I350 Nonrheumatic aortic (valve) stenosis: Secondary | ICD-10-CM | POA: Insufficient documentation

## 2015-12-24 DIAGNOSIS — S098XXA Other specified injuries of head, initial encounter: Secondary | ICD-10-CM | POA: Diagnosis not present

## 2015-12-24 DIAGNOSIS — Z885 Allergy status to narcotic agent status: Secondary | ICD-10-CM | POA: Diagnosis not present

## 2015-12-24 DIAGNOSIS — Z23 Encounter for immunization: Secondary | ICD-10-CM | POA: Diagnosis not present

## 2015-12-24 DIAGNOSIS — E875 Hyperkalemia: Secondary | ICD-10-CM | POA: Diagnosis present

## 2015-12-24 DIAGNOSIS — R778 Other specified abnormalities of plasma proteins: Secondary | ICD-10-CM | POA: Insufficient documentation

## 2015-12-24 DIAGNOSIS — Z886 Allergy status to analgesic agent status: Secondary | ICD-10-CM | POA: Insufficient documentation

## 2015-12-24 DIAGNOSIS — Z79899 Other long term (current) drug therapy: Secondary | ICD-10-CM | POA: Diagnosis not present

## 2015-12-24 DIAGNOSIS — K219 Gastro-esophageal reflux disease without esophagitis: Secondary | ICD-10-CM | POA: Insufficient documentation

## 2015-12-24 DIAGNOSIS — R0602 Shortness of breath: Secondary | ICD-10-CM | POA: Diagnosis not present

## 2015-12-24 DIAGNOSIS — R7989 Other specified abnormal findings of blood chemistry: Secondary | ICD-10-CM | POA: Diagnosis present

## 2015-12-24 DIAGNOSIS — Z7982 Long term (current) use of aspirin: Secondary | ICD-10-CM | POA: Diagnosis not present

## 2015-12-24 DIAGNOSIS — Y92000 Kitchen of unspecified non-institutional (private) residence as  the place of occurrence of the external cause: Secondary | ICD-10-CM | POA: Diagnosis not present

## 2015-12-24 DIAGNOSIS — Z888 Allergy status to other drugs, medicaments and biological substances status: Secondary | ICD-10-CM | POA: Insufficient documentation

## 2015-12-24 DIAGNOSIS — S199XXA Unspecified injury of neck, initial encounter: Secondary | ICD-10-CM | POA: Diagnosis not present

## 2015-12-24 DIAGNOSIS — S0101XA Laceration without foreign body of scalp, initial encounter: Secondary | ICD-10-CM | POA: Diagnosis not present

## 2015-12-24 DIAGNOSIS — S0190XA Unspecified open wound of unspecified part of head, initial encounter: Secondary | ICD-10-CM | POA: Diagnosis not present

## 2015-12-24 DIAGNOSIS — I129 Hypertensive chronic kidney disease with stage 1 through stage 4 chronic kidney disease, or unspecified chronic kidney disease: Secondary | ICD-10-CM | POA: Diagnosis not present

## 2015-12-24 DIAGNOSIS — W19XXXA Unspecified fall, initial encounter: Secondary | ICD-10-CM | POA: Diagnosis not present

## 2015-12-24 DIAGNOSIS — R55 Syncope and collapse: Principal | ICD-10-CM | POA: Insufficient documentation

## 2015-12-24 DIAGNOSIS — N183 Chronic kidney disease, stage 3 unspecified: Secondary | ICD-10-CM

## 2015-12-24 DIAGNOSIS — S0990XA Unspecified injury of head, initial encounter: Secondary | ICD-10-CM

## 2015-12-24 DIAGNOSIS — I10 Essential (primary) hypertension: Secondary | ICD-10-CM | POA: Diagnosis present

## 2015-12-24 LAB — CBC WITH DIFFERENTIAL/PLATELET
BASOS ABS: 0 10*3/uL (ref 0.0–0.1)
Basophils Relative: 0 %
Eosinophils Absolute: 0.1 10*3/uL (ref 0.0–0.7)
Eosinophils Relative: 1 %
HEMATOCRIT: 34.1 % — AB (ref 36.0–46.0)
Hemoglobin: 11.1 g/dL — ABNORMAL LOW (ref 12.0–15.0)
LYMPHS ABS: 0.9 10*3/uL (ref 0.7–4.0)
LYMPHS PCT: 13 %
MCH: 30.1 pg (ref 26.0–34.0)
MCHC: 32.6 g/dL (ref 30.0–36.0)
MCV: 92.4 fL (ref 78.0–100.0)
MONO ABS: 0.5 10*3/uL (ref 0.1–1.0)
Monocytes Relative: 8 %
NEUTROS ABS: 5 10*3/uL (ref 1.7–7.7)
Neutrophils Relative %: 78 %
Platelets: 164 10*3/uL (ref 150–400)
RBC: 3.69 MIL/uL — AB (ref 3.87–5.11)
RDW: 13.9 % (ref 11.5–15.5)
WBC: 6.5 10*3/uL (ref 4.0–10.5)

## 2015-12-24 LAB — BASIC METABOLIC PANEL
ANION GAP: 6 (ref 5–15)
BUN: 34 mg/dL — AB (ref 6–20)
CO2: 22 mmol/L (ref 22–32)
Calcium: 9.4 mg/dL (ref 8.9–10.3)
Chloride: 115 mmol/L — ABNORMAL HIGH (ref 101–111)
Creatinine, Ser: 1.45 mg/dL — ABNORMAL HIGH (ref 0.44–1.00)
GFR calc Af Amer: 34 mL/min — ABNORMAL LOW (ref >60)
GFR calc non Af Amer: 30 mL/min — ABNORMAL LOW (ref >60)
GLUCOSE: 116 mg/dL — AB (ref 65–99)
POTASSIUM: 5.8 mmol/L — AB (ref 3.5–5.1)
Sodium: 143 mmol/L (ref 135–145)

## 2015-12-24 LAB — I-STAT TROPONIN, ED: Troponin i, poc: 0.14 ng/mL (ref 0.00–0.08)

## 2015-12-24 MED ORDER — LIDOCAINE-EPINEPHRINE (PF) 2 %-1:200000 IJ SOLN
20.0000 mL | Freq: Once | INTRAMUSCULAR | Status: AC
  Administered 2015-12-24: 20 mL
  Filled 2015-12-24: qty 20

## 2015-12-24 NOTE — ED Notes (Signed)
Patient was at home in the kitchen and fell hitting the back of her head.  Usually uses a walker but the walker was not in use at the time.  Unsure why she fell and it took her about 1 hour to get to the phone.  Lives by herself.  Bleeding from the back of the head is uncontrolled using 2 ABD's and a towel  CBG 161 Per em, bp 164/72, 94% ra Denies neck or back pain c/o headache 7/10 Pupils equal and reactive, denies N/V

## 2015-12-24 NOTE — ED Provider Notes (Signed)
By signing my name below, I, Freida Busman, attest that this documentation has been prepared under the direction and in the presence of Mister Krahenbuhl N Giovanni Biby, DO . Electronically Signed: Freida Busman, Scribe. 12/25/2015. 12:07 AM.   TIME SEEN: 11:38 PM  CHIEF COMPLAINT:  Chief Complaint  Patient presents with  . Fall    HPI:   Erin Gibson is a 82 y.o. female with a history of HTN, anemia, and CKD, who presents to the Emergency Department via EMS s/p fall this evening. Pt is complaining of a large laceration to the back of her head following the incident. She reports associated 7/10 pain to the site. She was walking in the kitchen when she fell and injured her head. She denies LOC. Pt states she normally ambulates with a walker but was not using the walker at the time of the fall. Pt notes she was on the floor for ~ 1 hour before she was able to call for help and has not ambulated since the fall. Pt is unsure what caused her fall or what she struck her head on, believes it may have been the kitchen table.  Pt denies CP and SOB prior to fall. She also does not believe she tripped or slipped. Pt has no other physical complaints or injuries at this time. She denies CP, numbness/tingling, nausea, vomiting and diarrhea. Pt also denies use of anti-coagulants other than daily baby ASA. Tetanus status is unknown. No alleviating factors noted. Pt lives alone.  She denies h/o CHF, MI, cardiac cathetherization, or coronary stent placement. Pt notes this is her third fall since leaving rehab ~ 3 weeks ago. Pt also notes SOB x a few weeks. Also recently had a cough that has resolved. States her shortness of breath has been worsening over the past several weeks, worse with exertion.  PCP: Amador Cunas- [place]   ROS: See HPI Constitutional: no fever  Eyes: no drainage  ENT: no runny nose   Cardiovascular:  no chest pain  Resp:  SOB  GI: no vomiting GU: no dysuria Integumentary: no rash  Allergy: no hives   Musculoskeletal: no leg swelling  Neurological: no slurred speech ROS otherwise negative  PAST MEDICAL HISTORY/PAST SURGICAL HISTORY:  Past Medical History  Diagnosis Date  . ANEMIA 03/31/2009  . DEGENERATIVE JOINT DISEASE, GENERALIZED 05/06/2007  . GERD 04/24/2007  . HYPERTENSION 04/24/2007  . VENOUS STASIS ULCER 07/20/2008  . DVT (deep venous thrombosis) (HCC)     hx of  . Chronic kidney disease   . Diverticulosis of sigmoid colon     Colonoscopy 2011    MEDICATIONS:  Prior to Admission medications   Medication Sig Start Date End Date Taking? Authorizing Provider  Artificial Tear GEL Place 2 drops into both eyes 2 (two) times daily.    Historical Provider, MD  aspirin EC 81 MG tablet Take 81 mg by mouth every morning.    Historical Provider, MD  Calcium Carbonate-Vitamin D (CALCIUM 600 + D PO) Take 1 tablet by mouth every morning.    Historical Provider, MD  Cyanocobalamin (VITAMIN B-12) 2500 MCG SUBL Place 1 tablet under the tongue every morning.    Historical Provider, MD  furosemide (LASIX) 40 MG tablet TAKE 1/2 TABLET (=20MG )    DAILY 10/06/15   Gordy Savers, MD  hydrALAZINE (APRESOLINE) 25 MG tablet Take 1 tablet (25 mg total) by mouth every 8 (eight) hours. 10/06/15   Gordy Savers, MD  Multiple Vitamins-Minerals (PRESERVISION AREDS 2) CAPS Take 1  capsule by mouth daily. 10/06/15   Gordy Savers, MD  nitroGLYCERIN (NITROSTAT) 0.4 MG SL tablet Place 0.4 mg under the tongue every 5 (five) minutes as needed for chest pain.    Historical Provider, MD  omeprazole (PRILOSEC) 40 MG capsule Take 1 capsule (40 mg total) by mouth 2 (two) times daily. 10/06/15   Gordy Savers, MD  polyethylene glycol powder (GLYCOLAX/MIRALAX) powder MIX 17GM IN LIQUID AND     DRINK TWO TIMES A DAY 10/06/15   Gordy Savers, MD  potassium chloride SA (K-DUR,KLOR-CON) 20 MEQ tablet Take 1 tablet (20 mEq total) by mouth daily. 10/06/15   Gordy Savers, MD  quinapril (ACCUPRIL) 40  MG tablet Take 1 tablet (40 mg total) by mouth daily. 10/06/15   Gordy Savers, MD  vitamin E (VITAMIN E) 400 UNIT capsule Take 400 Units by mouth every morning.     Historical Provider, MD    ALLERGIES:  Allergies  Allergen Reactions  . Codeine Nausea And Vomiting    Violently ill  . Diltiazem Hcl Other (See Comments)    Not sure about this reaction  . Ibuprofen Nausea And Vomiting  . Tramadol     Auditory and visual hallucinations    SOCIAL HISTORY:  Social History  Substance Use Topics  . Smoking status: Never Smoker   . Smokeless tobacco: Never Used  . Alcohol Use: No    FAMILY HISTORY: No family history on file.  EXAM: BP 128/59 mmHg  Pulse 68  Temp(Src) 98.5 F (36.9 C) (Oral)  Resp 16  Ht  (1.473 m)  Wt 110 lb (49.896 kg)  BMI 23.00 kg/m2  SpO2 99% CONSTITUTIONAL: Alert and oriented x 3 and responds appropriately to questions. Elderly, frail. GCS 15 HEAD: Normocephalic; 7 cm laceration to posterior scalp EYES: Conjunctivae clear, PERRL, EOMI ENT: normal nose; no rhinorrhea; moist mucous membranes; pharynx without lesions noted; no dental injury; no septal hematoma NECK: Supple, no meningismus, no LAD; no midline spinal tenderness, step-off or deformity CARD: RRR; S1 and S2 appreciated; no murmurs, no clicks, no rubs, no gallops RESP: Normal chest excursion without splinting or tachypnea; breath sounds clear and equal bilaterally; no wheezes, no rhonchi, no rales; no hypoxia or respiratory distress CHEST:  chest wall stable, no crepitus or ecchymosis or deformity, Slightly tender to palpation over the anterior chest wall, no sinus chest ABD/GI: Normal bowel sounds; non-distended; soft, non-tender, no rebound, no guarding PELVIS:  stable, nontender to palpation BACK:  The back appears normal and is non-tender to palpation, there is no CVA tenderness; no midline spinal tenderness, step-off or deformity; patient is kyphotic EXT: Normal ROM in all joints;  non-tender to palpation; no edema; normal capillary refill; no cyanosis, no bony tenderness or bony deformity of patient's extremities, no joint effusion, no ecchymosis or lacerations    SKIN: Normal color for age and race; warm NEURO: Moves all extremities equally, sensation to light touch intact diffusely, cranial nerves II through XII intact PSYCH: The patient's mood and manner are appropriate. Grooming and personal hygiene are appropriate.   EKG Interpretation  Date/Time:  Saturday December 24 2015 22:57:52 EDT Ventricular Rate:  62 PR Interval:  210 QRS Duration: 94 QT Interval:  405 QTC Calculation: 411 R Axis:   -36 Text Interpretation:  Sinus rhythm Left axis deviation No significant change since last tracing Confirmed by Lechelle Wrigley,  DO, Ramie Erman ([id number]) on 12/24/2015 11:30:55 PM       LACERATION REPAIR PROCEDURE NOTE The  patient's identification was confirmed and consent was obtained. This procedure was performed by Layla MawKristen N Krystelle Prashad, DO at 11:52 PM. Site: posterior scalp  Sterile procedures observed Anesthetic used (type and amt): lidocaine 2% with epi; 8 mL Suture type/size: staples Length: 7 cm laceration # of Staples: 9 Tetanus ordered Site anesthetized, irrigated with NS, explored without evidence of foreign body, wound well approximated, site covered with dry, sterile dressing.  Patient tolerated procedure well without complications. Instructions for care discussed verbally and patient provided with additional written instructions for homecare and f/u.  MEDICAL DECISION MAKING: Patient here with a fall due to unknown reasons. She was not using her walker but states that she cannot room air while she fell. Denies any preceding symptoms. Is not sure if she lost consciousness. Has a large laceration to her posterior scalp which has been cleaned and repaired. Will update her tetanus vaccination.  Will obtain a CT of her head and cervical spine, also obtain a chest x-ray. EKG shows no  new ischemic abnormality. Labs ordered prior to me seeing the patient show mildly elevated troponin.  This is concerning given her recent history of shortness of breath with exertion. Discussed the possibility of cardiac catheterization with patient and daughter. They state that they would not be unwilling to perform this test if felt necessary. She seems to be high functioning, lives on her own despite being in her 8390s. Also found to be hyperkalemic with potassium of 5.8. We'll give IV fluids. Creatinine also mildly elevated at 1.45. No EKG changes - no peaked T waves, no interval changes.  ED PROGRESS: Patient's head and cervical spine CT showed no acute abnormality. Urine shows possible mild UTI. Culture pending. We'll give ceftriaxone. Patient is still hemodynamically stable and at her baseline. We'll admit to medicine for observation.   2:36 AM Discussed Case with Dr. Maryfrances Bunnellanford, hospitalist service, who agrees to admission to tele obs. I will place will orders per his request. Care transferred to hospital service. Patient and family updated with this plan.     I personally performed the services described in this documentation, which was scribed in my presence. The recorded information has been reviewed and is accurate.    Layla MawKristen N Jasir Rother, DO 12/25/15 [phone]

## 2015-12-25 ENCOUNTER — Emergency Department (HOSPITAL_COMMUNITY): Payer: Medicare Other

## 2015-12-25 ENCOUNTER — Encounter (HOSPITAL_COMMUNITY): Payer: Self-pay | Admitting: *Deleted

## 2015-12-25 DIAGNOSIS — E875 Hyperkalemia: Secondary | ICD-10-CM | POA: Diagnosis not present

## 2015-12-25 DIAGNOSIS — D509 Iron deficiency anemia, unspecified: Secondary | ICD-10-CM

## 2015-12-25 DIAGNOSIS — N183 Chronic kidney disease, stage 3 (moderate): Secondary | ICD-10-CM

## 2015-12-25 DIAGNOSIS — S199XXA Unspecified injury of neck, initial encounter: Secondary | ICD-10-CM | POA: Diagnosis not present

## 2015-12-25 DIAGNOSIS — R0602 Shortness of breath: Secondary | ICD-10-CM | POA: Diagnosis not present

## 2015-12-25 DIAGNOSIS — S0101XA Laceration without foreign body of scalp, initial encounter: Secondary | ICD-10-CM | POA: Diagnosis present

## 2015-12-25 DIAGNOSIS — Z23 Encounter for immunization: Secondary | ICD-10-CM | POA: Diagnosis not present

## 2015-12-25 DIAGNOSIS — R778 Other specified abnormalities of plasma proteins: Secondary | ICD-10-CM | POA: Diagnosis not present

## 2015-12-25 DIAGNOSIS — W19XXXA Unspecified fall, initial encounter: Secondary | ICD-10-CM | POA: Diagnosis present

## 2015-12-25 DIAGNOSIS — N390 Urinary tract infection, site not specified: Secondary | ICD-10-CM | POA: Diagnosis present

## 2015-12-25 DIAGNOSIS — I350 Nonrheumatic aortic (valve) stenosis: Secondary | ICD-10-CM | POA: Diagnosis not present

## 2015-12-25 DIAGNOSIS — R7989 Other specified abnormal findings of blood chemistry: Secondary | ICD-10-CM | POA: Diagnosis not present

## 2015-12-25 DIAGNOSIS — S0990XA Unspecified injury of head, initial encounter: Secondary | ICD-10-CM | POA: Diagnosis not present

## 2015-12-25 DIAGNOSIS — R55 Syncope and collapse: Secondary | ICD-10-CM | POA: Diagnosis not present

## 2015-12-25 LAB — URINE MICROSCOPIC-ADD ON: RBC / HPF: NONE SEEN RBC/hpf (ref 0–5)

## 2015-12-25 LAB — URINALYSIS, ROUTINE W REFLEX MICROSCOPIC
Bilirubin Urine: NEGATIVE
GLUCOSE, UA: NEGATIVE mg/dL
HGB URINE DIPSTICK: NEGATIVE
Ketones, ur: NEGATIVE mg/dL
Nitrite: NEGATIVE
Protein, ur: NEGATIVE mg/dL
SPECIFIC GRAVITY, URINE: 1.014 (ref 1.005–1.030)
pH: 5.5 (ref 5.0–8.0)

## 2015-12-25 LAB — BASIC METABOLIC PANEL
Anion gap: 6 (ref 5–15)
BUN: 31 mg/dL — AB (ref 6–20)
CALCIUM: 8.7 mg/dL — AB (ref 8.9–10.3)
CO2: 20 mmol/L — ABNORMAL LOW (ref 22–32)
Chloride: 115 mmol/L — ABNORMAL HIGH (ref 101–111)
Creatinine, Ser: 1.29 mg/dL — ABNORMAL HIGH (ref 0.44–1.00)
GFR calc Af Amer: 39 mL/min — ABNORMAL LOW (ref >60)
GFR, EST NON AFRICAN AMERICAN: 34 mL/min — AB (ref >60)
GLUCOSE: 85 mg/dL (ref 65–99)
Potassium: 5.6 mmol/L — ABNORMAL HIGH (ref 3.5–5.1)
SODIUM: 141 mmol/L (ref 135–145)

## 2015-12-25 LAB — TROPONIN I
Troponin I: 0.08 ng/mL — ABNORMAL HIGH (ref <0.031)
Troponin I: 0.29 ng/mL — ABNORMAL HIGH
Troponin I: 0.23 ng/mL — ABNORMAL HIGH
Troponin I: 0.2 ng/mL — ABNORMAL HIGH
Troponin I: 0.15 ng/mL — ABNORMAL HIGH (ref <0.031)

## 2015-12-25 LAB — CK: Total CK: 67 U/L (ref 38–234)

## 2015-12-25 LAB — GLUCOSE, CAPILLARY: Glucose-Capillary: 90 mg/dL (ref 65–99)

## 2015-12-25 MED ORDER — FENTANYL CITRATE (PF) 100 MCG/2ML IJ SOLN
12.5000 ug | INTRAMUSCULAR | Status: DC | PRN
  Administered 2015-12-25 (×2): 12.5 ug via INTRAVENOUS
  Filled 2015-12-25 (×2): qty 2

## 2015-12-25 MED ORDER — ENSURE ENLIVE PO LIQD
237.0000 mL | Freq: Three times a day (TID) | ORAL | Status: DC
  Administered 2015-12-25 – 2015-12-27 (×7): 237 mL via ORAL

## 2015-12-25 MED ORDER — SODIUM CHLORIDE 0.9 % IV BOLUS (SEPSIS): 500.0000 mL | Freq: Once | INTRAVENOUS | Status: DC

## 2015-12-25 MED ORDER — ASPIRIN EC 81 MG PO TBEC
81.0000 mg | DELAYED_RELEASE_TABLET | Freq: Every morning | ORAL | Status: DC
  Administered 2015-12-25 – 2015-12-27 (×3): 81 mg via ORAL
  Filled 2015-12-25 (×3): qty 1

## 2015-12-25 MED ORDER — DEXTROSE 5 % IV SOLN
1.0000 g | Freq: Once | INTRAVENOUS | Status: AC
  Administered 2015-12-25: 1 g via INTRAVENOUS
  Filled 2015-12-25: qty 10

## 2015-12-25 MED ORDER — ONDANSETRON HCL 4 MG/2ML IJ SOLN: 4.0000 mg | Freq: Four times a day (QID) | INTRAMUSCULAR | Status: DC | PRN

## 2015-12-25 MED ORDER — FENTANYL CITRATE (PF) 100 MCG/2ML IJ SOLN
25.0000 ug | Freq: Once | INTRAMUSCULAR | Status: AC
  Administered 2015-12-25: 25 ug via INTRAVENOUS
  Filled 2015-12-25: qty 2

## 2015-12-25 MED ORDER — SODIUM CHLORIDE 0.9 % IV BOLUS (SEPSIS)
1000.0000 mL | Freq: Once | INTRAVENOUS | Status: AC
Start: 2015-12-25 — End: 2015-12-25
  Administered 2015-12-25: 1000 mL via INTRAVENOUS

## 2015-12-25 MED ORDER — PANTOPRAZOLE SODIUM 40 MG PO TBEC
80.0000 mg | DELAYED_RELEASE_TABLET | Freq: Every day | ORAL | Status: DC
  Administered 2015-12-25 – 2015-12-27 (×3): 80 mg via ORAL
  Filled 2015-12-25 (×4): qty 2

## 2015-12-25 MED ORDER — FUROSEMIDE 20 MG PO TABS: 20.0000 mg | ORAL_TABLET | Freq: Every day | ORAL | Status: DC

## 2015-12-25 MED ORDER — HYDRALAZINE HCL 25 MG PO TABS
25.0000 mg | ORAL_TABLET | Freq: Three times a day (TID) | ORAL | Status: DC
  Administered 2015-12-25 – 2015-12-26 (×5): 25 mg via ORAL
  Filled 2015-12-25 (×5): qty 1

## 2015-12-25 MED ORDER — ACETAMINOPHEN 325 MG PO TABS: 650.0000 mg | ORAL_TABLET | ORAL | Status: DC | PRN

## 2015-12-25 MED ORDER — GI COCKTAIL ~~LOC~~: 30.0000 mL | Freq: Four times a day (QID) | ORAL | Status: DC | PRN

## 2015-12-25 MED ORDER — POLYETHYLENE GLYCOL 3350 17 G PO PACK
17.0000 g | PACK | Freq: Every day | ORAL | Status: DC
  Administered 2015-12-25 – 2015-12-27 (×3): 17 g via ORAL
  Filled 2015-12-25 (×3): qty 1

## 2015-12-25 MED ORDER — TETANUS-DIPHTH-ACELL PERTUSSIS 5-2.5-18.5 LF-MCG/0.5 IM SUSP
0.5000 mL | Freq: Once | INTRAMUSCULAR | Status: AC
  Administered 2015-12-25: 0.5 mL via INTRAMUSCULAR
  Filled 2015-12-25: qty 0.5

## 2015-12-25 MED ORDER — FUROSEMIDE 20 MG PO TABS
20.0000 mg | ORAL_TABLET | Freq: Every day | ORAL | Status: DC
  Administered 2015-12-26 – 2015-12-27 (×2): 20 mg via ORAL
  Filled 2015-12-25 (×2): qty 1

## 2015-12-25 MED ORDER — ONDANSETRON HCL 4 MG/2ML IJ SOLN
4.0000 mg | Freq: Once | INTRAMUSCULAR | Status: AC
  Administered 2015-12-25: 4 mg via INTRAVENOUS
  Filled 2015-12-25: qty 2

## 2015-12-25 MED ORDER — ASPIRIN 81 MG PO CHEW
324.0000 mg | CHEWABLE_TABLET | Freq: Once | ORAL | Status: AC
  Administered 2015-12-25: 324 mg via ORAL
  Filled 2015-12-25: qty 4

## 2015-12-25 MED ORDER — ENOXAPARIN SODIUM 30 MG/0.3ML ~~LOC~~ SOLN: 30.0000 mg | Freq: Every day | SUBCUTANEOUS | Status: DC

## 2015-12-25 MED ORDER — OXYCODONE HCL 5 MG PO TABS: 5.0000 mg | ORAL_TABLET | Freq: Four times a day (QID) | ORAL | Status: DC | PRN

## 2015-12-25 NOTE — Discharge Summary (Addendum)
Physician Discharge Summary  Erin Gibson EAV:[id number] DOB: 05-01-1919 [name]: 12/24/2015  PCP: Rogelia Boga, MD  Admit date: 12/24/2015 Discharge date: 12/26/2015  Time spent: 45 minutes  Recommendations for Outpatient Follow-up:   Patient has multifactorial syncope-she is orthostatic has moderate aortic stenosis and also has an element of peripheral vertigo-would recommend therapy evaluate her as an outpatient at her home where she will be getting 24 7 care and potentially assist with these issues  I've cut back her dose of hydralazine to 12.5 3 times a day and have cut back her Accupril to half the home dose  No other medication changes-does need however echocardiogram in 6 months  Cardiology to arrange follow-up in the office  Needs removal of staples in one week as an outpatient-can be done at PCP office  She has chronic urinary incontinence and her urine was not a cath specimen and she was asymptomatic for pyelonephritis so was not treated for this   Discharge Diagnoses:  Active Problems:   Anemia   Essential hypertension   CKD (chronic kidney disease), stage III   Fall   Elevated troponin   Scalp laceration   Hyperkalemia   UTI (lower urinary tract infection)   Discharge Condition: Improved  Diet recommendation: Heart healthy low-salt  Filed Weights   12/24/15 2209 12/25/15 0447 12/26/15 0444  Weight: 49.896 kg (110 lb) 49.578 kg (109 lb 4.8 oz) 49.034 kg (108 lb 1.6 oz)   82 y/o ? H/o dysphagia/odynophagia-EGD 2011=stricture-no dilatation, colo 2011-sig diverticulitis htn CKD stg III Normocytic anemia Chr venous insufficiency DVT and PE diagnosed 1998 Neg stress test 2005 Prior SBO ~2012 R lung nodule  Admit 07/21/15-->07/26/15 syncope--felt to be vasovagal, dysphagia felt to be 2/2 to pill/candida esophagitis Had Conservative management at that time and no intervention for dys was done  Admit with fall, syncope Note hyperkalemia--since  11/28/15-slightly peaked T waves no st-t depression on initial EKG Troponin trend 0.08-->0.14--0.29  Other labs stable  CT head chr Microvasc changes Soft tissue sewlling vertex of head   She was admitted orthostatics were revealing for mild postural drop echocardiogram was performed because mainly of elevated troponins but it was also noted she had a grade 4-6 murmur across the precordium consistent with aortic stenosis. I discussed her case with Dr. Winn Jock of cardiology who felt that the gradient was not high enough and recommended another echo in about 6 months as an outpatient She does tell me that she has postural and positional changes to her level of dizziness and that raises the question as to whether she has orthostasis only or whether there is a peripheral component in terms of vestibular imbalance We asked physical therapy to see the patient and they will give her exercises to do as an outpatient as she will be returning home with 24 7 assistance which she has been arrange for already She is hemodynamically stable on day of discharge and it was felt that she would benefit from physical therapy input prior to discharge and hence she will be discharged on cardiology 12/27/15   Procedures:  Echocardiogram  Consultations: Cardiology  Discharge Exam: Filed Vitals:   12/26/15 1130 12/26/15 1400  BP: 141/81 141/64  Pulse: 82   Temp: 98 F (36.7 C)   Resp: 18      EOMI NCAT S1-S2 no murmur rub or gallop chest clinically clear abdomen soft nontender nondistended no rebound   Discharge Instructions   Discharge Instructions    Diet - low sodium heart healthy  Complete by:  As directed      Discharge instructions    Complete by:  As directed   We have cut back her dose of lisinopril and hydralazine as her blood pressures were low when he changed position and may have a component of a heart valve issue I will ask one of my colleagues with cardiology Dr. Excell Seltzer to see you and  discuss with you options regarding this heart valve issue if there are any in terms of minimally invasive procedure I do think that you are stable to go home he will need labs in about one week     Increase activity slowly    Complete by:  As directed           Current Discharge Medication List    CONTINUE these medications which have CHANGED   Details  hydrALAZINE (APRESOLINE) 25 MG tablet Take 0.5 tablets (12.5 mg total) by mouth every 8 (eight) hours. Qty: 90 tablet, Refills: 1    quinapril (ACCUPRIL) 40 MG tablet Take 0.5 tablets (20 mg total) by mouth daily. Qty: 90 tablet, Refills: 1      CONTINUE these medications which have NOT CHANGED   Details  Artificial Tear GEL Place 2 drops into both eyes 2 (two) times daily.    aspirin EC 81 MG tablet Take 81 mg by mouth every morning.    Calcium Carbonate-Vitamin D (CALCIUM 600 + D PO) Take 1 tablet by mouth every morning.    Cyanocobalamin (VITAMIN B-12) 2500 MCG SUBL Place 1 tablet under the tongue every morning.    furosemide (LASIX) 40 MG tablet TAKE 1/2 TABLET (=20MG )    DAILY Qty: 45 tablet, Refills: 1    Multiple Vitamins-Minerals (PRESERVISION AREDS 2) CAPS Take 1 capsule by mouth daily. Qty: 90 capsule, Refills: 3    nitroGLYCERIN (NITROSTAT) 0.4 MG SL tablet Place 0.4 mg under the tongue every 5 (five) minutes as needed for chest pain.    omeprazole (PRILOSEC) 40 MG capsule Take 1 capsule (40 mg total) by mouth 2 (two) times daily. Qty: 180 capsule, Refills: 1    polyethylene glycol powder (GLYCOLAX/MIRALAX) powder MIX 17GM IN LIQUID AND     DRINK TWO TIMES A DAY Qty: 3162 g, Refills: 3    potassium chloride SA (K-DUR,KLOR-CON) 20 MEQ tablet Take 1 tablet (20 mEq total) by mouth daily. Qty: 90 tablet, Refills: 1    vitamin E (VITAMIN E) 400 UNIT capsule Take 400 Units by mouth every morning.        Allergies  Allergen Reactions  . Codeine Nausea And Vomiting    Violently ill  . Diltiazem Hcl Other (See  Comments)    Not sure about this reaction  . Ibuprofen Nausea And Vomiting  . Tramadol     Auditory and visual hallucinations      The results of significant diagnostics from this hospitalization (including imaging, microbiology, ancillary and laboratory) are listed below for reference.    Significant Diagnostic Studies: Dg Chest 2 View  12/25/2015  CLINICAL DATA:  Fall tonight. Short of breath for 2 weeks. Hypertension. EXAM: CHEST  2 VIEW COMPARISON:  07/21/2015 acute abdomen series. FINDINGS: Osteopenia. Patient rotated right on the frontal radiograph, with convex right thoracic spine curvature. Mild cardiomegaly with transverse aortic atherosclerosis. No pleural effusion or pneumothorax. remote left and possible right sided rib trauma. Moderate left hemidiaphragm elevation. No lobar consolidation. No congestive failure. IMPRESSION: No acute cardiopulmonary disease. Cardiomegaly with moderate left hemidiaphragm elevation. Electronically Signed  By: Jeronimo Greaves M.D.   On: 12/25/2015 02:05   Ct Head Wo Contrast  12/25/2015  CLINICAL DATA:  Status post fall in kitchen, with loss of consciousness. Scalp hematoma and laceration at the occiput. Concern for cervical spine injury. Initial encounter. EXAM: CT HEAD WITHOUT CONTRAST CT CERVICAL SPINE WITHOUT CONTRAST TECHNIQUE: Multidetector CT imaging of the head and cervical spine was performed following the standard protocol without intravenous contrast. Multiplanar CT image reconstructions of the cervical spine were also generated. COMPARISON:  CT of the head and cervical spine performed 12/09/2007 FINDINGS: CT HEAD FINDINGS There is no evidence of acute infarction, mass lesion, or intra- or extra-axial hemorrhage on CT. Prominence of the ventricles and sulci reflects moderate cortical volume loss. Mild cerebellar atrophy is noted. Scattered periventricular and subcortical white matter change likely reflects small vessel ischemic microangiopathy.  Scattered chronic lacunar infarcts are noted at the basal ganglia bilaterally, and at the right cerebellar hemisphere. The brainstem and fourth ventricle are within normal limits. The cerebral hemispheres demonstrate grossly normal gray-white differentiation. No mass effect or midline shift is seen. There is no evidence of fracture; visualized osseous structures are unremarkable in appearance. The visualized portions of the orbits are within normal limits. The paranasal sinuses and mastoid air cells are well-aerated. Soft tissue swelling is noted at the posterior vertex, with associated soft tissue laceration and skin staples. CT CERVICAL SPINE FINDINGS There is no evidence of fracture or subluxation. Vertebral bodies demonstrate normal height and alignment. Multilevel disc space narrowing is noted along the lower cervical and upper thoracic spine, with scattered anterior and posterior disc osteophyte complexes. Prevertebral soft tissues are within normal limits. The thyroid gland is unremarkable in appearance. The visualized lung apices are clear. No significant soft tissue abnormalities are seen. IMPRESSION: 1. No evidence for traumatic intracranial injury or fracture. 2. No evidence of fracture or subluxation along the cervical spine. 3. Moderate cortical volume loss and scattered small vessel ischemic microangiopathy. 4. Scattered chronic lacunar infarcts at the basal ganglia bilaterally, and at the right cerebellar hemisphere. 5. Soft tissue swelling at the posterior vertex, with associated soft tissue laceration and skin staples. 6. Mild degenerative change at the lower cervical and upper thoracic spine. Electronically Signed   By: Roanna Raider M.D.   On: 12/25/2015 01:51   Ct Cervical Spine Wo Contrast  12/25/2015  CLINICAL DATA:  Status post fall in kitchen, with loss of consciousness. Scalp hematoma and laceration at the occiput. Concern for cervical spine injury. Initial encounter. EXAM: CT HEAD  WITHOUT CONTRAST CT CERVICAL SPINE WITHOUT CONTRAST TECHNIQUE: Multidetector CT imaging of the head and cervical spine was performed following the standard protocol without intravenous contrast. Multiplanar CT image reconstructions of the cervical spine were also generated. COMPARISON:  CT of the head and cervical spine performed 12/09/2007 FINDINGS: CT HEAD FINDINGS There is no evidence of acute infarction, mass lesion, or intra- or extra-axial hemorrhage on CT. Prominence of the ventricles and sulci reflects moderate cortical volume loss. Mild cerebellar atrophy is noted. Scattered periventricular and subcortical white matter change likely reflects small vessel ischemic microangiopathy. Scattered chronic lacunar infarcts are noted at the basal ganglia bilaterally, and at the right cerebellar hemisphere. The brainstem and fourth ventricle are within normal limits. The cerebral hemispheres demonstrate grossly normal gray-white differentiation. No mass effect or midline shift is seen. There is no evidence of fracture; visualized osseous structures are unremarkable in appearance. The visualized portions of the orbits are within normal limits. The paranasal sinuses  and mastoid air cells are well-aerated. Soft tissue swelling is noted at the posterior vertex, with associated soft tissue laceration and skin staples. CT CERVICAL SPINE FINDINGS There is no evidence of fracture or subluxation. Vertebral bodies demonstrate normal height and alignment. Multilevel disc space narrowing is noted along the lower cervical and upper thoracic spine, with scattered anterior and posterior disc osteophyte complexes. Prevertebral soft tissues are within normal limits. The thyroid gland is unremarkable in appearance. The visualized lung apices are clear. No significant soft tissue abnormalities are seen. IMPRESSION: 1. No evidence for traumatic intracranial injury or fracture. 2. No evidence of fracture or subluxation along the cervical  spine. 3. Moderate cortical volume loss and scattered small vessel ischemic microangiopathy. 4. Scattered chronic lacunar infarcts at the basal ganglia bilaterally, and at the right cerebellar hemisphere. 5. Soft tissue swelling at the posterior vertex, with associated soft tissue laceration and skin staples. 6. Mild degenerative change at the lower cervical and upper thoracic spine. Electronically Signed   By: Roanna RaiderJeffery  Chang M.D.   On: 12/25/2015 01:51    Microbiology: Recent Results (from the past 240 hour(s))  Urine culture     Status: None (Preliminary result)   Collection Time: 12/25/15 12:41 AM  Result Value Ref Range Status   Specimen Description URINE, RANDOM  Final   Special Requests NONE  Final   Culture >=100,000 COLONIES/mL ESCHERICHIA COLI  Final   Report Status PENDING  Incomplete     Labs: Basic Metabolic Panel:  Recent Labs Lab 12/24/15 2253 12/25/15 0638 12/26/15 0942  NA 143 141 140  K 5.8* 5.6* 4.6  CL 115* 115* 109  CO2 22 20* 23  GLUCOSE 116* 85 106*  BUN 34* 31* 23*  CREATININE 1.45* 1.29* 1.37*  CALCIUM 9.4 8.7* 9.3   Liver Function Tests: No results for input(s): AST, ALT, ALKPHOS, BILITOT, PROT, ALBUMIN in the last 168 hours. No results for input(s): LIPASE, AMYLASE in the last 168 hours. No results for input(s): AMMONIA in the last 168 hours. CBC:  Recent Labs Lab 12/24/15 2253  WBC 6.5  NEUTROABS 5.0  HGB 11.1*  HCT 34.1*  MCV 92.4  PLT 164   Cardiac Enzymes:  Recent Labs Lab 12/24/15 2259 12/25/15 0638 12/25/15 1035 12/25/15 1507 12/25/15 2010  CKTOTAL 67  --   --   --   --   TROPONINI 0.08* 0.29* 0.23* 0.20* 0.15*   BNP: BNP (last 3 results) No results for input(s): BNP in the last 8760 hours.  ProBNP (last 3 results) No results for input(s): PROBNP in the last 8760 hours.  CBG:  Recent Labs Lab 12/25/15 1106  GLUCAP 90       Signed:  Rhetta MuraSAMTANI, JAI-GURMUKH MD   [facility] 12/26/2015, 5:48 PM

## 2015-12-25 NOTE — Progress Notes (Addendum)
2:27 PM I agree with HPI/GPe and A/P per Dr. Maryfrances Bunnellanford  82 y/o ? H/o dysphagia/odynophagia-EGD 2011=stricture-no dilatation, colo 2011-sig diverticulitis htn CKD stg III Normocytic anemia Chr venous insufficiency DVT and PE diagnosed 1998 Neg stress test 2005 Prior SBO ~2012 R lung nodule  Admit 07/21/15-->07/26/15 syncope--felt to be vasovagal, dysphagia felt to be 2/2 to pill/candida esophagitis Had Conservative management at that time and no intervention for dys was done  Admit with fall, syncope Note hyperkalemia--since 11/28/15-slightly peaked T waves no st-t depression on initial EKG Troponin trend 0.08-->0.14--0.29  Other labs stable  CT head chr Microvasc changes Soft tissue sewlling vertex of head   Patient Active Problem List   Diagnosis Date Noted  . Fall 12/25/2015  . Elevated troponin 12/25/2015  . Scalp laceration 12/25/2015  . Hyperkalemia 12/25/2015  . UTI (lower urinary tract infection) 12/25/2015  . Odynophagia 07/23/2015  . CKD (chronic kidney disease), stage III 07/23/2015  . Faintness   . UTI (urinary tract infection) 07/22/2015  . Abdominal pain 07/22/2015  . Nausea with vomiting 07/22/2015  . Syncope 07/21/2015  . Abnormality of gait 01/22/2014  . Partial small bowel obstruction (HCC) 03/09/2013  . Inguinal hernia, left - Contains sigmoid colon.  Reducible 03/09/2013  . Anemia 03/31/2009  . VENOUS STASIS ULCER 07/20/2008  . Osteoarthritis 05/06/2007  . Essential hypertension 04/24/2007  . GERD 04/24/2007    Aide tells me when she got up at church last week or when she walks quikcly she feels "dizzy"/SOB  Alert oriented  IV/VI sys murmur across pre-cordium Troponin non-concerning in renal insuff--no cp at all   P Get echo-if severe AoS, ? TAVR as high function lady-??should we Cc Dr. Excell Seltzerooper to see if he agrees? Appreciate Cardiology input  Pleas KochJai Osceola Holian, MD [facility] [phone]

## 2015-12-25 NOTE — Consult Note (Signed)
Cardiologist: New Reason for Consult: Elevated troponin Referring Physician:   KRISHANA LUTZE is an 82 y.o. female.  HPI:   Patient is a 82 year old female with history of anemia hypertension, DVT chronic kidney disease. No prior cardiac history.  Echocardiogram 07/14/2015 revealed an ejection fraction 55-60% with normal wall motion. Grade 1 diastolic dysfunction. Mild aortic stenosis and trivial regurgitation. Left atrium severely dilated. Peak PA pressure 51 mmHg.  Patient presented after a fall. She said she was getting up from the table in the kitchen and suddenly fell backwards. She denies passing out. She does have frequent episodes where she starts shaking uncontrollably. This usually occurs when she is hypoglycemic. She had an episode while was in the room blood sugar was 90. blood pressure controlled.  Oxygen saturations on room air were 95%.  She says she pants frequently when she exerts herself but otherwise denies any chest tightness pressure or pain. She does not remember getting particularly dizzy prior to falling.  She is also prone to muscle cramps in her legs. She does feel she's been more short of breath in the last month. The patient currently denies nausea, vomiting, fever,  orthopnea, PND, cough, congestion, abdominal pain, hematochezia, melena, lower extremity edema, claudication.   Past Medical History  Diagnosis Date  . ANEMIA 03/31/2009  . DEGENERATIVE JOINT DISEASE, GENERALIZED 05/06/2007  . GERD 04/24/2007  . HYPERTENSION 04/24/2007  . VENOUS STASIS ULCER 07/20/2008  . DVT (deep venous thrombosis) (HCC)     hx of  . Chronic kidney disease   . Diverticulosis of sigmoid colon     Colonoscopy 2011    Past Surgical History  Procedure Laterality Date  . Cataract extraction    . Abdominal hysterectomy      Completion hysterectomy  . Total knee arthroplasty      x2  . Hernia repair    . Cholecystectomy open    . Partial hysterectomy      Salpingo-oophorectomy as  well    History reviewed. No pertinent family history.  Social History:  reports that she has never smoked. She has never used smokeless tobacco. She reports that she does not drink alcohol or use illicit drugs.  Allergies:  Allergies  Allergen Reactions  . Codeine Nausea And Vomiting    Violently ill  . Diltiazem Hcl Other (See Comments)    Not sure about this reaction  . Ibuprofen Nausea And Vomiting  . Tramadol     Auditory and visual hallucinations    Medications: Scheduled Meds: . aspirin EC  81 mg Oral q morning - 10a  . [START ON 12/26/2015] furosemide  20 mg Oral Daily  . hydrALAZINE  25 mg Oral 3 times per day  . pantoprazole  80 mg Oral Daily  . polyethylene glycol  17 g Oral Daily   Continuous Infusions:  PRN Meds:.acetaminophen, fentaNYL (SUBLIMAZE) injection, gi cocktail, ondansetron (ZOFRAN) IV, oxyCODONE   Results for orders placed or performed during the hospital encounter of 12/24/15 (from the past 48 hour(s))  CBC with Differential/Platelet     Status: Abnormal   Collection Time: 12/24/15 10:53 PM  Result Value Ref Range   WBC 6.5 4.0 - 10.5 K/uL   RBC 3.69 (L) 3.87 - 5.11 MIL/uL   Hemoglobin 11.1 (L) 12.0 - 15.0 g/dL   HCT 34.1 (L) 36.0 - 46.0 %   MCV 92.4 78.0 - 100.0 fL   MCH 30.1 26.0 - 34.0 pg   MCHC 32.6 30.0 - 36.0 g/dL  RDW 13.9 11.5 - 15.5 %   Platelets 164 150 - 400 K/uL   Neutrophils Relative % 78 %   Neutro Abs 5.0 1.7 - 7.7 K/uL   Lymphocytes Relative 13 %   Lymphs Abs 0.9 0.7 - 4.0 K/uL   Monocytes Relative 8 %   Monocytes Absolute 0.5 0.1 - 1.0 K/uL   Eosinophils Relative 1 %   Eosinophils Absolute 0.1 0.0 - 0.7 K/uL   Basophils Relative 0 %   Basophils Absolute 0.0 0.0 - 0.1 K/uL  Basic metabolic panel     Status: Abnormal   Collection Time: 12/24/15 10:53 PM  Result Value Ref Range   Sodium 143 135 - 145 mmol/L   Potassium 5.8 (H) 3.5 - 5.1 mmol/L   Chloride 115 (H) 101 - 111 mmol/L   CO2 22 22 - 32 mmol/L   Glucose, Bld  116 (H) 65 - 99 mg/dL   BUN 34 (H) 6 - 20 mg/dL   Creatinine, Ser 1.45 (H) 0.44 - 1.00 mg/dL   Calcium 9.4 8.9 - 10.3 mg/dL   GFR calc non Af Amer 30 (L) >60 mL/min   GFR calc Af Amer 34 (L) >60 mL/min    Comment: (NOTE) The eGFR has been calculated using the CKD EPI equation. This calculation has not been validated in all clinical situations. eGFR's persistently <60 mL/min signify possible Chronic Kidney Disease.    Anion gap 6 5 - 15  CK     Status: None   Collection Time: 12/24/15 10:59 PM  Result Value Ref Range   Total CK 67 38 - 234 U/L  Troponin I     Status: Abnormal   Collection Time: 12/24/15 10:59 PM  Result Value Ref Range   Troponin I 0.08 (H) <0.031 ng/mL    Comment:        PERSISTENTLY INCREASED TROPONIN VALUES IN THE RANGE OF 0.04-0.49 ng/mL CAN BE SEEN IN:       -UNSTABLE ANGINA       -CONGESTIVE HEART FAILURE       -MYOCARDITIS       -CHEST TRAUMA       -ARRYHTHMIAS       -LATE PRESENTING MYOCARDIAL INFARCTION       -COPD   CLINICAL FOLLOW-UP RECOMMENDED.   I-stat troponin, ED     Status: Abnormal   Collection Time: 12/24/15 11:00 PM  Result Value Ref Range   Troponin i, poc 0.14 (HH) 0.00 - 0.08 ng/mL   Comment NOTIFIED PHYSICIAN    Comment 3            Comment: Due to the release kinetics of cTnI, a negative result within the first hours of the onset of symptoms does not rule out myocardial infarction with certainty. If myocardial infarction is still suspected, repeat the test at appropriate intervals.   Urinalysis, Routine w reflex microscopic (not at [facility])     Status: Abnormal   Collection Time: 12/25/15 12:41 AM  Result Value Ref Range   Color, Urine YELLOW YELLOW   APPearance CLOUDY (A) CLEAR   Specific Gravity, Urine 1.014 1.005 - 1.030   pH 5.5 5.0 - 8.0   Glucose, UA NEGATIVE NEGATIVE mg/dL   Hgb urine dipstick NEGATIVE NEGATIVE   Bilirubin Urine NEGATIVE NEGATIVE   Ketones, ur NEGATIVE NEGATIVE mg/dL   Protein, ur NEGATIVE NEGATIVE  mg/dL   Nitrite NEGATIVE NEGATIVE   Leukocytes, UA MODERATE (A) NEGATIVE  Urine microscopic-add on     Status: Abnormal  Collection Time: 12/25/15 12:41 AM  Result Value Ref Range   Squamous Epithelial / LPF 0-5 (A) NONE SEEN   WBC, UA 6-30 0 - 5 WBC/hpf   RBC / HPF NONE SEEN 0 - 5 RBC/hpf   Bacteria, UA MANY (A) NONE SEEN  Basic metabolic panel     Status: Abnormal   Collection Time: 12/25/15  6:38 AM  Result Value Ref Range   Sodium 141 135 - 145 mmol/L   Potassium 5.6 (H) 3.5 - 5.1 mmol/L   Chloride 115 (H) 101 - 111 mmol/L   CO2 20 (L) 22 - 32 mmol/L   Glucose, Bld 85 65 - 99 mg/dL   BUN 31 (H) 6 - 20 mg/dL   Creatinine, Ser 1.29 (H) 0.44 - 1.00 mg/dL   Calcium 8.7 (L) 8.9 - 10.3 mg/dL   GFR calc non Af Amer 34 (L) >60 mL/min   GFR calc Af Amer 39 (L) >60 mL/min    Comment: (NOTE) The eGFR has been calculated using the CKD EPI equation. This calculation has not been validated in all clinical situations. eGFR's persistently <60 mL/min signify possible Chronic Kidney Disease.    Anion gap 6 5 - 15  Troponin I-serum (0, 3, 6 hours)     Status: Abnormal   Collection Time: 12/25/15  6:38 AM  Result Value Ref Range   Troponin I 0.29 (H) <0.031 ng/mL    Comment:        PERSISTENTLY INCREASED TROPONIN VALUES IN THE RANGE OF 0.04-0.49 ng/mL CAN BE SEEN IN:       -UNSTABLE ANGINA       -CONGESTIVE HEART FAILURE       -MYOCARDITIS       -CHEST TRAUMA       -ARRYHTHMIAS       -LATE PRESENTING MYOCARDIAL INFARCTION       -COPD   CLINICAL FOLLOW-UP RECOMMENDED.     Dg Chest 2 View  12/25/2015  CLINICAL DATA:  Fall tonight. Short of breath for 2 weeks. Hypertension. EXAM: CHEST  2 VIEW COMPARISON:  07/21/2015 acute abdomen series. FINDINGS: Osteopenia. Patient rotated right on the frontal radiograph, with convex right thoracic spine curvature. Mild cardiomegaly with transverse aortic atherosclerosis. No pleural effusion or pneumothorax. remote left and possible right sided rib  trauma. Moderate left hemidiaphragm elevation. No lobar consolidation. No congestive failure. IMPRESSION: No acute cardiopulmonary disease. Cardiomegaly with moderate left hemidiaphragm elevation. Electronically Signed   By: Abigail Miyamoto M.D.   On: 12/25/2015 02:05   Ct Head Wo Contrast  12/25/2015  CLINICAL DATA:  Status post fall in kitchen, with loss of consciousness. Scalp hematoma and laceration at the occiput. Concern for cervical spine injury. Initial encounter. EXAM: CT HEAD WITHOUT CONTRAST CT CERVICAL SPINE WITHOUT CONTRAST TECHNIQUE: Multidetector CT imaging of the head and cervical spine was performed following the standard protocol without intravenous contrast. Multiplanar CT image reconstructions of the cervical spine were also generated. COMPARISON:  CT of the head and cervical spine performed 12/09/2007 FINDINGS: CT HEAD FINDINGS There is no evidence of acute infarction, mass lesion, or intra- or extra-axial hemorrhage on CT. Prominence of the ventricles and sulci reflects moderate cortical volume loss. Mild cerebellar atrophy is noted. Scattered periventricular and subcortical white matter change likely reflects small vessel ischemic microangiopathy. Scattered chronic lacunar infarcts are noted at the basal ganglia bilaterally, and at the right cerebellar hemisphere. The brainstem and fourth ventricle are within normal limits. The cerebral hemispheres demonstrate grossly normal gray-white differentiation.  No mass effect or midline shift is seen. There is no evidence of fracture; visualized osseous structures are unremarkable in appearance. The visualized portions of the orbits are within normal limits. The paranasal sinuses and mastoid air cells are well-aerated. Soft tissue swelling is noted at the posterior vertex, with associated soft tissue laceration and skin staples. CT CERVICAL SPINE FINDINGS There is no evidence of fracture or subluxation. Vertebral bodies demonstrate normal height and  alignment. Multilevel disc space narrowing is noted along the lower cervical and upper thoracic spine, with scattered anterior and posterior disc osteophyte complexes. Prevertebral soft tissues are within normal limits. The thyroid gland is unremarkable in appearance. The visualized lung apices are clear. No significant soft tissue abnormalities are seen. IMPRESSION: 1. No evidence for traumatic intracranial injury or fracture. 2. No evidence of fracture or subluxation along the cervical spine. 3. Moderate cortical volume loss and scattered small vessel ischemic microangiopathy. 4. Scattered chronic lacunar infarcts at the basal ganglia bilaterally, and at the right cerebellar hemisphere. 5. Soft tissue swelling at the posterior vertex, with associated soft tissue laceration and skin staples. 6. Mild degenerative change at the lower cervical and upper thoracic spine. Electronically Signed   By: Garald Balding M.D.   On: 12/25/2015 01:51   Ct Cervical Spine Wo Contrast  12/25/2015  CLINICAL DATA:  Status post fall in kitchen, with loss of consciousness. Scalp hematoma and laceration at the occiput. Concern for cervical spine injury. Initial encounter. EXAM: CT HEAD WITHOUT CONTRAST CT CERVICAL SPINE WITHOUT CONTRAST TECHNIQUE: Multidetector CT imaging of the head and cervical spine was performed following the standard protocol without intravenous contrast. Multiplanar CT image reconstructions of the cervical spine were also generated. COMPARISON:  CT of the head and cervical spine performed 12/09/2007 FINDINGS: CT HEAD FINDINGS There is no evidence of acute infarction, mass lesion, or intra- or extra-axial hemorrhage on CT. Prominence of the ventricles and sulci reflects moderate cortical volume loss. Mild cerebellar atrophy is noted. Scattered periventricular and subcortical white matter change likely reflects small vessel ischemic microangiopathy. Scattered chronic lacunar infarcts are noted at the basal ganglia  bilaterally, and at the right cerebellar hemisphere. The brainstem and fourth ventricle are within normal limits. The cerebral hemispheres demonstrate grossly normal gray-white differentiation. No mass effect or midline shift is seen. There is no evidence of fracture; visualized osseous structures are unremarkable in appearance. The visualized portions of the orbits are within normal limits. The paranasal sinuses and mastoid air cells are well-aerated. Soft tissue swelling is noted at the posterior vertex, with associated soft tissue laceration and skin staples. CT CERVICAL SPINE FINDINGS There is no evidence of fracture or subluxation. Vertebral bodies demonstrate normal height and alignment. Multilevel disc space narrowing is noted along the lower cervical and upper thoracic spine, with scattered anterior and posterior disc osteophyte complexes. Prevertebral soft tissues are within normal limits. The thyroid gland is unremarkable in appearance. The visualized lung apices are clear. No significant soft tissue abnormalities are seen. IMPRESSION: 1. No evidence for traumatic intracranial injury or fracture. 2. No evidence of fracture or subluxation along the cervical spine. 3. Moderate cortical volume loss and scattered small vessel ischemic microangiopathy. 4. Scattered chronic lacunar infarcts at the basal ganglia bilaterally, and at the right cerebellar hemisphere. 5. Soft tissue swelling at the posterior vertex, with associated soft tissue laceration and skin staples. 6. Mild degenerative change at the lower cervical and upper thoracic spine. Electronically Signed   By: Garald Balding M.D.   On:  12/25/2015 01:51    Review of Systems  Constitutional: Negative for fever and diaphoresis.  HENT: Negative for congestion and sore throat.   Respiratory: Positive for shortness of breath.   Cardiovascular: Positive for leg swelling. Negative for chest pain, palpitations, orthopnea and PND.  Gastrointestinal:  Positive for constipation. Negative for nausea, vomiting, abdominal pain, diarrhea, blood in stool and melena.  Musculoskeletal: Positive for myalgias.       Posterior head pain from trauma  Neurological: Positive for tremors and weakness. Negative for dizziness.  All other systems reviewed and are negative.  Blood pressure 115/41, pulse 64, temperature 98.3 F (36.8 C), temperature source Oral, resp. rate 16, height '4\' 11"'  (1.499 m), weight 109 lb 4.8 oz (49.578 kg), SpO2 91 %. Physical Exam  Nursing note and vitals reviewed. Constitutional: She is oriented to person, place, and time. She appears well-developed and well-nourished. No distress.  82 year old frail-appearing female was having bilateral leg cramps when I came in the room.  HENT:  Head: Normocephalic.  Abrasion/laceration to the posterior occipital area  Eyes: EOM are normal. Pupils are equal, round, and reactive to light.  Neck: Normal range of motion. Neck supple. No JVD present.  Cardiovascular: Normal rate, regular rhythm, S1 normal and S2 normal.   Murmur heard.  Systolic murmur is present with a grade of 1/6  Pulses:      Radial pulses are 2+ on the right side, and 2+ on the left side.  Respiratory: Effort normal and breath sounds normal.  GI: Soft. Bowel sounds are normal. She exhibits no distension. There is no tenderness.  Musculoskeletal: She exhibits no edema.  Lymphadenopathy:    She has no cervical adenopathy.  Neurological: She is alert and oriented to person, place, and time.  Very weak  Skin: Skin is warm and dry.  Psychiatric: She has a normal mood and affect.    Assessment/Plan: Active Problems:   Anemia   Essential hypertension   CKD (chronic kidney disease), stage III   Fall   Elevated troponin   Scalp laceration   Hyperkalemia   UTI (lower urinary tract infection)  82 year old female with history of anemia hypertension, DVT chronic kidney disease. No prior cardiac history. Echocardiogram  07/14/2015 revealed an ejection fraction 55-60% with normal wall motion. Grade 1 diastolic dysfunction. Mild aortic stenosis and trivial regurgitation. Left atrium severely dilated. Peak PA pressure 51 mmHg. She presented after a fall injuring the back of her head.  Troponin 0.29.  No CP.  EKG nonischemic.  We will check echo to help guide treatment, however, ischemic evaluation is not necessary.  This was discussed with the patient.  She understands her age is a factor.    Tarri Fuller, [facility] 12/25/2015, 10:44 AM   Patient seen and examined with Tarri Fuller, PA-C. We discussed all aspects of the encounter. I agree with the assessment and plan as stated above.   Despite mildly elevated troponin doubt symptoms are cardiac in nature. Will repeat echo for completeness sake. If unchanged will not pursue any further cardiac work-up. Agree no ischemic work-up needed at this point given advanced age and non-cardiac nature of symptoms. We will sign off pending echo results.   Bensimhon, Daniel,MD 11:39 AM

## 2015-12-25 NOTE — H&P (Signed)
History and Physical  Patient Name: Erin Gibson     AOZ:[id number]    DOB: [birth year]    [name]: 12/24/2015 Referring physician: Rochele Raring, MD PCP: Rogelia Boga, MD      Chief Complaint: Fall  HPI: Erin Gibson is a 82 y.o. female with a past medical history significant for CKD, HTN, IDA, and scoliosis who presents with fall and head laceration.  The patient lives alone. She was in her kitchen tonight finishing supper when she stood up to walk from her chair to the sink and fell.  She felt no preceding dizziness, lightheadedness, swimmy headedness, chest pressure, palpitations, weakness. She thinks she struck her head on the table. She had a large bleeding laceration from the back of her head. She is not sure if she lost consciousness, or if she tripped over anything. She was not using her walker at the time. She is unable to stand by herself, so she crawled around the kitchen, she estimates for half an hour to an hour, before crawling to the sun room and finally knocking the phone off the hook to call her neighbor.  In the ED, she was hemodynamically stable and afebrile.  Na 143, K 5.8, Cr 1.45 (baseline 1.2), elevated BUN to creatinine ratio, WBC 6K, Hgb 11, CK 67.  The serum troponin was elevated.  CT of the head and Cspine were unremarkable.  The ECG showed a sinus rhythm with no ischemic changes. Her laceration was repaired in the ER and TRH was asked to evaluate for admission.  She had a fall last October, during which it was thought to be vasovagal in nature, she had no findings on telemetry or echocardiogram, and was seen by GI for some pain with swallowing (ultimately felt to be esophagitis from pill versus erosive versus fungal).    Review of Systems:  Pt complains of head pain. All other systems negative except as just noted or noted in the history of present illness.  Allergies  Allergen Reactions  . Codeine Nausea And Vomiting    Violently ill  . Diltiazem Hcl  Other (See Comments)    Not sure about this reaction  . Ibuprofen Nausea And Vomiting  . Tramadol     Auditory and visual hallucinations    Prior to Admission medications   Medication Sig Start Date End Date Taking? Authorizing Provider  Artificial Tear GEL Place 2 drops into both eyes 2 (two) times daily.   Yes Historical Provider, MD  aspirin EC 81 MG tablet Take 81 mg by mouth every morning.   Yes Historical Provider, MD  Calcium Carbonate-Vitamin D (CALCIUM 600 + D PO) Take 1 tablet by mouth every morning.   Yes Historical Provider, MD  Cyanocobalamin (VITAMIN B-12) 2500 MCG SUBL Place 1 tablet under the tongue every morning.   Yes Historical Provider, MD  furosemide (LASIX) 40 MG tablet TAKE 1/2 TABLET (=20MG )    DAILY 10/06/15  Yes Gordy Savers, MD  hydrALAZINE (APRESOLINE) 25 MG tablet Take 1 tablet (25 mg total) by mouth every 8 (eight) hours. 10/06/15  Yes Gordy Savers, MD  Multiple Vitamins-Minerals (PRESERVISION AREDS 2) CAPS Take 1 capsule by mouth daily. 10/06/15  Yes Gordy Savers, MD  nitroGLYCERIN (NITROSTAT) 0.4 MG SL tablet Place 0.4 mg under the tongue every 5 (five) minutes as needed for chest pain.   Yes Historical Provider, MD  omeprazole (PRILOSEC) 40 MG capsule Take 1 capsule (40 mg total) by mouth 2 (two) times daily.  Patient taking differently: Take 40 mg by mouth every morning.  10/06/15  Yes Gordy Savers, MD  polyethylene glycol powder (GLYCOLAX/MIRALAX) powder MIX 17GM IN LIQUID AND     DRINK TWO TIMES A DAY 10/06/15  Yes Gordy Savers, MD  potassium chloride SA (K-DUR,KLOR-CON) 20 MEQ tablet Take 1 tablet (20 mEq total) by mouth daily. 10/06/15  Yes Gordy Savers, MD  quinapril (ACCUPRIL) 40 MG tablet Take 1 tablet (40 mg total) by mouth daily. 10/06/15  Yes Gordy Savers, MD  vitamin E (VITAMIN E) 400 UNIT capsule Take 400 Units by mouth every morning.    Yes Historical Provider, MD    Past Medical History  Diagnosis Date    . ANEMIA 03/31/2009  . DEGENERATIVE JOINT DISEASE, GENERALIZED 05/06/2007  . GERD 04/24/2007  . HYPERTENSION 04/24/2007  . VENOUS STASIS ULCER 07/20/2008  . DVT (deep venous thrombosis) (HCC)     hx of  . Chronic kidney disease   . Diverticulosis of sigmoid colon     Colonoscopy 2011    Past Surgical History  Procedure Laterality Date  . Cataract extraction    . Abdominal hysterectomy      Completion hysterectomy  . Total knee arthroplasty      x2  . Hernia repair    . Cholecystectomy open    . Partial hysterectomy      Salpingo-oophorectomy as well    Family history: No heart disease or cancer.  Social History: Patient lives alone.  She uses a walker. She has no dementia and is independent with all ADLs and IADLs. She does not smoke.         Physical Exam: BP 144/55 mmHg  Pulse 65  Temp(Src) 97.8 F (36.6 C) (Oral)  Resp 17  Ht  (1.499 m)  Wt 49.578 kg (109 lb 4.8 oz)  BMI 22.06 kg/m2  SpO2 92% General appearance: Frail elderly female, alert and in no acute distress.   Eyes: Anicteric, conjunctiva pink, lids and lashes normal.     ENT: No nasal deformity, discharge, or epistaxis.  OP moist without lesions.   Skin: Warm and dry.  Repaired laceration to posterior scalp.   Cardiac: RRR, nl S1-S2, loud SEM.  Capillary refill is brisk.  JVP normal.  No LE edema.  Radial and DP pulses 2+ and symmetric. Respiratory: Normal respiratory rate and rhythm.  CTAB without rales or wheezes. Abdomen: Abdomen soft without rigidity.  Mild diffuse TTP. No ascites, distension.   MSK: No deformities or effusions. Neuro: Cranial nerves normal.  Sensorium intact and responding to questions, attention normal.  Speech is fluent.  Moves all extremities equally (limited by scoliosis) and with normal coordination.    Psych: Behavior appropriate.  Affect pleasant.  No evidence of aural or visual hallucinations or delusions.       Labs on Admission:  The metabolic panel shows  hyperkalemia and elevated BUN to creatinine and slightly elevated Cr from baseline, without AKI. The complete blood count shows no leukocytosis.  Chronic stable anemia. Elevated troponin. [place] CK. Pyuria with bacteriuria.   Radiological Exams on Admission: Personally reviewed: Dg Chest 2 View 12/25/2015  IMPRESSION: No acute cardiopulmonary disease. Cardiomegaly with moderate left hemidiaphragm elevation.    Ct Head and CPSINE  Wo Contrast 12/25/2015  IMPRESSION: 1. No evidence for traumatic intracranial injury or fracture. 2. No evidence of fracture or subluxation along the cervical spine. 3. Moderate cortical volume loss and scattered small vessel ischemic microangiopathy. 4. Scattered  chronic lacunar infarcts at the basal ganglia bilaterally, and at the right cerebellar hemisphere. 5. Soft tissue swelling at the posterior vertex, with associated soft tissue laceration and skin staples. 6. Mild degenerative change at the lower cervical and upper thoracic spine.     EKG: Independently reviewed. Rate 62, sinus, QTc 411, no ST changes.   Echocardiogram Oct 2016: The estimated ejection fraction was in the range of 55%   to 60%. Wall motion was normal; there were no regional wall  motion abnormalities.  (grade 1 diastolic dysfunction). - Aortic valve: There was mild stenosis. - Pulmonary arteries: PA peak pressure: 51 mm Hg (S).     Assessment/Plan 1. Elevated troponin:  This is new.   Suspect this is demand, as the patient has no ECG changes and no pain or dyspnea. -Trend troponin -Consult to Cardiology, appreciate cares   2. Scalp laceration:  This is new.   -May wash with soap and water tomorrow -Cover for now -Acetaminophen for pain, fentanyl for severe pain  3. Fall:  Vasovagal episode in fall.  On telemetry now for troponin.   -Repeat orthostatics  4. Hyperkalemia:  Fluids given in ER. -Repeat BMP and kayexalate if persistent  5. CKD stage III:  -Avoid  nephrotoxins/NSAIDs  6. Pyuria:  No urinary symptoms.  Defer treatment for now.  7. HTN:  -Hold ACEi given K -Continue hydralazine -Restart furosemide tomorrow -Continue aspirin     DVT PPx: SCDs Diet: Heart healthy Consultants: Cardiology Code Status: DNR Family Communication: Daughter at bedside  Medical decision making: What exists of the patient's previous chart was reviewed in depth and the case was discussed with Dr. Elesa MassedWard. Patient seen 5:36 AM on 12/25/2015.  Disposition Plan:  I recommend admission to telemetry, observation status.  Clinical condition: stable.  Anticipate trend troponins, if decreasing, expect discharge tonight or tomorrow, depending on need for PT eval.      Alberteen Samhristopher P Danford [facility] Pager [phone]

## 2015-12-26 ENCOUNTER — Ambulatory Visit (HOSPITAL_BASED_OUTPATIENT_CLINIC_OR_DEPARTMENT_OTHER): Payer: Medicare Other

## 2015-12-26 DIAGNOSIS — I361 Nonrheumatic tricuspid (valve) insufficiency: Secondary | ICD-10-CM | POA: Diagnosis not present

## 2015-12-26 LAB — BASIC METABOLIC PANEL
Anion gap: 8 (ref 5–15)
BUN: 23 mg/dL — AB (ref 6–20)
CHLORIDE: 109 mmol/L (ref 101–111)
CO2: 23 mmol/L (ref 22–32)
Calcium: 9.3 mg/dL (ref 8.9–10.3)
Creatinine, Ser: 1.37 mg/dL — ABNORMAL HIGH (ref 0.44–1.00)
GFR calc Af Amer: 37 mL/min — ABNORMAL LOW (ref >60)
GFR calc non Af Amer: 32 mL/min — ABNORMAL LOW (ref >60)
GLUCOSE: 106 mg/dL — AB (ref 65–99)
POTASSIUM: 4.6 mmol/L (ref 3.5–5.1)
Sodium: 140 mmol/L (ref 135–145)

## 2015-12-26 LAB — ECHOCARDIOGRAM COMPLETE
Height: 59 in
WEIGHTICAEL: 1729.6 [oz_av]

## 2015-12-26 MED ORDER — QUINAPRIL HCL 40 MG PO TABS: 20.0000 mg | ORAL_TABLET | Freq: Every day | ORAL | Status: DC

## 2015-12-26 MED ORDER — HYDRALAZINE HCL 25 MG PO TABS
12.5000 mg | ORAL_TABLET | Freq: Three times a day (TID) | ORAL | Status: DC
Start: 2015-12-26 — End: 2015-12-27
  Administered 2015-12-26 – 2015-12-27 (×3): 12.5 mg via ORAL
  Filled 2015-12-26 (×3): qty 1

## 2015-12-26 MED ORDER — HYDRALAZINE HCL 25 MG PO TABS: 12.5000 mg | ORAL_TABLET | Freq: Three times a day (TID) | ORAL | Status: DC

## 2015-12-26 NOTE — Progress Notes (Signed)
  Echocardiogram 2D Echocardiogram has been performed.  Jansel Vonstein 12/26/2015, 12:20 PM

## 2015-12-27 ENCOUNTER — Encounter (HOSPITAL_COMMUNITY): Payer: Self-pay | Admitting: General Practice

## 2015-12-27 ENCOUNTER — Telehealth: Payer: Self-pay

## 2015-12-27 DIAGNOSIS — N183 Chronic kidney disease, stage 3 (moderate): Secondary | ICD-10-CM | POA: Diagnosis not present

## 2015-12-27 DIAGNOSIS — I350 Nonrheumatic aortic (valve) stenosis: Secondary | ICD-10-CM

## 2015-12-27 LAB — URINE CULTURE: Culture: >=100000

## 2015-12-27 NOTE — Care Management Note (Signed)
Case Management Note  Patient Details  Name: Erin Gibson MRN: [id number] Date of Birth: 06/01/1919  Subjective/Objective:     Admitted with Fall               Action/Plan: Talked to patient's daughter Dondra SpryGail with her permission. She is arranging for 24 hr care for her at home. HHC choice offered, [facility]. Dondra SpryGail stated that she does not need any equipment at this time.  Expected Discharge Date:    12/27/2015              Expected Discharge Plan:  Home w Home Health Services  Discharge planning Services  CM Consult Choice offered to:  Adult Children  HH Arranged:  RN, PT, OT, Nurse's Aide HH Agency:  [facility]  Status of Service:  In process, will continue to follow  Reola MosherChandler, Deaun Rocha L, RN,MHA,BSN [phone] 12/27/2015, 3:02 PM

## 2015-12-27 NOTE — Evaluation (Signed)
Physical Therapy Evaluation Patient Details Name: Erin CirriViolet W Gibson MRN: [id number] DOB: 02/24/1919 Today's Date: 12/27/2015   History of Present Illness  Pt is a 82 y.o. female who fell at home due to a believed syncopal event, felt to be vasovagal. PMH:  anemia, TKA X2, hypertension.    Clinical Impression  Pt admitted with above diagnosis. Pt currently with functional limitations due to the deficits listed below (see PT Problem List). Pt will benefit from skilled PT to increase their independence and safety with mobility. Pt reports that she will have 24 hour assistance at home upon D/C . Started gaze stabilization exercises and handout provided. No family present to review at this time. Recommending HHPT upon D/C.        Follow Up Recommendations Home health PT;Supervision/Assistance - 24 hour    Equipment Recommendations  None recommended by PT    Recommendations for Other Services       Precautions / Restrictions Precautions Precautions: Fall Restrictions Weight Bearing Restrictions: No      Mobility  Bed Mobility               General bed mobility comments: pt up in chair upon arrival  Transfers Overall transfer level: Needs assistance Equipment used: Rolling walker (2 wheeled) Transfers: Sit to/from Stand Sit to Stand: Min assist;Mod assist         General transfer comment: Mod assist with initial standing but improving with subsequent attempts to min assist. Additional time is needed to perform transfer. Pt with significant instability with initial stand but improving with time.   Ambulation/Gait Ambulation/Gait assistance: Min assist Ambulation Distance (Feet): 24 Feet (12' X2) Assistive device: Rolling walker (2 wheeled) Gait Pattern/deviations: Step-through pattern;Decreased step length - right;Decreased step length - left;Trunk flexed;Narrow base of support Gait velocity: decreased   General Gait Details: Pt limited by fatigue during ambulation. Seated  rest taken between ambulation attempts.   Stairs            Wheelchair Mobility    Modified Rankin (Stroke Patients Only)       Balance Overall balance assessment: Needs assistance Sitting-balance support: No upper extremity supported Sitting balance-Leahy Scale: Good     Standing balance support: Bilateral upper extremity supported Standing balance-Leahy Scale: Poor Standing balance comment: requiring rw and physical assist with initial stand                             Pertinent Vitals/Pain Pain Assessment: No/denies pain    Home Living Family/patient expects to be discharged to:: Private residence Living Arrangements: Alone Available Help at Discharge: Home health;Available 24 hours/day Type of Home: House Home Access: Level entry     Home Layout: One level Home Equipment: Walker - 2 wheels      Prior Function Level of Independence: Independent with assistive device(s)         Comments: pt reports doing all of her ADLs independently PTA.      Hand Dominance        Extremity/Trunk Assessment   Upper Extremity Assessment: Generalized weakness           Lower Extremity Assessment: Generalized weakness         Communication   Communication: No difficulties  Cognition Arousal/Alertness: Awake/alert Behavior During Therapy: WFL for tasks assessed/performed Overall Cognitive Status: Within Functional Limits for tasks assessed  General Comments General comments (skin integrity, edema, etc.): Vestibular activities revealing reproduction of dizziness with head rotation bilaterally and present but less intense with cervical flexion/extension. No noted nystagmus noted with visual pursuit. Pt denies any sensation of room spinning at rest or with sit/stand. Dizziness reproduced primarily with head rotation.  Time spent on education on having assistance with transfers and all mobility at home. Pt verbalized  understanding.     Exercises Other Exercises Other Exercises: gaze stabilization with static reference point and head rotation bilaterally and flexion/extension. Handout provided.       Assessment/Plan    PT Assessment Patient needs continued PT services  PT Diagnosis Difficulty walking;Generalized weakness   PT Problem List Decreased strength;Decreased range of motion;Decreased activity tolerance;Decreased balance;Decreased mobility;Decreased safety awareness  PT Treatment Interventions DME instruction;Gait training;Functional mobility training;Therapeutic activities;Therapeutic exercise;Balance training;Neuromuscular re-education;Cognitive remediation;Patient/family education   PT Goals (Current goals can be found in the Care Plan section) Acute Rehab PT Goals Patient Stated Goal: go home PT Goal Formulation: With patient Time For Goal Achievement: 01/10/16 Potential to Achieve Goals: Good    Frequency Min 3X/week   Barriers to discharge        Co-evaluation               End of Session Equipment Utilized During Treatment: Gait belt Activity Tolerance: Patient tolerated treatment well;Patient limited by fatigue Patient left: in chair;with call bell/phone within reach;with chair alarm set Nurse Communication: Mobility status    Functional Assessment Tool Used: clinical judgment Functional Limitation: Mobility: Walking and moving around Mobility: Walking and Moving Around Current Status [phone]): At least 60 percent but less than 80 percent impaired, limited or restricted Mobility: Walking and Moving Around Goal Status [phone]): At least 40 percent but less than 60 percent impaired, limited or restricted    Time: 0917-0942 PT Time Calculation (min) (ACUTE ONLY): 25 min   Charges:   PT Evaluation $PT Eval Moderate Complexity: 1 Procedure PT Treatments $Therapeutic Exercise: 8-22 mins   PT G Codes:   PT G-Codes **NOT FOR INPATIENT CLASS** Functional Assessment  Tool Used: clinical judgment Functional Limitation: Mobility: Walking and moving around Mobility: Walking and Moving Around Current Status ([id number]): At least 60 percent but less than 80 percent impaired, limited or restricted Mobility: Walking and Moving Around Goal Status [phone]): At least 40 percent but less than 60 percent impaired, limited or restricted    Christiane Ha, PT, CSCS Pager [phone] Office [phone]  12/27/2015, 10:03 AM

## 2015-12-27 NOTE — Progress Notes (Deleted)
Pt arrived on [address]; pt has been oriented to room, surroundings, and any applicable equipment. VSS.  CCMD notified of telemetry. Pt has been educated on the Falls Prevention Plan.  Pt in stable condition. 

## 2015-12-27 NOTE — Progress Notes (Signed)
Seen examined and dc done on 12/26/15  No changes hh ordered  D/c home

## 2015-12-27 NOTE — Progress Notes (Signed)
Orders received for pt discharge.  Discharge summary printed and reviewed with pt.  Explained medication regimen, and pt had no further questions at this time.  IV removed and site remains clean, dry, intact.  Telemetry removed.  Pt in stable condition and awaiting transport. 

## 2015-12-27 NOTE — Telephone Encounter (Addendum)
Per Dr. Eden EmmsNishan, needs f/u echo for AS in 6 months and f/u DB. Ordered echo for 6 months. Will send staff message to DB's nurse.

## 2015-12-27 NOTE — Consult Note (Signed)
   [facility]   12/27/2015  Dawayne CirriViolet W Krebbs 05/01/1919 [id number] Patient evaluated for community based chronic disease management services [facility] as a benefit of patient's Medicare Insurance. Spoke with patient at bedside to explain [facility], patient was being wheeled out for discharge. Patient verbalized she would like services to begin. Verbal consent and a brochure was given. Patient to have home health care to start as well and family to arrange 24 hour care.  Patient will receive post hospital discharge call and will be evaluated for monthly home visits for assessments and disease process education.  Left contact information and [facility] literature at bedside. Of note, [facility] services does not replace or interfere with any services that are arranged by inpatient case management or social work.  For additional questions or referrals please contact:   Charlesetta ShanksVictoria Broadus Costilla, RN BSN CCM [facility] Liaison  [phone] business mobile phone Toll free office [phone]

## 2015-12-27 NOTE — Progress Notes (Signed)
Physical Therapy Treatment Patient Details Name: Erin Gibson MRN: [id number] DOB: 05/09/1919 Today's Date: 12/27/2015    History of Present Illness Pt is a 82 y.o. female who fell at home due to a believed syncopal event, felt to be vasovagal. PMH:  anemia, TKA X2, hypertension.      PT Comments    Pt seen to review HEP with patient and caregiver. Primary caregiver Lamar Laundry(Sonya) present during session and observing exercises. All exercises performed and pt and caregiver who both deny questions or concerns. Handout provided for reference.   Follow Up Recommendations  Home health PT;Supervision/Assistance - 24 hour     Equipment Recommendations  None recommended by PT    Recommendations for Other Services       Precautions / Restrictions Precautions Precautions: Fall Restrictions Weight Bearing Restrictions: No    Mobility  Bed Mobility               General bed mobility comments: pt up in chair upon arrival       Balance Overall balance assessment: Needs assistance Sitting-balance support: No upper extremity supported Sitting balance-Leahy Scale: Good     Standing balance support: Bilateral upper extremity supported Standing balance-Leahy Scale: Poor Standing balance comment: requiring rw and physical assist with initial stand                    Cognition Arousal/Alertness: Awake/alert Behavior During Therapy: WFL for tasks assessed/performed Overall Cognitive Status: Within Functional Limits for tasks assessed                      Exercises Other Exercises Other Exercises: head static with eye movements Lt/Rt, up/down.  Other Exercises: head turns bilaterally with focused gaze Other Exercises: head static with moving object Lt/Rt Other Exercises: object stationary with head moving.    General Comments General comments (skin integrity, edema, etc.): Vestibular activities revealing reproduction of dizziness with head rotation bilaterally and  present but less intense with cervical flexion/extension. No noted nystagmus noted with visual pursuit. Pt denies any sensation of room spinning at rest or with sit/stand. Dizziness reproduced primarily with head rotation.  Time spent on education on having assistance with transfers and all mobility at home. Pt verbalized understanding.       Pertinent Vitals/Pain Pain Assessment: No/denies pain           PT Goals (current goals can now be found in the care plan section) Acute Rehab PT Goals Patient Stated Goal: go home PT Goal Formulation: With patient Time For Goal Achievement: 01/10/16 Potential to Achieve Goals: Good Progress towards PT goals: Progressing toward goals    Frequency  Min 3X/week    PT Plan Current plan remains appropriate    Co-evaluation             End of Session Equipment Utilized During Treatment: Gait belt Activity Tolerance: Patient tolerated treatment well;Patient limited by fatigue Patient left: in chair;with call bell/phone within reach;with chair alarm set;with nursing/sitter in room     Time: 1217-1232 PT Time Calculation (min) (ACUTE ONLY): 15 min  Charges:  $Therapeutic Exercise: 8-22 mins                    Christiane HaBenjamin J. Rex Oesterle, PT, CSCS Pager [phone] Office [phone]  12/27/2015, 12:57 PM

## 2015-12-28 ENCOUNTER — Other Ambulatory Visit: Payer: Self-pay | Admitting: *Deleted

## 2015-12-28 ENCOUNTER — Other Ambulatory Visit: Payer: Self-pay

## 2015-12-28 NOTE — Telephone Encounter (Signed)
This pt does not need to be seen in the [facility], Dr Gala RomneyBensimhon only saw her in the hospital over hte weekend because he was the MD on call, she will need to establish with a gen card for f/u, thanks

## 2015-12-28 NOTE — Patient Outreach (Signed)
[facility] Care Management  12/28/2015  Erin Gibson 01/03/1919 [id number]   Transition of care (week 1)  Pt contacted pt today and introduced the [facility] and this RN's purpose for calling her today. Pt discharged on yesterday as RN outreached to pt today. Caregiver Sonya present as RN able to obtain verbral consent from pt to speak with Ms. Sonya if needed however pt able to speak with this RN today.  Pt verifies her understanding of this last admission related to a falls with 10 stitches to her head. Pt states she has had several falls over the past year some with and some without injuries. Pt states her daughter has arranged for pt to have 24 hrs care in the home. Pt states she has a caregiver during the day and night arranged by her daughter Erin Gibson(Gale Imler). RN able to completed the transition of care template and obtained initial information with pt responding well to the inquires. Pt indicated her appointment was on 4/18. RN inquired if RN can follow up week over the next few weeks to inquired on any potential needs or concerns related to her recent discharge ( pt receptive). Note RN also extended an offer for community home visits to further address her needs however due to the "around the clock" services pt feels is in doing well.  Therefore RN discussed goals and plan of care that pt's has accepted to completed with medical appointments, adherence to all medications and precautionary measures to prevent possible readmission. Will scheduled next telephonic for next week and continue the [facility].  Elliot CousinLisa Temitayo Covalt, RN Care Management Coordinator [facility] Main Office [phone]   Elliot CousinLisa Sham Alviar, RN Care Management Coordinator [facility] Main Office [phone]

## 2015-12-29 DIAGNOSIS — S0101XA Laceration without foreign body of scalp, initial encounter: Secondary | ICD-10-CM | POA: Diagnosis not present

## 2015-12-29 DIAGNOSIS — I951 Orthostatic hypotension: Secondary | ICD-10-CM | POA: Diagnosis not present

## 2015-12-29 DIAGNOSIS — Z86718 Personal history of other venous thrombosis and embolism: Secondary | ICD-10-CM | POA: Diagnosis not present

## 2015-12-29 DIAGNOSIS — Z9181 History of falling: Secondary | ICD-10-CM | POA: Diagnosis not present

## 2015-12-29 DIAGNOSIS — I129 Hypertensive chronic kidney disease with stage 1 through stage 4 chronic kidney disease, or unspecified chronic kidney disease: Secondary | ICD-10-CM | POA: Diagnosis not present

## 2015-12-29 DIAGNOSIS — R55 Syncope and collapse: Secondary | ICD-10-CM | POA: Diagnosis not present

## 2015-12-29 DIAGNOSIS — I872 Venous insufficiency (chronic) (peripheral): Secondary | ICD-10-CM | POA: Diagnosis not present

## 2015-12-29 DIAGNOSIS — Z87440 Personal history of urinary (tract) infections: Secondary | ICD-10-CM | POA: Diagnosis not present

## 2015-12-29 DIAGNOSIS — R1319 Other dysphagia: Secondary | ICD-10-CM | POA: Diagnosis not present

## 2015-12-29 DIAGNOSIS — N183 Chronic kidney disease, stage 3 (moderate): Secondary | ICD-10-CM | POA: Diagnosis not present

## 2015-12-29 DIAGNOSIS — D649 Anemia, unspecified: Secondary | ICD-10-CM | POA: Diagnosis not present

## 2015-12-29 DIAGNOSIS — Z86711 Personal history of pulmonary embolism: Secondary | ICD-10-CM | POA: Diagnosis not present

## 2015-12-31 ENCOUNTER — Observation Stay (HOSPITAL_COMMUNITY)
Admission: EM | Admit: 2015-12-31 | Discharge: 2016-01-02 | Disposition: A | Discharge status: Home / Self Care | Payer: Medicare Other | Attending: Family Medicine | Admitting: Family Medicine

## 2015-12-31 ENCOUNTER — Encounter (HOSPITAL_COMMUNITY): Payer: Self-pay | Admitting: Emergency Medicine

## 2015-12-31 DIAGNOSIS — S0101XA Laceration without foreign body of scalp, initial encounter: Secondary | ICD-10-CM | POA: Diagnosis present

## 2015-12-31 DIAGNOSIS — R079 Chest pain, unspecified: Secondary | ICD-10-CM | POA: Diagnosis not present

## 2015-12-31 DIAGNOSIS — Z96659 Presence of unspecified artificial knee joint: Secondary | ICD-10-CM | POA: Insufficient documentation

## 2015-12-31 DIAGNOSIS — Z79899 Other long term (current) drug therapy: Secondary | ICD-10-CM | POA: Insufficient documentation

## 2015-12-31 DIAGNOSIS — I129 Hypertensive chronic kidney disease with stage 1 through stage 4 chronic kidney disease, or unspecified chronic kidney disease: Secondary | ICD-10-CM | POA: Diagnosis not present

## 2015-12-31 DIAGNOSIS — Z7982 Long term (current) use of aspirin: Secondary | ICD-10-CM | POA: Insufficient documentation

## 2015-12-31 DIAGNOSIS — N183 Chronic kidney disease, stage 3 unspecified: Secondary | ICD-10-CM | POA: Diagnosis present

## 2015-12-31 DIAGNOSIS — E875 Hyperkalemia: Secondary | ICD-10-CM | POA: Diagnosis not present

## 2015-12-31 DIAGNOSIS — Z66 Do not resuscitate: Secondary | ICD-10-CM | POA: Insufficient documentation

## 2015-12-31 DIAGNOSIS — R0602 Shortness of breath: Secondary | ICD-10-CM | POA: Diagnosis not present

## 2015-12-31 DIAGNOSIS — K219 Gastro-esophageal reflux disease without esophagitis: Secondary | ICD-10-CM | POA: Diagnosis not present

## 2015-12-31 DIAGNOSIS — R0789 Other chest pain: Secondary | ICD-10-CM | POA: Diagnosis not present

## 2015-12-31 DIAGNOSIS — Z86718 Personal history of other venous thrombosis and embolism: Secondary | ICD-10-CM | POA: Insufficient documentation

## 2015-12-31 DIAGNOSIS — I10 Essential (primary) hypertension: Secondary | ICD-10-CM | POA: Diagnosis present

## 2015-12-31 DIAGNOSIS — R52 Pain, unspecified: Secondary | ICD-10-CM

## 2015-12-31 NOTE — ED Provider Notes (Signed)
CSN: [id number]     Arrival date & time 12/31/15  2324 History   First MD Initiated Contact with Patient 12/31/15 2326     Chief Complaint  Patient presents with  . Chest Pain    HPI   Erin Gibson is a 82 y.o. female with a PMH of anemia, GERD, HTN, CKD, DVT who presents to the ED with chest pain, which she states started 2-3 hours prior to arrival and has been constant since that time. She notes radiation to her back and left axilla. She denies exacerbating factors. She has not tried anything for symptom relief. She denies fever, chills, abdominal pain, N/V, numbness, paresthesia. She notes generalized weakness. She also reports shortness of breath, which she states she has at baseline and is unchanged. She states she was given ASA by EMS.    Past Medical History  Diagnosis Date  . ANEMIA 03/31/2009  . DEGENERATIVE JOINT DISEASE, GENERALIZED 05/06/2007  . GERD 04/24/2007  . HYPERTENSION 04/24/2007  . VENOUS STASIS ULCER 07/20/2008  . DVT (deep venous thrombosis) (HCC)     hx of  . Chronic kidney disease   . Diverticulosis of sigmoid colon     Colonoscopy 2011   Past Surgical History  Procedure Laterality Date  . Cataract extraction    . Abdominal hysterectomy      Completion hysterectomy  . Total knee arthroplasty      x2  . Hernia repair    . Cholecystectomy open    . Partial hysterectomy      Salpingo-oophorectomy as well   History reviewed. No pertinent family history. Social History  Substance Use Topics  . Smoking status: Never Smoker   . Smokeless tobacco: Never Used  . Alcohol Use: No   OB History    No data available      Review of Systems  Constitutional: Negative for fever and chills.  Respiratory: Positive for shortness of breath.   Cardiovascular: Positive for chest pain.  Gastrointestinal: Negative for nausea, vomiting and abdominal pain.  Neurological: Negative for syncope, weakness and numbness.  All other systems reviewed and are  negative.     Allergies  Codeine; Diltiazem hcl; Ibuprofen; and Tramadol  Home Medications   Prior to Admission medications   Medication Sig Start Date End Date Taking? Authorizing Provider  Artificial Tear GEL Place 2 drops into both eyes 2 (two) times daily.   Yes Historical Provider, MD  aspirin EC 81 MG tablet Take 81 mg by mouth every morning.   Yes Historical Provider, MD  Calcium Carbonate-Vitamin D (CALCIUM 600 + D PO) Take 1 tablet by mouth every morning.   Yes Historical Provider, MD  Cyanocobalamin (VITAMIN B-12) 2500 MCG SUBL Place 1 tablet under the tongue every morning.   Yes Historical Provider, MD  furosemide (LASIX) 40 MG tablet TAKE 1/2 TABLET (=20MG )    DAILY 10/06/15  Yes Gordy SaversPeter F Kwiatkowski, MD  hydrALAZINE (APRESOLINE) 25 MG tablet Take 0.5 tablets (12.5 mg total) by mouth every 8 (eight) hours. 12/26/15  Yes Rhetta MuraJai-Gurmukh Samtani, MD  Multiple Vitamins-Minerals (PRESERVISION AREDS 2) CAPS Take 1 capsule by mouth daily. 10/06/15  Yes Gordy SaversPeter F Kwiatkowski, MD  nitroGLYCERIN (NITROSTAT) 0.4 MG SL tablet Place 0.4 mg under the tongue every 5 (five) minutes as needed for chest pain.   Yes Historical Provider, MD  omeprazole (PRILOSEC) 40 MG capsule Take 1 capsule (40 mg total) by mouth 2 (two) times daily. Patient taking differently: Take 40 mg by mouth every  morning.  10/06/15  Yes Gordy Savers, MD  polyethylene glycol powder (GLYCOLAX/MIRALAX) powder MIX 17GM IN LIQUID AND     DRINK TWO TIMES A DAY 10/06/15  Yes Gordy Savers, MD  potassium chloride SA (K-DUR,KLOR-CON) 20 MEQ tablet Take 1 tablet (20 mEq total) by mouth daily. 10/06/15  Yes Gordy Savers, MD  quinapril (ACCUPRIL) 40 MG tablet Take 0.5 tablets (20 mg total) by mouth daily. 12/26/15  Yes Rhetta Mura, MD  vitamin E (VITAMIN E) 400 UNIT capsule Take 400 Units by mouth every morning.    Yes Historical Provider, MD    BP 143/74 mmHg  Pulse 67  Temp(Src) 98.7 F (37.1 C) (Oral)  Resp 16   SpO2 95% Physical Exam  Constitutional: She is oriented to person, place, and time. She appears well-developed and well-nourished. No distress.  HENT:  Head: Normocephalic and atraumatic.  Right Ear: External ear normal.  Left Ear: External ear normal.  Nose: Nose normal.  Mouth/Throat: Uvula is midline, oropharynx is clear and moist and mucous membranes are normal.  Eyes: Conjunctivae, EOM and lids are normal. Pupils are equal, round, and reactive to light. Right eye exhibits no discharge. Left eye exhibits no discharge. No scleral icterus.  Neck: Normal range of motion. Neck supple.  Cardiovascular: Normal rate, regular rhythm, normal heart sounds, intact distal pulses and normal pulses.   3/6 systolic ejection murmur throughout precordium.  Pulmonary/Chest: Effort normal and breath sounds normal. No respiratory distress. She has no wheezes. She has no rales. She exhibits no tenderness.  Abdominal: Soft. Normal appearance and bowel sounds are normal. She exhibits no distension and no mass. There is no tenderness. There is no rigidity, no rebound and no guarding.  Musculoskeletal: Normal range of motion. She exhibits no edema or tenderness.  No TTP to midline cervical, thoracic, or lumbar spine. Mild TTP to left thoracic paraspinal muscles.  Neurological: She is alert and oriented to person, place, and time. She has normal strength. No cranial nerve deficit or sensory deficit.  Skin: Skin is warm, dry and intact. No rash noted. She is not diaphoretic. No erythema. No pallor.  Psychiatric: She has a normal mood and affect. Her speech is normal and behavior is normal.  Nursing note and vitals reviewed.   ED Course  Procedures (including critical care time)  Labs Review Labs Reviewed  COMPREHENSIVE METABOLIC PANEL - Abnormal; Notable for the following:    Potassium 5.2 (*)    Glucose, Bld 107 (*)    BUN 34 (*)    Creatinine, Ser 1.55 (*)    Total Protein 6.4 (*)    Albumin 3.0 (*)     ALT 11 (*)    GFR calc non Af Amer 27 (*)    GFR calc Af Amer 32 (*)    All other components within normal limits  CBC WITH DIFFERENTIAL/PLATELET - Abnormal; Notable for the following:    RBC 3.56 (*)    Hemoglobin 10.3 (*)    HCT 32.7 (*)    All other components within normal limits  TROPONIN I  TROPONIN I  TROPONIN I  TROPONIN I  I-STAT TROPOININ, ED    Imaging Review Dg Chest Port 1 View  01/01/2016  CLINICAL DATA:  Back pain radiating into the chest, predominantly left-sided. EXAM: PORTABLE CHEST 1 VIEW COMPARISON:  12/25/2015 FINDINGS: There is unchanged left hemidiaphragm elevation. Unchanged mild curvilinear scarring or atelectasis above the elevated left hemidiaphragm. No alveolar consolidation. No large effusions. No pneumothorax.  Hilar and mediastinal contours are unremarkable and unchanged. Marked thoracic scoliosis. IMPRESSION: No acute cardiopulmonary findings. Electronically Signed   By: Ellery Plunk M.D.   On: 01/01/2016 00:57   I have personally reviewed and evaluated these images and lab results as part of my medical decision-making.   EKG Interpretation   Date/Time:  Saturday December 31 2015 23:30:23 EDT Ventricular Rate:  87 PR Interval:  206 QRS Duration: 92 QT Interval:  411 QTC Calculation: 494 R Axis:   -26 Text Interpretation:  Atrial-paced complexes Probable left atrial  enlargement Borderline left axis deviation No significant change since  last tracing Confirmed by POLLINA  MD, CHRISTOPHER ([id number]) on 01/01/2016  12:06:58 AM      MDM   Final diagnoses:  Chest pain    82 year old female presents with chest pain, which started several hours prior to arrival. Notes shortness of breath, though states this is unchanged from baseline. Per record review, patient recently admitted for a fall. She was seen by cardiology at that time, and had an echo. She was not complaining of chest pain, and an ischemic work-up was not initiated.  Patient is afebrile.  Vital signs stable. Heart RRR. Lungs clear to auscultation bilaterally. Abdomen soft, non-tender, non-distended. Strength and sensation intact. Mild TTP to left thoracic paraspinal muscles. No midline cervical, thoracic, or lumbar TTP. No palpable step-off or deformity.  EKG atrial-paced, no significant change since last tracing. Troponin negative. CBC negative for leukocytosis, hemoglobin 10.3. CMP remarkable for potassium 5.2, creatinine 1.55. CXR negative for acute cardiopulmonary findings.  Patient discussed with Dr. Blinda Leatherwood. Hospitalist consulted for admission. Spoke with Dr. Julian Reil. Patient to be admitted for further evaluation and management of chest pain.  BP 143/74 mmHg  Pulse 67  Temp(Src) 98.7 F (37.1 C) (Oral)  Resp 16  SpO2 95%     Mady Gemma, PA-C 01/01/16 0234  Gilda Crease, MD 01/01/16 [phone]

## 2015-12-31 NOTE — ED Notes (Signed)
Pt states she was sitting in her chair earlier this evening, when she starting have back pain. The back pain eventually radiated to her chest. Caregiver called [phone]. Pt describes her pain as a stabbing pain on her left side. Pt took 324 mg of ASA on scene, and two nitro tablets enroute to the hospital.

## 2016-01-01 ENCOUNTER — Emergency Department (HOSPITAL_COMMUNITY): Payer: Medicare Other

## 2016-01-01 ENCOUNTER — Observation Stay (HOSPITAL_COMMUNITY): Payer: Medicare Other

## 2016-01-01 DIAGNOSIS — I10 Essential (primary) hypertension: Secondary | ICD-10-CM | POA: Diagnosis not present

## 2016-01-01 DIAGNOSIS — R079 Chest pain, unspecified: Secondary | ICD-10-CM | POA: Diagnosis not present

## 2016-01-01 DIAGNOSIS — R0781 Pleurodynia: Secondary | ICD-10-CM | POA: Diagnosis not present

## 2016-01-01 DIAGNOSIS — I209 Angina pectoris, unspecified: Secondary | ICD-10-CM

## 2016-01-01 LAB — COMPREHENSIVE METABOLIC PANEL
ALBUMIN: 3 g/dL — AB (ref 3.5–5.0)
ALT: 11 U/L — AB (ref 14–54)
AST: 17 U/L (ref 15–41)
Alkaline Phosphatase: 101 U/L (ref 38–126)
Anion gap: 11 (ref 5–15)
BUN: 34 mg/dL — AB (ref 6–20)
CO2: 23 mmol/L (ref 22–32)
CREATININE: 1.55 mg/dL — AB (ref 0.44–1.00)
Calcium: 9.1 mg/dL (ref 8.9–10.3)
Chloride: 105 mmol/L (ref 101–111)
GFR calc Af Amer: 32 mL/min — ABNORMAL LOW (ref >60)
GFR, EST NON AFRICAN AMERICAN: 27 mL/min — AB (ref >60)
GLUCOSE: 107 mg/dL — AB (ref 65–99)
Potassium: 5.2 mmol/L — ABNORMAL HIGH (ref 3.5–5.1)
Sodium: 139 mmol/L (ref 135–145)
Total Bilirubin: 0.7 mg/dL (ref 0.3–1.2)
Total Protein: 6.4 g/dL — ABNORMAL LOW (ref 6.5–8.1)

## 2016-01-01 LAB — CBC WITH DIFFERENTIAL/PLATELET
Basophils Absolute: 0 10*3/uL (ref 0.0–0.1)
Basophils Relative: 1 %
Eosinophils Absolute: 0.2 10*3/uL (ref 0.0–0.7)
Eosinophils Relative: 3 %
HEMATOCRIT: 32.7 % — AB (ref 36.0–46.0)
HEMOGLOBIN: 10.3 g/dL — AB (ref 12.0–15.0)
LYMPHS PCT: 15 %
Lymphs Abs: 1.1 10*3/uL (ref 0.7–4.0)
MCH: 28.9 pg (ref 26.0–34.0)
MCHC: 31.5 g/dL (ref 30.0–36.0)
MCV: 91.9 fL (ref 78.0–100.0)
MONO ABS: 0.8 10*3/uL (ref 0.1–1.0)
MONOS PCT: 12 %
NEUTROS PCT: 69 %
Neutro Abs: 4.9 10*3/uL (ref 1.7–7.7)
Platelets: 202 10*3/uL (ref 150–400)
RBC: 3.56 MIL/uL — ABNORMAL LOW (ref 3.87–5.11)
RDW: 13.6 % (ref 11.5–15.5)
WBC: 7.1 10*3/uL (ref 4.0–10.5)

## 2016-01-01 LAB — I-STAT TROPONIN, ED: TROPONIN I, POC: 0.01 ng/mL (ref 0.00–0.08)

## 2016-01-01 LAB — TROPONIN I
Troponin I: <0.03 ng/mL (ref <0.031)
Troponin I: <0.03 ng/mL (ref <0.031)
Troponin I: <0.03 ng/mL (ref <0.031)

## 2016-01-01 MED ORDER — PANTOPRAZOLE SODIUM 40 MG PO TBEC
80.0000 mg | DELAYED_RELEASE_TABLET | Freq: Every day | ORAL | Status: DC
  Administered 2016-01-01 – 2016-01-02 (×2): 80 mg via ORAL
  Filled 2016-01-01 (×2): qty 2

## 2016-01-01 MED ORDER — FENTANYL CITRATE (PF) 100 MCG/2ML IJ SOLN
12.5000 ug | INTRAMUSCULAR | Status: DC | PRN
  Administered 2016-01-01: 25 ug via INTRAVENOUS
  Administered 2016-01-01: 12.5 ug via INTRAVENOUS
  Administered 2016-01-01: 25 ug via INTRAVENOUS
  Administered 2016-01-02 (×2): 12.5 ug via INTRAVENOUS
  Filled 2016-01-01 (×5): qty 2

## 2016-01-01 MED ORDER — PROSIGHT PO TABS
1.0000 | ORAL_TABLET | Freq: Every day | ORAL | Status: DC
  Administered 2016-01-02: 1 via ORAL
  Filled 2016-01-01 (×2): qty 1

## 2016-01-01 MED ORDER — POLYVINYL ALCOHOL 1.4 % OP SOLN
2.0000 [drp] | Freq: Two times a day (BID) | OPHTHALMIC | Status: DC
  Administered 2016-01-01 – 2016-01-02 (×4): 2 [drp] via OPHTHALMIC
  Filled 2016-01-01 (×2): qty 15

## 2016-01-01 MED ORDER — DEXTROSE-NACL 5-0.45 % IV SOLN
INTRAVENOUS | Status: DC
  Administered 2016-01-01: 05:00:00 via INTRAVENOUS
  Administered 2016-01-01 – 2016-01-02 (×2): 75 mL/h via INTRAVENOUS

## 2016-01-01 MED ORDER — VITAMIN B-12 1000 MCG PO TABS
2500.0000 ug | ORAL_TABLET | Freq: Every morning | ORAL | Status: DC
Start: 2016-01-01 — End: 2016-01-02
  Administered 2016-01-01 – 2016-01-02 (×2): 2500 ug via ORAL
  Filled 2016-01-01 (×2): qty 3

## 2016-01-01 MED ORDER — LISINOPRIL 20 MG PO TABS
20.0000 mg | ORAL_TABLET | Freq: Every day | ORAL | Status: DC
  Administered 2016-01-01 – 2016-01-02 (×2): 20 mg via ORAL
  Filled 2016-01-01 (×2): qty 1
  Filled 2016-01-01 (×2): qty 2

## 2016-01-01 MED ORDER — HYDRALAZINE HCL 25 MG PO TABS
12.5000 mg | ORAL_TABLET | Freq: Three times a day (TID) | ORAL | Status: DC
  Administered 2016-01-01 – 2016-01-02 (×4): 12.5 mg via ORAL
  Filled 2016-01-01 (×3): qty 1

## 2016-01-01 MED ORDER — FENTANYL CITRATE (PF) 100 MCG/2ML IJ SOLN
12.5000 ug | Freq: Once | INTRAMUSCULAR | Status: AC
  Administered 2016-01-01: 12.5 ug via INTRAVENOUS
  Filled 2016-01-01: qty 2

## 2016-01-01 MED ORDER — ACETAMINOPHEN 325 MG PO TABS
650.0000 mg | ORAL_TABLET | ORAL | Status: DC | PRN
  Administered 2016-01-01: 650 mg via ORAL
  Filled 2016-01-01 (×2): qty 2

## 2016-01-01 MED ORDER — ONDANSETRON HCL 4 MG/2ML IJ SOLN
4.0000 mg | Freq: Four times a day (QID) | INTRAMUSCULAR | Status: DC | PRN
  Administered 2016-01-01: 4 mg via INTRAVENOUS
  Filled 2016-01-01: qty 2

## 2016-01-01 MED ORDER — ENSURE ENLIVE PO LIQD
237.0000 mL | Freq: Two times a day (BID) | ORAL | Status: DC
  Administered 2016-01-02: 237 mL via ORAL

## 2016-01-01 MED ORDER — NITROGLYCERIN 0.4 MG SL SUBL: 0.4000 mg | SUBLINGUAL_TABLET | SUBLINGUAL | Status: DC | PRN

## 2016-01-01 MED ORDER — ENOXAPARIN SODIUM 30 MG/0.3ML ~~LOC~~ SOLN
25.0000 mg | SUBCUTANEOUS | Status: DC
  Administered 2016-01-01 – 2016-01-02 (×2): 25 mg via SUBCUTANEOUS
  Filled 2016-01-01 (×3): qty 0.25
  Filled 2016-01-01: qty 0.3
  Filled 2016-01-01: qty 0.25

## 2016-01-01 MED ORDER — ASPIRIN EC 81 MG PO TBEC
81.0000 mg | DELAYED_RELEASE_TABLET | Freq: Every morning | ORAL | Status: DC
  Administered 2016-01-01 – 2016-01-02 (×2): 81 mg via ORAL
  Filled 2016-01-01 (×2): qty 1

## 2016-01-01 MED ORDER — HYDROMORPHONE HCL 1 MG/ML IJ SOLN
1.0000 mg | INTRAMUSCULAR | Status: DC | PRN
  Administered 2016-01-01 – 2016-01-02 (×2): 1 mg via INTRAVENOUS
  Filled 2016-01-01 (×2): qty 1

## 2016-01-01 NOTE — ED Notes (Signed)
Pt is chest pain free at this time 

## 2016-01-01 NOTE — Progress Notes (Signed)
Pt seen and examined at bedside. Daughter at bedside as well. Reports right sided chest discomfort and on physical exam, pain is reproducible, worse with palpation, rib area affected. Will ask for dedicated rib xray to rule out fracture. No clear evidence of ischemia. CE x 3 negative, VSS. Hold off on cardiology consult for now. Provide analgesia as needed.   Debbora PrestoMAGICK-Yaneliz Radebaugh, MD  [facility] Pager [phone]  If 7PM-7AM, please contact night-coverage [email or web address] Password [email or web address]

## 2016-01-01 NOTE — H&P (Signed)
[facility] History and Physical  Erin Gibson ZOX:[id number] DOB: [birth year] [name]: 12/31/2015  Referring physician: EDP PCP: Rogelia Boga, MD   Chief Complaint: Chest pain   HPI: Erin Gibson is a 82 y.o. female who presents to the ED with c/o chest pain.  Patient was just discharged from our service a couple of days ago, after a work up for dizziness.  During that admit she had slightly bumped troponins up to 0.29, echo was performed; however, due to lack of cardiac symptoms, cardiology did not feel that further ischemic work up was required.  Unfortunately, this evening patient developed onset of left sided back pain which radiates through to the front of her chest.  Symptoms have been constant since that time.  NTG provides no relief, does have some relief with fentanyl.  Review of Systems: Systems reviewed.  As above, otherwise negative  Past Medical History  Diagnosis Date  . ANEMIA 03/31/2009  . DEGENERATIVE JOINT DISEASE, GENERALIZED 05/06/2007  . GERD 04/24/2007  . HYPERTENSION 04/24/2007  . VENOUS STASIS ULCER 07/20/2008  . DVT (deep venous thrombosis) (HCC)     hx of  . Chronic kidney disease   . Diverticulosis of sigmoid colon     Colonoscopy 2011   Past Surgical History  Procedure Laterality Date  . Cataract extraction    . Abdominal hysterectomy      Completion hysterectomy  . Total knee arthroplasty      x2  . Hernia repair    . Cholecystectomy open    . Partial hysterectomy      Salpingo-oophorectomy as well   Social History:  reports that she has never smoked. She has never used smokeless tobacco. She reports that she does not drink alcohol or use illicit drugs.  Allergies  Allergen Reactions  . Codeine Nausea And Vomiting    Violently ill  . Diltiazem Hcl Other (See Comments)    Not sure about this reaction  . Ibuprofen Nausea And Vomiting  . Tramadol     Auditory and visual hallucinations    History reviewed. No pertinent family  history.   Prior to Admission medications   Medication Sig Start Date End Date Taking? Authorizing Provider  Artificial Tear GEL Place 2 drops into both eyes 2 (two) times daily.   Yes Historical Provider, MD  aspirin EC 81 MG tablet Take 81 mg by mouth every morning.   Yes Historical Provider, MD  Calcium Carbonate-Vitamin D (CALCIUM 600 + D PO) Take 1 tablet by mouth every morning.   Yes Historical Provider, MD  Cyanocobalamin (VITAMIN B-12) 2500 MCG SUBL Place 1 tablet under the tongue every morning.   Yes Historical Provider, MD  furosemide (LASIX) 40 MG tablet TAKE 1/2 TABLET (=20MG )    DAILY 10/06/15  Yes Gordy Savers, MD  hydrALAZINE (APRESOLINE) 25 MG tablet Take 0.5 tablets (12.5 mg total) by mouth every 8 (eight) hours. 12/26/15  Yes Rhetta Mura, MD  Multiple Vitamins-Minerals (PRESERVISION AREDS 2) CAPS Take 1 capsule by mouth daily. 10/06/15  Yes Gordy Savers, MD  nitroGLYCERIN (NITROSTAT) 0.4 MG SL tablet Place 0.4 mg under the tongue every 5 (five) minutes as needed for chest pain.   Yes Historical Provider, MD  omeprazole (PRILOSEC) 40 MG capsule Take 1 capsule (40 mg total) by mouth 2 (two) times daily. Patient taking differently: Take 40 mg by mouth every morning.  10/06/15  Yes Gordy Savers, MD  polyethylene glycol powder (GLYCOLAX/MIRALAX) powder MIX 17GM IN LIQUID  AND     DRINK TWO TIMES A DAY 10/06/15  Yes Gordy SaversPeter F Kwiatkowski, MD  potassium chloride SA (K-DUR,KLOR-CON) 20 MEQ tablet Take 1 tablet (20 mEq total) by mouth daily. 10/06/15  Yes Gordy SaversPeter F Kwiatkowski, MD  quinapril (ACCUPRIL) 40 MG tablet Take 0.5 tablets (20 mg total) by mouth daily. 12/26/15  Yes Rhetta MuraJai-Gurmukh Samtani, MD  vitamin E (VITAMIN E) 400 UNIT capsule Take 400 Units by mouth every morning.    Yes Historical Provider, MD   Physical Exam: Filed Vitals:   01/01/16 0130 01/01/16 0200  BP: 130/58 143/74  Pulse: 68 67  Temp:    Resp: 11 16    BP 143/74 mmHg  Pulse 67  Temp(Src) 98.7  F (37.1 C) (Oral)  Resp 16  SpO2 95%  General Appearance:    Alert, oriented, no distress, appears stated age  Head:    Normocephalic, atraumatic  Eyes:    PERRL, EOMI, sclera non-icteric        Nose:   Nares without drainage or epistaxis. Mucosa, turbinates normal  Throat:   Moist mucous membranes. Oropharynx without erythema or exudate.  Neck:   Supple. No carotid bruits.  No thyromegaly.  No lymphadenopathy.   Back:     No CVA tenderness, no spinal tenderness  Lungs:     Clear to auscultation bilaterally, without wheezes, rhonchi or rales  Chest wall:    No tenderness to palpitation  Heart:    Regular rate and rhythm without murmurs, gallops, rubs  Abdomen:     Soft, non-tender, nondistended, normal bowel sounds, no organomegaly  Genitalia:    deferred  Rectal:    deferred  Extremities:   No clubbing, cyanosis or edema.  Pulses:   2+ and symmetric all extremities  Skin:   Skin color, texture, turgor normal, no rashes or lesions  Lymph nodes:   Cervical, supraclavicular, and axillary nodes normal  Neurologic:   CNII-XII intact. Normal strength, sensation and reflexes      throughout    Labs on Admission:  Basic Metabolic Panel:  Recent Labs Lab 12/25/15 0638 12/26/15 0942 01/01/16 0042  NA 141 140 139  K 5.6* 4.6 5.2*  CL 115* 109 105  CO2 20* 23 23  GLUCOSE 85 106* 107*  BUN 31* 23* 34*  CREATININE 1.29* 1.37* 1.55*  CALCIUM 8.7* 9.3 9.1   Liver Function Tests:  Recent Labs Lab 01/01/16 0042  AST 17  ALT 11*  ALKPHOS 101  BILITOT 0.7  PROT 6.4*  ALBUMIN 3.0*   No results for input(s): LIPASE, AMYLASE in the last 168 hours. No results for input(s): AMMONIA in the last 168 hours. CBC:  Recent Labs Lab 01/01/16 0042  WBC 7.1  NEUTROABS 4.9  HGB 10.3*  HCT 32.7*  MCV 91.9  PLT 202   Cardiac Enzymes:  Recent Labs Lab 12/25/15 0638 12/25/15 1035 12/25/15 1507 12/25/15 2010  TROPONINI 0.29* 0.23* 0.20* 0.15*    BNP (last 3 results) No  results for input(s): PROBNP in the last 8760 hours. CBG:  Recent Labs Lab 12/25/15 1106  GLUCAP 90    Radiological Exams on Admission: Dg Chest Port 1 View  01/01/2016  CLINICAL DATA:  Back pain radiating into the chest, predominantly left-sided. EXAM: PORTABLE CHEST 1 VIEW COMPARISON:  12/25/2015 FINDINGS: There is unchanged left hemidiaphragm elevation. Unchanged mild curvilinear scarring or atelectasis above the elevated left hemidiaphragm. No alveolar consolidation. No large effusions. No pneumothorax. Hilar and mediastinal contours are unremarkable and unchanged. Marked  thoracic scoliosis. IMPRESSION: No acute cardiopulmonary findings. Electronically Signed   By: Ellery Plunk M.D.   On: 01/01/2016 00:57    EKG: Independently reviewed.  Assessment/Plan Active Problems:   Chest pain   1. Chest pain - 1. Chest pain obs pathway 2. Now having chest pain, although troponins are now negative unlike a couple of days ago. 3. Likely needs cards consult in AM. 4. Although she and family are not sure they would want a heart cath given her age. 5. NPO in case they want a stress test 6. Will put her on D5 half NS to prevent blood sugar dropping which she has a history of 2. HTN - continue home meds, except holding lasix.    Code Status: DNR per family  Family Communication: Daughter at bed side, patient indicates daughter is "Management consultant" Disposition Plan: Admit to obs   Time spent: 50 min  GARDNER, JARED M. [facility] Pager [phone]  If 7AM-7PM, please contact the day team taking care of the patient [email or web address] Password [email or web address] 01/01/2016, 2:27 AM

## 2016-01-02 ENCOUNTER — Other Ambulatory Visit: Payer: Self-pay | Admitting: *Deleted

## 2016-01-02 DIAGNOSIS — I10 Essential (primary) hypertension: Secondary | ICD-10-CM

## 2016-01-02 DIAGNOSIS — R079 Chest pain, unspecified: Principal | ICD-10-CM

## 2016-01-02 MED ORDER — ENSURE ENLIVE PO LIQD: 237.0000 mL | Freq: Two times a day (BID) | ORAL | Status: DC

## 2016-01-02 MED ORDER — ACETAMINOPHEN 325 MG PO TABS: 650.0000 mg | ORAL_TABLET | ORAL | Status: AC | PRN

## 2016-01-02 NOTE — Care Management Obs Status (Signed)
MEDICARE OBSERVATION STATUS NOTIFICATION   Patient Details  Name: Erin Gibson MRN: [id number] Date of Birth: 06/07/1919   Medicare Observation Status Notification Given:  Yes    Darrold SpanWebster, Erin Yellin Hall, RN 01/02/2016, 2:46 PM

## 2016-01-02 NOTE — Patient Outreach (Signed)
[facility] Care Management  01/02/2016  [name] 11/02/1918 [id number]  RN attempted to contact pt however pt readmitted on 4/8 for chest pain. Will resume transition of care upon pt's discharge if appropriate.  Elliot CousinLisa Matthews, RN Care Management Coordinator [facility] Main Office [phone]

## 2016-01-02 NOTE — Progress Notes (Signed)
Verbal order per Dr. Cena BentonVega to remove staples to back of head. 9 staples removed total. Cleansed wound. Patient tolerated well. Will continue to monitor.  Erin Gibson

## 2016-01-02 NOTE — Telephone Encounter (Addendum)
Will send message to schedulers for patient to be set up with a cardiologist with a new patient appointment.

## 2016-01-02 NOTE — Discharge Summary (Signed)
Physician Discharge Summary  Erin Gibson ZOX:[id number] DOB: 12-29-1918 [name]: 12/31/2015  PCP: Rogelia Boga, MD  Admit date: 12/31/2015 Discharge date: 01/02/2016  Time spent: > 35 minutes  Recommendations for Outpatient Follow-up:  1. Monitor potassium levels. Decide when to continue potassium replacement 2. Monitor serum creatinine   Discharge Diagnoses:  Active Problems:   Essential hypertension   GERD   CKD (chronic kidney disease), stage III   Scalp laceration   Hyperkalemia   Chest pain   Discharge Condition: stable  Diet recommendation: Heart healthy  There were no vitals filed for this visit.  History of present illness:  From original HPI: 82 y.o. female who presents to the ED with c/o chest pain. Patient was just discharged from our service a couple of days ago, after a work up for dizziness. During that admit she had slightly bumped troponins up to 0.29, echo was performed; however, due to lack of cardiac symptoms, cardiology did not feel that further ischemic work up was required.  Unfortunately, this evening patient developed onset of left sided back pain which radiates through to the front of her chest  Hospital Course:  Chest/back discomfort - worse with palpation most likely muscle sprain. Pt does not tolerate ibuprofen or tramadol or codeine. As such recommend rest, ice or heat compresses, and higher dose tylenol q 6 hours as needed for pain - troponin negative x 3  For known medical conditions listed above continue medication regimen listed below  Procedures:  none  Consultations:  none  Discharge Exam: Filed Vitals:   01/02/16 0526 01/02/16 1036  BP: 126/35 116/37  Pulse: 66   Temp: 97.9 F (36.6 C)   Resp: 18     General: patient in no acute distress alert and awake Cardiovascular: regular rate and rhythm no rubs or murmurs Respiratory: no increased work of breathing, no wheezes  Discharge Instructions   Discharge  Instructions    Call MD for:  severe uncontrolled pain    Complete by:  As directed      Call MD for:  temperature >100.4    Complete by:  As directed      Diet - low sodium heart healthy    Complete by:  As directed      Discharge instructions    Complete by:  As directed   Please follow up with your primary care physician in 1-2 weeks or sooner should any new concerns arise. May take tylenol 650 mg to  q 6 hours orally for discomfort.     Increase activity slowly    Complete by:  As directed           Current Discharge Medication List    START taking these medications   Details  acetaminophen (TYLENOL) 325 MG tablet Take 2 tablets (650 mg total) by mouth every 4 (four) hours as needed for mild pain or moderate pain. Qty: 30 tablet, Refills: 0    feeding supplement, ENSURE ENLIVE, (ENSURE ENLIVE) LIQD Take 237 mLs by mouth 2 (two) times daily between meals. Qty: 237 mL, Refills: 12      CONTINUE these medications which have NOT CHANGED   Details  Artificial Tear GEL Place 2 drops into both eyes 2 (two) times daily.    aspirin EC 81 MG tablet Take 81 mg by mouth every morning.    Calcium Carbonate-Vitamin D (CALCIUM 600 + D PO) Take 1 tablet by mouth every morning.    Cyanocobalamin (VITAMIN B-12) 2500 MCG SUBL Place  1 tablet under the tongue every morning.    furosemide (LASIX) 40 MG tablet TAKE 1/2 TABLET (=20MG )    DAILY Qty: 45 tablet, Refills: 1    hydrALAZINE (APRESOLINE) 25 MG tablet Take 0.5 tablets (12.5 mg total) by mouth every 8 (eight) hours. Qty: 90 tablet, Refills: 1    Multiple Vitamins-Minerals (PRESERVISION AREDS 2) CAPS Take 1 capsule by mouth daily. Qty: 90 capsule, Refills: 3    nitroGLYCERIN (NITROSTAT) 0.4 MG SL tablet Place 0.4 mg under the tongue every 5 (five) minutes as needed for chest pain.    omeprazole (PRILOSEC) 40 MG capsule Take 1 capsule (40 mg total) by mouth 2 (two) times daily. Qty: 180 capsule, Refills: 1    polyethylene  glycol powder (GLYCOLAX/MIRALAX) powder MIX 17GM IN LIQUID AND     DRINK TWO TIMES A DAY Qty: 3162 g, Refills: 3    quinapril (ACCUPRIL) 40 MG tablet Take 0.5 tablets (20 mg total) by mouth daily. Qty: 90 tablet, Refills: 1    vitamin E (VITAMIN E) 400 UNIT capsule Take 400 Units by mouth every morning.       STOP taking these medications     potassium chloride SA (K-DUR,KLOR-CON) 20 MEQ tablet        Allergies  Allergen Reactions  . Codeine Nausea And Vomiting    Violently ill  . Diltiazem Hcl Other (See Comments)    Not sure about this reaction  . Ibuprofen Nausea And Vomiting  . Tramadol     Auditory and visual hallucinations      The results of significant diagnostics from this hospitalization (including imaging, microbiology, ancillary and laboratory) are listed below for reference.    Significant Diagnostic Studies: Dg Chest 2 View  12/25/2015  CLINICAL DATA:  Fall tonight. Short of breath for 2 weeks. Hypertension. EXAM: CHEST  2 VIEW COMPARISON:  07/21/2015 acute abdomen series. FINDINGS: Osteopenia. Patient rotated right on the frontal radiograph, with convex right thoracic spine curvature. Mild cardiomegaly with transverse aortic atherosclerosis. No pleural effusion or pneumothorax. remote left and possible right sided rib trauma. Moderate left hemidiaphragm elevation. No lobar consolidation. No congestive failure. IMPRESSION: No acute cardiopulmonary disease. Cardiomegaly with moderate left hemidiaphragm elevation. Electronically Signed   By: Jeronimo GreavesKyle  Talbot M.D.   On: 12/25/2015 02:05   Dg Ribs Bilateral  01/01/2016  CLINICAL DATA:  Pain. EXAM: BILATERAL RIBS - 3+ VIEW COMPARISON:  None. FINDINGS: There is a healed right anterior lateral seventh or eighth rib fracture. No acute right rib fractures. No pneumothoraces.  No acute left rib fractures are seen. IMPRESSION: No acute rib fractures or pneumothorax. Electronically Signed   By: Gerome Samavid  Williams III M.D   On: 01/01/2016  13:55   Ct Head Wo Contrast  12/25/2015  CLINICAL DATA:  Status post fall in kitchen, with loss of consciousness. Scalp hematoma and laceration at the occiput. Concern for cervical spine injury. Initial encounter. EXAM: CT HEAD WITHOUT CONTRAST CT CERVICAL SPINE WITHOUT CONTRAST TECHNIQUE: Multidetector CT imaging of the head and cervical spine was performed following the standard protocol without intravenous contrast. Multiplanar CT image reconstructions of the cervical spine were also generated. COMPARISON:  CT of the head and cervical spine performed 12/09/2007 FINDINGS: CT HEAD FINDINGS There is no evidence of acute infarction, mass lesion, or intra- or extra-axial hemorrhage on CT. Prominence of the ventricles and sulci reflects moderate cortical volume loss. Mild cerebellar atrophy is noted. Scattered periventricular and subcortical white matter change likely reflects small vessel ischemic microangiopathy.  Scattered chronic lacunar infarcts are noted at the basal ganglia bilaterally, and at the right cerebellar hemisphere. The brainstem and fourth ventricle are within normal limits. The cerebral hemispheres demonstrate grossly normal gray-white differentiation. No mass effect or midline shift is seen. There is no evidence of fracture; visualized osseous structures are unremarkable in appearance. The visualized portions of the orbits are within normal limits. The paranasal sinuses and mastoid air cells are well-aerated. Soft tissue swelling is noted at the posterior vertex, with associated soft tissue laceration and skin staples. CT CERVICAL SPINE FINDINGS There is no evidence of fracture or subluxation. Vertebral bodies demonstrate normal height and alignment. Multilevel disc space narrowing is noted along the lower cervical and upper thoracic spine, with scattered anterior and posterior disc osteophyte complexes. Prevertebral soft tissues are within normal limits. The thyroid gland is unremarkable in  appearance. The visualized lung apices are clear. No significant soft tissue abnormalities are seen. IMPRESSION: 1. No evidence for traumatic intracranial injury or fracture. 2. No evidence of fracture or subluxation along the cervical spine. 3. Moderate cortical volume loss and scattered small vessel ischemic microangiopathy. 4. Scattered chronic lacunar infarcts at the basal ganglia bilaterally, and at the right cerebellar hemisphere. 5. Soft tissue swelling at the posterior vertex, with associated soft tissue laceration and skin staples. 6. Mild degenerative change at the lower cervical and upper thoracic spine. Electronically Signed   By: Roanna Raider M.D.   On: 12/25/2015 01:51   Ct Cervical Spine Wo Contrast  12/25/2015  CLINICAL DATA:  Status post fall in kitchen, with loss of consciousness. Scalp hematoma and laceration at the occiput. Concern for cervical spine injury. Initial encounter. EXAM: CT HEAD WITHOUT CONTRAST CT CERVICAL SPINE WITHOUT CONTRAST TECHNIQUE: Multidetector CT imaging of the head and cervical spine was performed following the standard protocol without intravenous contrast. Multiplanar CT image reconstructions of the cervical spine were also generated. COMPARISON:  CT of the head and cervical spine performed 12/09/2007 FINDINGS: CT HEAD FINDINGS There is no evidence of acute infarction, mass lesion, or intra- or extra-axial hemorrhage on CT. Prominence of the ventricles and sulci reflects moderate cortical volume loss. Mild cerebellar atrophy is noted. Scattered periventricular and subcortical white matter change likely reflects small vessel ischemic microangiopathy. Scattered chronic lacunar infarcts are noted at the basal ganglia bilaterally, and at the right cerebellar hemisphere. The brainstem and fourth ventricle are within normal limits. The cerebral hemispheres demonstrate grossly normal gray-white differentiation. No mass effect or midline shift is seen. There is no evidence  of fracture; visualized osseous structures are unremarkable in appearance. The visualized portions of the orbits are within normal limits. The paranasal sinuses and mastoid air cells are well-aerated. Soft tissue swelling is noted at the posterior vertex, with associated soft tissue laceration and skin staples. CT CERVICAL SPINE FINDINGS There is no evidence of fracture or subluxation. Vertebral bodies demonstrate normal height and alignment. Multilevel disc space narrowing is noted along the lower cervical and upper thoracic spine, with scattered anterior and posterior disc osteophyte complexes. Prevertebral soft tissues are within normal limits. The thyroid gland is unremarkable in appearance. The visualized lung apices are clear. No significant soft tissue abnormalities are seen. IMPRESSION: 1. No evidence for traumatic intracranial injury or fracture. 2. No evidence of fracture or subluxation along the cervical spine. 3. Moderate cortical volume loss and scattered small vessel ischemic microangiopathy. 4. Scattered chronic lacunar infarcts at the basal ganglia bilaterally, and at the right cerebellar hemisphere. 5. Soft tissue swelling at the  posterior vertex, with associated soft tissue laceration and skin staples. 6. Mild degenerative change at the lower cervical and upper thoracic spine. Electronically Signed   By: Roanna Raider M.D.   On: 12/25/2015 01:51   Dg Chest Port 1 View  01/01/2016  CLINICAL DATA:  Back pain radiating into the chest, predominantly left-sided. EXAM: PORTABLE CHEST 1 VIEW COMPARISON:  12/25/2015 FINDINGS: There is unchanged left hemidiaphragm elevation. Unchanged mild curvilinear scarring or atelectasis above the elevated left hemidiaphragm. No alveolar consolidation. No large effusions. No pneumothorax. Hilar and mediastinal contours are unremarkable and unchanged. Marked thoracic scoliosis. IMPRESSION: No acute cardiopulmonary findings. Electronically Signed   By: Ellery Plunk M.D.   On: 01/01/2016 00:57    Microbiology: Recent Results (from the past 240 hour(s))  Urine culture     Status: None   Collection Time: 12/25/15 12:41 AM  Result Value Ref Range Status   Specimen Description URINE, RANDOM  Final   Special Requests NONE  Final   Culture >=100,000 COLONIES/mL ESCHERICHIA COLI  Final   Report Status 12/27/2015 FINAL  Final   Organism ID, Bacteria ESCHERICHIA COLI  Final      Susceptibility   Escherichia coli - MIC*    AMPICILLIN 8 SENSITIVE Sensitive     CEFAZOLIN <=4 SENSITIVE Sensitive     CEFTRIAXONE <=1 SENSITIVE Sensitive     CIPROFLOXACIN <=0.25 SENSITIVE Sensitive     GENTAMICIN <=1 SENSITIVE Sensitive     IMIPENEM <=0.25 SENSITIVE Sensitive     NITROFURANTOIN <=16 SENSITIVE Sensitive     TRIMETH/SULFA <=20 SENSITIVE Sensitive     AMPICILLIN/SULBACTAM 4 SENSITIVE Sensitive     PIP/TAZO <=4 SENSITIVE Sensitive     * >=100,000 COLONIES/mL ESCHERICHIA COLI     Labs: Basic Metabolic Panel:  Recent Labs Lab 01/01/16 0042  NA 139  K 5.2*  CL 105  CO2 23  GLUCOSE 107*  BUN 34*  CREATININE 1.55*  CALCIUM 9.1   Liver Function Tests:  Recent Labs Lab 01/01/16 0042  AST 17  ALT 11*  ALKPHOS 101  BILITOT 0.7  PROT 6.4*  ALBUMIN 3.0*   No results for input(s): LIPASE, AMYLASE in the last 168 hours. No results for input(s): AMMONIA in the last 168 hours. CBC:  Recent Labs Lab 01/01/16 0042  WBC 7.1  NEUTROABS 4.9  HGB 10.3*  HCT 32.7*  MCV 91.9  PLT 202   Cardiac Enzymes:  Recent Labs Lab 01/01/16 0258 01/01/16 0421 01/01/16 0804 01/01/16 1518  TROPONINI <0.03 <0.03 <0.03 <0.03   BNP: BNP (last 3 results) No results for input(s): BNP in the last 8760 hours.  ProBNP (last 3 results) No results for input(s): PROBNP in the last 8760 hours.  CBG: No results for input(s): GLUCAP in the last 168 hours.    Signed:  Penny Pia MD.  [facility] 01/02/2016, 2:01 PM

## 2016-01-02 NOTE — Progress Notes (Signed)
Patient discharged before meds were able to be wasted in pixix. Im wasting 1.3875ml of fentyl x2. Each vial containing 1.7775ml of fentanyl. Waste was witnessed.  Nicha Hemann N Kort Stettler  Witnessed Lasheka Kempner [employer] waste 1.4875mL x2 of fentanyl. Reynold Boweneresa Mainiero, RN

## 2016-01-02 NOTE — Care Management Note (Signed)
Case Management Note Donn PieriniKristi Madison Direnzo RN, BSN Unit 2W-Case Manager [phone]  Patient Details  Name: Erin CirriViolet W Erich MRN: [id number] Date of Birth: 04/21/1919  Subjective/Objective:  Pt admitted with chest pain                  Action/Plan: PTA pt was at home- per conversation with pt she states that her daughter has arranged for caregivers in the home (she names one of the caregivers as Bethann Berkshirerisha) she reports that the caregivers are there most of the time. [facility] also [facility] on her last admission- [facility] Cor[facility]Mary [facility] will resume tomorrow. Pt states that she uses a walker at home and also has a tub bench- pt states that he daughter is on her way to take her home ([place])  Expected Discharge Date:   01/02/16               Expected Discharge Plan:  Home/Self Care  In-House Referral:     Discharge planning Services  CM Consult  Post Acute Care Choice:  Home Health, Resumption of Svcs/PTA Provider Choice offered to:     DME Arranged:    DME Agency:     HH Arranged:  RN, PT, OT, Nurse's Aide HH Agency:  [facility]  Status of Service:  Completed, signed off  Medicare Important Message Given:    Date Medicare IM Given:    Medicare IM give by:    Date Additional Medicare IM Given:    Additional Medicare Important Message give by:     If discussed at Long Length of Stay Meetings, dates discussed:    Additional Comments:  Darrold SpanWebster, Jodeci Rini Hall, RN 01/02/2016, 3:15 PM

## 2016-01-04 ENCOUNTER — Encounter: Payer: Self-pay | Admitting: *Deleted

## 2016-01-04 ENCOUNTER — Other Ambulatory Visit: Payer: Self-pay | Admitting: *Deleted

## 2016-01-04 NOTE — Patient Outreach (Signed)
[facility]) Care Management  01/04/2016  Dawayne CirriViolet W Silvera 05/20/1919 [id number]  Pt readmitted with a discharge date of 4/10. RN contacted pt today and reintroduced [facility] services and again the purpose for today's call. Pt remembers and states her visit to the hospital this time was related to back pain "think it was chest pain". Pt states she is doing well and continues to have around the clock adie services arranged by her daughter. States all her upcoming follow up appointments with a cardiologist and on 4/18 with her primary doctor. No other issues at this time as pt mentioned [facility] was involved with a RN visit prior to this last admission but not aware if they will continue. RN encouraged pt to follow up with the agency and inquire on ongoing services. Pt states no addition falls and she continues to have 24 aide services. Pt verified she has transportation to all her upcoming appointments with no problems. RN offered for an extended home visit however pt feels she is able to manager her care with the involved services and the assistance of her daughter.RN also offered to continue weekly transition of care calls however pt declined. No other request or inquires at this time. RN will close this case however left [facility] contact number and this RN case manager's name if services or assistance is needed in the future. Pt very grateful for the call. Case will be closed and pt aware that her primary will be notified of the case closure.   Elliot CousinLisa Latrel Szymczak, RN Care Management Coordinator [facility] Main Office [phone]

## 2016-01-10 ENCOUNTER — Other Ambulatory Visit: Payer: Self-pay | Admitting: *Deleted

## 2016-01-10 ENCOUNTER — Ambulatory Visit (INDEPENDENT_AMBULATORY_CARE_PROVIDER_SITE_OTHER): Payer: Medicare Other | Admitting: Internal Medicine

## 2016-01-10 ENCOUNTER — Encounter: Payer: Self-pay | Admitting: Internal Medicine

## 2016-01-10 VITALS — BP 150/64 | HR 67 | Temp 98.1°F | Resp 20 | Ht 59.0 in | Wt 106.0 lb

## 2016-01-10 DIAGNOSIS — R269 Unspecified abnormalities of gait and mobility: Secondary | ICD-10-CM

## 2016-01-10 DIAGNOSIS — N183 Chronic kidney disease, stage 3 unspecified: Secondary | ICD-10-CM

## 2016-01-10 DIAGNOSIS — I10 Essential (primary) hypertension: Secondary | ICD-10-CM | POA: Diagnosis not present

## 2016-01-10 DIAGNOSIS — D509 Iron deficiency anemia, unspecified: Secondary | ICD-10-CM | POA: Diagnosis not present

## 2016-01-10 MED ORDER — MECLIZINE HCL 25 MG PO TABS: 25.0000 mg | ORAL_TABLET | Freq: Three times a day (TID) | ORAL | Status: DC | PRN

## 2016-01-10 MED ORDER — FUROSEMIDE 40 MG PO TABS: ORAL_TABLET | ORAL | Status: DC

## 2016-01-10 NOTE — Progress Notes (Signed)
Subjective:    Patient ID: Erin Gibson, female    DOB: 03/12/1919, 82 y.o.   MRN: [id number]  HPI  Admit date: 12/31/2015 Discharge date: 01/02/2016  Recommendations for Outpatient Follow-up:  1. Monitor potassium levels. Decide when to continue potassium replacement 2. Monitor serum creatinine   Discharge Diagnoses:  Active Problems:  Essential hypertension  GERD  CKD (chronic kidney disease), stage III  Scalp laceration  Hyperkalemia  Chest pain  82 year old patient who is seen today for follow-up after 2 recent hospital admissions and for TCM  Since her discharge 8 days ago.  She has done well.  Denies any further chest pain.  Denies any further episodes of dizziness or syncope She has essential hypertension She does have a sitter throughout the day.  No recent falls.  She does have significant osteoarthritis with a chief complaint of shoulder pain  Past Medical History  Diagnosis Date  . ANEMIA 03/31/2009  . DEGENERATIVE JOINT DISEASE, GENERALIZED 05/06/2007  . GERD 04/24/2007  . HYPERTENSION 04/24/2007  . VENOUS STASIS ULCER 07/20/2008  . DVT (deep venous thrombosis) (HCC)     hx of  . Chronic kidney disease   . Diverticulosis of sigmoid colon     Colonoscopy 2011     Social History   Social History  . Marital Status: Widowed    Spouse Name: N/A  . Number of Children: N/A  . Years of Education: N/A   Occupational History  . Not on file.   Social History Main Topics  . Smoking status: Never Smoker   . Smokeless tobacco: Never Used  . Alcohol Use: No  . Drug Use: No  . Sexual Activity: Not on file   Other Topics Concern  . Not on file   Social History Narrative    Past Surgical History  Procedure Laterality Date  . Cataract extraction    . Abdominal hysterectomy      Completion hysterectomy  . Total knee arthroplasty      x2  . Hernia repair    . Cholecystectomy open    . Partial hysterectomy      Salpingo-oophorectomy as well    No  family history on file.  Allergies  Allergen Reactions  . Codeine Nausea And Vomiting    Violently ill  . Diltiazem Hcl Other (See Comments)    Not sure about this reaction  . Ibuprofen Nausea And Vomiting  . Tramadol     Auditory and visual hallucinations    Current Outpatient Prescriptions on File Prior to Visit  Medication Sig Dispense Refill  . acetaminophen (TYLENOL) 325 MG tablet Take 2 tablets (650 mg total) by mouth every 4 (four) hours as needed for mild pain or moderate pain. 30 tablet 0  . Artificial Tear GEL Place 2 drops into both eyes 2 (two) times daily.    Marland Kitchen. aspirin EC 81 MG tablet Take 81 mg by mouth every morning.    . Calcium Carbonate-Vitamin D (CALCIUM 600 + D PO) Take 1 tablet by mouth every morning.    . Cyanocobalamin (VITAMIN B-12) 2500 MCG SUBL Place 1 tablet under the tongue every morning.    . feeding supplement, ENSURE ENLIVE, (ENSURE ENLIVE) LIQD Take 237 mLs by mouth 2 (two) times daily between meals. 237 mL 12  . furosemide (LASIX) 40 MG tablet TAKE 1/2 TABLET (=20MG )    DAILY 45 tablet 1  . hydrALAZINE (APRESOLINE) 25 MG tablet Take 0.5 tablets (12.5 mg total) by mouth every 8 (  eight) hours. 90 tablet 1  . Multiple Vitamins-Minerals (PRESERVISION AREDS 2) CAPS Take 1 capsule by mouth daily. 90 capsule 3  . nitroGLYCERIN (NITROSTAT) 0.4 MG SL tablet Place 0.4 mg under the tongue every 5 (five) minutes as needed for chest pain.    Marland Kitchen omeprazole (PRILOSEC) 40 MG capsule Take 1 capsule (40 mg total) by mouth 2 (two) times daily. (Patient taking differently: Take 40 mg by mouth every morning. ) 180 capsule 1  . polyethylene glycol powder (GLYCOLAX/MIRALAX) powder MIX 17GM IN LIQUID AND     DRINK TWO TIMES A DAY 3162 g 3  . quinapril (ACCUPRIL) 40 MG tablet Take 0.5 tablets (20 mg total) by mouth daily. 90 tablet 1  . vitamin E (VITAMIN E) 400 UNIT capsule Take 400 Units by mouth every morning.      No current facility-administered medications on file prior to  visit.    BP 150/64 mmHg  Pulse 67  Temp(Src) 98.1 F (36.7 C) (Oral)  Resp 20  Ht  (1.499 m)  Wt 106 lb (48.081 kg)  BMI 21.40 kg/m2  SpO2 98%     Review of Systems  Constitutional: Negative.   HENT: Negative for congestion, dental problem, hearing loss, rhinorrhea, sinus pressure, sore throat and tinnitus.   Eyes: Negative for pain, discharge and visual disturbance.  Respiratory: Negative for cough and shortness of breath.   Cardiovascular: Negative for chest pain, palpitations and leg swelling.  Gastrointestinal: Negative for nausea, vomiting, abdominal pain, diarrhea, constipation, blood in stool and abdominal distention.  Genitourinary: Negative for dysuria, urgency, frequency, hematuria, flank pain, vaginal bleeding, vaginal discharge, difficulty urinating, vaginal pain and pelvic pain.  Musculoskeletal: Positive for back pain, arthralgias and gait problem. Negative for joint swelling.  Skin: Negative for rash.  Neurological: Positive for dizziness and light-headedness. Negative for syncope, speech difficulty, weakness, numbness and headaches.  Hematological: Negative for adenopathy.  Psychiatric/Behavioral: Negative for behavioral problems, dysphoric mood and agitation. The patient is not nervous/anxious.        Objective:   Physical Exam  Constitutional: She is oriented to person, place, and time. She appears well-developed and well-nourished. No distress.  Blood pressure 150/70   Alert and appropriate  HENT:  Head: Normocephalic.  Right Ear: External ear normal.  Left Ear: External ear normal.  Mouth/Throat: Oropharynx is clear and moist.  Eyes: Conjunctivae and EOM are normal. Pupils are equal, round, and reactive to light.  Neck: Normal range of motion. Neck supple. No thyromegaly present.  Cardiovascular: Normal rate, regular rhythm and intact distal pulses.   Murmur heard. Pulmonary/Chest: Effort normal and breath sounds normal.  Abdominal: Soft.  Bowel sounds are normal. She exhibits no mass. There is no tenderness.  Musculoskeletal: Normal range of motion. She exhibits no edema.  Marked osteoarthritic changes of the small joints of the hands  Lymphadenopathy:    She has no cervical adenopathy.  Neurological: She is alert and oriented to person, place, and time.  Skin: Skin is warm and dry. No rash noted.  Psychiatric: She has a normal mood and affect. Her behavior is normal.          Assessment & Plan:   Hypertension Chronic kidney disease Osteoarthritis.  Continue Tylenol when necessaryHistory of recent fall Abnormal gait.  History of recent fall with scalp laceration  No change in medical regimen Recheck one month with follow-up lab We'll continue to hold potassium supplement at this time

## 2016-01-10 NOTE — Patient Instructions (Signed)
Limit your sodium (Salt) intake  Return in one month for follow-up   No change in medical therapy

## 2016-01-10 NOTE — Progress Notes (Signed)
Pre visit review using our clinic review tool, if applicable. No additional management support is needed unless otherwise documented below in the visit note. 

## 2016-01-19 ENCOUNTER — Ambulatory Visit: Payer: Federal, State, Local not specified - PPO | Admitting: Internal Medicine

## 2016-01-20 ENCOUNTER — Ambulatory Visit: Payer: Federal, State, Local not specified - PPO | Admitting: Internal Medicine

## 2016-01-30 ENCOUNTER — Ambulatory Visit: Payer: Medicare Other | Admitting: Cardiovascular Disease

## 2016-01-30 DIAGNOSIS — I951 Orthostatic hypotension: Secondary | ICD-10-CM | POA: Diagnosis not present

## 2016-01-30 DIAGNOSIS — I129 Hypertensive chronic kidney disease with stage 1 through stage 4 chronic kidney disease, or unspecified chronic kidney disease: Secondary | ICD-10-CM | POA: Diagnosis not present

## 2016-01-30 DIAGNOSIS — R55 Syncope and collapse: Secondary | ICD-10-CM | POA: Diagnosis not present

## 2016-01-30 DIAGNOSIS — S0101XA Laceration without foreign body of scalp, initial encounter: Secondary | ICD-10-CM | POA: Diagnosis not present

## 2016-02-07 ENCOUNTER — Encounter: Payer: Self-pay | Admitting: Internal Medicine

## 2016-02-07 ENCOUNTER — Ambulatory Visit (INDEPENDENT_AMBULATORY_CARE_PROVIDER_SITE_OTHER): Payer: Medicare Other | Admitting: Internal Medicine

## 2016-02-07 VITALS — BP 136/80 | HR 82 | Temp 97.9°F | Resp 20 | Ht 59.0 in | Wt 110.0 lb

## 2016-02-07 DIAGNOSIS — I10 Essential (primary) hypertension: Secondary | ICD-10-CM

## 2016-02-07 DIAGNOSIS — D500 Iron deficiency anemia secondary to blood loss (chronic): Secondary | ICD-10-CM

## 2016-02-07 DIAGNOSIS — H353123 Nonexudative age-related macular degeneration, left eye, advanced atrophic without subfoveal involvement: Secondary | ICD-10-CM | POA: Diagnosis not present

## 2016-02-07 DIAGNOSIS — N183 Chronic kidney disease, stage 3 unspecified: Secondary | ICD-10-CM

## 2016-02-07 DIAGNOSIS — E875 Hyperkalemia: Secondary | ICD-10-CM

## 2016-02-07 DIAGNOSIS — H353212 Exudative age-related macular degeneration, right eye, with inactive choroidal neovascularization: Secondary | ICD-10-CM | POA: Diagnosis not present

## 2016-02-07 DIAGNOSIS — H34231 Retinal artery branch occlusion, right eye: Secondary | ICD-10-CM | POA: Diagnosis not present

## 2016-02-07 DIAGNOSIS — H43813 Vitreous degeneration, bilateral: Secondary | ICD-10-CM | POA: Diagnosis not present

## 2016-02-07 LAB — CBC WITH DIFFERENTIAL/PLATELET
BASOS PCT: 0.6 % (ref 0.0–3.0)
Basophils Absolute: 0 10*3/uL (ref 0.0–0.1)
EOS PCT: 2.6 % (ref 0.0–5.0)
Eosinophils Absolute: 0.1 10*3/uL (ref 0.0–0.7)
HEMATOCRIT: 33.7 % — AB (ref 36.0–46.0)
HEMOGLOBIN: 11.1 g/dL — AB (ref 12.0–15.0)
LYMPHS PCT: 19.2 % (ref 12.0–46.0)
Lymphs Abs: 1 10*3/uL (ref 0.7–4.0)
MCHC: 32.8 g/dL (ref 30.0–36.0)
MCV: 90.5 fl (ref 78.0–100.0)
MONOS PCT: 7.6 % (ref 3.0–12.0)
Monocytes Absolute: 0.4 10*3/uL (ref 0.1–1.0)
Neutro Abs: 3.7 10*3/uL (ref 1.4–7.7)
Neutrophils Relative %: 70 % (ref 43.0–77.0)
Platelets: 206 10*3/uL (ref 150.0–400.0)
RBC: 3.73 Mil/uL — AB (ref 3.87–5.11)
RDW: 14.2 % (ref 11.5–15.5)
WBC: 5.3 10*3/uL (ref 4.0–10.5)

## 2016-02-07 LAB — BASIC METABOLIC PANEL
BUN: 43 mg/dL — AB (ref 6–23)
CHLORIDE: 109 meq/L (ref 96–112)
CO2: 26 mEq/L (ref 19–32)
Calcium: 9.3 mg/dL (ref 8.4–10.5)
Creatinine, Ser: 1.56 mg/dL — ABNORMAL HIGH (ref 0.40–1.20)
GFR: 32.75 mL/min — AB (ref >60.00)
Glucose, Bld: 95 mg/dL (ref 70–99)
POTASSIUM: 5.4 meq/L — AB (ref 3.5–5.1)
SODIUM: 140 meq/L (ref 135–145)

## 2016-02-07 NOTE — Progress Notes (Signed)
Subjective:    Patient ID: Erin Gibson, female    DOB: 1918-10-27, 82 y.o.   MRN: [id number]  HPI  82 year old patient who is seen today for follow-up of hypertension.  She has had a recent hospital admission and noted to have hyperkalemia.  Potassium supplementation has been on hold.  She generally feels well.  She does monitor home blood pressure readings.  She does note some occasional high systolic readings.  She is on multiple blood pressure medications.  She feels well today  Past Medical History  Diagnosis Date  . ANEMIA 03/31/2009  . DEGENERATIVE JOINT DISEASE, GENERALIZED 05/06/2007  . GERD 04/24/2007  . HYPERTENSION 04/24/2007  . VENOUS STASIS ULCER 07/20/2008  . DVT (deep venous thrombosis) (HCC)     hx of  . Chronic kidney disease   . Diverticulosis of sigmoid colon     Colonoscopy 2011     Social History   Social History  . Marital Status: Widowed    Spouse Name: N/A  . Number of Children: N/A  . Years of Education: N/A   Occupational History  . Not on file.   Social History Main Topics  . Smoking status: Never Smoker   . Smokeless tobacco: Never Used  . Alcohol Use: No  . Drug Use: No  . Sexual Activity: Not on file   Other Topics Concern  . Not on file   Social History Narrative    Past Surgical History  Procedure Laterality Date  . Cataract extraction    . Abdominal hysterectomy      Completion hysterectomy  . Total knee arthroplasty      x2  . Hernia repair    . Cholecystectomy open    . Partial hysterectomy      Salpingo-oophorectomy as well    No family history on file.  Allergies  Allergen Reactions  . Codeine Nausea And Vomiting    Violently ill  . Diltiazem Hcl Other (See Comments)    Not sure about this reaction  . Ibuprofen Nausea And Vomiting  . Tramadol     Auditory and visual hallucinations    Current Outpatient Prescriptions on File Prior to Visit  Medication Sig Dispense Refill  . acetaminophen (TYLENOL) 325 MG  tablet Take 2 tablets (650 mg total) by mouth every 4 (four) hours as needed for mild pain or moderate pain. 30 tablet 0  . Artificial Tear GEL Place 2 drops into both eyes 2 (two) times daily.    Marland Kitchen aspirin EC 81 MG tablet Take 81 mg by mouth every morning.    . Calcium Carbonate-Vitamin D (CALCIUM 600 + D PO) Take 1 tablet by mouth every morning.    . Cyanocobalamin (VITAMIN B-12) 2500 MCG SUBL Place 1 tablet under the tongue every morning.    . feeding supplement, ENSURE ENLIVE, (ENSURE ENLIVE) LIQD Take 237 mLs by mouth 2 (two) times daily between meals. 237 mL 12  . furosemide (LASIX) 40 MG tablet TAKE 1/2 TABLET (=20MG )    DAILY 45 tablet 3  . hydrALAZINE (APRESOLINE) 25 MG tablet Take 0.5 tablets (12.5 mg total) by mouth every 8 (eight) hours. 90 tablet 1  . KLOR-CON M20 20 MEQ tablet     . meclizine (MEDI-MECLIZINE) 25 MG tablet Take 1 tablet (25 mg total) by mouth 3 (three) times daily as needed for dizziness. 90 tablet 1  . Multiple Vitamins-Minerals (PRESERVISION AREDS 2) CAPS Take 1 capsule by mouth daily. 90 capsule 3  . nitroGLYCERIN (  NITROSTAT) 0.4 MG SL tablet Place 0.4 mg under the tongue every 5 (five) minutes as needed for chest pain.    Marland Kitchen. omeprazole (PRILOSEC) 40 MG capsule Take 1 capsule (40 mg total) by mouth 2 (two) times daily. (Patient taking differently: Take 40 mg by mouth every morning. ) 180 capsule 1  . polyethylene glycol powder (GLYCOLAX/MIRALAX) powder MIX 17GM IN LIQUID AND     DRINK TWO TIMES A DAY 3162 g 3  . quinapril (ACCUPRIL) 40 MG tablet Take 0.5 tablets (20 mg total) by mouth daily. 90 tablet 1  . vitamin E (VITAMIN E) 400 UNIT capsule Take 400 Units by mouth every morning.      No current facility-administered medications on file prior to visit.    BP 136/80 mmHg  Pulse 82  Temp(Src) 97.9 F (36.6 C) (Oral)  Resp 20  Ht 4\' 11"  (1.499 m)  Wt 110 lb (49.896 kg)  BMI 22.21 kg/m2  SpO2 97%     Review of Systems  Constitutional: Positive for  fatigue.  HENT: Negative for congestion, dental problem, hearing loss, rhinorrhea, sinus pressure, sore throat and tinnitus.   Eyes: Negative for pain, discharge and visual disturbance.  Respiratory: Negative for cough and shortness of breath.   Cardiovascular: Negative for chest pain, palpitations and leg swelling.  Gastrointestinal: Negative for nausea, vomiting, abdominal pain, diarrhea, constipation, blood in stool and abdominal distention.  Genitourinary: Negative for dysuria, urgency, frequency, hematuria, flank pain, vaginal bleeding, vaginal discharge, difficulty urinating, vaginal pain and pelvic pain.  Musculoskeletal: Negative for joint swelling, arthralgias and gait problem.  Skin: Negative for rash.  Neurological: Negative for dizziness, syncope, speech difficulty, weakness, numbness and headaches.  Hematological: Negative for adenopathy.  Psychiatric/Behavioral: Negative for behavioral problems, dysphoric mood and agitation. The patient is not nervous/anxious.        Objective:   Physical Exam  Constitutional: She is oriented to person, place, and time. She appears well-developed and well-nourished.  Blood pressure as well as 120 over 60  HENT:  Head: Normocephalic.  Right Ear: External ear normal.  Left Ear: External ear normal.  Mouth/Throat: Oropharynx is clear and moist.  Eyes: Conjunctivae and EOM are normal. Pupils are equal, round, and reactive to light.  Neck: Normal range of motion. Neck supple. No thyromegaly present.  Cardiovascular: Normal rate, regular rhythm and intact distal pulses.   Murmur heard. Grade 3/6 systolic murmur  Pulmonary/Chest: Effort normal and breath sounds normal.  Abdominal: Soft. Bowel sounds are normal. She exhibits no mass. There is no tenderness.  Musculoskeletal: Normal range of motion. She exhibits edema.  Support stockings in place Minimal left ankle edema only  Lymphadenopathy:    She has no cervical adenopathy.  Neurological:  She is alert and oriented to person, place, and time.  Skin: Skin is warm and dry. No rash noted.  Psychiatric: She has a normal mood and affect. Her behavior is normal.          Assessment & Plan:   Hypertension.  Well-controlled.  No change in medical regimen History of hyperkalemia.  Check the electrolytes History of anemia.  Will check a follow-up CBC  Clinically stable Recheck 4 months or as needed

## 2016-02-07 NOTE — Patient Instructions (Signed)
Limit your sodium (Salt) intake  Please check your blood pressure on a regular basis.  If it is consistently greater than 150/90, please make an office appointment.  Return in 4 months for follow-up  

## 2016-02-07 NOTE — Progress Notes (Signed)
Pre visit review using our clinic review tool, if applicable. No additional management support is needed unless otherwise documented below in the visit note. 

## 2016-02-16 ENCOUNTER — Emergency Department (HOSPITAL_COMMUNITY)
Admission: EM | Admit: 2016-02-16 | Discharge: 2016-02-16 | Disposition: A | Discharge status: Home / Self Care | Payer: Medicare Other | Attending: Emergency Medicine | Admitting: Emergency Medicine

## 2016-02-16 ENCOUNTER — Ambulatory Visit (INDEPENDENT_AMBULATORY_CARE_PROVIDER_SITE_OTHER): Payer: Medicare Other | Admitting: Family Medicine

## 2016-02-16 ENCOUNTER — Ambulatory Visit (HOSPITAL_COMMUNITY): Admission: RE | Admit: 2016-02-16 | Payer: Medicare Other | Source: Ambulatory Visit

## 2016-02-16 ENCOUNTER — Emergency Department (HOSPITAL_COMMUNITY): Payer: Medicare Other

## 2016-02-16 ENCOUNTER — Emergency Department (HOSPITAL_BASED_OUTPATIENT_CLINIC_OR_DEPARTMENT_OTHER)
Admit: 2016-02-16 | Discharge: 2016-02-16 | Disposition: A | Discharge status: Home / Self Care | Payer: Medicare Other | Attending: Emergency Medicine | Admitting: Emergency Medicine

## 2016-02-16 ENCOUNTER — Encounter: Payer: Self-pay | Admitting: Family Medicine

## 2016-02-16 VITALS — BP 182/99 | HR 73 | Temp 98.1°F | Wt 113.0 lb

## 2016-02-16 DIAGNOSIS — R609 Edema, unspecified: Secondary | ICD-10-CM

## 2016-02-16 DIAGNOSIS — R0602 Shortness of breath: Secondary | ICD-10-CM | POA: Diagnosis not present

## 2016-02-16 DIAGNOSIS — Z79899 Other long term (current) drug therapy: Secondary | ICD-10-CM | POA: Diagnosis not present

## 2016-02-16 DIAGNOSIS — N189 Chronic kidney disease, unspecified: Secondary | ICD-10-CM | POA: Diagnosis not present

## 2016-02-16 DIAGNOSIS — M79605 Pain in left leg: Secondary | ICD-10-CM | POA: Diagnosis present

## 2016-02-16 DIAGNOSIS — K219 Gastro-esophageal reflux disease without esophagitis: Secondary | ICD-10-CM | POA: Diagnosis not present

## 2016-02-16 DIAGNOSIS — M1990 Unspecified osteoarthritis, unspecified site: Secondary | ICD-10-CM | POA: Diagnosis not present

## 2016-02-16 DIAGNOSIS — Z86718 Personal history of other venous thrombosis and embolism: Secondary | ICD-10-CM | POA: Diagnosis not present

## 2016-02-16 DIAGNOSIS — Z7982 Long term (current) use of aspirin: Secondary | ICD-10-CM | POA: Diagnosis not present

## 2016-02-16 DIAGNOSIS — L03116 Cellulitis of left lower limb: Secondary | ICD-10-CM | POA: Diagnosis not present

## 2016-02-16 DIAGNOSIS — Z862 Personal history of diseases of the blood and blood-forming organs and certain disorders involving the immune mechanism: Secondary | ICD-10-CM | POA: Insufficient documentation

## 2016-02-16 DIAGNOSIS — I129 Hypertensive chronic kidney disease with stage 1 through stage 4 chronic kidney disease, or unspecified chronic kidney disease: Secondary | ICD-10-CM | POA: Insufficient documentation

## 2016-02-16 DIAGNOSIS — M7989 Other specified soft tissue disorders: Secondary | ICD-10-CM | POA: Diagnosis not present

## 2016-02-16 DIAGNOSIS — M79609 Pain in unspecified limb: Secondary | ICD-10-CM

## 2016-02-16 DIAGNOSIS — R0609 Other forms of dyspnea: Secondary | ICD-10-CM

## 2016-02-16 LAB — COMPREHENSIVE METABOLIC PANEL
ALK PHOS: 115 U/L (ref 38–126)
ALT: 10 U/L (ref 0–35)
ALT: 11 U/L — AB (ref 14–54)
ANION GAP: 7 (ref 5–15)
AST: 17 U/L (ref 0–37)
AST: 19 U/L (ref 15–41)
Albumin: 3.7 g/dL (ref 3.5–5.2)
Albumin: 3 g/dL — ABNORMAL LOW (ref 3.5–5.0)
Alkaline Phosphatase: 130 U/L — ABNORMAL HIGH (ref 39–117)
BILIRUBIN TOTAL: 0.6 mg/dL (ref 0.2–1.2)
BUN: 39 mg/dL — ABNORMAL HIGH (ref 6–23)
BUN: 37 mg/dL — ABNORMAL HIGH (ref 6–20)
CALCIUM: 9.5 mg/dL (ref 8.4–10.5)
CALCIUM: 9.2 mg/dL (ref 8.9–10.3)
CO2: 25 meq/L (ref 19–32)
CO2: 22 mmol/L (ref 22–32)
CREATININE: 1.55 mg/dL — AB (ref 0.40–1.20)
CREATININE: 1.58 mg/dL — AB (ref 0.44–1.00)
Chloride: 109 mEq/L (ref 96–112)
Chloride: 110 mmol/L (ref 101–111)
GFR calc non Af Amer: 27 mL/min — ABNORMAL LOW (ref >60)
GFR, EST AFRICAN AMERICAN: 31 mL/min — AB (ref >60)
GFR: 33 mL/min — ABNORMAL LOW (ref >60.00)
GLUCOSE: 58 mg/dL — AB (ref 70–99)
Glucose, Bld: 83 mg/dL (ref 65–99)
Potassium: 5.2 mEq/L — ABNORMAL HIGH (ref 3.5–5.1)
Potassium: 4.8 mmol/L (ref 3.5–5.1)
SODIUM: 139 meq/L (ref 135–145)
Sodium: 139 mmol/L (ref 135–145)
TOTAL PROTEIN: 6.1 g/dL — AB (ref 6.5–8.1)
Total Bilirubin: 0.7 mg/dL (ref 0.3–1.2)
Total Protein: 6.3 g/dL (ref 6.0–8.3)

## 2016-02-16 LAB — CBC WITH DIFFERENTIAL/PLATELET
BASOS ABS: 0 10*3/uL (ref 0.0–0.1)
BASOS PCT: 0.5 % (ref 0.0–3.0)
BASOS PCT: 1 %
Basophils Absolute: 0.1 10*3/uL (ref 0.0–0.1)
EOS ABS: 0.1 10*3/uL (ref 0.0–0.7)
EOS ABS: 0.1 10*3/uL (ref 0.0–0.7)
Eosinophils Relative: 2.2 % (ref 0.0–5.0)
Eosinophils Relative: 2 %
HCT: 35.2 % — ABNORMAL LOW (ref 36.0–46.0)
HCT: 33.6 % — ABNORMAL LOW (ref 36.0–46.0)
HEMOGLOBIN: 10.9 g/dL — AB (ref 12.0–15.0)
Hemoglobin: 11.4 g/dL — ABNORMAL LOW (ref 12.0–15.0)
LYMPHS ABS: 1.2 10*3/uL (ref 0.7–4.0)
LYMPHS PCT: 20.6 % (ref 12.0–46.0)
LYMPHS PCT: 22 %
Lymphs Abs: 1.2 10*3/uL (ref 0.7–4.0)
MCH: 29.1 pg (ref 26.0–34.0)
MCHC: 32.5 g/dL (ref 30.0–36.0)
MCHC: 32.4 g/dL (ref 30.0–36.0)
MCV: 90 fl (ref 78.0–100.0)
MCV: 89.6 fL (ref 78.0–100.0)
MONO ABS: 0.6 10*3/uL (ref 0.1–1.0)
MONO ABS: 0.5 10*3/uL (ref 0.1–1.0)
Monocytes Relative: 9.5 % (ref 3.0–12.0)
Monocytes Relative: 9 %
NEUTROS ABS: 3.9 10*3/uL (ref 1.4–7.7)
NEUTROS PCT: 67.2 % (ref 43.0–77.0)
NEUTROS PCT: 66 %
Neutro Abs: 3.5 10*3/uL (ref 1.7–7.7)
PLATELETS: 204 10*3/uL (ref 150.0–400.0)
Platelets: 180 10*3/uL (ref 150–400)
RBC: 3.91 Mil/uL (ref 3.87–5.11)
RBC: 3.75 MIL/uL — ABNORMAL LOW (ref 3.87–5.11)
RDW: 14.6 % (ref 11.5–15.5)
RDW: 14.2 % (ref 11.5–15.5)
WBC: 5.9 10*3/uL (ref 4.0–10.5)
WBC: 5.4 10*3/uL (ref 4.0–10.5)

## 2016-02-16 LAB — I-STAT TROPONIN, ED
Troponin i, poc: 0.01 ng/mL (ref 0.00–0.08)
Troponin i, poc: 0.01 ng/mL (ref 0.00–0.08)

## 2016-02-16 LAB — BRAIN NATRIURETIC PEPTIDE
B NATRIURETIC PEPTIDE 5: 280.7 pg/mL — AB (ref 0.0–100.0)
Pro B Natriuretic peptide (BNP): 312 pg/mL — ABNORMAL HIGH (ref 0.0–100.0)

## 2016-02-16 MED ORDER — CEPHALEXIN 500 MG PO CAPS: 500.0000 mg | ORAL_CAPSULE | Freq: Three times a day (TID) | ORAL | Status: DC

## 2016-02-16 MED ORDER — VANCOMYCIN HCL 10 G IV SOLR: 1500.0000 mg | Freq: Once | INTRAVENOUS | Status: DC

## 2016-02-16 MED ORDER — FUROSEMIDE 10 MG/ML IJ SOLN
40.0000 mg | Freq: Once | INTRAMUSCULAR | Status: AC
  Administered 2016-02-16: 40 mg via INTRAVENOUS
  Filled 2016-02-16: qty 4

## 2016-02-16 MED ORDER — VANCOMYCIN HCL IN DEXTROSE 1-5 GM/200ML-% IV SOLN
1000.0000 mg | Freq: Once | INTRAVENOUS | Status: AC
  Administered 2016-02-16: 1000 mg via INTRAVENOUS
  Filled 2016-02-16: qty 200

## 2016-02-16 MED ORDER — SULFAMETHOXAZOLE-TRIMETHOPRIM 800-160 MG PO TABS: 1.0000 | ORAL_TABLET | Freq: Two times a day (BID) | ORAL | Status: AC

## 2016-02-16 NOTE — ED Provider Notes (Signed)
CSN: [id number]     Arrival date & time 02/16/16  1447 History   First MD Initiated Contact with Patient 02/16/16 1540     Chief Complaint  Patient presents with  . Leg Pain     (Consider location/radiation/quality/duration/timing/severity/associated sxs/prior Treatment) HPI Comments: 82 year old female with history of anemia, hypertension, DVT, chronic kidney disease presents for left leg swelling and pain. The patient is a common day by her caregiver. The caregiver and the patient states that over the last 2 weeks the patient has had swelling of the left leg as well as some mild swelling of the right leg. The left leg has become significantly more swollen and red and hot. The patient has also had some increased shortness of breath with exertion. She says she always has some shortness of breath but that this has been increasing recently. She denies any chest pain. No palpitations. Patient was seen in her primary care physician's office today and was told to come to the emergency Department to rule out a DVT or PE.   Past Medical History  Diagnosis Date  . ANEMIA 03/31/2009  . DEGENERATIVE JOINT DISEASE, GENERALIZED 05/06/2007  . GERD 04/24/2007  . HYPERTENSION 04/24/2007  . VENOUS STASIS ULCER 07/20/2008  . DVT (deep venous thrombosis) (HCC)     hx of  . Chronic kidney disease   . Diverticulosis of sigmoid colon     Colonoscopy 2011   Past Surgical History  Procedure Laterality Date  . Cataract extraction    . Abdominal hysterectomy      Completion hysterectomy  . Total knee arthroplasty      x2  . Hernia repair    . Cholecystectomy open    . Partial hysterectomy      Salpingo-oophorectomy as well   No family history on file. Social History  Substance Use Topics  . Smoking status: Never Smoker   . Smokeless tobacco: Never Used  . Alcohol Use: No   OB History    No data available     Review of Systems  Constitutional: Negative for fever and chills.  HENT: Negative for  congestion and voice change.   Eyes: Negative for visual disturbance.  Respiratory: Positive for shortness of breath. Negative for cough, chest tightness and wheezing.   Cardiovascular: Positive for leg swelling. Negative for chest pain and palpitations.  Gastrointestinal: Negative for nausea, vomiting and abdominal pain.  Genitourinary: Negative for dysuria, urgency and hematuria.  Musculoskeletal: Negative for myalgias and back pain.  Skin: Negative for rash.  Neurological: Negative for dizziness, weakness and headaches.  Hematological: Does not bruise/bleed easily.      Allergies  Codeine; Diltiazem hcl; Ibuprofen; and Tramadol  Home Medications   Prior to Admission medications   Medication Sig Start Date End Date Taking? Authorizing Provider  acetaminophen (TYLENOL) 325 MG tablet Take 2 tablets (650 mg total) by mouth every 4 (four) hours as needed for mild pain or moderate pain. 01/02/16  Yes Penny Piarlando Vega, MD  Artificial Tear GEL Place 2 drops into both eyes 2 (two) times daily.   Yes Historical Provider, MD  aspirin EC 81 MG tablet Take 81 mg by mouth every morning.   Yes Historical Provider, MD  Calcium Carbonate-Vitamin D (CALCIUM 600 + D PO) Take 1 tablet by mouth every morning.   Yes Historical Provider, MD  Cyanocobalamin (VITAMIN B-12) 2500 MCG SUBL Place 1 tablet under the tongue every morning.   Yes Historical Provider, MD  feeding supplement, ENSURE ENLIVE, (ENSURE ENLIVE)  LIQD Take 237 mLs by mouth 2 (two) times daily between meals. 01/02/16  Yes Penny Pia, MD  furosemide (LASIX) 40 MG tablet TAKE 1/2 TABLET (=20MG )    DAILY 01/10/16  Yes Gordy Savers, MD  hydrALAZINE (APRESOLINE) 25 MG tablet Take 0.5 tablets (12.5 mg total) by mouth every 8 (eight) hours. 12/26/15  Yes Rhetta Mura, MD  KLOR-CON M20 20 MEQ tablet Take 20 mEq by mouth once.  10/06/15  Yes Historical Provider, MD  meclizine (MEDI-MECLIZINE) 25 MG tablet Take 1 tablet (25 mg total) by mouth 3  (three) times daily as needed for dizziness. 01/10/16  Yes Gordy Savers, MD  Multiple Vitamins-Minerals (PRESERVISION AREDS 2) CAPS Take 1 capsule by mouth daily. 10/06/15  Yes Gordy Savers, MD  nitroGLYCERIN (NITROSTAT) 0.4 MG SL tablet Place 0.4 mg under the tongue every 5 (five) minutes as needed for chest pain.   Yes Historical Provider, MD  omeprazole (PRILOSEC) 40 MG capsule Take 1 capsule (40 mg total) by mouth 2 (two) times daily. Patient taking differently: Take 40 mg by mouth every morning.  10/06/15  Yes Gordy Savers, MD  polyethylene glycol powder (GLYCOLAX/MIRALAX) powder MIX 17GM IN LIQUID AND     DRINK TWO TIMES A DAY 10/06/15  Yes Gordy Savers, MD  quinapril (ACCUPRIL) 40 MG tablet Take 0.5 tablets (20 mg total) by mouth daily. 12/26/15  Yes Rhetta Mura, MD  vitamin E (VITAMIN E) 400 UNIT capsule Take 400 Units by mouth every morning.    Yes Historical Provider, MD  cephALEXin (KEFLEX) 500 MG capsule Take 1 capsule (500 mg total) by mouth 3 (three) times daily. 02/16/16   Leta Baptist, MD  sulfamethoxazole-trimethoprim (BACTRIM DS,SEPTRA DS) 800-160 MG tablet Take 1 tablet by mouth 2 (two) times daily. 02/16/16 02/23/16  Leta Baptist, MD   BP 163/75 mmHg  Pulse 82  Temp(Src) 98.5 F (36.9 C) (Oral)  Resp 16  SpO2 100% Physical Exam  Constitutional: She is oriented to person, place, and time. She appears well-developed and well-nourished. No distress.  HENT:  Head: Normocephalic and atraumatic.  Right Ear: External ear normal.  Left Ear: External ear normal.  Nose: Nose normal.  Mouth/Throat: Oropharynx is clear and moist. No oropharyngeal exudate.  Eyes: EOM are normal. Pupils are equal, round, and reactive to light.  Neck: Normal range of motion. Neck supple.  Cardiovascular: Normal rate, regular rhythm and intact distal pulses.   Pulmonary/Chest: Effort normal. No respiratory distress. She has no wheezes. She has no rales.  Abdominal:  Soft. She exhibits no distension. There is no tenderness.  Musculoskeletal: Normal range of motion. She exhibits edema (ilateral pitting edema, left 2+ right trace/1+). She exhibits no tenderness.  Neurological: She is alert and oriented to person, place, and time.  Skin: Skin is warm and dry. No rash noted. She is not diaphoretic.     Vitals reviewed.   ED Course  Procedures (including critical care time) Labs Review Labs Reviewed  CBC WITH DIFFERENTIAL/PLATELET - Abnormal; Notable for the following:    RBC 3.75 (*)    Hemoglobin 10.9 (*)    HCT 33.6 (*)    All other components within normal limits  COMPREHENSIVE METABOLIC PANEL - Abnormal; Notable for the following:    BUN 37 (*)    Creatinine, Ser 1.58 (*)    Total Protein 6.1 (*)    Albumin 3.0 (*)    ALT 11 (*)    GFR calc non Af Denyse Dago  27 (*)    GFR calc Af Amer 31 (*)    All other components within normal limits  BRAIN NATRIURETIC PEPTIDE - Abnormal; Notable for the following:    B Natriuretic Peptide 280.7 (*)    All other components within normal limits  I-STAT TROPOININ, ED  I-STAT TROPOININ, ED  I-STAT TROPOININ, ED    Imaging Review Dg Chest 2 View  02/16/2016  CLINICAL DATA:  SOB and left leg swelling for 3 weeks now, chest tightness at times EXAM: CHEST  2 VIEW COMPARISON:  01/01/2016 FINDINGS: Heart size upper normal. Vascular pattern normal. No consolidation effusion or edema. Severe bilateral shoulder arthropathy. IMPRESSION: No active cardiopulmonary disease. Electronically Signed   By: Esperanza Heir M.D.   On: 02/16/2016 15:42   I have personally reviewed and evaluated these images and lab results as part of my medical decision-making.   EKG Interpretation   Date/Time:  Thursday Feb 16 2016 15:09:05 EDT Ventricular Rate:  69 PR Interval:    QRS Duration: 84 QT Interval:  394 QTC Calculation: 422 R Axis:   -36 Text Interpretation:  Accelerated Junctional rhythm with occasional  Premature  ventricular complexes Left axis deviation Minimal voltage  criteria for LVH, may be normal variant Possible Anteroseptal infarct ,  age undetermined Abnormal ECG No significant change since last tracing  Confirmed by Tyrone Apple ([id number]) on 02/16/2016 3:41:22 PM      MDM  Patient was seen and evaluated in stable condition. Doppler of the left lower extremity negative for DVT. Physical examination consistent with cellulitis. Patient afebrile without leukocytosis. She has good follow-up with a scheduled appointment with her primary care physician tomorrow for reevaluation. Her caregiver also will help to keep an eye on her. Patient was given 1 dose of IV vancomycin. As far as patient's shortness of breath her BNP was mildly elevated and she did have peripheral edema on her bilateral lower extremities. She was given a dose of Lasix with large urine output and improvement in her shortness of breath. At this time without a DVT F feel that PE is unlikely that this is likely related to fluid build up her mild CHF. Patient was discharged home with prescriptions for Bactrim and Keflex. She was told to take a full pill (40 mg) of her IV Lasix tomorrow rather than half that she usually takes daily. She was instructed to keep her appointment with her primary care physician. Strict return precautions were discussed with the patient and her caregiver who both expressed understanding and agreement. Patient was discharged home in stable condition. Final diagnoses:  Cellulitis of left lower extremity    1. Left leg cellulitis    Leta Baptist, MD 02/16/16 [phone]

## 2016-02-16 NOTE — Patient Instructions (Addendum)
I am worried about a blood clot in your leg. Let's get a STAT ultrasound of your legs  I also would like for you get labs before you leave  AND go to [facility] location for chest x-ray  If ultrasound is normal, may treat for cellulitis and if kidney function ok- likely will increase your lasix  My recommendation is that you go to the emergency room for evaluation but you understand the potential risks and preferred outpatient evaluation

## 2016-02-16 NOTE — ED Notes (Signed)
Meal tray ordered for pt  

## 2016-02-16 NOTE — Progress Notes (Signed)
VASCULAR LAB PRELIMINARY  PRELIMINARY  PRELIMINARY  PRELIMINARY  Left lower extremity venous duplex completed.    Preliminary report:  There is no DVT or SVT noted in the left lower extremity.  Interstitial fluid noted throughout left calf.   Aeden Matranga, RVT 02/16/2016, 4:42 PM

## 2016-02-16 NOTE — Discharge Instructions (Signed)
You were seen and evaluated today for your leg swelling and redness. This is a skin infection no numbness cellulitis. Take antibiotics as prescribed. Follow-up with her primary care physician tomorrow. You are also given an additional dose of your Lasix to help bring down some of the swelling and fluid. Tomorrow morning take 1 pill rather than half a pill and then ask for direction from your primary care physician for further dosing when you see him in the office. Return here for fever, worsening symptoms.  Cellulitis Cellulitis is an infection of the skin and the tissue beneath it. The infected area is usually red and tender. Cellulitis occurs most often in the arms and lower legs.  CAUSES  Cellulitis is caused by bacteria that enter the skin through cracks or cuts in the skin. The most common types of bacteria that cause cellulitis are staphylococci and streptococci. SIGNS AND SYMPTOMS   Redness and warmth.  Swelling.  Tenderness or pain.  Fever. DIAGNOSIS  Your health care provider can usually determine what is wrong based on a physical exam. Blood tests may also be done. TREATMENT  Treatment usually involves taking an antibiotic medicine. HOME CARE INSTRUCTIONS   Take your antibiotic medicine as directed by your health care provider. Finish the antibiotic even if you start to feel better.  Keep the infected arm or leg elevated to reduce swelling.  Apply a warm cloth to the affected area up to 4 times per day to relieve pain.  Take medicines only as directed by your health care provider.  Keep all follow-up visits as directed by your health care provider. SEEK MEDICAL CARE IF:   You notice red streaks coming from the infected area.  Your red area gets larger or turns dark in color.  Your bone or joint underneath the infected area becomes painful after the skin has healed.  Your infection returns in the same area or another area.  You notice a swollen bump in the infected  area.  You develop new symptoms.  You have a fever. SEEK IMMEDIATE MEDICAL CARE IF:   You feel very sleepy.  You develop vomiting or diarrhea.  You have a general ill feeling (malaise) with muscle aches and pains.   This information is not intended to replace advice given to you by your health care provider. Make sure you discuss any questions you have with your health care provider.   Document Released: 06/20/2005 Document Revised: 06/01/2015 Document Reviewed: 11/26/2011 Elsevier Interactive Patient Education [employer]2016 [employer]

## 2016-02-16 NOTE — ED Notes (Addendum)
She c/o 2 week history of LLE pain and swelling and SOB. She denies any injuries. Her PCP sent her to rule out DVT/PE. She is alert, tachypneic

## 2016-02-16 NOTE — Progress Notes (Signed)
Subjective:  Erin Gibson is a 82 y.o. year old very pleasant female patient who presents for/with See problem oriented charting ROS- complains of shortness of breath for a day, edema in left leg more pronounced over last 1-2 weeks, 9 lbs weight gain over last month, decreased urination.see any ROS included in HPI as well.   Past Medical History-  Hypertension History of venous stasis ulceration CKD Stage III  Medications- reviewed and updated Current Outpatient Prescriptions  Medication Sig Dispense Refill  . acetaminophen (TYLENOL) 325 MG tablet Take 2 tablets (650 mg total) by mouth every 4 (four) hours as needed for mild pain or moderate pain. 30 tablet 0  . Artificial Tear GEL Place 2 drops into both eyes 2 (two) times daily.    Marland Kitchen aspirin EC 81 MG tablet Take 81 mg by mouth every morning.    . Calcium Carbonate-Vitamin D (CALCIUM 600 + D PO) Take 1 tablet by mouth every morning.    . Cyanocobalamin (VITAMIN B-12) 2500 MCG SUBL Place 1 tablet under the tongue every morning.    . feeding supplement, ENSURE ENLIVE, (ENSURE ENLIVE) LIQD Take 237 mLs by mouth 2 (two) times daily between meals. 237 mL 12  . furosemide (LASIX) 40 MG tablet TAKE 1/2 TABLET (=20MG )    DAILY 45 tablet 3  . hydrALAZINE (APRESOLINE) 25 MG tablet Take 0.5 tablets (12.5 mg total) by mouth every 8 (eight) hours. 90 tablet 1  . KLOR-CON M20 20 MEQ tablet Take 20 mEq by mouth once.     . meclizine (MEDI-MECLIZINE) 25 MG tablet Take 1 tablet (25 mg total) by mouth 3 (three) times daily as needed for dizziness. 90 tablet 1  . Multiple Vitamins-Minerals (PRESERVISION AREDS 2) CAPS Take 1 capsule by mouth daily. 90 capsule 3  . nitroGLYCERIN (NITROSTAT) 0.4 MG SL tablet Place 0.4 mg under the tongue every 5 (five) minutes as needed for chest pain.    Marland Kitchen omeprazole (PRILOSEC) 40 MG capsule Take 1 capsule (40 mg total) by mouth 2 (two) times daily. (Patient taking differently: Take 40 mg by mouth every morning. ) 180 capsule  1  . polyethylene glycol powder (GLYCOLAX/MIRALAX) powder MIX 17GM IN LIQUID AND     DRINK TWO TIMES A DAY 3162 g 3  . quinapril (ACCUPRIL) 40 MG tablet Take 0.5 tablets (20 mg total) by mouth daily. 90 tablet 1  . vitamin E (VITAMIN E) 400 UNIT capsule Take 400 Units by mouth every morning.      No current facility-administered medications for this visit.    Objective: BP 182/99 mmHg  Pulse 73  Temp(Src) 98.1 F (36.7 C) (Oral)  Wt 113 lb (51.256 kg)  SpO2 97% Gen: NAD, resting comfortably, frail and elderly Oropharynx normal CV: RRR no murmurs rubs or gallops Lungs: CTAB no crackles, wheeze, rhonchi Abdomen: soft/nontender/nondistended/normal bowel sounds. No rebound or guarding.  Ext: trace edema right, 1+ on left  Neuro: grossly normal, moves all extremities  As always, erythema of leg more pronounced in person. 10 cm below tibial plateau measured 30 cm on right and 33 cm on left.      Assessment/Plan:   Dyspnea on exertion (new) Left Lower Leg edema Hypertension S: Patient states she has had left leg swelling > right slightly for some time but over last 1-2 weeks has noted pronounced swelling in the left leg. She complains of calf pain mild to moderate with this. The skin also feels tight over the leg. She has been compliant  with compression stockings but issues persist despite this. She states swelling was above her knee as wellNo fever, chills, nausea, or vomiting.   She noted over the last day that she has been short of breath even with minimal activity though none at rest. No cough.   A/P: Patient initially very resistant to going to the hospital- my concern was for DVT/PE leading to swelling in leg and then DOE- honestly the leg appeared more to be caused by cellulitis but DVT/PE so high risk. Plan initially was for stat venous duplex, stat labs including BNP, cbc, cmp and chest x-ray. If no dvt, plan likely to increase lasix and start keflex. With SOB and BP, also  concerned about hypertensive urgency/emergency. She complained of decreased urination, increasing weight, and SOB and my concern was fluid overload as well. Pro bnp came back equivocal - would likely be diastolic given recent echo if was CHF, unless ACS leading to poor function.   When patient returned from lab she was very shaky (history of hypoglycemia) and admitted to extreme anxiety from needing to have such extensive workup. She ultimately decided would be easier to have issues addressed in ED so she proceeded there.   Scheduled follow up for tomorrow- but may cancel depending on findings in ER.  Return precautions advised.   Orders Placed This Encounter  Procedures  . DG Chest 2 View    Standing Status: Future     Number of Occurrences:      Standing Expiration Date: 04/17/2017    Order Specific Question:  Reason for Exam (SYMPTOM  OR DIAGNOSIS REQUIRED)    Answer:  DOE    Order Specific Question:  Preferred imaging location?    Answer:  [address]  . Brain Natriuretic Peptide  . CBC with Differential/Platelet  . Comprehensive metabolic panel    [place]    No orders of the defined types were placed in this encounter.    Tana ConchStephen Hunter, MD

## 2016-02-17 ENCOUNTER — Ambulatory Visit: Payer: Medicare Other | Admitting: Family Medicine

## 2016-02-27 ENCOUNTER — Encounter: Payer: Self-pay | Admitting: Internal Medicine

## 2016-02-27 ENCOUNTER — Ambulatory Visit (INDEPENDENT_AMBULATORY_CARE_PROVIDER_SITE_OTHER): Payer: Medicare Other | Admitting: Internal Medicine

## 2016-02-27 VITALS — BP 110/50 | HR 74 | Temp 97.7°F | Resp 18 | Ht 59.0 in | Wt 107.0 lb

## 2016-02-27 DIAGNOSIS — N183 Chronic kidney disease, stage 3 unspecified: Secondary | ICD-10-CM

## 2016-02-27 DIAGNOSIS — E875 Hyperkalemia: Secondary | ICD-10-CM | POA: Diagnosis not present

## 2016-02-27 DIAGNOSIS — I10 Essential (primary) hypertension: Secondary | ICD-10-CM | POA: Diagnosis not present

## 2016-02-27 DIAGNOSIS — D649 Anemia, unspecified: Secondary | ICD-10-CM | POA: Diagnosis not present

## 2016-02-27 DIAGNOSIS — M159 Polyosteoarthritis, unspecified: Secondary | ICD-10-CM

## 2016-02-27 DIAGNOSIS — M150 Primary generalized (osteo)arthritis: Secondary | ICD-10-CM

## 2016-02-27 NOTE — Progress Notes (Signed)
Subjective:    Patient ID: Erin Gibson, female    DOB: 12/01/1918, 82 y.o.   MRN: [id number]  HPI   02/16/16.  Patient was seen and evaluated in stable condition. Doppler of the left lower extremity negative for DVT. Physical examination consistent with cellulitis. Patient afebrile without leukocytosis. She has good follow-up with a scheduled appointment with her primary care physician tomorrow for reevaluation. Her caregiver also will help to keep an eye on her. Patient was given 1 dose of IV vancomycin. As far as patient's shortness of breath her BNP was mildly elevated and she did have peripheral edema on her bilateral lower extremities. She was given a dose of Lasix with large urine output and improvement in her shortness of breath. At this time without a DVT F feel that PE is unlikely that this is likely related to fluid build up her mild CHF. Patient was discharged home with prescriptions for Bactrim and Keflex. She was told to take a full pill (40 mg) of her IV Lasix tomorrow rather than half that she usually takes daily. She was instructed to keep her appointment with her primary care physician. Strict return precautions were discussed with the patient and her caregiver who both expressed understanding and agreement. Patient was discharged home in stable condition. Final diagnoses:  Cellulitis of left lower extremity    1. Left leg cellulitis     02/27/16.  82 year old patient who is seen today following a recent ED visit approximate 10 days ago.  She was sent to the ED to rule out left leg DVT.  Venous Doppler study was negative and the patient has recently completed dual antibiotic therapy with cephalexin and sulfa.  She still has some swelling involving the left leg but it is much improved.  She generally feels well She uses a walker but continues to have falls.  Fortunately, she does have a caregiver for 24-hour assistance.  She does have chronic venous insufficiency with history of  venous stasis ulceration in the past She has a history of essential hypertension and blood pressure very well controlled today at 110 over 50.  Past Medical History  Diagnosis Date  . ANEMIA 03/31/2009  . DEGENERATIVE JOINT DISEASE, GENERALIZED 05/06/2007  . GERD 04/24/2007  . HYPERTENSION 04/24/2007  . VENOUS STASIS ULCER 07/20/2008  . DVT (deep venous thrombosis) (HCC)     hx of  . Chronic kidney disease   . Diverticulosis of sigmoid colon     Colonoscopy 2011     Social History   Social History  . Marital Status: Widowed    Spouse Name: N/A  . Number of Children: N/A  . Years of Education: N/A   Occupational History  . Not on file.   Social History Main Topics  . Smoking status: Never Smoker   . Smokeless tobacco: Never Used  . Alcohol Use: No  . Drug Use: No  . Sexual Activity: Not on file   Other Topics Concern  . Not on file   Social History Narrative    Past Surgical History  Procedure Laterality Date  . Cataract extraction    . Abdominal hysterectomy      Completion hysterectomy  . Total knee arthroplasty      x2  . Hernia repair    . Cholecystectomy open    . Partial hysterectomy      Salpingo-oophorectomy as well    No family history on file.  Allergies  Allergen Reactions  . Codeine Nausea  And Vomiting    Violently ill  . Diltiazem Hcl Other (See Comments)    Not sure about this reaction  . Ibuprofen Nausea And Vomiting  . Tramadol     Auditory and visual hallucinations    Current Outpatient Prescriptions on File Prior to Visit  Medication Sig Dispense Refill  . acetaminophen (TYLENOL) 325 MG tablet Take 2 tablets (650 mg total) by mouth every 4 (four) hours as needed for mild pain or moderate pain. 30 tablet 0  . Artificial Tear GEL Place 2 drops into both eyes 2 (two) times daily.    Marland Kitchen aspirin EC 81 MG tablet Take 81 mg by mouth every morning.    . Calcium Carbonate-Vitamin D (CALCIUM 600 + D PO) Take 1 tablet by mouth every morning.      . Cyanocobalamin (VITAMIN B-12) 2500 MCG SUBL Place 1 tablet under the tongue every morning.    . feeding supplement, ENSURE ENLIVE, (ENSURE ENLIVE) LIQD Take 237 mLs by mouth 2 (two) times daily between meals. 237 mL 12  . furosemide (LASIX) 40 MG tablet TAKE 1/2 TABLET (=20MG )    DAILY 45 tablet 3  . hydrALAZINE (APRESOLINE) 25 MG tablet Take 0.5 tablets (12.5 mg total) by mouth every 8 (eight) hours. 90 tablet 1  . KLOR-CON M20 20 MEQ tablet Take 20 mEq by mouth once.     . meclizine (MEDI-MECLIZINE) 25 MG tablet Take 1 tablet (25 mg total) by mouth 3 (three) times daily as needed for dizziness. 90 tablet 1  . Multiple Vitamins-Minerals (PRESERVISION AREDS 2) CAPS Take 1 capsule by mouth daily. 90 capsule 3  . nitroGLYCERIN (NITROSTAT) 0.4 MG SL tablet Place 0.4 mg under the tongue every 5 (five) minutes as needed for chest pain.    Marland Kitchen omeprazole (PRILOSEC) 40 MG capsule Take 1 capsule (40 mg total) by mouth 2 (two) times daily. (Patient taking differently: Take 40 mg by mouth every morning. ) 180 capsule 1  . polyethylene glycol powder (GLYCOLAX/MIRALAX) powder MIX 17GM IN LIQUID AND     DRINK TWO TIMES A DAY 3162 g 3  . quinapril (ACCUPRIL) 40 MG tablet Take 0.5 tablets (20 mg total) by mouth daily. 90 tablet 1  . vitamin E (VITAMIN E) 400 UNIT capsule Take 400 Units by mouth every morning.      No current facility-administered medications on file prior to visit.    BP 110/50 mmHg  Pulse 74  Temp(Src) 97.7 F (36.5 C) (Oral)  Resp 18  Ht  (1.499 m)  Wt 107 lb (48.535 kg)  BMI 21.60 kg/m2  SpO2 98%      Review of Systems  Constitutional: Negative.   HENT: Negative for congestion, dental problem, hearing loss, rhinorrhea, sinus pressure, sore throat and tinnitus.   Eyes: Negative for pain, discharge and visual disturbance.  Respiratory: Negative for cough and shortness of breath.   Cardiovascular: Positive for leg swelling. Negative for chest pain and palpitations.   Gastrointestinal: Negative for nausea, vomiting, abdominal pain, diarrhea, constipation, blood in stool and abdominal distention.  Genitourinary: Negative for dysuria, urgency, frequency, hematuria, flank pain, vaginal bleeding, vaginal discharge, difficulty urinating, vaginal pain and pelvic pain.  Musculoskeletal: Positive for back pain, arthralgias and gait problem. Negative for joint swelling.  Skin: Negative for rash.  Neurological: Negative for dizziness, syncope, speech difficulty, weakness, numbness and headaches.  Hematological: Negative for adenopathy.  Psychiatric/Behavioral: Negative for behavioral problems, dysphoric mood and agitation. The patient is not nervous/anxious.  Objective:   Physical Exam  Constitutional: She is oriented to person, place, and time. She appears well-developed and well-nourished. No distress.  Elderly, frail No distress Afebrile Blood pressure low normal O2 saturation 98%  HENT:  Head: Normocephalic.  Right Ear: External ear normal.  Left Ear: External ear normal.  Mouth/Throat: Oropharynx is clear and moist.  Eyes: Conjunctivae and EOM are normal. Pupils are equal, round, and reactive to light.  Neck: Normal range of motion. Neck supple. No thyromegaly present.  Cardiovascular: Normal rate, regular rhythm and intact distal pulses.   Murmur heard. Grade 3/6 harsh systolic murmur loudest at the primary aortic area  Pulmonary/Chest: Effort normal and breath sounds normal.  Marked kyphoscoliosis  Abdominal: Soft. Bowel sounds are normal. She exhibits no mass. There is no tenderness.  Musculoskeletal: Normal range of motion. She exhibits edema.  Persistent swelling and induration involving primarily the left calf area swelling does extend to the ankle but spares the foot  Lymphadenopathy:    She has no cervical adenopathy.  Neurological: She is alert and oriented to person, place, and time.  Skin: Skin is warm and dry. No rash noted.   Psychiatric: She has a normal mood and affect. Her behavior is normal.          Assessment & Plan:   Chronic venous insufficiency Status post cellulitis.  We'll continue to observe off further antibiotics.  We'll continue low-salt diet and attempt to keep the left leg elevated Essential hypertension, stable History of anemia.  Stable Chronic kidney disease, stable  Follow-up 4 months or as needed  Rogelia Boga, MD \

## 2016-02-27 NOTE — Patient Instructions (Signed)
Limit your sodium (Salt) intake  Keep legs elevated as much as possible  Please report any worsening leg swelling, redness or pain or if you develop any fever  Return in 4 months for follow-up or as needed

## 2016-02-27 NOTE — Progress Notes (Signed)
Pre visit review using our clinic review tool, if applicable. No additional management support is needed unless otherwise documented below in the visit note. 

## 2016-03-12 DIAGNOSIS — S0101XA Laceration without foreign body of scalp, initial encounter: Secondary | ICD-10-CM | POA: Diagnosis not present

## 2016-03-12 DIAGNOSIS — I951 Orthostatic hypotension: Secondary | ICD-10-CM | POA: Diagnosis not present

## 2016-03-12 DIAGNOSIS — I129 Hypertensive chronic kidney disease with stage 1 through stage 4 chronic kidney disease, or unspecified chronic kidney disease: Secondary | ICD-10-CM | POA: Diagnosis not present

## 2016-03-30 ENCOUNTER — Telehealth: Payer: Self-pay | Admitting: Internal Medicine

## 2016-03-30 NOTE — Telephone Encounter (Signed)
Please call in a prescription for cephalexin 500 mg #40 one 4 times a day Keep leg elevated as much as possible  Notify patient that she must be evaluated in the emergency room if there is any fever, chills, or clinical worsening

## 2016-03-30 NOTE — Telephone Encounter (Signed)
Pt states dr Kirtland Bouchardk advised to call back if her leg gets redder, and it has,. Pt states calf (leg) is very swollen and pt thinks it is going into the other leg.  Pt states it really hurts at night.  Pt has no way to get here today.  Please advise

## 2016-03-30 NOTE — Telephone Encounter (Signed)
Please see message and advise 

## 2016-04-02 MED ORDER — CEPHALEXIN 500 MG PO CAPS: 500.0000 mg | ORAL_CAPSULE | Freq: Four times a day (QID) | ORAL | Status: DC

## 2016-04-02 NOTE — Telephone Encounter (Signed)
Spoke to pt, told her Dr.K wants her to start Cephalexin 500 mg one tablet 4 times a day for 10 days and keep leg elevated as much as possible. Pt verbalized understanding. Told pt she must be evaluated in the emergency room if there is any fever, chills or clinical worsening symptoms per Dr.K. Pt verbalized understanding. Rx sent to pharmacy.

## 2016-04-13 ENCOUNTER — Ambulatory Visit (INDEPENDENT_AMBULATORY_CARE_PROVIDER_SITE_OTHER): Payer: Medicare Other | Admitting: Family Medicine

## 2016-04-13 ENCOUNTER — Telehealth: Payer: Self-pay | Admitting: Internal Medicine

## 2016-04-13 VITALS — BP 122/70 | HR 69 | Temp 98.0°F

## 2016-04-13 DIAGNOSIS — I8312 Varicose veins of left lower extremity with inflammation: Secondary | ICD-10-CM

## 2016-04-13 DIAGNOSIS — I8311 Varicose veins of right lower extremity with inflammation: Secondary | ICD-10-CM | POA: Diagnosis not present

## 2016-04-13 DIAGNOSIS — L03116 Cellulitis of left lower limb: Secondary | ICD-10-CM | POA: Diagnosis not present

## 2016-04-13 DIAGNOSIS — R600 Localized edema: Secondary | ICD-10-CM | POA: Diagnosis not present

## 2016-04-13 DIAGNOSIS — I872 Venous insufficiency (chronic) (peripheral): Secondary | ICD-10-CM

## 2016-04-13 MED ORDER — CEPHALEXIN 500 MG PO CAPS: 500.0000 mg | ORAL_CAPSULE | Freq: Two times a day (BID) | ORAL | Status: DC

## 2016-04-13 MED ORDER — SULFAMETHOXAZOLE-TRIMETHOPRIM 400-80 MG PO TABS: 1.0000 | ORAL_TABLET | Freq: Two times a day (BID) | ORAL | Status: DC

## 2016-04-13 NOTE — Telephone Encounter (Signed)
Patient was seen today by Dr.Kim.

## 2016-04-13 NOTE — Progress Notes (Signed)
Pre visit review using our clinic review tool, if applicable. No additional management support is needed unless otherwise documented below in the visit note. 

## 2016-04-13 NOTE — Patient Instructions (Signed)
BEFORE YOU LEAVE: -follow up: with your doctor in 5-7 days  Increase lasix to 40 mg once daily in the morning for 3 days  Elevate legs above waist for 30 minutes twice daily  Take the antibiotics as instructed.  Seek emergency care immediately if fevers, feeling sick, sudden worsening or other concerns.

## 2016-04-13 NOTE — Progress Notes (Signed)
HPI:   Erin Gibson is a pleasant 82 yo whom per brief review of chart has a history significant for venous insufficiency, lower ext edema, venous stasis dermatitis and ulcer and intermittent cellulitis treated in hospital and with antibiotics several times in the last few months. Here today for persitent LE erythema, swelling bilaterally. Takes lasix  daily. Not wearing her compression socks and not elevating. 10 day course kefex rd by PCP 03/30/16. She wants more of this as he thinks it helped but is frustrated it did not resolve the redness. Has nurse assistant that lives with her and takes care of her. She is present for visit. No wounds, weeping, fevers, malaise.  ROS: See pertinent positives and negatives per HPI.  Past Medical History  Diagnosis Date  . ANEMIA 03/31/2009  . DEGENERATIVE JOINT DISEASE, GENERALIZED 05/06/2007  . GERD 04/24/2007  . HYPERTENSION 04/24/2007  . VENOUS STASIS ULCER 07/20/2008  . DVT (deep venous thrombosis) (HCC)     hx of  . Chronic kidney disease   . Diverticulosis of sigmoid colon     Colonoscopy 2011    Past Surgical History  Procedure Laterality Date  . Cataract extraction    . Abdominal hysterectomy      Completion hysterectomy  . Total knee arthroplasty      x2  . Hernia repair    . Cholecystectomy open    . Partial hysterectomy      Salpingo-oophorectomy as well    No family history on file.  Social History   Social History  . Marital Status: Widowed    Spouse Name: N/A  . Number of Children: N/A  . Years of Education: N/A   Social History Main Topics  . Smoking status: Never Smoker   . Smokeless tobacco: Never Used  . Alcohol Use: No  . Drug Use: No  . Sexual Activity: Not on file   Other Topics Concern  . Not on file   Social History Narrative     Current outpatient prescriptions:  .  acetaminophen (TYLENOL) 325 MG tablet, Take 2 tablets (650 mg total) by mouth every 4 (four) hours as needed for mild pain or  moderate pain., Disp: 30 tablet, Rfl: 0 .  Artificial Tear GEL, Place 2 drops into both eyes 2 (two) times daily., Disp: , Rfl:  .  aspirin EC 81 MG tablet, Take 81 mg by mouth every morning., Disp: , Rfl:  .  Cyanocobalamin (VITAMIN B-12) 2500 MCG SUBL, Place 1 tablet under the tongue every morning., Disp: , Rfl:  .  feeding supplement, ENSURE ENLIVE, (ENSURE ENLIVE) LIQD, Take 237 mLs by mouth 2 (two) times daily between meals., Disp: 237 mL, Rfl: 12 .  furosemide (LASIX) 40 MG tablet, TAKE 1/2 TABLET (=20MG )    DAILY, Disp: 45 tablet, Rfl: 3 .  hydrALAZINE (APRESOLINE) 25 MG tablet, Take 0.5 tablets (12.5 mg total) by mouth every 8 (eight) hours., Disp: 90 tablet, Rfl: 1 .  Multiple Vitamins-Minerals (PRESERVISION AREDS 2) CAPS, Take 1 capsule by mouth daily., Disp: 90 capsule, Rfl: 3 .  nitroGLYCERIN (NITROSTAT) 0.4 MG SL tablet, Place 0.4 mg under the tongue every 5 (five) minutes as needed for chest pain., Disp: , Rfl:  .  polyethylene glycol powder (GLYCOLAX/MIRALAX) powder, MIX 17GM IN LIQUID AND     DRINK TWO TIMES A DAY, Disp: 3162 g, Rfl: 3 .  quinapril (ACCUPRIL) 40 MG tablet, Take 0.5 tablets (20 mg total) by mouth daily., Disp: 90 tablet, Rfl: 1 .  vitamin E (VITAMIN E) 400 UNIT capsule, Take 400 Units by mouth every morning. , Disp: , Rfl:  .  cephALEXin (KEFLEX) 500 MG capsule, Take 1 capsule (500 mg total) by mouth 2 (two) times daily., Disp: 10 capsule, Rfl: 0 .  sulfamethoxazole-trimethoprim (BACTRIM,SEPTRA) 400-80 MG tablet, Take 1 tablet by mouth 2 (two) times daily., Disp: 10 tablet, Rfl: 0  EXAM:  Filed Vitals:   04/13/16 1301  BP: 122/70  Pulse: 69  Temp: 98 F (36.7 C)    There is no weight on file to calculate BMI.  GENERAL: vitals reviewed and listed above, alert, oriented, appears well hydrated and in no acute distress  HEENT: atraumatic, conjunttiva clear, no obvious abnormalities on inspection of external nose and ears  NECK: no obvious masses on  inspection  LUNGS: clear to auscultation bilaterally, no wheezes, rales or rhonchi, good air movement  CV: HRRR, bilateral ankle and lower leg edema with venous stasis dermatitis and thickening of skin with mild erythema   SKIN: see above, mild generalized erythema and mild warmth of the bilateral lower extremities in the lower 1/3 calf/ankle and top of foot with L>R; some thickening and mild scaling of the skin  MS: moves all extremities without noticeable abnormality, scoliosis  PSYCH: pleasant and cooperative, no obvious depression or anxiety  ASSESSMENT AND PLAN:  Discussed the following assessment and plan:  Venous stasis dermatitis of both lower extremities  Bilateral edema of lower extremity  Cellulitis of left lower extremity  -I think this is mainly venous stasis dermatitis with chronic skin changes due to swelling, given the warmth and pt concerns/hx will do short course abx incase mild cellulitis or erysipelas, mild if so without any systemic symptoms -stressed importance of elevation, getting fluid off legs - increase lasix dose to 40mg  x 3 days -close follow up to ensure improving -Patient advised to return or notify a doctor immediately if symptoms worsen or persist or new concerns arise.  Patient Instructions  BEFORE YOU LEAVE: -follow up: with your doctor in 5-7 days  Increase lasix to 40 mg once daily in the morning for 3 days  Elevate legs above waist for 30 minutes twice daily  Take the antibiotics as instructed.  Seek emergency care immediately if fevers, feeling sick, sudden worsening or other concerns.    Kriste BasqueKIM, HANNAH R., DO

## 2016-04-13 NOTE — Telephone Encounter (Signed)
Patient Name: Erin Gibson  DOB: 11/02/1918    Initial Comment Caller states her mother's cellulites is worse. Red, swollen, hard per caregiver. She is c/o joint pain.    Nurse Assessment  Nurse: Sherilyn CooterHenry, RN, Thurmond ButtsWade Date/Time Lamount Cohen(Eastern Time): 04/13/2016 8:48:23 AM  Confirm and document reason for call. If symptomatic, describe symptoms. You must click the next button to save text entered. ---Caller states that her mother's cellulitis is worse. She is on her second round of antibiotics. She is not with her mother. 3 way call offered. She can be reached at [phone]. She states that she has had cellulitis in her left leg for several weeks and it has not spread to the right leg. She rates her pain as "not bad." She states it looks bad. She denies fever. There is swelling of the legs. The left leg swelling goes above her knee.  Has the patient traveled out of the country within the last 30 days? ---Not Applicable  Does the patient have any new or worsening symptoms? ---Yes  Will a triage be completed? ---Yes  Related visit to physician within the last 2 weeks? ---Yes  Does the PT have any chronic conditions? (i.e. diabetes, asthma, etc.) ---Yes  List chronic conditions. ---Arthritis, Kidney problems,  Is this a behavioral health or substance abuse call? ---No     Guidelines    Guideline Title Affirmed Question Affirmed Notes  Leg Swelling and Edema [1] Difficulty breathing with exertion (e.g., walking) AND [2] new onset or worsening    Final Disposition User   Go to ED Now (or PCP triage) Sherilyn CooterHenry, RN, Wade    Comments  Dr. Frederica KusterKwaitkowski is not in the office today. I was able to schedule her to be seen by Dr. Kriste BasqueHannah Kim at 1:00pm today.   Referrals  REFERRED TO PCP OFFICE   Disagree/Comply: Comply

## 2016-04-13 NOTE — Telephone Encounter (Signed)
Pt is seeing Dr. Selena BattenKim today at 1pm.

## 2016-04-20 ENCOUNTER — Encounter: Payer: Self-pay | Admitting: Internal Medicine

## 2016-04-20 ENCOUNTER — Ambulatory Visit (INDEPENDENT_AMBULATORY_CARE_PROVIDER_SITE_OTHER): Payer: Medicare Other | Admitting: Internal Medicine

## 2016-04-20 VITALS — BP 140/70 | HR 70 | Temp 98.5°F | Ht 59.0 in | Wt 112.0 lb

## 2016-04-20 DIAGNOSIS — I872 Venous insufficiency (chronic) (peripheral): Secondary | ICD-10-CM | POA: Insufficient documentation

## 2016-04-20 DIAGNOSIS — N183 Chronic kidney disease, stage 3 unspecified: Secondary | ICD-10-CM

## 2016-04-20 DIAGNOSIS — I10 Essential (primary) hypertension: Secondary | ICD-10-CM

## 2016-04-20 MED ORDER — NITROGLYCERIN 0.4 MG SL SUBL: 0.4000 mg | SUBLINGUAL_TABLET | SUBLINGUAL | 2 refills | Status: AC | PRN

## 2016-04-20 NOTE — Progress Notes (Signed)
Pre visit review using our clinic review tool, if applicable. No additional management support is needed unless otherwise documented below in the visit note. 

## 2016-04-20 NOTE — Progress Notes (Signed)
Subjective:    Patient ID: Erin Gibson, female    DOB: Jun 05, 1919, 82 y.o.   MRN: [id number]  HPI 82 year old patient who has a long history of chronic venous insufficiency.  She was seen last week and is on antibiotic therapy for increasing swelling and redness involving her left lower leg. She is on diuretic therapy in attempts to keep her legs elevated.  She uses compression hose sporadically but due to the increasing swelling and has had a more difficult time using the compression stockings.  No shortness of breath or pulmonary complaints She has had venous stasis ulceration in the past  She was seen in the ED in May for lower extremity swelling. A lower extremity Doppler study was performed with no evidence of DVT Past Medical History:  Diagnosis Date  . ANEMIA 03/31/2009  . Chronic kidney disease   . DEGENERATIVE JOINT DISEASE, GENERALIZED 05/06/2007  . Diverticulosis of sigmoid colon    Colonoscopy 2011  . DVT (deep venous thrombosis) (HCC)    hx of  . GERD 04/24/2007  . HYPERTENSION 04/24/2007  . VENOUS STASIS ULCER 07/20/2008     Social History   Social History  . Marital status: Widowed    Spouse name: N/A  . Number of children: N/A  . Years of education: N/A   Occupational History  . Not on file.   Social History Main Topics  . Smoking status: Never Smoker  . Smokeless tobacco: Never Used  . Alcohol use No  . Drug use: No  . Sexual activity: Not on file   Other Topics Concern  . Not on file   Social History Narrative  . No narrative on file    Past Surgical History:  Procedure Laterality Date  . ABDOMINAL HYSTERECTOMY     Completion hysterectomy  . CATARACT EXTRACTION    . CHOLECYSTECTOMY OPEN    . HERNIA REPAIR    . PARTIAL HYSTERECTOMY     Salpingo-oophorectomy as well  . TOTAL KNEE ARTHROPLASTY     x2    No family history on file.  Allergies  Allergen Reactions  . Codeine Nausea And Vomiting    Violently ill  . Diltiazem Hcl Other (See  Comments)    Not sure about this reaction  . Ibuprofen Nausea And Vomiting  . Tramadol     Auditory and visual hallucinations    Current Outpatient Prescriptions on File Prior to Visit  Medication Sig Dispense Refill  . acetaminophen (TYLENOL) 325 MG tablet Take 2 tablets (650 mg total) by mouth every 4 (four) hours as needed for mild pain or moderate pain. 30 tablet 0  . Artificial Tear GEL Place 2 drops into both eyes 2 (two) times daily.    Marland Kitchen aspirin EC 81 MG tablet Take 81 mg by mouth every morning.    . Cyanocobalamin (VITAMIN B-12) 2500 MCG SUBL Place 1 tablet under the tongue every morning.    . feeding supplement, ENSURE ENLIVE, (ENSURE ENLIVE) LIQD Take 237 mLs by mouth 2 (two) times daily between meals. 237 mL 12  . furosemide (LASIX) 40 MG tablet TAKE 1/2 TABLET (=20MG )    DAILY 45 tablet 3  . hydrALAZINE (APRESOLINE) 25 MG tablet Take 0.5 tablets (12.5 mg total) by mouth every 8 (eight) hours. 90 tablet 1  . Multiple Vitamins-Minerals (PRESERVISION AREDS 2) CAPS Take 1 capsule by mouth daily. 90 capsule 3  . polyethylene glycol powder (GLYCOLAX/MIRALAX) powder MIX 17GM IN LIQUID AND  DRINK TWO TIMES A DAY 3162 g 3  . quinapril (ACCUPRIL) 40 MG tablet Take 0.5 tablets (20 mg total) by mouth daily. 90 tablet 1  . vitamin E (VITAMIN E) 400 UNIT capsule Take 400 Units by mouth every morning.      No current facility-administered medications on file prior to visit.     BP 140/70 (BP Location: Left Arm, Patient Position: Sitting, Cuff Size: Normal)   Pulse 70   Temp 98.5 F (36.9 C) (Oral)   Ht 4\' 11"  (1.499 m)   Wt 112 lb (50.8 kg)   SpO2 97%   BMI 22.62 kg/m      Review of Systems  Constitutional: Positive for fatigue.  HENT: Negative for congestion, dental problem, hearing loss, rhinorrhea, sinus pressure, sore throat and tinnitus.   Eyes: Negative for pain, discharge and visual disturbance.  Respiratory: Negative for cough and shortness of breath.     Cardiovascular: Positive for leg swelling. Negative for chest pain and palpitations.  Gastrointestinal: Negative for abdominal distention, abdominal pain, blood in stool, constipation, diarrhea, nausea and vomiting.  Genitourinary: Negative for difficulty urinating, dysuria, flank pain, frequency, hematuria, pelvic pain, urgency, vaginal bleeding, vaginal discharge and vaginal pain.  Musculoskeletal: Negative for arthralgias, gait problem and joint swelling.  Skin: Negative for rash.  Neurological: Positive for weakness. Negative for dizziness, syncope, speech difficulty, numbness and headaches.  Hematological: Negative for adenopathy.  Psychiatric/Behavioral: Negative for agitation, behavioral problems and dysphoric mood. The patient is not nervous/anxious.        Objective:   Physical Exam  Constitutional: She is oriented to person, place, and time. She appears well-developed and well-nourished.  HENT:  Head: Normocephalic.  Right Ear: External ear normal.  Left Ear: External ear normal.  Mouth/Throat: Oropharynx is clear and moist.  Eyes: Conjunctivae and EOM are normal. Pupils are equal, round, and reactive to light.  Neck: Normal range of motion. Neck supple. No thyromegaly present.  Cardiovascular: Normal rate, regular rhythm, normal heart sounds and intact distal pulses.   Pulmonary/Chest: Effort normal and breath sounds normal. No respiratory distress. She has no wheezes. She has no rales.  Marked kyphosis  Abdominal: Soft. Bowel sounds are normal. She exhibits no mass. There is no tenderness.  Musculoskeletal: Normal range of motion. She exhibits edema.  Lower extremity edema.  Most concentrated in the calf area.  Edema was pitting.  Edema largely spared.  The ankles and feet  Lymphadenopathy:    She has no cervical adenopathy.  Neurological: She is alert and oriented to person, place, and time.  Skin: Skin is warm and dry. No rash noted.  Psychiatric: She has a normal mood  and affect. Her behavior is normal.          Assessment & Plan:     Chronic venous insufficiency.  Will increase furosemide to 40 mg daily.  Low salt diet stressed as well as elevation and use of her compression hose.  Recheck 4 weeks with electrolytes , hypertension , osteoarthritis   Rogelia Boga, MD

## 2016-04-20 NOTE — Patient Instructions (Addendum)
Increase furosemide to 40 mg daily  Limit your sodium (Salt) intake  Keep legs elevated as much as possible  Use support/compression hose   Venous Stasis or Chronic Venous Insufficiency Chronic venous insufficiency, also called venous stasis, is a condition that affects the veins in the legs. The condition prevents blood from being pumped through these veins effectively. Blood may no longer be pumped effectively from the legs back to the heart. This condition can range from mild to severe. With proper treatment, you should be able to continue with an active life. CAUSES  Chronic venous insufficiency occurs when the vein walls become stretched, weakened, or damaged or when valves within the vein are damaged. Some common causes of this include:  High blood pressure inside the veins (venous hypertension).  Increased blood pressure in the leg veins from long periods of sitting or standing.  A blood clot that blocks blood flow in a vein (deep vein thrombosis).  Inflammation of a superficial vein (phlebitis) that causes a blood clot to form. RISK FACTORS Various things can make you more likely to develop chronic venous insufficiency, including:  Family history of this condition.  Obesity.  Pregnancy.  Sedentary lifestyle.  Smoking.  Jobs requiring long periods of standing or sitting in one place.  Being a certain age. Women in their 49s and 66s and men in their 73s are more likely to develop this condition. SIGNS AND SYMPTOMS  Symptoms may include:   Varicose veins.  Skin breakdown or ulcers.  Reddened or discolored skin on the leg.  Brown, smooth, tight, and painful skin just above the ankle, usually on the inside surface (lipodermatosclerosis).  Swelling. DIAGNOSIS  To diagnose this condition, your health care provider will take a medical history and do a physical exam. The following tests may be ordered to confirm the diagnosis:  Duplex ultrasound--A procedure that  produces a picture of a blood vessel and nearby organs and also provides information on blood flow through the blood vessel.  Plethysmography--A procedure that tests blood flow.  A venogram, or venography--A procedure used to look at the veins using X-ray and dye. TREATMENT The goals of treatment are to help you return to an active life and to minimize pain or disability. Treatment will depend on the severity of the condition. Medical procedures may be needed for severe cases. Treatment options may include:   Use of compression stockings. These can help with symptoms and lower the chances of the problem getting worse, but they do not cure the problem.  Sclerotherapy--A procedure involving an injection of a material that "dissolves" the damaged veins. Other veins in the network of blood vessels take over the function of the damaged veins.  Surgery to remove the vein or cut off blood flow through the vein (vein stripping or laser ablation surgery).  Surgery to repair a valve. HOME CARE INSTRUCTIONS   Wear compression stockings as directed by your health care provider.  Only take over-the-counter or prescription medicines for pain, discomfort, or fever as directed by your health care provider.  Follow up with your health care provider as directed. SEEK MEDICAL CARE IF:   You have redness, swelling, or increasing pain in the affected area.  You see a red streak or line that extends up or down from the affected area.  You have a breakdown or loss of skin in the affected area, even if the breakdown is small.  You have an injury to the affected area. SEEK IMMEDIATE MEDICAL CARE IF:  You have an injury and open wound in the affected area.  Your pain is severe and does not improve with medicine.  You have sudden numbness or weakness in the foot or ankle below the affected area, or you have trouble moving your foot or ankle.  You have a fever or persistent symptoms for more than 2-3  days.  You have a fever and your symptoms suddenly get worse. MAKE SURE YOU:   Understand these instructions.  Will watch your condition.  Will get help right away if you are not doing well or get worse.   This information is not intended to replace advice given to you by your health care provider. Make sure you discuss any questions you have with your health care provider.   Document Released: 01/14/2007 Document Revised: 07/01/2013 Document Reviewed: 05/18/2013 Elsevier Interactive Patient Education [employer]

## 2016-05-22 ENCOUNTER — Encounter: Payer: Self-pay | Admitting: Internal Medicine

## 2016-05-22 ENCOUNTER — Telehealth: Payer: Self-pay | Admitting: *Deleted

## 2016-05-22 ENCOUNTER — Ambulatory Visit (INDEPENDENT_AMBULATORY_CARE_PROVIDER_SITE_OTHER): Payer: Medicare Other | Admitting: Internal Medicine

## 2016-05-22 VITALS — BP 156/90 | HR 59 | Temp 98.1°F | Resp 20 | Ht 59.0 in | Wt 116.0 lb

## 2016-05-22 DIAGNOSIS — D6489 Other specified anemias: Secondary | ICD-10-CM

## 2016-05-22 DIAGNOSIS — I872 Venous insufficiency (chronic) (peripheral): Secondary | ICD-10-CM

## 2016-05-22 DIAGNOSIS — I350 Nonrheumatic aortic (valve) stenosis: Secondary | ICD-10-CM | POA: Insufficient documentation

## 2016-05-22 DIAGNOSIS — M150 Primary generalized (osteo)arthritis: Secondary | ICD-10-CM

## 2016-05-22 DIAGNOSIS — N183 Chronic kidney disease, stage 3 unspecified: Secondary | ICD-10-CM

## 2016-05-22 DIAGNOSIS — M159 Polyosteoarthritis, unspecified: Secondary | ICD-10-CM

## 2016-05-22 LAB — BASIC METABOLIC PANEL
BUN: 28 mg/dL — AB (ref 6–23)
CO2: 29 mEq/L (ref 19–32)
CREATININE: 1.06 mg/dL (ref 0.40–1.20)
Calcium: 9.4 mg/dL (ref 8.4–10.5)
Chloride: 106 mEq/L (ref 96–112)
GFR: 51.13 mL/min — AB (ref >60.00)
Glucose, Bld: 72 mg/dL (ref 70–99)
Potassium: 4.4 mEq/L (ref 3.5–5.1)
Sodium: 142 mEq/L (ref 135–145)

## 2016-05-22 MED ORDER — HYDRALAZINE HCL 25 MG PO TABS: 12.5000 mg | ORAL_TABLET | Freq: Three times a day (TID) | ORAL | 3 refills | Status: DC

## 2016-05-22 MED ORDER — AMLODIPINE BESYLATE 2.5 MG PO TABS: 2.5000 mg | ORAL_TABLET | Freq: Every day | ORAL | 3 refills | Status: DC

## 2016-05-22 MED ORDER — ISOSORBIDE MONONITRATE ER 30 MG PO TB24: 30.0000 mg | ORAL_TABLET | Freq: Every day | ORAL | 4 refills | Status: DC

## 2016-05-22 MED ORDER — QUINAPRIL HCL 40 MG PO TABS: 20.0000 mg | ORAL_TABLET | Freq: Every day | ORAL | 3 refills | Status: DC

## 2016-05-22 MED ORDER — FUROSEMIDE 40 MG PO TABS: ORAL_TABLET | ORAL | 3 refills | Status: DC

## 2016-05-22 NOTE — Progress Notes (Signed)
Pre visit review using our clinic review tool, if applicable. No additional management support is needed unless otherwise documented below in the visit note. 

## 2016-05-22 NOTE — Patient Instructions (Signed)
Use  Nitroglycerin for chest pain or shortness of breath Discontinue hydralazine Amlodipine 2.5 milligrams daily Isosorbide mononitrate 1 tablet at bedtime  Return in one month for follow-up

## 2016-05-22 NOTE — Progress Notes (Signed)
Subjective:    Patient ID: Erin Gibson, female    DOB: 01/12/1919, 82 y.o.   MRN: [id number]  HPI  04/20/16.  82 year old patient who has a long history of chronic venous insufficiency.  She was seen last week and is on antibiotic therapy for increasing swelling and redness involving her left lower leg. She is on diuretic therapy in attempts to keep her legs elevated.  She uses compression hose sporadically but due to the increasing swelling and has had a more difficult time using the compression stockings.  No shortness of breath or pulmonary complaints She has had venous stasis ulceration in the past  She was seen in the ED in May for lower extremity swelling. A lower extremity Doppler study was performed with no evidence of DVT  05/22/16.  Patient returns today for follow-up.  Her bilateral calf edema is unchanged.  She does use support hose but these only reached to below the knees bilaterally  For the past month.  She complains of some increasing chest pain and shortness of breath when she awakes in the morning.  She states the symptoms last about 20 minutes and occur almost daily.  No symptoms throughout the day  Past Medical History:  Diagnosis Date  . ANEMIA 03/31/2009  . Chronic kidney disease   . DEGENERATIVE JOINT DISEASE, GENERALIZED 05/06/2007  . Diverticulosis of sigmoid colon    Colonoscopy 2011  . DVT (deep venous thrombosis) (HCC)    hx of  . GERD 04/24/2007  . HYPERTENSION 04/24/2007  . VENOUS STASIS ULCER 07/20/2008     Social History   Social History  . Marital status: Widowed    Spouse name: N/A  . Number of children: N/A  . Years of education: N/A   Occupational History  . Not on file.   Social History Main Topics  . Smoking status: Never Smoker  . Smokeless tobacco: Never Used  . Alcohol use No  . Drug use: No  . Sexual activity: Not on file   Other Topics Concern  . Not on file   Social History Narrative  . No narrative on file    Past  Surgical History:  Procedure Laterality Date  . ABDOMINAL HYSTERECTOMY     Completion hysterectomy  . CATARACT EXTRACTION    . CHOLECYSTECTOMY OPEN    . HERNIA REPAIR    . PARTIAL HYSTERECTOMY     Salpingo-oophorectomy as well  . TOTAL KNEE ARTHROPLASTY     x2    No family history on file.  Allergies  Allergen Reactions  . Codeine Nausea And Vomiting    Violently ill  . Diltiazem Hcl Other (See Comments)    Not sure about this reaction  . Ibuprofen Nausea And Vomiting  . Tramadol     Auditory and visual hallucinations    Current Outpatient Prescriptions on File Prior to Visit  Medication Sig Dispense Refill  . acetaminophen (TYLENOL) 325 MG tablet Take 2 tablets (650 mg total) by mouth every 4 (four) hours as needed for mild pain or moderate pain. 30 tablet 0  . Artificial Tear GEL Place 2 drops into both eyes 2 (two) times daily.    Marland Kitchen. aspirin EC 81 MG tablet Take 81 mg by mouth every morning.    . Cyanocobalamin (VITAMIN B-12) 2500 MCG SUBL Place 1 tablet under the tongue every morning.    . feeding supplement, ENSURE ENLIVE, (ENSURE ENLIVE) LIQD Take 237 mLs by mouth 2 (two) times daily between meals.  237 mL 12  . Multiple Vitamins-Minerals (PRESERVISION AREDS 2) CAPS Take 1 capsule by mouth daily. 90 capsule 3  . nitroGLYCERIN (NITROSTAT) 0.4 MG SL tablet Place 1 tablet (0.4 mg total) under the tongue every 5 (five) minutes as needed for chest pain. 25 tablet 2  . polyethylene glycol powder (GLYCOLAX/MIRALAX) powder MIX 17GM IN LIQUID AND     DRINK TWO TIMES A DAY 3162 g 3   No current facility-administered medications on file prior to visit.     BP (!) 156/90 (BP Location: Left Arm, Patient Position: Sitting, Cuff Size: Normal)   Pulse (!) 59   Temp 98.1 F (36.7 C) (Oral)   Resp 20   Ht 4\' 11"  (1.499 m)   Wt 116 lb (52.6 kg)   SpO2 99%   BMI 23.43 kg/m     Review of Systems  Constitutional: Negative.   HENT: Negative for congestion, dental problem, hearing  loss, rhinorrhea, sinus pressure, sore throat and tinnitus.   Eyes: Negative for pain, discharge and visual disturbance.  Respiratory: Positive for shortness of breath. Negative for cough.   Cardiovascular: Positive for chest pain and leg swelling. Negative for palpitations.  Gastrointestinal: Negative for abdominal distention, abdominal pain, blood in stool, constipation, diarrhea, nausea and vomiting.  Genitourinary: Negative for difficulty urinating, dysuria, flank pain, frequency, hematuria, pelvic pain, urgency, vaginal bleeding, vaginal discharge and vaginal pain.  Musculoskeletal: Negative for arthralgias, gait problem and joint swelling.  Skin: Negative for rash.  Neurological: Negative for dizziness, syncope, speech difficulty, weakness, numbness and headaches.  Hematological: Negative for adenopathy.  Psychiatric/Behavioral: Negative for agitation, behavioral problems and dysphoric mood. The patient is not nervous/anxious.        Objective:   Physical Exam  Constitutional: She is oriented to person, place, and time. She appears well-developed and well-nourished.  Blood pressure 140/90  HENT:  Head: Normocephalic.  Right Ear: External ear normal.  Left Ear: External ear normal.  Mouth/Throat: Oropharynx is clear and moist.  Eyes: Conjunctivae and EOM are normal. Pupils are equal, round, and reactive to light.  Neck: Normal range of motion. Neck supple. No thyromegaly present.  Cardiovascular: Normal rate and regular rhythm.   Murmur heard. Grade 3/6 systolic murmur  Pulmonary/Chest: Effort normal and breath sounds normal.  Abdominal: Soft. Bowel sounds are normal. She exhibits no mass. There is no tenderness.  Musculoskeletal: Normal range of motion. She exhibits edema.  Prominent bilateral calf edema but spares ankles and feet  Lymphadenopathy:    She has no cervical adenopathy.  Neurological: She is alert and oriented to person, place, and time.  Skin: Skin is warm and  dry. No rash noted.  Psychiatric: She has a normal mood and affect. Her behavior is normal.          Assessment & Plan:   Chronic venous insufficiency.  The patient will discontinue support hose that terminate just distal to the knees.  Continue furosemide.  Check electrolytes History of chest pain and shortness of breath.  The patient instructed on nitroglycerin use.  Will substitute amlodipine for apresaline Moderate aortic stenosis   Recheck one month Low-salt diet recommended  Rogelia Boga

## 2016-05-22 NOTE — Telephone Encounter (Signed)
Called [facility] [place] cancelled Rx for Hydralazine per Dr.K. Carollee HerterShannon verbalized understanding.

## 2016-06-05 ENCOUNTER — Inpatient Hospital Stay (HOSPITAL_COMMUNITY)
Admission: EM | Admit: 2016-06-05 | Discharge: 2016-06-10 | DRG: 388 | Disposition: A | Discharge status: Skilled Nursing Facility | Payer: Medicare Other | Attending: Internal Medicine | Admitting: Internal Medicine

## 2016-06-05 ENCOUNTER — Emergency Department (HOSPITAL_COMMUNITY): Payer: Medicare Other

## 2016-06-05 ENCOUNTER — Encounter (HOSPITAL_COMMUNITY): Payer: Self-pay | Admitting: *Deleted

## 2016-06-05 DIAGNOSIS — I10 Essential (primary) hypertension: Secondary | ICD-10-CM | POA: Diagnosis present

## 2016-06-05 DIAGNOSIS — R262 Difficulty in walking, not elsewhere classified: Secondary | ICD-10-CM | POA: Diagnosis present

## 2016-06-05 DIAGNOSIS — K56609 Unspecified intestinal obstruction, unspecified as to partial versus complete obstruction: Secondary | ICD-10-CM | POA: Diagnosis present

## 2016-06-05 DIAGNOSIS — K2970 Gastritis, unspecified, without bleeding: Secondary | ICD-10-CM | POA: Diagnosis not present

## 2016-06-05 DIAGNOSIS — K219 Gastro-esophageal reflux disease without esophagitis: Secondary | ICD-10-CM | POA: Diagnosis present

## 2016-06-05 DIAGNOSIS — N183 Chronic kidney disease, stage 3 unspecified: Secondary | ICD-10-CM | POA: Diagnosis present

## 2016-06-05 DIAGNOSIS — Z0189 Encounter for other specified special examinations: Secondary | ICD-10-CM

## 2016-06-05 DIAGNOSIS — I129 Hypertensive chronic kidney disease with stage 1 through stage 4 chronic kidney disease, or unspecified chronic kidney disease: Secondary | ICD-10-CM | POA: Diagnosis present

## 2016-06-05 DIAGNOSIS — Z978 Presence of other specified devices: Secondary | ICD-10-CM | POA: Diagnosis not present

## 2016-06-05 DIAGNOSIS — Z66 Do not resuscitate: Secondary | ICD-10-CM | POA: Diagnosis present

## 2016-06-05 DIAGNOSIS — R112 Nausea with vomiting, unspecified: Secondary | ICD-10-CM | POA: Diagnosis not present

## 2016-06-05 DIAGNOSIS — Z4659 Encounter for fitting and adjustment of other gastrointestinal appliance and device: Secondary | ICD-10-CM

## 2016-06-05 DIAGNOSIS — Z9181 History of falling: Secondary | ICD-10-CM

## 2016-06-05 DIAGNOSIS — E43 Unspecified severe protein-calorie malnutrition: Secondary | ICD-10-CM | POA: Diagnosis not present

## 2016-06-05 DIAGNOSIS — R109 Unspecified abdominal pain: Secondary | ICD-10-CM | POA: Diagnosis not present

## 2016-06-05 DIAGNOSIS — N390 Urinary tract infection, site not specified: Secondary | ICD-10-CM | POA: Diagnosis not present

## 2016-06-05 DIAGNOSIS — R079 Chest pain, unspecified: Secondary | ICD-10-CM | POA: Diagnosis present

## 2016-06-05 DIAGNOSIS — M1990 Unspecified osteoarthritis, unspecified site: Secondary | ICD-10-CM | POA: Diagnosis present

## 2016-06-05 DIAGNOSIS — Z7982 Long term (current) use of aspirin: Secondary | ICD-10-CM

## 2016-06-05 DIAGNOSIS — Z6821 Body mass index (BMI) 21.0-21.9, adult: Secondary | ICD-10-CM

## 2016-06-05 DIAGNOSIS — K5669 Other intestinal obstruction: Secondary | ICD-10-CM | POA: Diagnosis present

## 2016-06-05 DIAGNOSIS — K565 Intestinal adhesions [bands] with obstruction (postprocedural) (postinfection): Secondary | ICD-10-CM | POA: Diagnosis not present

## 2016-06-05 DIAGNOSIS — B961 Klebsiella pneumoniae [K. pneumoniae] as the cause of diseases classified elsewhere: Secondary | ICD-10-CM | POA: Diagnosis present

## 2016-06-05 DIAGNOSIS — E876 Hypokalemia: Secondary | ICD-10-CM | POA: Diagnosis present

## 2016-06-05 DIAGNOSIS — Z23 Encounter for immunization: Secondary | ICD-10-CM

## 2016-06-05 LAB — COMPREHENSIVE METABOLIC PANEL
ALBUMIN: 3.2 g/dL — AB (ref 3.5–5.0)
ALT: 11 U/L — AB (ref 14–54)
ANION GAP: 6 (ref 5–15)
AST: 21 U/L (ref 15–41)
Alkaline Phosphatase: 99 U/L (ref 38–126)
BUN: 31 mg/dL — AB (ref 6–20)
CALCIUM: 8.8 mg/dL — AB (ref 8.9–10.3)
CO2: 24 mmol/L (ref 22–32)
Chloride: 110 mmol/L (ref 101–111)
Creatinine, Ser: 1.23 mg/dL — ABNORMAL HIGH (ref 0.44–1.00)
GFR calc non Af Amer: 36 mL/min — ABNORMAL LOW (ref >60)
GFR, EST AFRICAN AMERICAN: 42 mL/min — AB (ref >60)
Glucose, Bld: 120 mg/dL — ABNORMAL HIGH (ref 65–99)
POTASSIUM: 4.5 mmol/L (ref 3.5–5.1)
SODIUM: 140 mmol/L (ref 135–145)
TOTAL PROTEIN: 5.9 g/dL — AB (ref 6.5–8.1)
Total Bilirubin: 0.6 mg/dL (ref 0.3–1.2)

## 2016-06-05 LAB — URINALYSIS, ROUTINE W REFLEX MICROSCOPIC
BILIRUBIN URINE: NEGATIVE
Glucose, UA: NEGATIVE mg/dL
KETONES UR: NEGATIVE mg/dL
NITRITE: POSITIVE — AB
PH: 5 (ref 5.0–8.0)
PROTEIN: NEGATIVE mg/dL
Specific Gravity, Urine: 1.015 (ref 1.005–1.030)

## 2016-06-05 LAB — I-STAT TROPONIN, ED: TROPONIN I, POC: 0 ng/mL (ref 0.00–0.08)

## 2016-06-05 LAB — CBC
HEMATOCRIT: 39.3 % (ref 36.0–46.0)
Hemoglobin: 12.4 g/dL (ref 12.0–15.0)
MCH: 27.9 pg (ref 26.0–34.0)
MCHC: 31.6 g/dL (ref 30.0–36.0)
MCV: 88.5 fL (ref 78.0–100.0)
Platelets: 241 10*3/uL (ref 150–400)
RBC: 4.44 MIL/uL (ref 3.87–5.11)
RDW: 14.2 % (ref 11.5–15.5)
WBC: 10.4 10*3/uL (ref 4.0–10.5)

## 2016-06-05 LAB — URINE MICROSCOPIC-ADD ON

## 2016-06-05 LAB — LIPASE, BLOOD: LIPASE: 41 U/L (ref 11–51)

## 2016-06-05 MED ORDER — IOPAMIDOL (ISOVUE-300) INJECTION 61%
INTRAVENOUS | Status: AC
  Filled 2016-06-05: qty 100

## 2016-06-05 NOTE — ED Provider Notes (Signed)
[facility]-EMERGENCY DEPT Provider Note   CSN: [id number] Arrival date & time: 06/05/16  2111     History   Chief Complaint Chief Complaint  Patient presents with  . Abdominal Pain  . Nausea  . Emesis    HPI Erin Gibson is a 82 y.o. female.  Patient is a 82 year old female with history of chronic renal insufficiency, multiple abdominal surgeries. She presents for evaluation of crampy abdominal pain that started earlier this afternoon. She then reports large quantities of emesis and multiple episodes of this. She does report having a bowel movement this afternoon. All has been nonbloody and nonbilious. She denies fevers.    Abdominal Pain   Associated symptoms include vomiting.  Emesis   Associated symptoms include abdominal pain.    Past Medical History:  Diagnosis Date  . ANEMIA 03/31/2009  . Chronic kidney disease   . DEGENERATIVE JOINT DISEASE, GENERALIZED 05/06/2007  . Diverticulosis of sigmoid colon    Colonoscopy 2011  . DVT (deep venous thrombosis) (HCC)    hx of  . GERD 04/24/2007  . HYPERTENSION 04/24/2007  . VENOUS STASIS ULCER 07/20/2008    Patient Active Problem List   Diagnosis Date Noted  . Moderate aortic stenosis 05/22/2016  . Chronic venous insufficiency 04/20/2016  . Chest pain 01/01/2016  . Fall 12/25/2015  . Elevated troponin 12/25/2015  . Scalp laceration 12/25/2015  . Hyperkalemia 12/25/2015  . UTI (lower urinary tract infection) 12/25/2015  . Odynophagia 07/23/2015  . CKD (chronic kidney disease), stage III 07/23/2015  . Faintness   . UTI (urinary tract infection) 07/22/2015  . Abdominal pain 07/22/2015  . Nausea with vomiting 07/22/2015  . Syncope 07/21/2015  . Abnormality of gait 01/22/2014  . Partial small bowel obstruction (HCC) 03/09/2013  . Inguinal hernia, left - Contains sigmoid colon.  Reducible 03/09/2013  . Anemia 03/31/2009  . VENOUS STASIS ULCER 07/20/2008  . Osteoarthritis 05/06/2007  . Essential hypertension 04/24/2007    . GERD 04/24/2007    Past Surgical History:  Procedure Laterality Date  . ABDOMINAL HYSTERECTOMY     Completion hysterectomy  . CATARACT EXTRACTION    . CHOLECYSTECTOMY OPEN    . HERNIA REPAIR    . PARTIAL HYSTERECTOMY     Salpingo-oophorectomy as well  . TOTAL KNEE ARTHROPLASTY     x2    OB History    No data available       Home Medications    Prior to Admission medications   Medication Sig Start Date End Date Taking? Authorizing Provider  acetaminophen (TYLENOL) 325 MG tablet Take 2 tablets (650 mg total) by mouth every 4 (four) hours as needed for mild pain or moderate pain. 01/02/16  Yes Penny Piarlando Vega, MD  Artificial Tear GEL Place 2 drops into both eyes 2 (two) times daily.   Yes Historical Provider, MD  aspirin EC 81 MG tablet Take 81 mg by mouth every morning.   Yes Historical Provider, MD  Cyanocobalamin (VITAMIN B-12) 2500 MCG SUBL Place 1 tablet under the tongue every morning.   Yes Historical Provider, MD  furosemide (LASIX) 40 MG tablet TAKE 1/2 TABLET (=20MG )    DAILY 05/22/16  Yes Gordy SaversPeter F Kwiatkowski, MD  isosorbide mononitrate (IMDUR) 30 MG 24 hr tablet Take 1 tablet (30 mg total) by mouth daily. 05/22/16  Yes Gordy SaversPeter F Kwiatkowski, MD  Multiple Vitamins-Minerals (PRESERVISION AREDS 2) CAPS Take 1 capsule by mouth daily. 10/06/15  Yes Gordy SaversPeter F Kwiatkowski, MD  nitroGLYCERIN (NITROSTAT) 0.4 MG SL tablet Place  1 tablet (0.4 mg total) under the tongue every 5 (five) minutes as needed for chest pain. 04/20/16  Yes Gordy Savers, MD  polyethylene glycol powder (GLYCOLAX/MIRALAX) powder MIX 17GM IN LIQUID AND     DRINK TWO TIMES A DAY 10/06/15  Yes Gordy Savers, MD  quinapril (ACCUPRIL) 40 MG tablet Take 0.5 tablets (20 mg total) by mouth daily. 05/22/16  Yes Gordy Savers, MD  amLODipine (NORVASC) 2.5 MG tablet Take 1 tablet (2.5 mg total) by mouth daily. Patient not taking: Reported on 06/05/2016 05/22/16   Gordy Savers, MD  feeding supplement, ENSURE  ENLIVE, (ENSURE ENLIVE) LIQD Take 237 mLs by mouth 2 (two) times daily between meals. Patient not taking: Reported on 06/05/2016 01/02/16   Penny Pia, MD    Family History History reviewed. No pertinent family history.  Social History Social History  Substance Use Topics  . Smoking status: Never Smoker  . Smokeless tobacco: Never Used  . Alcohol use No     Allergies   Tramadol; Codeine; Ibuprofen; and Diltiazem hcl   Review of Systems Review of Systems  Gastrointestinal: Positive for abdominal pain and vomiting.  All other systems reviewed and are negative.    Physical Exam Updated Vital Signs BP 154/85   Pulse 78   Temp 98.2 F (36.8 C) (Oral)   Resp 23   SpO2 96%   Physical Exam  Constitutional: She is oriented to person, place, and time. She appears well-developed and well-nourished. No distress.  HENT:  Head: Normocephalic and atraumatic.  Neck: Normal range of motion. Neck supple.  Cardiovascular: Normal rate and regular rhythm.  Exam reveals no gallop and no friction rub.   No murmur heard. Pulmonary/Chest: Effort normal and breath sounds normal. No respiratory distress. She has no wheezes.  Abdominal: Soft. Bowel sounds are normal. She exhibits no distension. There is tenderness.  There is tenderness to palpation in all 4 quadrants. There is no rebound or guarding. The abdomen is somewhat tympanitic.  Musculoskeletal: Normal range of motion.  Neurological: She is alert and oriented to person, place, and time.  Skin: Skin is warm and dry. She is not diaphoretic.  Nursing note and vitals reviewed.    ED Treatments / Results  Labs (all labs ordered are listed, but only abnormal results are displayed) Labs Reviewed  CBC  LIPASE, BLOOD  COMPREHENSIVE METABOLIC PANEL  URINALYSIS, ROUTINE W REFLEX MICROSCOPIC (NOT AT [facility])  I-STAT TROPOININ, ED    EKG  EKG Interpretation None       Radiology No results found.  Procedures Procedures  (including critical care time)  Medications Ordered in ED Medications - No data to display   Initial Impression / Assessment and Plan / ED Course  I have reviewed the triage vital signs and the nursing notes.  Pertinent labs & imaging results that were available during my care of the patient were reviewed by me and considered in my medical decision making (see chart for details).  Clinical Course    Patient presents with complaints of abdominal pain and vomiting. She has a history of prior abdominal surgeries. CT scan today shows a high-grade small bowel obstruction. She will be admitted to the hospitalist service with surgical consultation. An NG tube will be placed.  Final Clinical Impressions(s) / ED Diagnoses   Final diagnoses:  None    New Prescriptions New Prescriptions   No medications on file     Geoffery Lyons, MD 06/07/16 1113

## 2016-06-05 NOTE — ED Triage Notes (Signed)
Per EMS: pt coming from home with c/o abdominal pain, n/v, and CP. Pt had sudden onset of lower abdominal pain around 1600, normal BM today. Pt has taken 5 nitro with relief of CP. Pt still c/o lower abdominal pain. 8/10 pain.

## 2016-06-06 ENCOUNTER — Observation Stay (HOSPITAL_COMMUNITY): Payer: Medicare Other

## 2016-06-06 ENCOUNTER — Encounter (HOSPITAL_COMMUNITY): Payer: Self-pay | Admitting: Family Medicine

## 2016-06-06 ENCOUNTER — Inpatient Hospital Stay (HOSPITAL_COMMUNITY): Payer: Medicare Other

## 2016-06-06 DIAGNOSIS — Z66 Do not resuscitate: Secondary | ICD-10-CM | POA: Diagnosis present

## 2016-06-06 DIAGNOSIS — E43 Unspecified severe protein-calorie malnutrition: Secondary | ICD-10-CM | POA: Diagnosis present

## 2016-06-06 DIAGNOSIS — I10 Essential (primary) hypertension: Secondary | ICD-10-CM | POA: Diagnosis not present

## 2016-06-06 DIAGNOSIS — N390 Urinary tract infection, site not specified: Secondary | ICD-10-CM | POA: Diagnosis present

## 2016-06-06 DIAGNOSIS — R2681 Unsteadiness on feet: Secondary | ICD-10-CM | POA: Diagnosis not present

## 2016-06-06 DIAGNOSIS — K219 Gastro-esophageal reflux disease without esophagitis: Secondary | ICD-10-CM | POA: Diagnosis not present

## 2016-06-06 DIAGNOSIS — R1013 Epigastric pain: Secondary | ICD-10-CM | POA: Diagnosis not present

## 2016-06-06 DIAGNOSIS — M1990 Unspecified osteoarthritis, unspecified site: Secondary | ICD-10-CM | POA: Diagnosis present

## 2016-06-06 DIAGNOSIS — E876 Hypokalemia: Secondary | ICD-10-CM | POA: Diagnosis present

## 2016-06-06 DIAGNOSIS — M6281 Muscle weakness (generalized): Secondary | ICD-10-CM | POA: Diagnosis not present

## 2016-06-06 DIAGNOSIS — R079 Chest pain, unspecified: Secondary | ICD-10-CM | POA: Diagnosis not present

## 2016-06-06 DIAGNOSIS — Z5189 Encounter for other specified aftercare: Secondary | ICD-10-CM | POA: Diagnosis not present

## 2016-06-06 DIAGNOSIS — Z4682 Encounter for fitting and adjustment of non-vascular catheter: Secondary | ICD-10-CM | POA: Diagnosis not present

## 2016-06-06 DIAGNOSIS — K565 Intestinal adhesions [bands] with obstruction (postprocedural) (postinfection): Secondary | ICD-10-CM | POA: Diagnosis present

## 2016-06-06 DIAGNOSIS — R278 Other lack of coordination: Secondary | ICD-10-CM | POA: Diagnosis not present

## 2016-06-06 DIAGNOSIS — R109 Unspecified abdominal pain: Secondary | ICD-10-CM | POA: Diagnosis not present

## 2016-06-06 DIAGNOSIS — Z7982 Long term (current) use of aspirin: Secondary | ICD-10-CM | POA: Diagnosis not present

## 2016-06-06 DIAGNOSIS — K5669 Other intestinal obstruction: Secondary | ICD-10-CM | POA: Diagnosis not present

## 2016-06-06 DIAGNOSIS — Z6821 Body mass index (BMI) 21.0-21.9, adult: Secondary | ICD-10-CM | POA: Diagnosis not present

## 2016-06-06 DIAGNOSIS — R531 Weakness: Secondary | ICD-10-CM | POA: Diagnosis not present

## 2016-06-06 DIAGNOSIS — R100 Acute abdomen: Secondary | ICD-10-CM | POA: Diagnosis not present

## 2016-06-06 DIAGNOSIS — K5660 Unspecified intestinal obstruction: Secondary | ICD-10-CM | POA: Diagnosis not present

## 2016-06-06 DIAGNOSIS — I129 Hypertensive chronic kidney disease with stage 1 through stage 4 chronic kidney disease, or unspecified chronic kidney disease: Secondary | ICD-10-CM | POA: Diagnosis present

## 2016-06-06 DIAGNOSIS — Z9181 History of falling: Secondary | ICD-10-CM | POA: Diagnosis not present

## 2016-06-06 DIAGNOSIS — N183 Chronic kidney disease, stage 3 (moderate): Secondary | ICD-10-CM | POA: Diagnosis not present

## 2016-06-06 DIAGNOSIS — B961 Klebsiella pneumoniae [K. pneumoniae] as the cause of diseases classified elsewhere: Secondary | ICD-10-CM | POA: Diagnosis present

## 2016-06-06 DIAGNOSIS — R262 Difficulty in walking, not elsewhere classified: Secondary | ICD-10-CM | POA: Diagnosis present

## 2016-06-06 DIAGNOSIS — Z23 Encounter for immunization: Secondary | ICD-10-CM | POA: Diagnosis not present

## 2016-06-06 LAB — BASIC METABOLIC PANEL
Anion gap: 8 (ref 5–15)
BUN: 30 mg/dL — AB (ref 6–20)
CALCIUM: 9 mg/dL (ref 8.9–10.3)
CO2: 22 mmol/L (ref 22–32)
CREATININE: 1.18 mg/dL — AB (ref 0.44–1.00)
Chloride: 109 mmol/L (ref 101–111)
GFR calc non Af Amer: 38 mL/min — ABNORMAL LOW (ref >60)
GFR, EST AFRICAN AMERICAN: 44 mL/min — AB (ref >60)
Glucose, Bld: 90 mg/dL (ref 65–99)
Potassium: 4.4 mmol/L (ref 3.5–5.1)
Sodium: 139 mmol/L (ref 135–145)

## 2016-06-06 LAB — TROPONIN I: Troponin I: <0.03 ng/mL (ref <0.03)

## 2016-06-06 MED ORDER — FAMOTIDINE IN NACL 20-0.9 MG/50ML-% IV SOLN
20.0000 mg | INTRAVENOUS | Status: DC
  Administered 2016-06-07 – 2016-06-10 (×4): 20 mg via INTRAVENOUS
  Filled 2016-06-06 (×4): qty 50

## 2016-06-06 MED ORDER — ONDANSETRON HCL 4 MG/2ML IJ SOLN: 4.0000 mg | Freq: Four times a day (QID) | INTRAMUSCULAR | Status: DC | PRN

## 2016-06-06 MED ORDER — INFLUENZA VAC SPLIT QUAD 0.5 ML IM SUSY
0.5000 mL | PREFILLED_SYRINGE | INTRAMUSCULAR | Status: AC
  Administered 2016-06-07: 0.5 mL via INTRAMUSCULAR
  Filled 2016-06-06: qty 0.5

## 2016-06-06 MED ORDER — NITROGLYCERIN 0.4 MG SL SUBL: 0.4000 mg | SUBLINGUAL_TABLET | SUBLINGUAL | Status: DC | PRN

## 2016-06-06 MED ORDER — DIATRIZOATE MEGLUMINE & SODIUM 66-10 % PO SOLN
ORAL | Status: AC
  Filled 2016-06-06: qty 30

## 2016-06-06 MED ORDER — DIATRIZOATE MEGLUMINE & SODIUM 66-10 % PO SOLN
ORAL | Status: AC
  Administered 2016-06-06: 90 mL via NASOGASTRIC
  Filled 2016-06-06: qty 60

## 2016-06-06 MED ORDER — DIATRIZOATE MEGLUMINE & SODIUM 66-10 % PO SOLN
90.0000 mL | Freq: Once | ORAL | Status: AC
  Administered 2016-06-06: 90 mL via NASOGASTRIC
  Filled 2016-06-06: qty 90

## 2016-06-06 MED ORDER — DEXTROSE-NACL 5-0.45 % IV SOLN
INTRAVENOUS | Status: DC
  Administered 2016-06-06 – 2016-06-07 (×2): via INTRAVENOUS
  Filled 2016-06-06 (×2): qty 1000

## 2016-06-06 MED ORDER — HYDRALAZINE HCL 20 MG/ML IJ SOLN
5.0000 mg | INTRAMUSCULAR | Status: DC | PRN
  Administered 2016-06-06 – 2016-06-08 (×3): 5 mg via INTRAVENOUS
  Filled 2016-06-06 (×3): qty 1

## 2016-06-06 MED ORDER — SODIUM CHLORIDE 0.9% FLUSH
3.0000 mL | Freq: Two times a day (BID) | INTRAVENOUS | Status: DC
  Administered 2016-06-07 – 2016-06-10 (×6): 3 mL via INTRAVENOUS

## 2016-06-06 MED ORDER — HYDROMORPHONE HCL 1 MG/ML IJ SOLN
INTRAMUSCULAR | Status: AC
  Administered 2016-06-06: 02:00:00
  Filled 2016-06-06: qty 1

## 2016-06-06 MED ORDER — HEPARIN SODIUM (PORCINE) 5000 UNIT/ML IJ SOLN
INTRAMUSCULAR | Status: AC
  Administered 2016-06-06: 02:00:00
  Filled 2016-06-06: qty 1

## 2016-06-06 MED ORDER — FENTANYL CITRATE (PF) 100 MCG/2ML IJ SOLN
12.5000 ug | INTRAMUSCULAR | Status: DC | PRN
  Administered 2016-06-07: 12.5 ug via INTRAVENOUS
  Filled 2016-06-06: qty 2

## 2016-06-06 MED ORDER — ORAL CARE MOUTH RINSE
15.0000 mL | Freq: Two times a day (BID) | OROMUCOSAL | Status: DC
  Administered 2016-06-06 – 2016-06-09 (×8): 15 mL via OROMUCOSAL

## 2016-06-06 MED ORDER — SODIUM CHLORIDE 0.9 % IV SOLN
INTRAVENOUS | Status: AC
  Administered 2016-06-06: 03:00:00 via INTRAVENOUS

## 2016-06-06 MED ORDER — HEPARIN SODIUM (PORCINE) 5000 UNIT/ML IJ SOLN
5000.0000 [IU] | Freq: Three times a day (TID) | INTRAMUSCULAR | Status: DC
  Administered 2016-06-06 – 2016-06-10 (×12): 5000 [IU] via SUBCUTANEOUS
  Filled 2016-06-06 (×12): qty 1

## 2016-06-06 MED ORDER — FAMOTIDINE IN NACL 20-0.9 MG/50ML-% IV SOLN
20.0000 mg | Freq: Two times a day (BID) | INTRAVENOUS | Status: DC
  Administered 2016-06-06 (×2): 20 mg via INTRAVENOUS
  Filled 2016-06-06 (×3): qty 50

## 2016-06-06 MED ORDER — ONDANSETRON HCL 4 MG PO TABS: 4.0000 mg | ORAL_TABLET | Freq: Four times a day (QID) | ORAL | Status: DC | PRN

## 2016-06-06 MED ORDER — LIDOCAINE VISCOUS 2 % MT SOLN
OROMUCOSAL | Status: AC
  Filled 2016-06-06: qty 15

## 2016-06-06 NOTE — ED Notes (Signed)
See downtime record

## 2016-06-06 NOTE — Progress Notes (Signed)
Initial Nutrition Assessment  DOCUMENTATION CODES:   Severe malnutrition in context of chronic illness  INTERVENTION:   -RD will follow for diet advancement and supplement as appropriate  NUTRITION DIAGNOSIS:   Malnutrition related to chronic illness as evidenced by severe depletion of muscle mass, severe depletion of body fat.  GOAL:   Patient will meet greater than or equal to 90% of their needs  MONITOR:   PO intake, Supplement acceptance, Labs, Weight trends, Skin, I & O's  REASON FOR ASSESSMENT:   Malnutrition Screening Tool    ASSESSMENT:   Erin Gibson is a charming 82 y.o. female with medical history significant for CKD stage III, hypertension, multiple abdominal surgeries, and recurrent SBO who presents to the ED for evaluation of abdominal pain, nausea, and non-bloody vomiting. Patient reports occasional, fleeting pains in her lower abdomen for a few days, but continued her usual diet without incident and had a normal bowel movement on the day of presentation. Later that evening, Erin Gibson suffered a severe recurrence in the low-mid abdominal pain that was persistent. Nausea soon followed and she began vomiting. Pain, described as severe, crampy, band-like across mid-low abdomen, and without alleviating or exacerbating factors identified. She also developed central chest pain and dyspnea amidst all of this and took several SL nitroglycerin doses with relief in those symptoms. She reports some improvement in her symptoms by time of arrival and she attributes this to vomiting.   Pt admitted with SBO.   Spoke with pt at bedside. She states she lives alone, but has supportive family and neighbors. She reveals fair appetite; typical intake is 2 meals per day (Breakfast: cereal, Dinner: Meals on [employer] meal). Pt reports she does not eat a lot of meat, due to toughness, and daughter is concerned that pt is not receiving enough protein in her diet. Discussed other protein-rich  foods other than meats (fish, yogurt, peanut butter, cottage cheese, milk) that pt could incorporate in her diet in place of meat to increase protein intake.  Pt reports UBW is around 106# and attributes recent wt gain to edema in her legs.   Pt is currently NPO. Noted NGT connected to low intermittent suction.  Nutrition-Focused physical exam completed. Findings are moderate to severe fat depletion, moderate to severe muscle depletion, and mild edema.   Pt expressed understanding about rationale for NPO diet order. Discussed potential for diet advancement as well ad importance of good PO intake once diet is advanced.   Labs reviewed.   Diet Order:  Diet NPO time specified Except for: Ice Chips  Skin:  Reviewed, no issues  Last BM:  06/05/16  Height:   Ht Readings from Last 1 Encounters:  06/06/16 4' 10.5" (1.486 m)    Weight:   Wt Readings from Last 1 Encounters:  06/06/16 115 lb 1.3 oz (52.2 kg)    Ideal Body Weight:  43.6 kg  BMI:  Body mass index is 23.64 kg/m.  Estimated Nutritional Needs:   Kcal:  1500-1700  Protein:  75-90 grams  Fluid:  1.5-1.7 L  EDUCATION NEEDS:   No education needs identified at this time  Charels Stambaugh A. Mayford KnifeWilliams, RD, LDN, CDE Pager: [phone] After hours Pager: [phone]

## 2016-06-06 NOTE — Progress Notes (Signed)
Subjective: NG just placed, they're drawing blood on her now. Still distended with some hyperactive bowel sounds. Anxious over everything is going on.  Objective: Vital signs in last 24 hours: Temp:  [97.8 F (36.6 C)-98.2 F (36.8 C)] 98.2 F (36.8 C) (09/13 0325) Pulse Rate:  [51-78] 51 (09/13 0325) Resp:  [13-23] 17 (09/13 0325) BP: (115-167)/(52-85) 167/56 (09/13 0325) SpO2:  [93 %-100 %] 97 % (09/13 0325) Weight:  [52.2 kg (115 lb 1.3 oz)] 52.2 kg (115 lb 1.3 oz) (09/13 0325)   Afebrile vital signs are stable P M labs shows a creatinine 1.23 troponins negative 2 no labs this a.m. NG placed by fluoroscopy/small bowel protocol ordered CT scan completed 00 44 hours 06/06/16 shows high-grade mechanical small bowel obstruction small fluid and bowel contained in the left inguinal hernia it appeared to be a loop of colon and not small bowel, not favored to be point of obstruction. 3.4 cm infrarenal abdominal aortic aneurysm Intake/Output from previous day: 09/12 0701 - 09/13 0700 In: -  Out: 650 [Urine:650] Intake/Output this shift: No intake/output data recorded.  General appearance: alert, cooperative, no distress and Very anxious. GI: Mildly distended, some hyperactive bowel sounds, not really tender with palpation.  Lab Results:   Recent Labs  06/05/16 2125  WBC 10.4  HGB 12.4  HCT 39.3  PLT 241    BMET  Recent Labs  06/05/16 2125  NA 140  K 4.5  CL 110  CO2 24  GLUCOSE 120*  BUN 31*  CREATININE 1.23*  CALCIUM 8.8*   PT/INR No results for input(s): LABPROT, INR in the last 72 hours.   Recent Labs Lab 06/05/16 2125  AST 21  ALT 11*  ALKPHOS 99  BILITOT 0.6  PROT 5.9*  ALBUMIN 3.2*     Lipase     Component Value Date/Time   LIPASE 41 06/05/2016 2125     Studies/Results: Ct Abdomen Pelvis Wo Contrast  Result Date: 06/06/2016 CLINICAL DATA:  Initial evaluation for acute abdominal pain with vomiting. Concern for possible small bowel  obstruction. EXAM: CT ABDOMEN AND PELVIS WITHOUT CONTRAST TECHNIQUE: Multidetector CT imaging of the abdomen and pelvis was performed following the standard protocol without IV contrast. COMPARISON:  Prior CT from 07/21/2015. FINDINGS: Lower chest: Few scattered subcentimeter nodular densities within the right lower lobe measuring 5 mm are stable from prior, most likely benign. Scattered atelectatic changes present within the left lung base. Visualized lungs are otherwise clear. Prominent calcifications noted at the aortic valve. No pleural or pericardial effusion. Hepatobiliary: Limited noncontrast evaluation of the liver is grossly unremarkable. No evidence for cholelithiasis. No overt biliary dilatation. Pancreas: Pancreas grossly unremarkable without acute inflammatory changes. Spleen: Spleen within normal limits. Adrenals/Urinary Tract: Adrenal glands grossly unremarkable. Kidneys equal in measuring up to 8 mm, similar to prior, likely small proteinaceous and/ or hemorrhagic cysts. Peripherally calcified exophytic cyst at the upper pole of the right kidney measures 5.8 cm on today's exam. Bladder decompressed without acute abnormality. Stomach/Bowel: Stomach within normal limits. Multiple dilated loops of small bowel are seen within the upper and lower abdomen. These measure up to 4.4 cm in diameter and demonstrate associated internal air-fluid levels. The terminal ileum is decompressed distally. Findings compatible with mechanical small bowel obstruction. Exact transition point is difficult to discern on this noncontrast examination. Note is made of a left inguinal hernia containing fluid and a loop of bowel. This is appears to be a portion of the colon, not a small bowel loop. Colon  itself is of relatively normal caliber without acute abnormality. Additional fat containing ventral hernia noted more superiorly within the abdomen (series 2, image 24). Vascular/Lymphatic: Prominent aorto bi-iliac atherosclerotic  disease identified. Intra-abdominal aorta dilated at 3.4 cm. Node definite pathologically enlarged lymph nodes identified on this noncontrast examination. Reproductive: Uterus and ovaries not well seen, and may be absent. He should be it had he had Other: Moderate volume free fluid within the abdomen, likely reactive. No definite free intraperitoneal air. Musculoskeletal: Scoliosis with multilevel degenerative spondylolysis present. No acute osseous abnormality. No worrisome lytic or blastic osseous lesions. IMPRESSION: 1. Findings compatible with high-grade mechanical small bowel obstruction. Exact transition is difficult to discern on this examination, and not definitely identified. 2. Moderate volume free fluid within the abdomen, likely reactive. 3. Small fluid and bowel containing left inguinal hernia. This appears to contain a loop of colon, not small bowel, and is favored to not reflect the point of obstruction. 4. Stable 3.4 cm infrarenal abdominal aortic aneurysm. Recommend followup by ultrasound in 3 years. This recommendation follows ACR consensus guidelines: White Paper of the ACR Incidental Findings Committee II on Vascular Findings. [facility] Radiol 2013; 10:789-794 Electronically Signed   By: Rise Mu M.D.   On: 06/06/2016 00:44   Dg Vangie Bicker G Tube Plc W/fl-no Rad  Result Date: 06/06/2016 CLINICAL DATA:  NASO G TUBE PLACEMENT WITH FLUORO Fluoroscopy was utilized by the requesting physician.  No radiographic interpretation.   Prior to Admission medications   Medication Sig Start Date End Date Taking? Authorizing Provider  acetaminophen (TYLENOL) 325 MG tablet Take 2 tablets (650 mg total) by mouth every 4 (four) hours as needed for mild pain or moderate pain. 01/02/16  Yes Penny Pia, MD  Artificial Tear GEL Place 2 drops into both eyes 2 (two) times daily.   Yes Historical Provider, MD  aspirin EC 81 MG tablet Take 81 mg by mouth every morning.   Yes Historical Provider, MD   Cyanocobalamin (VITAMIN B-12) 2500 MCG SUBL Place 1 tablet under the tongue every morning.   Yes Historical Provider, MD  furosemide (LASIX) 40 MG tablet TAKE 1/2 TABLET (=20MG )    DAILY 05/22/16  Yes Gordy Savers, MD  isosorbide mononitrate (IMDUR) 30 MG 24 hr tablet Take 1 tablet (30 mg total) by mouth daily. 05/22/16  Yes Gordy Savers, MD  Multiple Vitamins-Minerals (PRESERVISION AREDS 2) CAPS Take 1 capsule by mouth daily. 10/06/15  Yes Gordy Savers, MD  nitroGLYCERIN (NITROSTAT) 0.4 MG SL tablet Place 1 tablet (0.4 mg total) under the tongue every 5 (five) minutes as needed for chest pain. 04/20/16  Yes Gordy Savers, MD  polyethylene glycol powder (GLYCOLAX/MIRALAX) powder MIX 17GM IN LIQUID AND     DRINK TWO TIMES A DAY 10/06/15  Yes Gordy Savers, MD  quinapril (ACCUPRIL) 40 MG tablet Take 0.5 tablets (20 mg total) by mouth daily. 05/22/16  Yes Gordy Savers, MD  amLODipine (NORVASC) 2.5 MG tablet Take 1 tablet (2.5 mg total) by mouth daily. Patient not taking: Reported on 06/05/2016 05/22/16   Gordy Savers, MD  feeding supplement, ENSURE ENLIVE, (ENSURE ENLIVE) LIQD Take 237 mLs by mouth 2 (two) times daily between meals. Patient not taking: Reported on 06/05/2016 01/02/16   Penny Pia, MD     Medications: . diatrizoate meglumine-sodium      . famotidine (PEPCID) IV  20 mg Intravenous Q12H  . heparin  5,000 Units Subcutaneous Q8H  . iopamidol      .  lidocaine      . sodium chloride flush  3 mL Intravenous Q12H   . sodium chloride 100 mL/hr at 06/06/16 1610   Anemia  Chronic kidney disease Hypertension Hx of diverticulosis  Assessment/Plan SBO with prior hysterectomy, Open cholecystectomy, and hernia repair FEN:  NPO/IV fluids ID: No antibiotics DVT: Heparin   It appears she does get the NG tube placed and they have already placed the Gastrografin she doesn't have a suction canister in the room so I doubt she has been on any NG  decompression so far. We'll follow and look for results of film later this afternoon.    LOS: 0 days    JENNINGS,WILLARD 06/06/2016 [phone]  Seen and agree with Will's assessment.  Wenda Low, MD

## 2016-06-06 NOTE — Progress Notes (Signed)
PROGRESS NOTE  Erin Gibson  ONG:[id number]:[id number] DOB: 08/29/1919 [name]: 06/05/2016 PCP: [name],[name], MD Outpatient Specialists:  Subjective: Seeing sitting at bedside, denies nausea or vomiting. Reported one bowel movement overnight. When I saw (around 9:30 AM) her there was no drainage in the suction canister.  Brief Narrative:  Erin Gibson is a charming 82 y.o. female with medical history significant for CKD stage III, hypertension, multiple abdominal surgeries, and recurrent SBO who presents to the ED for evaluation of abdominal pain, nausea, and non-bloody vomiting. Patient reports occasional, fleeting pains in her lower abdomen for a few days, but continued her usual diet without incident and had a normal bowel movement on the day of presentation. Later that evening, Ms. Bricco suffered a severe recurrence in the low-mid abdominal pain that was persistent. Nausea soon followed and she began vomiting. Pain, described as severe, crampy, band-like across mid-low abdomen, and without alleviating or exacerbating factors identified. She also developed central chest pain and dyspnea amidst all of this and took several SL nitroglycerin doses with relief in those symptoms. She reports some improvement in her symptoms by time of arrival and she attributes this to vomiting.    Assessment & Plan:   Principal Problem:   SBO (small bowel obstruction) (HCC) Active Problems:   Essential hypertension   GERD   Abdominal pain   CKD (chronic kidney disease), stage III   Chest pain   Protein-calorie malnutrition, severe   Patient seen earlier today by my colleague Dr. Ala BentP. This is a no charge note, seen the patient, examined her and reviewed the whole chart. Presented with nausea/vomiting, CT scan consistent with SBO.  1. SBO - Pt has hx multiple abd surgeries and recurrent SBO's resolving with conservative management  - Symptoms became severe several hrs prior to arrival in ED; she had  normal BM that am  - CT suggests high-grade mechanical SBO with indeterminate transition point  - There are active bowel sounds on admission, no flatus since onset of N/V - NGT to be placed  - Keep NPO; IVF hydration  - Pain-control, antiemetics prn   2. Chest pain  - Developed with the acute abd pain, N/V and resolved after she took multiple doses SL NTG pta - Initial EKG without acute ischemic features, troponin 0.00  - Monitor on telemetry for ischemic changes, obtain serial troponin measurements, repeat EKG in am and prn angina     3. CKD stage III  - SCr 1.23 on admission, up from baseline of ~1.1 - Currently NPO, receiving IVF with NS at 100 cc/hr  - Lasix and quinapril held given NPO status   - Repeat chem panel in am    4. Hypertension - At goal currently - Managed at home with amlodipine and quinapril - Will treat prn with IV hydralazine while NPO   5. GERD - Pt has hx of GERD and gastritis, not currently under treatment  - This could be source of her chest pain in light of initial cardiac w/u being neg so far and the SBO with N/V     DVT prophylaxis: SQ Heparin Code Status: DNR Family Communication:  Disposition Plan:  Diet: Diet NPO time specified Except for: Ice Chips  Consultants:   Gen Surgery  Procedures:   None  Antimicrobials:   None  Objective: Vitals:   06/06/16 0245 06/06/16 0248 06/06/16 0325 06/06/16 1341  BP: 153/59  (!) 167/56 (!) 182/52  Pulse: (!) 55  (!) 51 (!) 53  Resp:  13  17 16   Temp:  97.8 F (36.6 C) 98.2 F (36.8 C) 98.1 F (36.7 C)  TempSrc:  Oral Oral Oral  SpO2: 99%  97% 97%  Weight:   52.2 kg (115 lb 1.3 oz)   Height:   4' 10.5" (1.486 m)     Intake/Output Summary (Last 24 hours) at 06/06/16 1511 Last data filed at 06/06/16 0430  Gross per 24 hour  Intake                0 ml  Output              650 ml  Net             -650 ml   Filed Weights   06/06/16 0325  Weight: 52.2 kg (115 lb 1.3 oz)     Examination: General exam: Appears calm and comfortable  Respiratory system: Clear to auscultation. Respiratory effort normal. Cardiovascular system: S1 & S2 heard, RRR. No JVD, murmurs, rubs, gallops or clicks. No pedal edema. Gastrointestinal system: Abdomen is nondistended, soft and nontender. No organomegaly or masses felt. Normal bowel sounds heard. Central nervous system: Alert and oriented. No focal neurological deficits. Extremities: Symmetric 5 x 5 power. Skin: No rashes, lesions or ulcers Psychiatry: Judgement and insight appear normal. Mood & affect appropriate.   Data Reviewed: I have personally reviewed following labs and imaging studies  CBC:  Recent Labs Lab 06/05/16 2125  WBC 10.4  HGB 12.4  HCT 39.3  MCV 88.5  PLT 241   Basic Metabolic Panel:  Recent Labs Lab 06/05/16 2125 06/06/16 0827  NA 140 139  K 4.5 4.4  CL 110 109  CO2 24 22  GLUCOSE 120* 90  BUN 31* 30*  CREATININE 1.23* 1.18*  CALCIUM 8.8* 9.0   GFR: Estimated Creatinine Clearance: 20.8 mL/min (by C-G formula based on SCr of 1.18 mg/dL (H)). Liver Function Tests:  Recent Labs Lab 06/05/16 2125  AST 21  ALT 11*  ALKPHOS 99  BILITOT 0.6  PROT 5.9*  ALBUMIN 3.2*    Recent Labs Lab 06/05/16 2125  LIPASE 41   No results for input(s): AMMONIA in the last 168 hours. Coagulation Profile: No results for input(s): INR, PROTIME in the last 168 hours. Cardiac Enzymes:  Recent Labs Lab 06/06/16 0355 06/06/16 0827 06/06/16 1334  TROPONINI <0.03 <0.03 <0.03   BNP (last 3 results)  Recent Labs  02/16/16 1346  PROBNP 312.0*   HbA1C: No results for input(s): HGBA1C in the last 72 hours. CBG: No results for input(s): GLUCAP in the last 168 hours. Lipid Profile: No results for input(s): CHOL, HDL, LDLCALC, TRIG, CHOLHDL, LDLDIRECT in the last 72 hours. Thyroid Function Tests: No results for input(s): TSH, T4TOTAL, FREET4, T3FREE, THYROIDAB in the last 72 hours. Anemia  Panel: No results for input(s): VITAMINB12, FOLATE, FERRITIN, TIBC, IRON, RETICCTPCT in the last 72 hours. Urine analysis:    Component Value Date/Time   COLORURINE YELLOW 06/05/2016 2245   APPEARANCEUR CLOUDY (A) 06/05/2016 2245   LABSPEC 1.015 06/05/2016 2245   PHURINE 5.0 06/05/2016 2245   GLUCOSEU NEGATIVE 06/05/2016 2245   HGBUR TRACE (A) 06/05/2016 2245   BILIRUBINUR NEGATIVE 06/05/2016 2245   KETONESUR NEGATIVE 06/05/2016 2245   PROTEINUR NEGATIVE 06/05/2016 2245   UROBILINOGEN 0.2 07/21/2015 2225   NITRITE POSITIVE (A) 06/05/2016 2245   LEUKOCYTESUR TRACE (A) 06/05/2016 2245   Sepsis Labs: @LABRCNTIP (procalcitonin:4,lacticidven:4)  )No results found for this or any previous visit (from the past 240  hour(s)).   Invalid input(s): PROCALCITONIN, LACTICACIDVEN   Radiology Studies: Ct Abdomen Pelvis Wo Contrast  Result Date: 06/06/2016 CLINICAL DATA:  Initial evaluation for acute abdominal pain with vomiting. Concern for possible small bowel obstruction. EXAM: CT ABDOMEN AND PELVIS WITHOUT CONTRAST TECHNIQUE: Multidetector CT imaging of the abdomen and pelvis was performed following the standard protocol without IV contrast. COMPARISON:  Prior CT from 07/21/2015. FINDINGS: Lower chest: Few scattered subcentimeter nodular densities within the right lower lobe measuring 5 mm are stable from prior, most likely benign. Scattered atelectatic changes present within the left lung base. Visualized lungs are otherwise clear. Prominent calcifications noted at the aortic valve. No pleural or pericardial effusion. Hepatobiliary: Limited noncontrast evaluation of the liver is grossly unremarkable. No evidence for cholelithiasis. No overt biliary dilatation. Pancreas: Pancreas grossly unremarkable without acute inflammatory changes. Spleen: Spleen within normal limits. Adrenals/Urinary Tract: Adrenal glands grossly unremarkable. Kidneys equal in measuring up to 8 mm, similar to prior, likely small  proteinaceous and/ or hemorrhagic cysts. Peripherally calcified exophytic cyst at the upper pole of the right kidney measures 5.8 cm on today's exam. Bladder decompressed without acute abnormality. Stomach/Bowel: Stomach within normal limits. Multiple dilated loops of small bowel are seen within the upper and lower abdomen. These measure up to 4.4 cm in diameter and demonstrate associated internal air-fluid levels. The terminal ileum is decompressed distally. Findings compatible with mechanical small bowel obstruction. Exact transition point is difficult to discern on this noncontrast examination. Note is made of a left inguinal hernia containing fluid and a loop of bowel. This is appears to be a portion of the colon, not a small bowel loop. Colon itself is of relatively normal caliber without acute abnormality. Additional fat containing ventral hernia noted more superiorly within the abdomen (series 2, image 24). Vascular/Lymphatic: Prominent aorto bi-iliac atherosclerotic disease identified. Intra-abdominal aorta dilated at 3.4 cm. Node definite pathologically enlarged lymph nodes identified on this noncontrast examination. Reproductive: Uterus and ovaries not well seen, and may be absent. He should be it had he had Other: Moderate volume free fluid within the abdomen, likely reactive. No definite free intraperitoneal air. Musculoskeletal: Scoliosis with multilevel degenerative spondylolysis present. No acute osseous abnormality. No worrisome lytic or blastic osseous lesions. IMPRESSION: 1. Findings compatible with high-grade mechanical small bowel obstruction. Exact transition is difficult to discern on this examination, and not definitely identified. 2. Moderate volume free fluid within the abdomen, likely reactive. 3. Small fluid and bowel containing left inguinal hernia. This appears to contain a loop of colon, not small bowel, and is favored to not reflect the point of obstruction. 4. Stable 3.4 cm infrarenal  abdominal aortic aneurysm. Recommend followup by ultrasound in 3 years. This recommendation follows ACR consensus guidelines: White Paper of the ACR Incidental Findings Committee II on Vascular Findings. [facility] Coll Radiol 2013; 10:789-794 Electronically Signed   By: Rise Mu M.D.   On: 06/06/2016 00:44   Dg Abd 1 View  Result Date: 06/06/2016 CLINICAL DATA:  Nasogastric tube placement for small bowel obstruction. EXAM: ABDOMEN - 1 VIEW COMPARISON:  CT 06/05/2016. Fluoroscopy Time:  1 minutes and 54 seconds Radiation Exposure Index (if provided by the fluoroscopic device): 7.2 mGy Number of Acquired Spot Images: 0 FINDINGS: Nasogastric tube placement was performed by the radiology technologist. A 14 French nasogastric tube was placed. 90 ml of Gastrografin was utilized to aid placement of the nasogastric tube. A single spot image after tube placement demonstrates the tip of the tube in the left  upper quadrant of the abdomen. IMPRESSION: Nasogastric tube placement under fluoroscopy. Electronically Signed   By: Carey Bullocks M.D.   On: 06/06/2016 08:40   Dg Vangie Bicker G Tube Plc W/fl-no Rad  Result Date: 06/06/2016 CLINICAL DATA:  NASO G TUBE PLACEMENT WITH FLUORO Fluoroscopy was utilized by the requesting physician.  No radiographic interpretation.        Scheduled Meds: . [START ON 06/07/2016] famotidine (PEPCID) IV  20 mg Intravenous Q24H  . heparin  5,000 Units Subcutaneous Q8H  . mouth rinse  15 mL Mouth Rinse BID  . sodium chloride flush  3 mL Intravenous Q12H   Continuous Infusions: . dextrose 5 % and 0.45% NaCl 1,000 mL infusion 75 mL/hr at 06/06/16 1454     LOS: 0 days    Time spent: 35 minutes    Jerica Creegan A, MD [facility] Pager [phone]  If 7PM-7AM, please contact night-coverage [email or web address] Password [email or web address] 06/06/2016, 3:11 PM

## 2016-06-06 NOTE — Consult Note (Signed)
Reason for Consult:sbo Referring Physician: Dr Alfonse Alpers is an 82 y.o. female.  HPI: 11 yof with multiple prior abdominal surgeries presents with a couple days of abdominal pain that progressed during day today to not being relieved by anything. She feels very bloated. Not passing flatus, has had bm, no fevers, n/v are occurring.  She was seen in er and looks by ct to have sbo and we were consulted.   Past Medical History:  Diagnosis Date  . ANEMIA 03/31/2009  . Chronic kidney disease   . DEGENERATIVE JOINT DISEASE, GENERALIZED 05/06/2007  . Diverticulosis of sigmoid colon    Colonoscopy 2011  . DVT (deep venous thrombosis) (HCC)    hx of  . GERD 04/24/2007  . HYPERTENSION 04/24/2007  . VENOUS STASIS ULCER 07/20/2008    Past Surgical History:  Procedure Laterality Date  . ABDOMINAL HYSTERECTOMY     Completion hysterectomy  . CATARACT EXTRACTION    . CHOLECYSTECTOMY OPEN    . HERNIA REPAIR    . PARTIAL HYSTERECTOMY     Salpingo-oophorectomy as well  . TOTAL KNEE ARTHROPLASTY     x2    History reviewed. No pertinent family history.  Social History:  reports that she has never smoked. She has never used smokeless tobacco. She reports that she does not drink alcohol or use drugs.  Allergies:  Allergies  Allergen Reactions  . Tramadol Other (See Comments)    Auditory and visual hallucinations  . Codeine Nausea And Vomiting    Violently ill  . Ibuprofen Nausea And Vomiting  . Diltiazem Hcl Other (See Comments)    Not sure about this reaction    Medications: I have reviewed the patient's current medications.  Results for orders placed or performed during the hospital encounter of 06/05/16 (from the past 48 hour(s))  Lipase, blood     Status: None   Collection Time: 06/05/16  9:25 PM  Result Value Ref Range   Lipase 41 11 - 51 U/L  Comprehensive metabolic panel     Status: Abnormal   Collection Time: 06/05/16  9:25 PM  Result Value Ref Range   Sodium 140 135 -  145 mmol/L   Potassium 4.5 3.5 - 5.1 mmol/L   Chloride 110 101 - 111 mmol/L   CO2 24 22 - 32 mmol/L   Glucose, Bld 120 (H) 65 - 99 mg/dL   BUN 31 (H) 6 - 20 mg/dL   Creatinine, Ser 1.23 (H) 0.44 - 1.00 mg/dL   Calcium 8.8 (L) 8.9 - 10.3 mg/dL   Total Protein 5.9 (L) 6.5 - 8.1 g/dL   Albumin 3.2 (L) 3.5 - 5.0 g/dL   AST 21 15 - 41 U/L   ALT 11 (L) 14 - 54 U/L   Alkaline Phosphatase 99 38 - 126 U/L   Total Bilirubin 0.6 0.3 - 1.2 mg/dL   GFR calc non Af Amer 36 (L) >60 mL/min   GFR calc Af Amer 42 (L) >60 mL/min    Comment: (NOTE) The eGFR has been calculated using the CKD EPI equation. This calculation has not been validated in all clinical situations. eGFR's persistently <60 mL/min signify possible Chronic Kidney Disease.    Anion gap 6 5 - 15  CBC     Status: None   Collection Time: 06/05/16  9:25 PM  Result Value Ref Range   WBC 10.4 4.0 - 10.5 K/uL   RBC 4.44 3.87 - 5.11 MIL/uL   Hemoglobin 12.4 12.0 - 15.0  g/dL   HCT 39.3 36.0 - 46.0 %   MCV 88.5 78.0 - 100.0 fL   MCH 27.9 26.0 - 34.0 pg   MCHC 31.6 30.0 - 36.0 g/dL   RDW 14.2 11.5 - 15.5 %   Platelets 241 150 - 400 K/uL  I-Stat Troponin, ED (not at [facility])     Status: None   Collection Time: 06/05/16  9:34 PM  Result Value Ref Range   Troponin i, poc 0.00 0.00 - 0.08 ng/mL   Comment 3            Comment: Due to the release kinetics of cTnI, a negative result within the first hours of the onset of symptoms does not rule out myocardial infarction with certainty. If myocardial infarction is still suspected, repeat the test at appropriate intervals.   Urinalysis, Routine w reflex microscopic     Status: Abnormal   Collection Time: 06/05/16 10:45 PM  Result Value Ref Range   Color, Urine YELLOW YELLOW   APPearance CLOUDY (A) CLEAR   Specific Gravity, Urine 1.015 1.005 - 1.030   pH 5.0 5.0 - 8.0   Glucose, UA NEGATIVE NEGATIVE mg/dL   Hgb urine dipstick TRACE (A) NEGATIVE   Bilirubin Urine NEGATIVE NEGATIVE    Ketones, ur NEGATIVE NEGATIVE mg/dL   Protein, ur NEGATIVE NEGATIVE mg/dL   Nitrite POSITIVE (A) NEGATIVE   Leukocytes, UA TRACE (A) NEGATIVE  Urine microscopic-add on     Status: Abnormal   Collection Time: 06/05/16 10:45 PM  Result Value Ref Range   Squamous Epithelial / LPF 0-5 (A) NONE SEEN   WBC, UA 6-30 0 - 5 WBC/hpf   RBC / HPF 0-5 0 - 5 RBC/hpf   Bacteria, UA MANY (A) NONE SEEN    Ct Abdomen Pelvis Wo Contrast  Result Date: 06/06/2016 CLINICAL DATA:  Initial evaluation for acute abdominal pain with vomiting. Concern for possible small bowel obstruction. EXAM: CT ABDOMEN AND PELVIS WITHOUT CONTRAST TECHNIQUE: Multidetector CT imaging of the abdomen and pelvis was performed following the standard protocol without IV contrast. COMPARISON:  Prior CT from 07/21/2015. FINDINGS: Lower chest: Few scattered subcentimeter nodular densities within the right lower lobe measuring 5 mm are stable from prior, most likely benign. Scattered atelectatic changes present within the left lung base. Visualized lungs are otherwise clear. Prominent calcifications noted at the aortic valve. No pleural or pericardial effusion. Hepatobiliary: Limited noncontrast evaluation of the liver is grossly unremarkable. No evidence for cholelithiasis. No overt biliary dilatation. Pancreas: Pancreas grossly unremarkable without acute inflammatory changes. Spleen: Spleen within normal limits. Adrenals/Urinary Tract: Adrenal glands grossly unremarkable. Kidneys equal in measuring up to 8 mm, similar to prior, likely small proteinaceous and/ or hemorrhagic cysts. Peripherally calcified exophytic cyst at the upper pole of the right kidney measures 5.8 cm on today's exam. Bladder decompressed without acute abnormality. Stomach/Bowel: Stomach within normal limits. Multiple dilated loops of small bowel are seen within the upper and lower abdomen. These measure up to 4.4 cm in diameter and demonstrate associated internal air-fluid levels.  The terminal ileum is decompressed distally. Findings compatible with mechanical small bowel obstruction. Exact transition point is difficult to discern on this noncontrast examination. Note is made of a left inguinal hernia containing fluid and a loop of bowel. This is appears to be a portion of the colon, not a small bowel loop. Colon itself is of relatively normal caliber without acute abnormality. Additional fat containing ventral hernia noted more superiorly within the abdomen (series 2, image  24). Vascular/Lymphatic: Prominent aorto bi-iliac atherosclerotic disease identified. Intra-abdominal aorta dilated at 3.4 cm. Node definite pathologically enlarged lymph nodes identified on this noncontrast examination. Reproductive: Uterus and ovaries not well seen, and may be absent. He should be it had he had Other: Moderate volume free fluid within the abdomen, likely reactive. No definite free intraperitoneal air. Musculoskeletal: Scoliosis with multilevel degenerative spondylolysis present. No acute osseous abnormality. No worrisome lytic or blastic osseous lesions. IMPRESSION: 1. Findings compatible with high-grade mechanical small bowel obstruction. Exact transition is difficult to discern on this examination, and not definitely identified. 2. Moderate volume free fluid within the abdomen, likely reactive. 3. Small fluid and bowel containing left inguinal hernia. This appears to contain a loop of colon, not small bowel, and is favored to not reflect the point of obstruction. 4. Stable 3.4 cm infrarenal abdominal aortic aneurysm. Recommend followup by ultrasound in 3 years. This recommendation follows ACR consensus guidelines: White Paper of the ACR Incidental Findings Committee II on Vascular Findings. Natasha Mead Coll Radiol 2013; 10:789-794 Electronically Signed   By: Jeannine Boga M.D.   On: 06/06/2016 00:44    Review of Systems  Constitutional: Negative for chills and fever.  Cardiovascular: Negative for  chest pain.  Gastrointestinal: Positive for abdominal pain, constipation, nausea and vomiting.   Blood pressure 134/55, pulse 70, temperature 98.2 F (36.8 C), temperature source Oral, resp. rate 21, SpO2 94 %. Physical Exam  Vitals reviewed. Constitutional: She appears well-developed and well-nourished.  HENT:  Head: Normocephalic.  Eyes: No scleral icterus.  Neck: Neck supple.  Cardiovascular: Normal rate, regular rhythm and normal heart sounds.   Respiratory: Effort normal. She has no wheezes. She has no rales.  GI: She exhibits distension. Bowel sounds are decreased. There is no tenderness. A hernia is present. Hernia confirmed positive in the ventral area and confirmed positive in the left inguinal area (easily reducible and nontender).    Assessment/Plan: sbo likely adhesive, does not appear to be hernia related Admission, ivf, npo, ngt small bowel protocol and hopefully will resolve conservatively  Erin Gibson 06/06/2016, 2:01 AM

## 2016-06-06 NOTE — H&P (Addendum)
History and Physical    Erin Gibson ZOX:[id number] DOB: May 10, 1919 [name]: 06/05/2016  PCP: Rogelia Boga, MD   Patient coming from: Home  Chief Complaint: Abdominal pain, nausea, vomiting  HPI: Erin Gibson is a charming 82 y.o. female with medical history significant for CKD stage III, hypertension, multiple abdominal surgeries, and recurrent SBO who presents to the ED for evaluation of abdominal pain, nausea, and non-bloody vomiting. Patient reports occasional, fleeting pains in her lower abdomen for a few days, but continued her usual diet without incident and had a normal bowel movement on the day of presentation. Later that evening, Ms. Balis suffered a severe recurrence in the low-mid abdominal pain that was persistent. Nausea soon followed and she began vomiting. Pain, described as severe, crampy, band-like across mid-low abdomen, and without alleviating or exacerbating factors identified. She also developed central chest pain and dyspnea amidst all of this and took several SL nitroglycerin doses with relief in those symptoms. She reports some improvement in her symptoms by time of arrival and she attributes this to vomiting.   ED Course: Upon arrival to the ED, patient is found to be afebrile, saturating well on room air, and with vitals stable. Pertinent labs include serum creatinine 1.23, up from apparent baseline of ~1.1, troponin of 0.00. CBC is normal. UA is suggestive of possible infection with bacteria, trace leukocytes and positive nitrite. CT abdomen/pelvis findings are consistent with a SBO. Surgery consultation was requested by the ED physician and orders were placed for NGT. Patient remained hemodynamically stable, had no recurrence in anginal symptoms, and will be admitted to the telemetry unit for further evaluation and management of SBO and chest pain.   Review of Systems:  All other systems reviewed and apart from HPI, are negative.  Past Medical History:    Diagnosis Date  . ANEMIA 03/31/2009  . Chronic kidney disease   . DEGENERATIVE JOINT DISEASE, GENERALIZED 05/06/2007  . Diverticulosis of sigmoid colon    Colonoscopy 2011  . DVT (deep venous thrombosis) (HCC)    hx of  . GERD 04/24/2007  . HYPERTENSION 04/24/2007  . VENOUS STASIS ULCER 07/20/2008    Past Surgical History:  Procedure Laterality Date  . ABDOMINAL HYSTERECTOMY     Completion hysterectomy  . CATARACT EXTRACTION    . CHOLECYSTECTOMY OPEN    . HERNIA REPAIR    . PARTIAL HYSTERECTOMY     Salpingo-oophorectomy as well  . TOTAL KNEE ARTHROPLASTY     x2     reports that she has never smoked. She has never used smokeless tobacco. She reports that she does not drink alcohol or use drugs.  Allergies  Allergen Reactions  . Tramadol Other (See Comments)    Auditory and visual hallucinations  . Codeine Nausea And Vomiting    Violently ill  . Ibuprofen Nausea And Vomiting  . Diltiazem Hcl Other (See Comments)    Not sure about this reaction    History reviewed. No pertinent family history.   Prior to Admission medications   Medication Sig Start Date End Date Taking? Authorizing Provider  acetaminophen (TYLENOL) 325 MG tablet Take 2 tablets (650 mg total) by mouth every 4 (four) hours as needed for mild pain or moderate pain. 01/02/16  Yes Penny Pia, MD  Artificial Tear GEL Place 2 drops into both eyes 2 (two) times daily.   Yes Historical Provider, MD  aspirin EC 81 MG tablet Take 81 mg by mouth every morning.   Yes Historical Provider, MD  Cyanocobalamin (VITAMIN B-12) 2500 MCG SUBL Place 1 tablet under the tongue every morning.   Yes Historical Provider, MD  furosemide (LASIX) 40 MG tablet TAKE 1/2 TABLET (=20MG )    DAILY 05/22/16  Yes Gordy Savers, MD  isosorbide mononitrate (IMDUR) 30 MG 24 hr tablet Take 1 tablet (30 mg total) by mouth daily. 05/22/16  Yes Gordy Savers, MD  Multiple Vitamins-Minerals (PRESERVISION AREDS 2) CAPS Take 1 capsule by  mouth daily. 10/06/15  Yes Gordy Savers, MD  nitroGLYCERIN (NITROSTAT) 0.4 MG SL tablet Place 1 tablet (0.4 mg total) under the tongue every 5 (five) minutes as needed for chest pain. 04/20/16  Yes Gordy Savers, MD  polyethylene glycol powder (GLYCOLAX/MIRALAX) powder MIX 17GM IN LIQUID AND     DRINK TWO TIMES A DAY 10/06/15  Yes Gordy Savers, MD  quinapril (ACCUPRIL) 40 MG tablet Take 0.5 tablets (20 mg total) by mouth daily. 05/22/16  Yes Gordy Savers, MD  amLODipine (NORVASC) 2.5 MG tablet Take 1 tablet (2.5 mg total) by mouth daily. Patient not taking: Reported on 06/05/2016 05/22/16   Gordy Savers, MD  feeding supplement, ENSURE ENLIVE, (ENSURE ENLIVE) LIQD Take 237 mLs by mouth 2 (two) times daily between meals. Patient not taking: Reported on 06/05/2016 01/02/16   Penny Pia, MD    Physical Exam: Vitals:   06/05/16 2230 06/05/16 2300 06/06/16 0115 06/06/16 0130  BP: 132/71 139/69 137/70 134/55  Pulse: 69 68 61 70  Resp: 19 19 19 21   Temp:      TempSrc:      SpO2: 96% 97% 96% 94%      Constitutional: NAD, calm, comfortable Eyes: PERTLA, lids and conjunctivae normal ENMT: Mucous membranes are moist. Posterior pharynx clear of any exudate or lesions.   Neck: normal, supple, no masses, no thyromegaly Respiratory: clear to auscultation bilaterally, no wheezing, no crackles. Normal respiratory effort.    Cardiovascular: S1 & S2 heard, regular rate and rhythm. No significant JVD. Abdomen: Bowel sounds active. No distension, tender across mid-abdomen without rebound pain or guarding, no masses palpated.  Musculoskeletal: no clubbing / cyanosis. No acute joint deformity upper and lower extremities. Normal muscle tone.  Skin: no significant rashes, lesions, ulcers. Warm, dry, well-perfused. Neurologic: CN 2-12 grossly intact. Sensation intact, DTR normal. Strength 5/5 in all 4 limbs.  Psychiatric: Normal judgment and insight. Alert and oriented x 3. Normal  mood and affect.     Labs on Admission: I have personally reviewed following labs and imaging studies  CBC:  Recent Labs Lab 06/05/16 2125  WBC 10.4  HGB 12.4  HCT 39.3  MCV 88.5  PLT 241   Basic Metabolic Panel:  Recent Labs Lab 06/05/16 2125  NA 140  K 4.5  CL 110  CO2 24  GLUCOSE 120*  BUN 31*  CREATININE 1.23*  CALCIUM 8.8*   GFR: CrCl cannot be calculated (Unknown ideal weight.). Liver Function Tests:  Recent Labs Lab 06/05/16 2125  AST 21  ALT 11*  ALKPHOS 99  BILITOT 0.6  PROT 5.9*  ALBUMIN 3.2*    Recent Labs Lab 06/05/16 2125  LIPASE 41   No results for input(s): AMMONIA in the last 168 hours. Coagulation Profile: No results for input(s): INR, PROTIME in the last 168 hours. Cardiac Enzymes: No results for input(s): CKTOTAL, CKMB, CKMBINDEX, TROPONINI in the last 168 hours. BNP (last 3 results)  Recent Labs  02/16/16 1346  PROBNP 312.0*   HbA1C: No results for input(s):  HGBA1C in the last 72 hours. CBG: No results for input(s): GLUCAP in the last 168 hours. Lipid Profile: No results for input(s): CHOL, HDL, LDLCALC, TRIG, CHOLHDL, LDLDIRECT in the last 72 hours. Thyroid Function Tests: No results for input(s): TSH, T4TOTAL, FREET4, T3FREE, THYROIDAB in the last 72 hours. Anemia Panel: No results for input(s): VITAMINB12, FOLATE, FERRITIN, TIBC, IRON, RETICCTPCT in the last 72 hours. Urine analysis:    Component Value Date/Time   COLORURINE YELLOW 06/05/2016 2245   APPEARANCEUR CLOUDY (A) 06/05/2016 2245   LABSPEC 1.015 06/05/2016 2245   PHURINE 5.0 06/05/2016 2245   GLUCOSEU NEGATIVE 06/05/2016 2245   HGBUR TRACE (A) 06/05/2016 2245   BILIRUBINUR NEGATIVE 06/05/2016 2245   KETONESUR NEGATIVE 06/05/2016 2245   PROTEINUR NEGATIVE 06/05/2016 2245   UROBILINOGEN 0.2 07/21/2015 2225   NITRITE POSITIVE (A) 06/05/2016 2245   LEUKOCYTESUR TRACE (A) 06/05/2016 2245   Sepsis Labs: @LABRCNTIP (procalcitonin:4,lacticidven:4) )No  results found for this or any previous visit (from the past 240 hour(s)).   Radiological Exams on Admission: Ct Abdomen Pelvis Wo Contrast  Result Date: 06/06/2016 CLINICAL DATA:  Initial evaluation for acute abdominal pain with vomiting. Concern for possible small bowel obstruction. EXAM: CT ABDOMEN AND PELVIS WITHOUT CONTRAST TECHNIQUE: Multidetector CT imaging of the abdomen and pelvis was performed following the standard protocol without IV contrast. COMPARISON:  Prior CT from 07/21/2015. FINDINGS: Lower chest: Few scattered subcentimeter nodular densities within the right lower lobe measuring 5 mm are stable from prior, most likely benign. Scattered atelectatic changes present within the left lung base. Visualized lungs are otherwise clear. Prominent calcifications noted at the aortic valve. No pleural or pericardial effusion. Hepatobiliary: Limited noncontrast evaluation of the liver is grossly unremarkable. No evidence for cholelithiasis. No overt biliary dilatation. Pancreas: Pancreas grossly unremarkable without acute inflammatory changes. Spleen: Spleen within normal limits. Adrenals/Urinary Tract: Adrenal glands grossly unremarkable. Kidneys equal in measuring up to 8 mm, similar to prior, likely small proteinaceous and/ or hemorrhagic cysts. Peripherally calcified exophytic cyst at the upper pole of the right kidney measures 5.8 cm on today's exam. Bladder decompressed without acute abnormality. Stomach/Bowel: Stomach within normal limits. Multiple dilated loops of small bowel are seen within the upper and lower abdomen. These measure up to 4.4 cm in diameter and demonstrate associated internal air-fluid levels. The terminal ileum is decompressed distally. Findings compatible with mechanical small bowel obstruction. Exact transition point is difficult to discern on this noncontrast examination. Note is made of a left inguinal hernia containing fluid and a loop of bowel. This is appears to be a  portion of the colon, not a small bowel loop. Colon itself is of relatively normal caliber without acute abnormality. Additional fat containing ventral hernia noted more superiorly within the abdomen (series 2, image 24). Vascular/Lymphatic: Prominent aorto bi-iliac atherosclerotic disease identified. Intra-abdominal aorta dilated at 3.4 cm. Node definite pathologically enlarged lymph nodes identified on this noncontrast examination. Reproductive: Uterus and ovaries not well seen, and may be absent. He should be it had he had Other: Moderate volume free fluid within the abdomen, likely reactive. No definite free intraperitoneal air. Musculoskeletal: Scoliosis with multilevel degenerative spondylolysis present. No acute osseous abnormality. No worrisome lytic or blastic osseous lesions. IMPRESSION: 1. Findings compatible with high-grade mechanical small bowel obstruction. Exact transition is difficult to discern on this examination, and not definitely identified. 2. Moderate volume free fluid within the abdomen, likely reactive. 3. Small fluid and bowel containing left inguinal hernia. This appears to contain a loop of colon,  not small bowel, and is favored to not reflect the point of obstruction. 4. Stable 3.4 cm infrarenal abdominal aortic aneurysm. Recommend followup by ultrasound in 3 years. This recommendation follows ACR consensus guidelines: White Paper of the ACR Incidental Findings Committee II on Vascular Findings. [facility] Coll Radiol 2013; 10:789-794 Electronically Signed   By: Rise Mu M.D.   On: 06/06/2016 00:44    EKG: Independently reviewed. Sinus rhythm, LaFB  Assessment/Plan  1. SBO - Pt has hx multiple abd surgeries and recurrent SBO's resolving with conservative management  - Symptoms became severe several hrs prior to arrival in ED; she had normal BM that am  - CT suggests high-grade mechanical SBO with indeterminate transition point  - There are active bowel sounds on  admission, no flatus since onset of N/V - NGT to be placed  - Keep NPO; IVF hydration  - Pain-control, antiemetics prn   2. Chest pain  - Developed with the acute abd pain, N/V and resolved after she took multiple doses SL NTG pta - Initial EKG without acute ischemic features, troponin 0.00  - Monitor on telemetry for ischemic changes, obtain serial troponin measurements, repeat EKG in am and prn angina     3. CKD stage III  - SCr 1.23 on admission, up from baseline of ~1.1 - Currently NPO, receiving IVF with NS at 100 cc/hr  - Lasix and quinapril held given NPO status   - Repeat chem panel in am    4. Hypertension - At goal currently - Managed at home with amlodipine and quinapril - Will treat prn with IV hydralazine while NPO   5. GERD - Pt has hx of GERD and gastritis, not currently under treatment  - This could be source of her chest pain in light of initial cardiac w/u being neg so far and the SBO with N/V  - Will treat with IV Pepcid BID for now while NPO     DVT prophylaxis: sq heparin Code Status: DNR  Family Communication: Daughter and friend updated at bedside  Disposition Plan: Observe on telemetry Consults called: Surgery Admission status: Observation    Briscoe Deutscher, MD [facility] Pager [phone]  If 7PM-7AM, please contact night-coverage [email or web address] Password [email or web address]  06/06/2016, 1:41 AM

## 2016-06-06 NOTE — ED Notes (Signed)
Dr Dwain SarnaWakefield in with patient.

## 2016-06-07 DIAGNOSIS — E43 Unspecified severe protein-calorie malnutrition: Secondary | ICD-10-CM

## 2016-06-07 DIAGNOSIS — N183 Chronic kidney disease, stage 3 (moderate): Secondary | ICD-10-CM

## 2016-06-07 DIAGNOSIS — I10 Essential (primary) hypertension: Secondary | ICD-10-CM

## 2016-06-07 DIAGNOSIS — R1013 Epigastric pain: Secondary | ICD-10-CM

## 2016-06-07 DIAGNOSIS — K219 Gastro-esophageal reflux disease without esophagitis: Secondary | ICD-10-CM

## 2016-06-07 LAB — BASIC METABOLIC PANEL
Anion gap: 5 (ref 5–15)
BUN: 17 mg/dL (ref 6–20)
CO2: 26 mmol/L (ref 22–32)
Calcium: 9.1 mg/dL (ref 8.9–10.3)
Chloride: 111 mmol/L (ref 101–111)
Creatinine, Ser: 1.08 mg/dL — ABNORMAL HIGH (ref 0.44–1.00)
GFR calc Af Amer: 49 mL/min — ABNORMAL LOW (ref >60)
GFR, EST NON AFRICAN AMERICAN: 42 mL/min — AB (ref >60)
GLUCOSE: 104 mg/dL — AB (ref 65–99)
POTASSIUM: 3.2 mmol/L — AB (ref 3.5–5.1)
Sodium: 142 mmol/L (ref 135–145)

## 2016-06-07 LAB — CBC
HEMATOCRIT: 34 % — AB (ref 36.0–46.0)
Hemoglobin: 10.7 g/dL — ABNORMAL LOW (ref 12.0–15.0)
MCH: 27.7 pg (ref 26.0–34.0)
MCHC: 31.5 g/dL (ref 30.0–36.0)
MCV: 88.1 fL (ref 78.0–100.0)
Platelets: 194 10*3/uL (ref 150–400)
RBC: 3.86 MIL/uL — ABNORMAL LOW (ref 3.87–5.11)
RDW: 14.2 % (ref 11.5–15.5)
WBC: 5.7 10*3/uL (ref 4.0–10.5)

## 2016-06-07 MED ORDER — DEXTROSE-NACL 5-0.45 % IV SOLN
INTRAVENOUS | Status: DC
  Administered 2016-06-07: 11:00:00 via INTRAVENOUS
  Filled 2016-06-07 (×5): qty 1000

## 2016-06-07 MED ORDER — POTASSIUM CHLORIDE CRYS ER 20 MEQ PO TBCR
40.0000 meq | EXTENDED_RELEASE_TABLET | Freq: Once | ORAL | Status: AC
  Administered 2016-06-07: 40 meq via ORAL
  Filled 2016-06-07: qty 2

## 2016-06-07 MED ORDER — ARTIFICIAL TEARS OP OINT
TOPICAL_OINTMENT | Freq: Two times a day (BID) | OPHTHALMIC | Status: DC
  Administered 2016-06-07 – 2016-06-10 (×7): via OPHTHALMIC
  Filled 2016-06-07: qty 3.5

## 2016-06-07 NOTE — Evaluation (Signed)
Physical Therapy Evaluation Patient Details Name: Erin Gibson MRN: [id number] DOB: 10/14/1918 Today's Date: 06/07/2016   History of Present Illness  Pt is a 82 y.o. female admitted with abdominal pain, nausea and vomiting. Pt being treated for SBO. PMH: HTN, hx of DVT, CKD, anemia, TKA.   Clinical Impression  Pt presenting with decreased activity tolerance and stability with mobility. During PT session the pt had 2 losses of balance, X1 with standing and X1 during ambulation. Pt does admit to having multiple falls at home. Discussion was had regarding recommendation of going to SNF for further rehabilitation following her hospital stay but the pt was adamant that she was going to return home. Recommending SNF following acute stay but if pt continues to refuse, recommend HHPT services. PT to continue to follow.      Follow Up Recommendations Home health PT;SNF;Supervision for mobility/OOB- recommending SNF- HHPT and assist with mobility if pt continues to refuse recommendation.     Equipment Recommendations  None recommended by PT    Recommendations for Other Services       Precautions / Restrictions Precautions Precautions: Fall Precaution Comments: hx of falls at home Restrictions Weight Bearing Restrictions: No      Mobility  Bed Mobility Overal bed mobility: Needs Assistance Bed Mobility: Supine to Sit     Supine to sit: Supervision Sit to supine: Min assist   General bed mobility comments: supervision for safety  Transfers Overall transfer level: Needs assistance Equipment used: Rolling walker (2 wheeled) Transfers: Sit to/from Stand Sit to Stand: Min assist         General transfer comment: Multiple attempts needed and min assist to achieve standing. Cues needed for hand placement. Once standing, pt needing assistance to maintain balance intially with posterior loss of balance.   Ambulation/Gait Ambulation/Gait assistance: Min guard Ambulation Distance  (Feet): 40 Feet Assistive device: Rolling walker (2 wheeled) Gait Pattern/deviations: Step-through pattern;Trunk flexed;Decreased step length - right;Decreased step length - left Gait velocity: decreased   General Gait Details: Pt with 1 loss of balance posteriorly with assist needed to recover. Pt becoming fatigued by end of ambulation with noted shortness of breath.   Stairs            Wheelchair Mobility    Modified Rankin (Stroke Patients Only)       Balance Overall balance assessment: Needs assistance Sitting-balance support: No upper extremity supported Sitting balance-Leahy Scale: Fair     Standing balance support: Bilateral upper extremity supported Standing balance-Leahy Scale: Poor Standing balance comment: using rw and with loss of balance noted.                              Pertinent Vitals/Pain Pain Assessment: No/denies pain    Home Living Family/patient expects to be discharged to:: Private residence Living Arrangements: Alone Available Help at Discharge: Home health Type of Home: House Home Access: Stairs to enter   [employer] Corporation[employer]trance Stairs-Number of Steps: 1 (pt reports having 1 half step.) Home Layout: One level Home Equipment: Walker - 2 wheels;Bedside commode Additional Comments: Pt reports that she is going to be getting home health aides for 4 hours per day, 7 days a week.     Prior Function Level of Independence: Independent with assistive device(s)         Comments: reports using rw at home. Pt admits to having fallen several times with assist needed to get up  Hand Dominance   Dominant Hand: Right    Extremity/Trunk Assessment   Upper Extremity Assessment: Defer to OT evaluation           Lower Extremity Assessment: Generalized weakness      Cervical / Trunk Assessment: Kyphotic  Communication   Communication: No difficulties  Cognition Arousal/Alertness: Awake/alert Behavior During Therapy: WFL for tasks  assessed/performed Overall Cognitive Status: Within Functional Limits for tasks assessed                      General Comments      Exercises        Assessment/Plan    PT Assessment Patient needs continued PT services  PT Diagnosis Difficulty walking   PT Problem List Decreased strength;Decreased activity tolerance;Decreased balance;Decreased range of motion;Decreased mobility;Decreased safety awareness  PT Treatment Interventions DME instruction;Gait training;Stair training;Functional mobility training;Therapeutic activities;Therapeutic exercise;Balance training;Patient/family education   PT Goals (Current goals can be found in the Care Plan section) Acute Rehab PT Goals Patient Stated Goal: go home and get tube out of her nose PT Goal Formulation: With patient Time For Goal Achievement: 06/21/16 Potential to Achieve Goals: Fair    Frequency Min 3X/week   Barriers to discharge        Co-evaluation               End of Session Equipment Utilized During Treatment: Gait belt Activity Tolerance: Patient limited by fatigue Patient left: in chair;with call bell/phone within reach;with chair alarm set Nurse Communication: Mobility status         Time: 1610-9604 PT Time Calculation (min) (ACUTE ONLY): 28 min   Charges:   PT Evaluation $PT Eval Moderate Complexity: 1 Procedure PT Treatments $Gait Training: 8-22 mins   PT G Codes:        Christiane Ha, PT, CSCS Pager [phone] Office [phone]  06/07/2016, 2:40 PM

## 2016-06-07 NOTE — Progress Notes (Signed)
PROGRESS NOTE  Erin Gibson  ZOX:[id number] DOB: [birth year] [name]: 06/05/2016 PCP: Rogelia Boga, MD Outpatient Specialists:  Subjective: Had multiple bowel movements since yesterday, abdominal x-ray showed Gastrografin in the colon. Reported NG tube making her throat hurts.  Brief Narrative:  Erin Gibson is a charming 82 y.o. female with medical history significant for CKD stage III, hypertension, multiple abdominal surgeries, and recurrent SBO who presents to the ED for evaluation of abdominal pain, nausea, and non-bloody vomiting. Patient reports occasional, fleeting pains in her lower abdomen for a few days, but continued her usual diet without incident and had a normal bowel movement on the day of presentation. Later that evening, Erin Gibson suffered a severe recurrence in the low-mid abdominal pain that was persistent. Nausea soon followed and she began vomiting. Pain, described as severe, crampy, band-like across mid-low abdomen, and without alleviating or exacerbating factors identified. She also developed central chest pain and dyspnea amidst all of this and took several SL nitroglycerin doses with relief in those symptoms. She reports some improvement in her symptoms by time of arrival and she attributes this to vomiting.    Assessment & Plan:   Principal Problem:   SBO (small bowel obstruction) (HCC) Active Problems:   Essential hypertension   GERD   Abdominal pain   CKD (chronic kidney disease), stage III   Chest pain   Protein-calorie malnutrition, severe    1. SBO - Pt has hx multiple abd surgeries and recurrent SBO's resolving with conservative management  - Symptoms became severe several hrs prior to arrival in ED; she had normal BM that am  - CT suggests high-grade mechanical SBO with indeterminate transition point  - Treated her briefly with NG tube to low intermittent suction. -Repeat of abdominal x-ray showed contrast medium in the colon, had 4 bowel  movements since yesterday. -NG tube clamped today pending removal and advancing diet.  2. Chest pain  - Developed with the acute abd pain, N/V and resolved after she took multiple doses SL NTG pta - This is likely secondary to the recurrent vomiting, no evidence of ischemia per EKG and troponin.   3. CKD stage III  - SCr 1.23 on admission, up from baseline of ~1.1 - Currently NPO, receiving IVF with NS at 100 cc/hr  - Lasix and quinapril held given NPO status   - Check BMP in a.m.  4. Hypertension - At goal currently - Managed at home with amlodipine and quinapril - Will treat prn with IV hydralazine while NPO   5. GERD - Pt has hx of GERD and gastritis, not currently under treatment  - This could be source of her chest pain in light of initial cardiac w/u being neg so far and the SBO with N/V   6. Hypokalemia -Potassium of 3.2, will replete.   DVT prophylaxis: SQ Heparin Code Status: DNR Family Communication:  Disposition Plan:  Diet: Diet NPO time specified Except for: Ice Chips  Consultants:   Gen Surgery  Procedures:   None  Antimicrobials:   None  Objective: Vitals:   06/06/16 1803 06/06/16 2216 06/06/16 2334 06/07/16 0628  BP: (!) 173/61 (!) 198/65 (!) 170/71 (!) 177/65  Pulse:  76 85 77  Resp:      Temp:  98.2 F (36.8 C)  98.1 F (36.7 C)  TempSrc:  Oral  Oral  SpO2:  95%  94%  Weight:      Height:        Intake/Output Summary (Last  24 hours) at 06/07/16 1317 Last data filed at 06/07/16 0603  Gross per 24 hour  Intake          1736.25 ml  Output              700 ml  Net          1036.25 ml   Filed Weights   06/06/16 0325  Weight: 52.2 kg (115 lb 1.3 oz)    Examination: General exam: Appears calm and comfortable  Respiratory system: Clear to auscultation. Respiratory effort normal. Cardiovascular system: S1 & S2 heard, RRR. No JVD, murmurs, rubs, gallops or clicks. No pedal edema. Gastrointestinal system: Abdomen is nondistended,  soft and nontender. No organomegaly or masses felt. Normal bowel sounds heard. Central nervous system: Alert and oriented. No focal neurological deficits. Extremities: Symmetric 5 x 5 power. Skin: No rashes, lesions or ulcers Psychiatry: Judgement and insight appear normal. Mood & affect appropriate.   Data Reviewed: I have personally reviewed following labs and imaging studies  CBC:  Recent Labs Lab 06/05/16 2125 06/07/16 0455  WBC 10.4 5.7  HGB 12.4 10.7*  HCT 39.3 34.0*  MCV 88.5 88.1  PLT 241 194   Basic Metabolic Panel:  Recent Labs Lab 06/05/16 2125 06/06/16 0827 06/07/16 0455  NA 140 139 142  K 4.5 4.4 3.2*  CL 110 109 111  CO2 24 22 26   GLUCOSE 120* 90 104*  BUN 31* 30* 17  CREATININE 1.23* 1.18* 1.08*  CALCIUM 8.8* 9.0 9.1   GFR: Estimated Creatinine Clearance: 22.7 mL/min (by C-G formula based on SCr of 1.08 mg/dL (H)). Liver Function Tests:  Recent Labs Lab 06/05/16 2125  AST 21  ALT 11*  ALKPHOS 99  BILITOT 0.6  PROT 5.9*  ALBUMIN 3.2*    Recent Labs Lab 06/05/16 2125  LIPASE 41   No results for input(s): AMMONIA in the last 168 hours. Coagulation Profile: No results for input(s): INR, PROTIME in the last 168 hours. Cardiac Enzymes:  Recent Labs Lab 06/06/16 0355 06/06/16 0827 06/06/16 1334  TROPONINI <0.03 <0.03 <0.03   BNP (last 3 results)  Recent Labs  02/16/16 1346  PROBNP 312.0*   HbA1C: No results for input(s): HGBA1C in the last 72 hours. CBG: No results for input(s): GLUCAP in the last 168 hours. Lipid Profile: No results for input(s): CHOL, HDL, LDLCALC, TRIG, CHOLHDL, LDLDIRECT in the last 72 hours. Thyroid Function Tests: No results for input(s): TSH, T4TOTAL, FREET4, T3FREE, THYROIDAB in the last 72 hours. Anemia Panel: No results for input(s): VITAMINB12, FOLATE, FERRITIN, TIBC, IRON, RETICCTPCT in the last 72 hours. Urine analysis:    Component Value Date/Time   COLORURINE YELLOW 06/05/2016 2245    APPEARANCEUR CLOUDY (A) 06/05/2016 2245   LABSPEC 1.015 06/05/2016 2245   PHURINE 5.0 06/05/2016 2245   GLUCOSEU NEGATIVE 06/05/2016 2245   HGBUR TRACE (A) 06/05/2016 2245   BILIRUBINUR NEGATIVE 06/05/2016 2245   KETONESUR NEGATIVE 06/05/2016 2245   PROTEINUR NEGATIVE 06/05/2016 2245   UROBILINOGEN 0.2 07/21/2015 2225   NITRITE POSITIVE (A) 06/05/2016 2245   LEUKOCYTESUR TRACE (A) 06/05/2016 2245   Sepsis Labs: @LABRCNTIP (procalcitonin:4,lacticidven:4)  )No results found for this or any previous visit (from the past 240 hour(s)).   Invalid input(s): PROCALCITONIN, LACTICACIDVEN   Radiology Studies: Ct Abdomen Pelvis Wo Contrast  Result Date: 06/06/2016 CLINICAL DATA:  Initial evaluation for acute abdominal pain with vomiting. Concern for possible small bowel obstruction. EXAM: CT ABDOMEN AND PELVIS WITHOUT CONTRAST TECHNIQUE: Multidetector CT imaging of  the abdomen and pelvis was performed following the standard protocol without IV contrast. COMPARISON:  Prior CT from 07/21/2015. FINDINGS: Lower chest: Few scattered subcentimeter nodular densities within the right lower lobe measuring 5 mm are stable from prior, most likely benign. Scattered atelectatic changes present within the left lung base. Visualized lungs are otherwise clear. Prominent calcifications noted at the aortic valve. No pleural or pericardial effusion. Hepatobiliary: Limited noncontrast evaluation of the liver is grossly unremarkable. No evidence for cholelithiasis. No overt biliary dilatation. Pancreas: Pancreas grossly unremarkable without acute inflammatory changes. Spleen: Spleen within normal limits. Adrenals/Urinary Tract: Adrenal glands grossly unremarkable. Kidneys equal in measuring up to 8 mm, similar to prior, likely small proteinaceous and/ or hemorrhagic cysts. Peripherally calcified exophytic cyst at the upper pole of the right kidney measures 5.8 cm on today's exam. Bladder decompressed without acute  abnormality. Stomach/Bowel: Stomach within normal limits. Multiple dilated loops of small bowel are seen within the upper and lower abdomen. These measure up to 4.4 cm in diameter and demonstrate associated internal air-fluid levels. The terminal ileum is decompressed distally. Findings compatible with mechanical small bowel obstruction. Exact transition point is difficult to discern on this noncontrast examination. Note is made of a left inguinal hernia containing fluid and a loop of bowel. This is appears to be a portion of the colon, not a small bowel loop. Colon itself is of relatively normal caliber without acute abnormality. Additional fat containing ventral hernia noted more superiorly within the abdomen (series 2, image 24). Vascular/Lymphatic: Prominent aorto bi-iliac atherosclerotic disease identified. Intra-abdominal aorta dilated at 3.4 cm. Node definite pathologically enlarged lymph nodes identified on this noncontrast examination. Reproductive: Uterus and ovaries not well seen, and may be absent. He should be it had he had Other: Moderate volume free fluid within the abdomen, likely reactive. No definite free intraperitoneal air. Musculoskeletal: Scoliosis with multilevel degenerative spondylolysis present. No acute osseous abnormality. No worrisome lytic or blastic osseous lesions. IMPRESSION: 1. Findings compatible with high-grade mechanical small bowel obstruction. Exact transition is difficult to discern on this examination, and not definitely identified. 2. Moderate volume free fluid within the abdomen, likely reactive. 3. Small fluid and bowel containing left inguinal hernia. This appears to contain a loop of colon, not small bowel, and is favored to not reflect the point of obstruction. 4. Stable 3.4 cm infrarenal abdominal aortic aneurysm. Recommend followup by ultrasound in 3 years. This recommendation follows ACR consensus guidelines: White Paper of the ACR Incidental Findings Committee II on  Vascular Findings. [place] Coll Radiol 2013; 10:789-794 Electronically Signed   By: Rise Mu M.D.   On: 06/06/2016 00:44   Dg Abd 1 View  Result Date: 06/06/2016 CLINICAL DATA:  Nasogastric tube placement for small bowel obstruction. EXAM: ABDOMEN - 1 VIEW COMPARISON:  CT 06/05/2016. Fluoroscopy Time:  1 minutes and 54 seconds Radiation Exposure Index (if provided by the fluoroscopic device): 7.2 mGy Number of Acquired Spot Images: 0 FINDINGS: Nasogastric tube placement was performed by the radiology technologist. A 14 French nasogastric tube was placed. 90 ml of Gastrografin was utilized to aid placement of the nasogastric tube. A single spot image after tube placement demonstrates the tip of the tube in the left upper quadrant of the abdomen. IMPRESSION: Nasogastric tube placement under fluoroscopy. Electronically Signed   By: Carey Bullocks M.D.   On: 06/06/2016 08:40   Dg Abd Portable 1v-small Bowel Obstruction Protocol-initial, 8 Hr Delay  Result Date: 06/06/2016 CLINICAL DATA:  Small bowel obstruction.  8  hour delayed film. EXAM: PORTABLE ABDOMEN - 1 VIEW COMPARISON:  CT scan from 06/05/2016 FINDINGS: Portable supine view the abdomen at 1743 hours shows diffuse gaseous distension of small bowel and colon. There is contrast material in the right colon, splenic flexure, descending colon and rectum. In CT tip overlies the mid stomach. IMPRESSION: Contrast material is visualized in the colon with diffuse gaseous distention of large and small bowel. Electronically Signed   By: Kennith CenterEric  Mansell M.D.   On: 06/06/2016 17:55   Dg Vangie BickerNaso G Tube Plc W/fl-no Rad  Result Date: 06/06/2016 CLINICAL DATA:  NASO G TUBE PLACEMENT WITH FLUORO Fluoroscopy was utilized by the requesting physician.  No radiographic interpretation.        Scheduled Meds: . artificial tears   Both Eyes BID  . famotidine (PEPCID) IV  20 mg Intravenous Q24H  . heparin  5,000 Units Subcutaneous Q8H  . mouth rinse  15 mL Mouth  Rinse BID  . sodium chloride flush  3 mL Intravenous Q12H   Continuous Infusions: . dextrose 5 % and 0.45% NaCl 1,000 mL with potassium chloride 40 mEq infusion 100 mL/hr at 06/07/16 1036     LOS: 1 day    Time spent: 35 minutes    Lex Linhares A, MD [facility] Pager [phone]  If 7PM-7AM, please contact night-coverage [email or web address] Password [email or web address] 06/07/2016, 1:17 PM

## 2016-06-07 NOTE — Care Management Note (Signed)
Case Management Note  Patient Details  Name: Erin Gibson MRN: [id number] Date of Birth: 06/23/1919  Subjective/Objective:                    Action/Plan:  Discussed discharge planning with patient . Patient is willing to go to a SNF for short term rehab . [facility] , she does not want to go back there. [facility]. Consulted SW and left [facility] voice mail. Expected Discharge Date:                  Expected Discharge Plan:  Skilled Nursing Facility  In-House Referral:  Clinical Social Work  Discharge planning Services     Post Acute Care Choice:    Choice offered to:  Patient  DME Arranged:    DME Agency:     HH Arranged:    HH Agency:     Status of Service:  In process, will continue to follow  If discussed at Long Length of Stay Meetings, dates discussed:    Additional Comments:  Kingsley PlanWile, Teddy Rebstock Marie, RN 06/07/2016, 3:39 PM

## 2016-06-07 NOTE — Evaluation (Signed)
Occupational Therapy Evaluation Patient Details Name: Erin CirriViolet W Gibson MRN: [id number] DOB: 07/10/1919 Today's Date: 06/07/2016    History of Present Illness Erin Gibson is a charming 82 y.o. female with medical history significant for CKD stage III, hypertension, multiple abdominal surgeries, and recurrent SBO who presents to the ED for evaluation of abdominal pain, nausea, and non-bloody vomiting   Clinical Impression   Pt with decline in function and safety with ADLs and ADL mobility with decreased strength, balance and endurance. Pt fatigues easily and require cues for deep breathing after min - mod activity. Pt would benefit from sort term SNF rehab, however tpt and her daughter would prefer to return to home with increased daily caregiver/aide assist. Pt would benefit from acute OT services to address impairments to increase level of function and safety    Follow Up Recommendations  SNF; (would prefer Home health OT);Supervision/Assistance - 24 hour    Equipment Recommendations  None recommended by OT    Recommendations for Other Services       Precautions / Restrictions Precautions Precautions: Fall Precaution Comments: hx of falls at home Restrictions Weight Bearing Restrictions: No      Mobility Bed Mobility Overal bed mobility: Needs Assistance Bed Mobility: Supine to Sit;Sit to Supine     Supine to sit: Supervision Sit to supine: Min assist   General bed mobility comments: min A with LEs back onto bed and [facility]  Transfers Overall transfer level: Needs assistance Equipment used: Rolling walker (2 wheeled) Transfers: Sit to/from Stand Sit to Stand: Mod assist         General transfer comment: pt required momentum to power up and multiple attempts before being able to stand from bed and BSC. Cues for correct hand placement and safe technique    Balance Overall balance assessment: Needs assistance;History of Falls   Sitting balance-Leahy  Scale: Fair       Standing balance-Leahy Scale: Poor                              ADL Overall ADL's : Needs assistance/impaired     Grooming: Wash/dry hands;Wash/dry face;Min guard;Standing   Upper Body Bathing: Supervision/ safety;Set up;Sitting   Lower Body Bathing: Moderate assistance;Sit to/from stand   Upper Body Dressing : Set up;Supervision/safety;Sitting   Lower Body Dressing: Moderate assistance;Sit to/from stand   Toilet Transfer: Moderate assistance;RW;BSC;Cueing for safety Toilet Transfer Details (indicate cue type and reason): pt required momentum to power up and multiple attempts before being able to stand from bed and BSC. Cues for correct hand placement and safe technique Toileting- Clothing Manipulation and Hygiene: Moderate assistance       Functional mobility during ADLs: Moderate assistance;Rolling walker;Cueing for safety General ADL Comments: Pt states that she falls often at home      Vision  no change from basline              Pertinent Vitals/Pain Pain Assessment: No/denies pain     Hand Dominance Right   Extremity/Trunk Assessment Upper Extremity Assessment Upper Extremity Assessment: Generalized weakness   Lower Extremity Assessment Lower Extremity Assessment: Defer to PT evaluation   Cervical / Trunk Assessment Cervical / Trunk Assessment: Kyphotic   Communication Communication Communication: No difficulties   Cognition Arousal/Alertness: Awake/alert Behavior During Therapy: WFL for tasks assessed/performed Overall Cognitive Status: Within Functional Limits for tasks assessed  General Comments   pt very pleasant and cooperative                 Home Living Family/patient expects to be discharged to:: Private residence Living Arrangements: Alone Available Help at Discharge: Home health;Available 24 hours/day Type of Home: House Home Access: Level entry;Stairs to enter      Home Layout: One level     Bathroom Shower/Tub: Chief Strategy Officer: Standard     Home Equipment: Environmental consultant - 2 wheels;Bedside commode;Shower seat;Tub bench   Additional Comments: will not use tub bench or 3 in 1/BSC per pt's daughter      Prior Functioning/Environment Level of Independence: Independent with assistive device(s)        Comments:  ADLs independently PTA. Has cargeivers 7 days/wk for 2 hours each day for home mgt and transportation    OT Diagnosis: Generalized weakness   OT Problem List: Decreased strength;Impaired balance (sitting and/or standing);Decreased safety awareness;Decreased activity tolerance;Decreased knowledge of use of DME or AE   OT Treatment/Interventions: Self-care/ADL training;Therapeutic activities;DME and/or AE instruction;Patient/family education    OT Goals(Current goals can be found in the care plan section) Acute Rehab OT Goals Patient Stated Goal: go home with more assist OT Goal Formulation: With patient/family Time For Goal Achievement: 06/14/16 Potential to Achieve Goals: Good ADL Goals Pt Will Perform Grooming: with supervision;with set-up;standing Pt Will Perform Lower Body Bathing: with min assist;with caregiver independent in assisting Pt Will Perform Lower Body Dressing: with min assist;with caregiver independent in assisting Pt Will Transfer to Toilet: with min assist;with min guard assist;ambulating Pt Will Perform Toileting - Clothing Manipulation and hygiene: with min assist;with min guard assist;with caregiver independent in assisting Pt Will Perform Tub/Shower Transfer: with min assist;with min guard assist;shower seat;tub bench Additional ADL Goal #1: Pt will complete sit - stand transition safely with min A in prep for ADL mobility  OT Frequency: Min 2X/week   Barriers to D/C:    pt home alone with caregivers 2 hours/day, 7 days/week and daughter plans to increase caregiver time                      End of Session Equipment Utilized During Treatment: Gait belt;Rolling walker;Other (comment) (BSC)  Activity Tolerance: Patient tolerated treatment well Patient left: in bed;with call bell/phone within reach;with family/visitor present   Time: 1610-9604 OT Time Calculation (min): 28 min Charges:  OT General Charges $OT Visit: 1 Procedure OT Evaluation $OT Eval Moderate Complexity: 1 Procedure OT Treatments $Therapeutic Activity: 8-22 mins G-Codes:    Galen Manila 06/07/2016, 1:38 PM

## 2016-06-07 NOTE — Progress Notes (Signed)
[facility] Progress Note     Subjective: Laying in bed, NAE overnight. Reports feeling much better today. Abdominal pain improving. Reports 3 BMs yesterday and 1 BM this morning that was non-bloody and loosely formed. Denies nausea. NGT in place, <10 cc in cannister.   No output from NGT documented in EPIC, it is connected to LIWS.  Objective: Vital signs in last 24 hours: Temp:  [98.1 F (36.7 C)-98.2 F (36.8 C)] 98.1 F (36.7 C) (09/14 0628) Pulse Rate:  [53-85] 77 (09/14 0628) Resp:  [16] 16 (09/13 1341) BP: (170-198)/(52-71) 177/65 (09/14 0628) SpO2:  [94 %-97 %] 94 % (09/14 0628) Last BM Date: 06/05/16  Intake/Output from previous day: 09/13 0701 - 09/14 0700 In: 1736.3 [I.V.:1736.3] Out: 700 [Urine:700] Intake/Output this shift: No intake/output data recorded.  PE: Gen:  Alert, NAD, pleasant and resting comfortably Card:  RRR, no M/G/R  Pulm:  CTA, no W/R/R Abd: Soft, TTP BL lower quadrants, mild distention, +BS in all 4 quadrants.  Lab Results:   Recent Labs  06/05/16 2125 06/07/16 0455  WBC 10.4 5.7  HGB 12.4 10.7*  HCT 39.3 34.0*  PLT 241 194   BMET  Recent Labs  06/06/16 0827 06/07/16 0455  NA 139 142  K 4.4 3.2*  CL 109 111  CO2 22 26  GLUCOSE 90 104*  BUN 30* 17  CREATININE 1.18* 1.08*  CALCIUM 9.0 9.1   PT/INR No results for input(s): LABPROT, INR in the last 72 hours. CMP     Component Value Date/Time   NA 142 06/07/2016 0455   K 3.2 (L) 06/07/2016 0455   CL 111 06/07/2016 0455   CO2 26 06/07/2016 0455   GLUCOSE 104 (H) 06/07/2016 0455   GLUCOSE 89 10/02/2006 1104   BUN 17 06/07/2016 0455   CREATININE 1.08 (H) 06/07/2016 0455   CALCIUM 9.1 06/07/2016 0455   PROT 5.9 (L) 06/05/2016 2125   ALBUMIN 3.2 (L) 06/05/2016 2125   AST 21 06/05/2016 2125   ALT 11 (L) 06/05/2016 2125   ALKPHOS 99 06/05/2016 2125   BILITOT 0.6 06/05/2016 2125   GFRNONAA 42 (L) 06/07/2016 0455   GFRAA 49 (L) 06/07/2016 0455   Lipase      Component Value Date/Time   LIPASE 41 06/05/2016 2125       Studies/Results: Ct Abdomen Pelvis Wo Contrast  Result Date: 06/06/2016 CLINICAL DATA:  Initial evaluation for acute abdominal pain with vomiting. Concern for possible small bowel obstruction. EXAM: CT ABDOMEN AND PELVIS WITHOUT CONTRAST TECHNIQUE: Multidetector CT imaging of the abdomen and pelvis was performed following the standard protocol without IV contrast. COMPARISON:  Prior CT from 07/21/2015. FINDINGS: Lower chest: Few scattered subcentimeter nodular densities within the right lower lobe measuring 5 mm are stable from prior, most likely benign. Scattered atelectatic changes present within the left lung base. Visualized lungs are otherwise clear. Prominent calcifications noted at the aortic valve. No pleural or pericardial effusion. Hepatobiliary: Limited noncontrast evaluation of the liver is grossly unremarkable. No evidence for cholelithiasis. No overt biliary dilatation. Pancreas: Pancreas grossly unremarkable without acute inflammatory changes. Spleen: Spleen within normal limits. Adrenals/Urinary Tract: Adrenal glands grossly unremarkable. Kidneys equal in measuring up to 8 mm, similar to prior, likely small proteinaceous and/ or hemorrhagic cysts. Peripherally calcified exophytic cyst at the upper pole of the right kidney measures 5.8 cm on today's exam. Bladder decompressed without acute abnormality. Stomach/Bowel: Stomach within normal limits. Multiple dilated loops of small bowel are seen within the upper and  lower abdomen. These measure up to 4.4 cm in diameter and demonstrate associated internal air-fluid levels. The terminal ileum is decompressed distally. Findings compatible with mechanical small bowel obstruction. Exact transition point is difficult to discern on this noncontrast examination. Note is made of a left inguinal hernia containing fluid and a loop of bowel. This is appears to be a portion of the colon, not a  small bowel loop. Colon itself is of relatively normal caliber without acute abnormality. Additional fat containing ventral hernia noted more superiorly within the abdomen (series 2, image 24). Vascular/Lymphatic: Prominent aorto bi-iliac atherosclerotic disease identified. Intra-abdominal aorta dilated at 3.4 cm. Node definite pathologically enlarged lymph nodes identified on this noncontrast examination. Reproductive: Uterus and ovaries not well seen, and may be absent. He should be it had he had Other: Moderate volume free fluid within the abdomen, likely reactive. No definite free intraperitoneal air. Musculoskeletal: Scoliosis with multilevel degenerative spondylolysis present. No acute osseous abnormality. No worrisome lytic or blastic osseous lesions. IMPRESSION: 1. Findings compatible with high-grade mechanical small bowel obstruction. Exact transition is difficult to discern on this examination, and not definitely identified. 2. Moderate volume free fluid within the abdomen, likely reactive. 3. Small fluid and bowel containing left inguinal hernia. This appears to contain a loop of colon, not small bowel, and is favored to not reflect the point of obstruction. 4. Stable 3.4 cm infrarenal abdominal aortic aneurysm. Recommend followup by ultrasound in 3 years. This recommendation follows ACR consensus guidelines: White Paper of the ACR Incidental Findings Committee II on Vascular Findings. Alba DestineJ Am Coll Radiol 2013; 10:789-794 Electronically Signed   By: Rise MuBenjamin  McClintock M.D.   On: 06/06/2016 00:44   Dg Abd 1 View  Result Date: 06/06/2016 CLINICAL DATA:  Nasogastric tube placement for small bowel obstruction. EXAM: ABDOMEN - 1 VIEW COMPARISON:  CT 06/05/2016. Fluoroscopy Time:  1 minutes and 54 seconds Radiation Exposure Index (if provided by the fluoroscopic device): 7.2 mGy Number of Acquired Spot Images: 0 FINDINGS: Nasogastric tube placement was performed by the radiology technologist. A 14 French  nasogastric tube was placed. 90 ml of Gastrografin was utilized to aid placement of the nasogastric tube. A single spot image after tube placement demonstrates the tip of the tube in the left upper quadrant of the abdomen. IMPRESSION: Nasogastric tube placement under fluoroscopy. Electronically Signed   By: Carey BullocksWilliam  Veazey M.D.   On: 06/06/2016 08:40   Dg Abd Portable 1v-small Bowel Obstruction Protocol-initial, 8 Hr Delay  Result Date: 06/06/2016 CLINICAL DATA:  Small bowel obstruction.  8 hour delayed film. EXAM: PORTABLE ABDOMEN - 1 VIEW COMPARISON:  CT scan from 06/05/2016 FINDINGS: Portable supine view the abdomen at 1743 hours shows diffuse gaseous distension of small bowel and colon. There is contrast material in the right colon, splenic flexure, descending colon and rectum. In CT tip overlies the mid stomach. IMPRESSION: Contrast material is visualized in the colon with diffuse gaseous distention of large and small bowel. Electronically Signed   By: Kennith CenterEric  Mansell M.D.   On: 06/06/2016 17:55   Dg Vangie BickerNaso G Tube Plc W/fl-no Rad  Result Date: 06/06/2016 CLINICAL DATA:  NASO G TUBE PLACEMENT WITH FLUORO Fluoroscopy was utilized by the requesting physician.  No radiographic interpretation.    Anti-infectives: Anti-infectives    None     Assessment/Plan SBO, recurrent  - PMH hysterectomy, open cholecystectomy, hernia repair - CT Abd: high-grade mechanical SBO with indeterminate transition point  - NGT placed under fluoro 9/13 - gastrografin reached the  colon on gastrografin 8 h follow through - multiple BMs, distention and abdominal pain decreasing, low NGT output   FEN: IVF, NGT clamping trial, sips from floor  ID: none DVT Proph: heparin, SCD's  Plan: clamp NGT, start sips from the floor. Will re-evaluate this afternoon for possible discontinuation of NGT and advancement of diet.   LOS: 1 day    Adam Phenix , [facility] 06/07/2016, 9:23 AM Pager:  [phone] Consults: [phone] Mon-Fri 7:00 am-4:30 pm Sat-Sun 7:00 am-11:30 am

## 2016-06-07 NOTE — Progress Notes (Signed)
Patient ID: [name], female   DOB: 07/07/1919, 82 y.o.   MRN: [id number]  Patient feeling well since NG tube clamp this morning. +BM, +BS, no n/v. Tolerating sips. NGT pulled and advanced to clear liquid diet.  Edson SnowballBROOKE A MILLER, [facility] 06/07/2016, 4:31 PM Pager: [phone] Consults: [phone] Mon-Fri 7:00 am-4:30 pm Sat-Sun 7:00 am-11:30 am

## 2016-06-08 ENCOUNTER — Ambulatory Visit: Payer: Medicare Other | Admitting: Internal Medicine

## 2016-06-08 DIAGNOSIS — R079 Chest pain, unspecified: Secondary | ICD-10-CM

## 2016-06-08 LAB — URINE CULTURE

## 2016-06-08 LAB — BASIC METABOLIC PANEL
Anion gap: 6 (ref 5–15)
BUN: 11 mg/dL (ref 6–20)
CHLORIDE: 111 mmol/L (ref 101–111)
CO2: 25 mmol/L (ref 22–32)
CREATININE: 1.01 mg/dL — AB (ref 0.44–1.00)
Calcium: 9.3 mg/dL (ref 8.9–10.3)
GFR calc Af Amer: 53 mL/min — ABNORMAL LOW (ref >60)
GFR, EST NON AFRICAN AMERICAN: 46 mL/min — AB (ref >60)
Glucose, Bld: 95 mg/dL (ref 65–99)
Potassium: 3.8 mmol/L (ref 3.5–5.1)
SODIUM: 142 mmol/L (ref 135–145)

## 2016-06-08 MED ORDER — KCL IN DEXTROSE-NACL 40-5-0.45 MEQ/L-%-% IV SOLN
INTRAVENOUS | Status: DC
  Administered 2016-06-08 – 2016-06-09 (×3): via INTRAVENOUS
  Filled 2016-06-08 (×3): qty 1000

## 2016-06-08 MED ORDER — POLYETHYLENE GLYCOL 3350 17 G PO PACK
17.0000 g | PACK | Freq: Every day | ORAL | Status: DC
  Administered 2016-06-08 – 2016-06-10 (×3): 17 g via ORAL
  Filled 2016-06-08 (×3): qty 1

## 2016-06-08 NOTE — Progress Notes (Signed)
S: No acute events. Had some jello and broth last night without issue. Main complaint this morning is that she is cold.   Vitals, labs, intake/output, and orders reviewed at this time. HTN 190/85  Gen: A&Ox3, no distress H&N: EOMI, atraumatic, neck supple Chest: unlabored respirations, RRR Abd: soft, mildly subjectively tender, nondistended Ext: warm, no edema Neuro: grossly normal  Lines/tubes/drains: PIV  A/P:  82yo woman with suspected recurrent adhesive small bowel obstruction, resolving. Slowly advance diet today.  Mobilize with PT/OT HTN- per primary Hypokalemia 3.2- replace per primary  Will continue to follow  Erin Blakeshelsea Hera Celaya, MD [facility], [place] Pager [phone]

## 2016-06-08 NOTE — Clinical Social Work Placement (Signed)
   CLINICAL SOCIAL WORK PLACEMENT  NOTE  Date:  06/08/2016  Patient Details  Name: Erin Gibson MRN: [id number] Date of Birth: 09/30/1918  Clinical Social Work is seeking post-discharge placement for this patient at the Skilled  Nursing Facility level of care (*CSW will initial, date and re-position this form in  chart as items are completed):  Yes   Patient/family provided with [facility] Clinical Social Work Department's list of facilities offering this level of care within the geographic area requested by the patient (or if unable, by the patient's family).  Yes   Patient/family informed of their freedom to choose among providers that offer the needed level of care, that participate in Medicare, Medicaid or managed care program needed by the patient, have an available bed and are willing to accept the patient.  Yes   Patient/family informed of 's ownership interest [facility] and [facility], as well as of the fact that they are under no obligation to receive care at these facilities.  PASRR submitted to EDS on       PASRR number received on       Existing PASRR number confirmed on 06/08/16     FL2 transmitted to all facilities in geographic area requested by pt/family on 06/08/16     FL2 transmitted to all facilities within larger geographic area on       Patient informed that his/her managed care company has contracts with or will negotiate with certain facilities, including the following:            Patient/family informed of bed offers received.  Patient chooses bed at       Physician recommends and patient chooses bed at      Patient to be transferred to   on  .  Patient to be transferred to facility by       Patient family notified on   of transfer.  Name of family member notified:        PHYSICIAN Please sign FL2, Please sign DNR, Please prepare prescriptions     Additional Comment:     _______________________________________________ [name], [name], LCSW 06/08/2016, 12:50 PM

## 2016-06-08 NOTE — Progress Notes (Signed)
Physical Therapy Treatment Patient Details Name: Erin Gibson MRN: [id number] DOB: 1918/12/30 Today's Date: 06/08/2016    History of Present Illness Pt is a 82 y.o. female admitted with abdominal pain, nausea and vomiting. Pt being treated for SBO. PMH: HTN, hx of DVT, CKD, anemia, TKA.     PT Comments    Pt able to ambulate 70 feet with rw and 1 loss of balance with assist to recover. Pt remains at high fall risk with ambulation. Recommending SNF for further rehabilitation following her acute stay. Pt states that she is agreeable to this if she can go to a good place. PT to continue to follow and progress mobility and safety.   Follow Up Recommendations  SNF;Supervision for mobility/OOB     Equipment Recommendations  None recommended by PT    Recommendations for Other Services       Precautions / Restrictions Precautions Precautions: Fall Precaution Comments: hx of falls at home Restrictions Weight Bearing Restrictions: No    Mobility  Bed Mobility               General bed mobility comments: sitting in chair upon arrival  Transfers Overall transfer level: Needs assistance Equipment used: Rolling walker (2 wheeled) Transfers: Sit to/from Stand Sit to Stand: Min assist         General transfer comment: cues to use hands to push from chair.  Ambulation/Gait Ambulation/Gait assistance: Min guard Ambulation Distance (Feet): 70 Feet Assistive device: Rolling walker (2 wheeled) Gait Pattern/deviations: Step-through pattern;Decreased step length - right;Decreased step length - left;Trunk flexed Gait velocity: decreased   General Gait Details: Pt with 1 loss of balance when turning, min assist to recover.    Stairs            Wheelchair Mobility    Modified Rankin (Stroke Patients Only)       Balance Overall balance assessment: Needs assistance Sitting-balance support: No upper extremity supported Sitting balance-Leahy Scale: Fair      Standing balance support: Bilateral upper extremity supported Standing balance-Leahy Scale: Poor Standing balance comment: using rw and intermittent physical assist                     Cognition Arousal/Alertness: Awake/alert Behavior During Therapy: WFL for tasks assessed/performed Overall Cognitive Status: Within Functional Limits for tasks assessed                      Exercises      General Comments        Pertinent Vitals/Pain Pain Assessment: No/denies pain    Home Living                      Prior Function            PT Goals (current goals can now be found in the care plan section) Acute Rehab PT Goals Patient Stated Goal: walk safer PT Goal Formulation: With patient Time For Goal Achievement: 06/21/16 Potential to Achieve Goals: Fair Progress towards PT goals: Progressing toward goals    Frequency    Min 3X/week      PT Plan Current plan remains appropriate    Co-evaluation             End of Session Equipment Utilized During Treatment: Gait belt Activity Tolerance: Patient limited by fatigue Patient left: in chair;with call bell/phone within reach;with chair alarm set     Time: 9562-1308 PT Time Calculation (min) (ACUTE ONLY):  17 min  Charges:  $Gait Training: 8-22 mins                    G Codes:      Christiane HaBenjamin J. Dawayne Ohair, PT, CSCS Pager [phone] Office [phone]  06/08/2016, 3:50 PM

## 2016-06-08 NOTE — Progress Notes (Signed)
PROGRESS NOTE  Erin Gibson  ZOX:[id number] DOB: 01/22/1919 [name]: 06/05/2016 PCP: [name],[name], MD Outpatient Specialists:  Subjective: NG tube removed, had multiple bowel movements, appears to be doing much better. Tolerated clear liquids, per general surgery advance diet as tolerated. We will advance to full liquids.  Brief Narrative:  Erin CirriViolet W Gibson is a charming 82 y.o. female with medical history significant for CKD stage III, hypertension, multiple abdominal surgeries, and recurrent SBO who presents to the ED for evaluation of abdominal pain, nausea, and non-bloody vomiting. Patient reports occasional, fleeting pains in her lower abdomen for a few days, but continued her usual diet without incident and had a normal bowel movement on the day of presentation. Later that evening, Erin Gibson suffered a severe recurrence in the low-mid abdominal pain that was persistent. Nausea soon followed and she began vomiting. Pain, described as severe, crampy, band-like across mid-low abdomen, and without alleviating or exacerbating factors identified. She also developed central chest pain and dyspnea amidst all of this and took several SL nitroglycerin doses with relief in those symptoms. She reports some improvement in her symptoms by time of arrival and she attributes this to vomiting.    Assessment & Plan:   Principal Problem:   SBO (small bowel obstruction) (HCC) Active Problems:   Essential hypertension   GERD   Abdominal pain   CKD (chronic kidney disease), stage III   Chest pain   Protein-calorie malnutrition, severe    1. SBO - Pt has hx multiple abd surgeries and recurrent SBO's resolving with conservative management  - Symptoms became severe several hrs prior to arrival in ED; she had normal BM that am  - CT suggests high-grade mechanical SBO with indeterminate transition point  - Treated her briefly with NG tube to low intermittent suction. -Repeat of abdominal x-ray  showed contrast medium in the colon, had 4 bowel movements since yesterday. -NG tube removed yesterday, was on clear liquids since 9/14 afternoon and she did well, and thiazide to full liquids.  2. Chest pain  - Developed with the acute abd pain, N/V and resolved after she took multiple doses SL NTG pta - This is likely secondary to the recurrent vomiting, no evidence of ischemia per EKG and troponin.   3. CKD stage III  - SCr 1.23 on admission, up from baseline of ~1.1 - Currently NPO, receiving IVF with NS at 100 cc/hr  - Decrease IV fluids as patient started to take some oral liquids.  4. Hypertension - At goal currently - Managed at home with amlodipine and quinapril - Will treat prn with IV hydralazine while NPO   5. GERD - Pt has hx of GERD and gastritis, not currently under treatment  - This could be source of her chest pain in light of initial cardiac w/u being neg so far and the SBO with N/V   6. Hypokalemia -Potassium of 3.2, will replete.   DVT prophylaxis: SQ Heparin Code Status: DNR Family Communication:  Disposition Plan:  Diet: Diet clear liquid Room service appropriate? Yes; Fluid consistency: Thin  Consultants:   Gen Surgery  Procedures:   None  Antimicrobials:   None  Objective: Vitals:   06/07/16 0628 06/07/16 1333 06/07/16 2108 06/08/16 0642  BP: (!) 177/65 (!) 182/73 (!) 189/76 (!) 190/85  Pulse: 77 60 72 72  Resp:  20 18 18   Temp: 98.1 F (36.7 C) 97.5 F (36.4 C) 98.5 F (36.9 C) 98.2 F (36.8 C)  TempSrc: Oral Oral Oral  Oral  SpO2: 94% 97% 96% 95%  Weight:      Height:        Intake/Output Summary (Last 24 hours) at 06/08/16 1144 Last data filed at 06/08/16 1035  Gross per 24 hour  Intake              800 ml  Output             1800 ml  Net            -1000 ml   Filed Weights   06/06/16 0325  Weight: 52.2 kg (115 lb 1.3 oz)    Examination: General exam: Appears calm and comfortable  Respiratory system: Clear to  auscultation. Respiratory effort normal. Cardiovascular system: S1 & S2 heard, RRR. No JVD, murmurs, rubs, gallops or clicks. No pedal edema. Gastrointestinal system: Abdomen is nondistended, soft and nontender. No organomegaly or masses felt. Normal bowel sounds heard. Central nervous system: Alert and oriented. No focal neurological deficits. Extremities: Symmetric 5 x 5 power. Skin: No rashes, lesions or ulcers Psychiatry: Judgement and insight appear normal. Mood & affect appropriate.   Data Reviewed: I have personally reviewed following labs and imaging studies  CBC:  Recent Labs Lab 06/05/16 2125 06/07/16 0455  WBC 10.4 5.7  HGB 12.4 10.7*  HCT 39.3 34.0*  MCV 88.5 88.1  PLT 241 194   Basic Metabolic Panel:  Recent Labs Lab 06/05/16 2125 06/06/16 0827 06/07/16 0455 06/08/16 0647  NA 140 139 142 142  K 4.5 4.4 3.2* 3.8  CL 110 109 111 111  CO2 24 22 26 25   GLUCOSE 120* 90 104* 95  BUN 31* 30* 17 11  CREATININE 1.23* 1.18* 1.08* 1.01*  CALCIUM 8.8* 9.0 9.1 9.3   GFR: Estimated Creatinine Clearance: 24.2 mL/min (by C-G formula based on SCr of 1.01 mg/dL (H)). Liver Function Tests:  Recent Labs Lab 06/05/16 2125  AST 21  ALT 11*  ALKPHOS 99  BILITOT 0.6  PROT 5.9*  ALBUMIN 3.2*    Recent Labs Lab 06/05/16 2125  LIPASE 41   No results for input(s): AMMONIA in the last 168 hours. Coagulation Profile: No results for input(s): INR, PROTIME in the last 168 hours. Cardiac Enzymes:  Recent Labs Lab 06/06/16 0355 06/06/16 0827 06/06/16 1334  TROPONINI <0.03 <0.03 <0.03   BNP (last 3 results)  Recent Labs  02/16/16 1346  PROBNP 312.0*   HbA1C: No results for input(s): HGBA1C in the last 72 hours. CBG: No results for input(s): GLUCAP in the last 168 hours. Lipid Profile: No results for input(s): CHOL, HDL, LDLCALC, TRIG, CHOLHDL, LDLDIRECT in the last 72 hours. Thyroid Function Tests: No results for input(s): TSH, T4TOTAL, FREET4, T3FREE,  THYROIDAB in the last 72 hours. Anemia Panel: No results for input(s): VITAMINB12, FOLATE, FERRITIN, TIBC, IRON, RETICCTPCT in the last 72 hours. Urine analysis:    Component Value Date/Time   COLORURINE YELLOW 06/05/2016 2245   APPEARANCEUR CLOUDY (A) 06/05/2016 2245   LABSPEC 1.015 06/05/2016 2245   PHURINE 5.0 06/05/2016 2245   GLUCOSEU NEGATIVE 06/05/2016 2245   HGBUR TRACE (A) 06/05/2016 2245   BILIRUBINUR NEGATIVE 06/05/2016 2245   KETONESUR NEGATIVE 06/05/2016 2245   PROTEINUR NEGATIVE 06/05/2016 2245   UROBILINOGEN 0.2 07/21/2015 2225   NITRITE POSITIVE (A) 06/05/2016 2245   LEUKOCYTESUR TRACE (A) 06/05/2016 2245   Sepsis Labs: @LABRCNTIP (procalcitonin:4,lacticidven:4)  ) Recent Results (from the past 240 hour(s))  Urine culture     Status: Abnormal   Collection Time: 06/05/16  10:45 PM  Result Value Ref Range Status   Specimen Description URINE, RANDOM  Final   Special Requests NONE  Final   Culture >=100,000 COLONIES/mL KLEBSIELLA PNEUMONIAE (A)  Final   Report Status 06/08/2016 FINAL  Final   Organism ID, Bacteria KLEBSIELLA PNEUMONIAE (A)  Final      Susceptibility   Klebsiella pneumoniae - MIC*    AMPICILLIN 16 RESISTANT Resistant     CEFAZOLIN <=4 SENSITIVE Sensitive     CEFTRIAXONE <=1 SENSITIVE Sensitive     CIPROFLOXACIN <=0.25 SENSITIVE Sensitive     GENTAMICIN <=1 SENSITIVE Sensitive     IMIPENEM <=0.25 SENSITIVE Sensitive     NITROFURANTOIN 64 INTERMEDIATE Intermediate     TRIMETH/SULFA <=20 SENSITIVE Sensitive     AMPICILLIN/SULBACTAM 4 SENSITIVE Sensitive     PIP/TAZO <=4 SENSITIVE Sensitive     Extended ESBL NEGATIVE Sensitive     * >=100,000 COLONIES/mL KLEBSIELLA PNEUMONIAE     Invalid input(s): PROCALCITONIN, LACTICACIDVEN   Radiology Studies: Dg Abd Portable 1v-small Bowel Obstruction Protocol-initial, 8 Hr Delay  Result Date: 06/06/2016 CLINICAL DATA:  Small bowel obstruction.  8 hour delayed film. EXAM: PORTABLE ABDOMEN - 1 VIEW  COMPARISON:  CT scan from 06/05/2016 FINDINGS: Portable supine view the abdomen at 1743 hours shows diffuse gaseous distension of small bowel and colon. There is contrast material in the right colon, splenic flexure, descending colon and rectum. In CT tip overlies the mid stomach. IMPRESSION: Contrast material is visualized in the colon with diffuse gaseous distention of large and small bowel. Electronically Signed   By: Kennith Center M.D.   On: 06/06/2016 17:55        Scheduled Meds: . artificial tears   Both Eyes BID  . famotidine (PEPCID) IV  20 mg Intravenous Q24H  . heparin  5,000 Units Subcutaneous Q8H  . mouth rinse  15 mL Mouth Rinse BID  . sodium chloride flush  3 mL Intravenous Q12H   Continuous Infusions: . dextrose 5 % and 0.45 % NaCl with KCl 40 mEq/L 100 mL/hr at 06/08/16 0500     LOS: 2 days    Time spent: 35 minutes    Tamiyah Moulin A, MD [facility] Pager [phone]  If 7PM-7AM, please contact night-coverage [email or web address] Password [facility] 06/08/2016, 11:44 AM

## 2016-06-08 NOTE — NC FL2 (Signed)
[place] MEDICAID FL2 LEVEL OF CARE SCREENING TOOL     IDENTIFICATION  Patient Name: Erin Gibson Birthdate: 04/29/1919 Sex: female Admission Date (Current Location): 06/05/2016  [facility] [place]   Facility and Address:  [facility], [address], [place], [place] [address]      Provider Number: [id number]  Attending Physician Name and Address:  Clydia LlanoMutaz Elmahi, MD  Relative Name and Phone Number:       Current Level of Care: Hospital Recommended Level of Care: Nursing Facility Prior Approval Number:    Date Approved/Denied:   PASRR Number: [id number]  Discharge Plan: SNF    Current Diagnoses: Patient Active Problem List   Diagnosis Date Noted  . Protein-calorie malnutrition, severe 06/06/2016  . Moderate aortic stenosis 05/22/2016  . Chronic venous insufficiency 04/20/2016  . Chest pain 01/01/2016  . Fall 12/25/2015  . Elevated troponin 12/25/2015  . Scalp laceration 12/25/2015  . Hyperkalemia 12/25/2015  . UTI (lower urinary tract infection) 12/25/2015  . Odynophagia 07/23/2015  . CKD (chronic kidney disease), stage III 07/23/2015  . Faintness   . UTI (urinary tract infection) 07/22/2015  . Abdominal pain 07/22/2015  . Nausea with vomiting 07/22/2015  . Syncope 07/21/2015  . Abnormality of gait 01/22/2014  . SBO (small bowel obstruction) (HCC) 03/09/2013  . Inguinal hernia, left - Contains sigmoid colon.  Reducible 03/09/2013  . Anemia 03/31/2009  . VENOUS STASIS ULCER 07/20/2008  . Osteoarthritis 05/06/2007  . Essential hypertension 04/24/2007  . GERD 04/24/2007    Orientation RESPIRATION BLADDER Height & Weight     Self, Time, Situation, Place  Normal Continent Weight: 115 lb 1.3 oz (52.2 kg) Height:  4' 10.5" (148.6 cm)  BEHAVIORAL SYMPTOMS/MOOD NEUROLOGICAL BOWEL NUTRITION STATUS      Continent    AMBULATORY STATUS COMMUNICATION OF NEEDS Skin   Extensive Assist Verbally Normal                        Personal Care Assistance Level of Assistance  Bathing, Dressing           Functional Limitations Info             SPECIAL CARE FACTORS FREQUENCY  PT (By licensed PT), OT (By licensed OT)     PT Frequency: daily OT Frequency: daily            Contractures Contractures Info: Not present    Additional Factors Info  Code Status, Allergies Code Status Info: DNR Allergies Info: Tramadol, Codeine, Ibuprofen, Diltiazem Hcl           Current Medications (06/08/2016):  This is the current hospital active medication list Current Facility-Administered Medications  Medication Dose Route Frequency Provider Last Rate Last Dose  . artificial tears (LACRILUBE) ophthalmic ointment   Both Eyes BID Francine GravenElizabeth S Simaan, PA-C      . dextrose 5 % and 0.45 % NaCl with KCl 40 mEq/L infusion   Intravenous Continuous Clydia LlanoMutaz Elmahi, MD 50 mL/hr at 06/08/16 1150    . famotidine (PEPCID) IVPB 20 mg premix  20 mg Intravenous Q24H Clydia LlanoMutaz Elmahi, MD   20 mg at 06/08/16 0935  . fentaNYL (SUBLIMAZE) injection 12.5 mcg  12.5 mcg Intravenous Q1H PRN Briscoe Deutscherimothy S Opyd, MD   12.5 mcg at 06/07/16 1037  . heparin injection 5,000 Units  5,000 Units Subcutaneous Q8H Briscoe Deutscherimothy S Opyd, MD   5,000 Units at 06/08/16 0520  . hydrALAZINE (APRESOLINE) injection 5  mg  5 mg Intravenous Q4H PRN Briscoe Deutscher, MD   5 mg at 06/08/16 0655  . MEDLINE mouth rinse  15 mL Mouth Rinse BID Clydia Llano, MD   15 mL at 06/08/16 0935  . nitroGLYCERIN (NITROSTAT) SL tablet 0.4 mg  0.4 mg Sublingual Q5 min PRN Briscoe Deutscher, MD      . ondansetron (ZOFRAN) tablet 4 mg  4 mg Oral Q6H PRN Briscoe Deutscher, MD       Or  . ondansetron (ZOFRAN) injection 4 mg  4 mg Intravenous Q6H PRN Lavone Neri Opyd, MD      . polyethylene glycol (MIRALAX / GLYCOLAX) packet 17 g  17 g Oral Daily Clydia Llano, MD   17 g at 06/08/16 1156  . sodium chloride flush (NS) 0.9 % injection 3 mL  3 mL Intravenous Q12H Briscoe Deutscher, MD   3 mL at 06/07/16 2111      Discharge Medications: Please see discharge summary for a list of discharge medications.  Relevant Imaging Results:  Relevant Lab Results:   Additional Information SSN: [id number]  Rondel Baton, LCSW

## 2016-06-08 NOTE — Clinical Social Work Note (Signed)
Clinical Social Work Assessment  Patient Details  Name: Erin Gibson MRN: [id number] Date of Birth: 01/27/1919  Date of referral:  06/08/16               Reason for consult:  Facility Placement                Permission sought to share information with:  Family Supports, Magazine features editoracility Contact Representative Permission granted to share information::  Yes, Verbal Permission Granted  Name::      Tana Conch(Gail Hubbard [phone])  Agency::   ([facility])  Relationship::   (daughter)  Contact Information:     Housing/Transportation Living arrangements for the past 2 months:  Single Family Home Source of Information:  Patient, Adult Children Patient Interpreter Needed:  None Criminal Activity/Legal Involvement Pertinent to Current Situation/Hospitalization:  No - Comment as needed Significant Relationships:  Adult Children (daughter) Lives with:  Self Do you feel safe going back to the place where you live?  No Need for family participation in patient care:  No (Coment)  Care giving concerns:  Daughter is wanting patient to discharge to a different facility other than [facility]   Social Worker assessment / plan:  Patient alert though not oriented fully and often confused.  CSW spoke to patient's daughter regarding placement.  Daughter is on the fence about placement because the patient has been refusing SNF from the beginning of her admission.  Daughter wishes for patient to be discharged to SNF to gain strength.  Daughter will speak with   Employment status:  Retired Health and safety inspectornsurance information:  Medicare PT Recommendations:  Skilled Nursing Facility Information / Referral to community resources:  Skilled Nursing Facility  Patient/Family's Response to care:  Daughter is agreeable to SNF though patient is on the fence about placement.  Patient/Family's Understanding of and Emotional Response to Diagnosis, Current Treatment, and Prognosis:  Family is very involved in patient's care and  understanding/reasonable regarding needed therapies and prognosis.  Patient is having difficult time transitioning into this phase of life.  CSW offered support to both patient and daughter/care giver.  Emotional Assessment Appearance:    Attitude/Demeanor/Rapport:    Affect (typically observed):  Accepting, Adaptable Orientation:  Oriented to Self, Oriented to Place, Oriented to  Time, Oriented to Situation Alcohol / Substance use:  Not Applicable Psych involvement (Current and /or in the community):  No (Comment)  Discharge Needs  Concerns to be addressed:  No discharge needs identified Readmission within the last 30 days:  No Current discharge risk:  None Barriers to Discharge:  Continued Medical Work up   [place] [employer], Davonne Baby C, LCSW 06/08/2016, 12:30 PM

## 2016-06-09 LAB — BASIC METABOLIC PANEL
Anion gap: 4 — ABNORMAL LOW (ref 5–15)
BUN: 9 mg/dL (ref 6–20)
CO2: 25 mmol/L (ref 22–32)
Calcium: 9.2 mg/dL (ref 8.9–10.3)
Chloride: 111 mmol/L (ref 101–111)
Creatinine, Ser: 1.01 mg/dL — ABNORMAL HIGH (ref 0.44–1.00)
GFR calc Af Amer: 53 mL/min — ABNORMAL LOW (ref >60)
GFR calc non Af Amer: 46 mL/min — ABNORMAL LOW (ref >60)
GLUCOSE: 103 mg/dL — AB (ref 65–99)
POTASSIUM: 4.4 mmol/L (ref 3.5–5.1)
SODIUM: 140 mmol/L (ref 135–145)

## 2016-06-09 LAB — GLUCOSE, CAPILLARY: Glucose-Capillary: 90 mg/dL (ref 65–99)

## 2016-06-09 MED ORDER — CIPROFLOXACIN HCL 500 MG PO TABS
500.0000 mg | ORAL_TABLET | Freq: Every day | ORAL | Status: DC
  Administered 2016-06-09 – 2016-06-10 (×2): 500 mg via ORAL
  Filled 2016-06-09 (×2): qty 1

## 2016-06-09 NOTE — Progress Notes (Signed)
  Subjective: She seems to be better this a.m. She did have a bowel movement yesterday. Tolerating full liquids well. She says she has a good deal of trouble walking at home and falls easily. She uses a walker.  Objective: Vital signs in last 24 hours: Temp:  [98.6 F (37 C)-99.9 F (37.7 C)] 99 F (37.2 C) (09/16 0500) Pulse Rate:  [68-81] 68 (09/16 0500) Resp:  [14-18] 18 (09/16 0500) BP: (150-166)/(73-76) 166/76 (09/16 0500) SpO2:  [96 %] 96 % (09/16 0500) Weight:  [48.2 kg (106 lb 4.8 oz)] 48.2 kg (106 lb 4.8 oz) (09/16 0500) Last BM Date: 06/08/16 1550 PO 650 urine BM 1 recorded Afebrile vital signs are stable Creatinine is stable No films today Intake/Output from previous day: 09/15 0701 - 09/16 0700 In: 2400 [P.O.:1550; I.V.:800; IV Piggyback:50] Out: 650 [Urine:650] Intake/Output this shift: No intake/output data recorded.  General appearance: alert, cooperative and no distress GI: soft, non-tender; bowel sounds normal; no masses,  no organomegaly  Lab Results:   Recent Labs  06/07/16 0455  WBC 5.7  HGB 10.7*  HCT 34.0*  PLT 194    BMET  Recent Labs  06/08/16 0647 06/09/16 0522  NA 142 140  K 3.8 4.4  CL 111 111  CO2 25 25  GLUCOSE 95 103*  BUN 11 9  CREATININE 1.01* 1.01*  CALCIUM 9.3 9.2   PT/INR No results for input(s): LABPROT, INR in the last 72 hours.   Recent Labs Lab 06/05/16 2125  AST 21  ALT 11*  ALKPHOS 99  BILITOT 0.6  PROT 5.9*  ALBUMIN 3.2*     Lipase     Component Value Date/Time   LIPASE 41 06/05/2016 2125     Studies/Results: No results found.  Medications: . artificial tears   Both Eyes BID  . ciprofloxacin  500 mg Oral Daily  . famotidine (PEPCID) IV  20 mg Intravenous Q24H  . heparin  5,000 Units Subcutaneous Q8H  . mouth rinse  15 mL Mouth Rinse BID  . polyethylene glycol  17 g Oral Daily  . sodium chloride flush  3 mL Intravenous Q12H    Assessment/Plan SBO, recurrent  - PMH hysterectomy,  open cholecystectomy, hernia repair  NGT Removed 06/07/16 - gastrografin reached the colon on gastrografin 8 h follow through - multiple BMs, distention and abdominal pain decreasing, low NGT output  FEN: IVF/full liquids ==> soft diet ID: none DVT Proph: heparin, SCD's    Plan: Soft diet further mobilization and medical management.   LOS: 3 days    Erin Gibson 06/09/2016 [phone]

## 2016-06-09 NOTE — Progress Notes (Addendum)
PROGRESS NOTE  Erin Gibson  Erin Gibson:[id number] DOB: [birth year] [name]: 06/05/2016 PCP: Rogelia Boga, MD Outpatient Specialists:  Subjective: Had multiple bowel movements since yesterday, still feels very weak. Urinalysis showed Klebsiella UTI. Started on antibiotics, watch on oral antibiotics, watch fever/nausea/vomiting overnight.  Brief Narrative:  Erin Gibson is a charming 82 y.o. female with medical history significant for CKD stage III, hypertension, multiple abdominal surgeries, and recurrent SBO who presents to the ED for evaluation of abdominal pain, nausea, and non-bloody vomiting. Patient reports occasional, fleeting pains in her lower abdomen for a few days, but continued her usual diet without incident and had a normal bowel movement on the day of presentation. Later that evening, Ms. Turck suffered a severe recurrence in the low-mid abdominal pain that was persistent. Nausea soon followed and she began vomiting. Pain, described as severe, crampy, band-like across mid-low abdomen, and without alleviating or exacerbating factors identified. She also developed central chest pain and dyspnea amidst all of this and took several SL nitroglycerin doses with relief in those symptoms. She reports some improvement in her symptoms by time of arrival and she attributes this to vomiting.    Assessment & Plan:   Principal Problem:   SBO (small bowel obstruction) (HCC) Active Problems:   Essential hypertension   GERD   Abdominal pain   CKD (chronic kidney disease), stage III   Chest pain   Protein-calorie malnutrition, severe    SBO - Pt has hx multiple abd surgeries and recurrent SBO's resolving with conservative management  - Symptoms became severe several hrs prior to arrival in ED; she had normal BM that am  - CT suggests high-grade mechanical SBO with indeterminate transition point  - Treated her briefly with NG tube to low intermittent suction. -Repeat of abdominal x-ray  showed contrast medium in the colon, had 4 bowel movements since yesterday. -NG tube removed on 9/14, diet advanced, Vance to soft diet this morning. -Continue to monitor, added to relax.  UTI -Had low-grade temperature, urinalysis grew Klebsiella pneumoniae. -Started on oral ciprofloxacin, continue for total 5-7 days.  Chest pain  - Developed with the acute abd pain, N/V and resolved after she took multiple doses SL NTG pta - This is likely secondary to the recurrent vomiting, no evidence of ischemia per EKG and troponin.   CKD stage III  - SCr 1.23 on admission, up from baseline of ~1.1 - Stop IV fluids, check BMP in a.m.  Hypertension - At goal currently - Managed at home with amlodipine and quinapril - Will treat prn with IV hydralazine while NPO   GERD - Pt has hx of GERD and gastritis, not currently under treatment  - This could be source of her chest pain in light of initial cardiac w/u being neg so far and the SBO with N/V   Hypokalemia -Potassium of 3.2, will replete.  Severe protein calorie malnutrition -Started on diet supplements  DVT prophylaxis: SQ Heparin Code Status: DNR Family Communication:  Disposition Plan:  Diet: DIET SOFT Room service appropriate? Yes; Fluid consistency: Thin  Consultants:   Gen Surgery  Procedures:   None  Antimicrobials:   None  Objective: Vitals:   06/08/16 0642 06/08/16 1407 06/08/16 2100 06/09/16 0500  BP: (!) 190/85 (!) 151/73 (!) 150/75 (!) 166/76  Pulse: 72 81 69 68  Resp: 18 14 16 18   Temp: 98.2 F (36.8 C) 98.6 F (37 C) 99.9 F (37.7 C) 99 F (37.2 C)  TempSrc: Oral Oral  SpO2: 95% 96% 96% 96%  Weight:    48.2 kg (106 lb 4.8 oz)  Height:        Intake/Output Summary (Last 24 hours) at 06/09/16 1231 Last data filed at 06/09/16 0934  Gross per 24 hour  Intake             2070 ml  Output              650 ml  Net             1420 ml   Filed Weights   06/06/16 0325 06/09/16 0500  Weight: 52.2  kg (115 lb 1.3 oz) 48.2 kg (106 lb 4.8 oz)    Examination: General exam: Appears calm and comfortable  Respiratory system: Clear to auscultation. Respiratory effort normal. Cardiovascular system: S1 & S2 heard, RRR. No JVD, murmurs, rubs, gallops or clicks. No pedal edema. Gastrointestinal system: Abdomen is nondistended, soft and nontender. No organomegaly or masses felt. Normal bowel sounds heard. Central nervous system: Alert and oriented. No focal neurological deficits. Extremities: Symmetric 5 x 5 power. Skin: No rashes, lesions or ulcers Psychiatry: Judgement and insight appear normal. Mood & affect appropriate.   Data Reviewed: I have personally reviewed following labs and imaging studies  CBC:  Recent Labs Lab 06/05/16 2125 06/07/16 0455  WBC 10.4 5.7  HGB 12.4 10.7*  HCT 39.3 34.0*  MCV 88.5 88.1  PLT 241 194   Basic Metabolic Panel:  Recent Labs Lab 06/05/16 2125 06/06/16 0827 06/07/16 0455 06/08/16 0647 06/09/16 0522  NA 140 139 142 142 140  K 4.5 4.4 3.2* 3.8 4.4  CL 110 109 111 111 111  CO2 24 22 26 25 25   GLUCOSE 120* 90 104* 95 103*  BUN 31* 30* 17 11 9   CREATININE 1.23* 1.18* 1.08* 1.01* 1.01*  CALCIUM 8.8* 9.0 9.1 9.3 9.2   GFR: Estimated Creatinine Clearance: 22.1 mL/min (by C-G formula based on SCr of 1.01 mg/dL (H)). Liver Function Tests:  Recent Labs Lab 06/05/16 2125  AST 21  ALT 11*  ALKPHOS 99  BILITOT 0.6  PROT 5.9*  ALBUMIN 3.2*    Recent Labs Lab 06/05/16 2125  LIPASE 41   No results for input(s): AMMONIA in the last 168 hours. Coagulation Profile: No results for input(s): INR, PROTIME in the last 168 hours. Cardiac Enzymes:  Recent Labs Lab 06/06/16 0355 06/06/16 0827 06/06/16 1334  TROPONINI <0.03 <0.03 <0.03   BNP (last 3 results)  Recent Labs  02/16/16 1346  PROBNP 312.0*   HbA1C: No results for input(s): HGBA1C in the last 72 hours. CBG:  Recent Labs Lab 06/09/16 0753  GLUCAP 90   Lipid  Profile: No results for input(s): CHOL, HDL, LDLCALC, TRIG, CHOLHDL, LDLDIRECT in the last 72 hours. Thyroid Function Tests: No results for input(s): TSH, T4TOTAL, FREET4, T3FREE, THYROIDAB in the last 72 hours. Anemia Panel: No results for input(s): VITAMINB12, FOLATE, FERRITIN, TIBC, IRON, RETICCTPCT in the last 72 hours. Urine analysis:    Component Value Date/Time   COLORURINE YELLOW 06/05/2016 2245   APPEARANCEUR CLOUDY (A) 06/05/2016 2245   LABSPEC 1.015 06/05/2016 2245   PHURINE 5.0 06/05/2016 2245   GLUCOSEU NEGATIVE 06/05/2016 2245   HGBUR TRACE (A) 06/05/2016 2245   BILIRUBINUR NEGATIVE 06/05/2016 2245   KETONESUR NEGATIVE 06/05/2016 2245   PROTEINUR NEGATIVE 06/05/2016 2245   UROBILINOGEN 0.2 07/21/2015 2225   NITRITE POSITIVE (A) 06/05/2016 2245   LEUKOCYTESUR TRACE (A) 06/05/2016 2245   Sepsis Labs: @LABRCNTIP (procalcitonin:4,lacticidven:4)  )  Recent Results (from the past 240 hour(s))  Urine culture     Status: Abnormal   Collection Time: 06/05/16 10:45 PM  Result Value Ref Range Status   Specimen Description URINE, RANDOM  Final   Special Requests NONE  Final   Culture >=100,000 COLONIES/mL KLEBSIELLA PNEUMONIAE (A)  Final   Report Status 06/08/2016 FINAL  Final   Organism ID, Bacteria KLEBSIELLA PNEUMONIAE (A)  Final      Susceptibility   Klebsiella pneumoniae - MIC*    AMPICILLIN 16 RESISTANT Resistant     CEFAZOLIN <=4 SENSITIVE Sensitive     CEFTRIAXONE <=1 SENSITIVE Sensitive     CIPROFLOXACIN <=0.25 SENSITIVE Sensitive     GENTAMICIN <=1 SENSITIVE Sensitive     IMIPENEM <=0.25 SENSITIVE Sensitive     NITROFURANTOIN 64 INTERMEDIATE Intermediate     TRIMETH/SULFA <=20 SENSITIVE Sensitive     AMPICILLIN/SULBACTAM 4 SENSITIVE Sensitive     PIP/TAZO <=4 SENSITIVE Sensitive     Extended ESBL NEGATIVE Sensitive     * >=100,000 COLONIES/mL KLEBSIELLA PNEUMONIAE     Invalid input(s): PROCALCITONIN, LACTICACIDVEN   Radiology Studies: No results  found.      Scheduled Meds: . artificial tears   Both Eyes BID  . ciprofloxacin  500 mg Oral Daily  . famotidine (PEPCID) IV  20 mg Intravenous Q24H  . heparin  5,000 Units Subcutaneous Q8H  . mouth rinse  15 mL Mouth Rinse BID  . polyethylene glycol  17 g Oral Daily  . sodium chloride flush  3 mL Intravenous Q12H   Continuous Infusions: . dextrose 5 % and 0.45 % NaCl with KCl 40 mEq/L 50 mL/hr at 06/09/16 1207     LOS: 3 days    Time spent: 35 minutes    Bryony Kaman A, MD [facility] Pager [phone]  If 7PM-7AM, please contact night-coverage [email or web address] Password [facility] 06/09/2016, 12:31 PM

## 2016-06-10 DIAGNOSIS — Z7982 Long term (current) use of aspirin: Secondary | ICD-10-CM | POA: Diagnosis not present

## 2016-06-10 DIAGNOSIS — E876 Hypokalemia: Secondary | ICD-10-CM | POA: Diagnosis present

## 2016-06-10 DIAGNOSIS — I10 Essential (primary) hypertension: Secondary | ICD-10-CM | POA: Diagnosis not present

## 2016-06-10 DIAGNOSIS — M25422 Effusion, left elbow: Secondary | ICD-10-CM | POA: Diagnosis not present

## 2016-06-10 DIAGNOSIS — R269 Unspecified abnormalities of gait and mobility: Secondary | ICD-10-CM | POA: Diagnosis not present

## 2016-06-10 DIAGNOSIS — R262 Difficulty in walking, not elsewhere classified: Secondary | ICD-10-CM | POA: Diagnosis present

## 2016-06-10 DIAGNOSIS — R2681 Unsteadiness on feet: Secondary | ICD-10-CM | POA: Diagnosis not present

## 2016-06-10 DIAGNOSIS — D638 Anemia in other chronic diseases classified elsewhere: Secondary | ICD-10-CM | POA: Diagnosis not present

## 2016-06-10 DIAGNOSIS — Z6821 Body mass index (BMI) 21.0-21.9, adult: Secondary | ICD-10-CM | POA: Diagnosis not present

## 2016-06-10 DIAGNOSIS — M79632 Pain in left forearm: Secondary | ICD-10-CM | POA: Diagnosis not present

## 2016-06-10 DIAGNOSIS — Z5189 Encounter for other specified aftercare: Secondary | ICD-10-CM | POA: Diagnosis not present

## 2016-06-10 DIAGNOSIS — R600 Localized edema: Secondary | ICD-10-CM | POA: Diagnosis not present

## 2016-06-10 DIAGNOSIS — K5669 Other intestinal obstruction: Secondary | ICD-10-CM | POA: Diagnosis not present

## 2016-06-10 DIAGNOSIS — Z9181 History of falling: Secondary | ICD-10-CM | POA: Diagnosis not present

## 2016-06-10 DIAGNOSIS — M79622 Pain in left upper arm: Secondary | ICD-10-CM | POA: Diagnosis not present

## 2016-06-10 DIAGNOSIS — N183 Chronic kidney disease, stage 3 (moderate): Secondary | ICD-10-CM | POA: Diagnosis not present

## 2016-06-10 DIAGNOSIS — E43 Unspecified severe protein-calorie malnutrition: Secondary | ICD-10-CM | POA: Diagnosis not present

## 2016-06-10 DIAGNOSIS — I129 Hypertensive chronic kidney disease with stage 1 through stage 4 chronic kidney disease, or unspecified chronic kidney disease: Secondary | ICD-10-CM | POA: Diagnosis present

## 2016-06-10 DIAGNOSIS — R100 Acute abdomen: Secondary | ICD-10-CM | POA: Diagnosis not present

## 2016-06-10 DIAGNOSIS — M150 Primary generalized (osteo)arthritis: Secondary | ICD-10-CM | POA: Diagnosis not present

## 2016-06-10 DIAGNOSIS — R109 Unspecified abdominal pain: Secondary | ICD-10-CM | POA: Diagnosis present

## 2016-06-10 DIAGNOSIS — Z23 Encounter for immunization: Secondary | ICD-10-CM | POA: Diagnosis not present

## 2016-06-10 DIAGNOSIS — M1990 Unspecified osteoarthritis, unspecified site: Secondary | ICD-10-CM | POA: Diagnosis present

## 2016-06-10 DIAGNOSIS — E440 Moderate protein-calorie malnutrition: Secondary | ICD-10-CM | POA: Diagnosis not present

## 2016-06-10 DIAGNOSIS — Z66 Do not resuscitate: Secondary | ICD-10-CM | POA: Diagnosis present

## 2016-06-10 DIAGNOSIS — N390 Urinary tract infection, site not specified: Secondary | ICD-10-CM | POA: Diagnosis not present

## 2016-06-10 DIAGNOSIS — R1013 Epigastric pain: Secondary | ICD-10-CM | POA: Diagnosis not present

## 2016-06-10 DIAGNOSIS — R531 Weakness: Secondary | ICD-10-CM | POA: Diagnosis not present

## 2016-06-10 DIAGNOSIS — M6281 Muscle weakness (generalized): Secondary | ICD-10-CM | POA: Diagnosis not present

## 2016-06-10 DIAGNOSIS — K5909 Other constipation: Secondary | ICD-10-CM | POA: Diagnosis not present

## 2016-06-10 DIAGNOSIS — K219 Gastro-esophageal reflux disease without esophagitis: Secondary | ICD-10-CM | POA: Diagnosis not present

## 2016-06-10 DIAGNOSIS — F419 Anxiety disorder, unspecified: Secondary | ICD-10-CM | POA: Diagnosis not present

## 2016-06-10 DIAGNOSIS — R5381 Other malaise: Secondary | ICD-10-CM | POA: Diagnosis not present

## 2016-06-10 DIAGNOSIS — G4700 Insomnia, unspecified: Secondary | ICD-10-CM | POA: Diagnosis not present

## 2016-06-10 DIAGNOSIS — M25532 Pain in left wrist: Secondary | ICD-10-CM | POA: Diagnosis not present

## 2016-06-10 DIAGNOSIS — F411 Generalized anxiety disorder: Secondary | ICD-10-CM | POA: Diagnosis not present

## 2016-06-10 DIAGNOSIS — B961 Klebsiella pneumoniae [K. pneumoniae] as the cause of diseases classified elsewhere: Secondary | ICD-10-CM | POA: Diagnosis not present

## 2016-06-10 DIAGNOSIS — K565 Intestinal adhesions [bands] with obstruction (postprocedural) (postinfection): Secondary | ICD-10-CM | POA: Diagnosis present

## 2016-06-10 DIAGNOSIS — R278 Other lack of coordination: Secondary | ICD-10-CM | POA: Diagnosis not present

## 2016-06-10 LAB — GLUCOSE, CAPILLARY: GLUCOSE-CAPILLARY: 99 mg/dL (ref 65–99)

## 2016-06-10 MED ORDER — CEFUROXIME AXETIL 500 MG PO TABS: 500.0000 mg | ORAL_TABLET | Freq: Two times a day (BID) | ORAL | 0 refills | Status: DC

## 2016-06-10 NOTE — Progress Notes (Signed)
[facility]. PTAR is her to transport patient.

## 2016-06-10 NOTE — Clinical Social Work Note (Signed)
CSW met with pt to give bed offers, and pt chose [facility]. CSW spoke with Jennifer with [facility] and confirmed that pt may discharge to SNF today at 1pm. CSW contacted pt's daughter to inform pt discharging to SNF today. CSW also paged MD and requested discharge summary and orders for pt. PTAR contacted to schedule pt transport to SNF and request made for RN to contact SNF and give orders.  

## 2016-06-10 NOTE — Discharge Summary (Signed)
Physician Discharge Summary  Erin Gibson:[id number] DOB: [birth year] [name]: 06/05/2016  PCP: Rogelia Boga, MD  Admit date: 06/05/2016 Discharge date: 06/10/2016  Admitted From: Home Disposition: SNF  Recommendations for Outpatient Follow-up:  1. Follow up with PCP in 1-2 weeks 2. Please obtain BMP/CBC in one week  Home Health:NA Equipment/Devices: NA  Discharge Condition: Stable CODE STATUS: DNR Diet recommendation: Heart Healthy  Brief/Interim Summary: Erin W Allenis a charming 82 y.o.femalewith medical history significant for CKD stage III, hypertension, multiple abdominal surgeries, and recurrent SBO who presents to the ED for evaluation of abdominal pain, nausea, and non-bloody vomiting. Patient reports occasional, fleeting pains in her lower abdomen for a few days, but continued her usual diet without incident and had a normal bowel movement on the day of presentation. Later that evening, Erin Gibson suffered a severe recurrence in the low-mid abdominal pain that was persistent. Nausea soon followed and she began vomiting. Pain, described as severe, crampy, band-like across mid-low abdomen, and without alleviating or exacerbating factors identified. She also developed central chest pain and dyspnea amidst all of this and took several SL nitroglycerin doses with relief in those symptoms. She reports some improvement in her symptoms by time of arrival and she attributes this to vomiting.  Discharge Diagnoses:  Principal Problem:   SBO (small bowel obstruction) (HCC) Active Problems:   Essential hypertension   GERD   Abdominal pain   CKD (chronic kidney disease), stage III   Chest pain   Protein-calorie malnutrition, severe   SBO - Pt has hx multiple abd surgeries and recurrent SBO's resolving with conservative management  - Symptoms became severe several hrs prior to arrival in ED; she had normal BM that am  - CT suggests high-grade mechanical SBO with  indeterminate transition point  - Treated Conservatively with NG tube to low intermittent suction. -Repeat of abdominal x-ray showed contrast medium in the colon, had 4 bowel movements. -NG tube removed on 9/14, diet advanced, tolerated diet without any problems. -Added MiraLAX, on discharge encouraged good fluid intake and ambulation.  Klebsiella UTI -Had low-grade temperature, urinalysis grew Klebsiella pneumoniae. -Discharged on Ceftin for 5 more days.  Chest pain  - Developed with the acute abd pain, N/V and resolved after she took multiple doses SL NTG pta - This is likely secondary to the recurrent vomiting, no evidence of ischemia per EKG and troponin.  CKD stage III  - SCr 1.23 on admission, up from baseline of ~1.1 - Is on IV fluids briefly, this is discontinued, Lasix restarted on discharge.  Hypertension - At goal currently - Managed at home with amlodipine and quinapril  GERD - Pt has hx of GERD and gastritis, not currently under treatment  - This could be source of her chest pain in light of initial cardiac w/u being neg so far and the SBO with N/V   Hypokalemia -Potassium was 3.2, this is repleted with oral supplements.  Severe protein calorie malnutrition -Started on diet supplements.   Discharge Instructions  Discharge Instructions    Diet - low sodium heart healthy    Complete by:  As directed    Increase activity slowly    Complete by:  As directed        Medication List    TAKE these medications   acetaminophen 325 MG tablet Commonly known as:  TYLENOL Take 2 tablets (650 mg total) by mouth every 4 (four) hours as needed for mild pain or moderate pain.   amLODipine 2.5 MG  tablet Commonly known as:  NORVASC Take 1 tablet (2.5 mg total) by mouth daily.   aspirin EC 81 MG tablet Take 81 mg by mouth every morning.   cefUROXime 500 MG tablet Commonly known as:  CEFTIN Take 1 tablet (500 mg total) by mouth 2 (two) times daily with a  meal.   feeding supplement (ENSURE ENLIVE) Liqd Take 237 mLs by mouth 2 (two) times daily between meals.   furosemide 40 MG tablet Commonly known as:  LASIX TAKE 1/2 TABLET (=20MG )    DAILY   isosorbide mononitrate 30 MG 24 hr tablet Commonly known as:  IMDUR Take 1 tablet (30 mg total) by mouth daily.   nitroGLYCERIN 0.4 MG SL tablet Commonly known as:  NITROSTAT Place 1 tablet (0.4 mg total) under the tongue every 5 (five) minutes as needed for chest pain.   polyethylene glycol powder powder Commonly known as:  GLYCOLAX/MIRALAX MIX 17GM IN LIQUID AND     DRINK TWO TIMES A DAY   PRESERVISION AREDS 2 Caps Take 1 capsule by mouth daily.   quinapril 40 MG tablet Commonly known as:  ACCUPRIL Take 0.5 tablets (20 mg total) by mouth daily.   SYSTANE 0.4-0.3 % Soln Generic drug:  Polyethyl Glycol-Propyl Glycol Place 2 drops into both eyes 2 (two) times daily.   Vitamin B-12 2500 MCG Subl Place 1 tablet under the tongue every morning.       Allergies  Allergen Reactions  . Tramadol Other (See Comments)    Auditory and visual hallucinations  . Codeine Nausea And Vomiting    Violently ill  . Ibuprofen Nausea And Vomiting  . Diltiazem Hcl Other (See Comments)    Not sure about this reaction    Consultations:  None   Procedures/Studies: Ct Abdomen Pelvis Wo Contrast  Result Date: 06/06/2016 CLINICAL DATA:  Initial evaluation for acute abdominal pain with vomiting. Concern for possible small bowel obstruction. EXAM: CT ABDOMEN AND PELVIS WITHOUT CONTRAST TECHNIQUE: Multidetector CT imaging of the abdomen and pelvis was performed following the standard protocol without IV contrast. COMPARISON:  Prior CT from 07/21/2015. FINDINGS: Lower chest: Few scattered subcentimeter nodular densities within the right lower lobe measuring 5 mm are stable from prior, most likely benign. Scattered atelectatic changes present within the left lung base. Visualized lungs are otherwise clear.  Prominent calcifications noted at the aortic valve. No pleural or pericardial effusion. Hepatobiliary: Limited noncontrast evaluation of the liver is grossly unremarkable. No evidence for cholelithiasis. No overt biliary dilatation. Pancreas: Pancreas grossly unremarkable without acute inflammatory changes. Spleen: Spleen within normal limits. Adrenals/Urinary Tract: Adrenal glands grossly unremarkable. Kidneys equal in measuring up to 8 mm, similar to prior, likely small proteinaceous and/ or hemorrhagic cysts. Peripherally calcified exophytic cyst at the upper pole of the right kidney measures 5.8 cm on today's exam. Bladder decompressed without acute abnormality. Stomach/Bowel: Stomach within normal limits. Multiple dilated loops of small bowel are seen within the upper and lower abdomen. These measure up to 4.4 cm in diameter and demonstrate associated internal air-fluid levels. The terminal ileum is decompressed distally. Findings compatible with mechanical small bowel obstruction. Exact transition point is difficult to discern on this noncontrast examination. Note is made of a left inguinal hernia containing fluid and a loop of bowel. This is appears to be a portion of the colon, not a small bowel loop. Colon itself is of relatively normal caliber without acute abnormality. Additional fat containing ventral hernia noted more superiorly within the abdomen (series 2, image  24). Vascular/Lymphatic: Prominent aorto bi-iliac atherosclerotic disease identified. Intra-abdominal aorta dilated at 3.4 cm. Node definite pathologically enlarged lymph nodes identified on this noncontrast examination. Reproductive: Uterus and ovaries not well seen, and may be absent. He should be it had he had Other: Moderate volume free fluid within the abdomen, likely reactive. No definite free intraperitoneal air. Musculoskeletal: Scoliosis with multilevel degenerative spondylolysis present. No acute osseous abnormality. No worrisome  lytic or blastic osseous lesions. IMPRESSION: 1. Findings compatible with high-grade mechanical small bowel obstruction. Exact transition is difficult to discern on this examination, and not definitely identified. 2. Moderate volume free fluid within the abdomen, likely reactive. 3. Small fluid and bowel containing left inguinal hernia. This appears to contain a loop of colon, not small bowel, and is favored to not reflect the point of obstruction. 4. Stable 3.4 cm infrarenal abdominal aortic aneurysm. Recommend followup by ultrasound in 3 years. This recommendation follows ACR consensus guidelines: White Paper of the ACR Incidental Findings Committee II on Vascular Findings. [place] Coll Radiol 2013; 10:789-794 Electronically Signed   By: Rise Mu M.D.   On: 06/06/2016 00:44   Dg Abd 1 View  Result Date: 06/06/2016 CLINICAL DATA:  Nasogastric tube placement for small bowel obstruction. EXAM: ABDOMEN - 1 VIEW COMPARISON:  CT 06/05/2016. Fluoroscopy Time:  1 minutes and 54 seconds Radiation Exposure Index (if provided by the fluoroscopic device): 7.2 mGy Number of Acquired Spot Images: 0 FINDINGS: Nasogastric tube placement was performed by the radiology technologist. A 14 French nasogastric tube was placed. 90 ml of Gastrografin was utilized to aid placement of the nasogastric tube. A single spot image after tube placement demonstrates the tip of the tube in the left upper quadrant of the abdomen. IMPRESSION: Nasogastric tube placement under fluoroscopy. Electronically Signed   By: Carey Bullocks M.D.   On: 06/06/2016 08:40   Dg Abd Portable 1v-small Bowel Obstruction Protocol-initial, 8 Hr Delay  Result Date: 06/06/2016 CLINICAL DATA:  Small bowel obstruction.  8 hour delayed film. EXAM: PORTABLE ABDOMEN - 1 VIEW COMPARISON:  CT scan from 06/05/2016 FINDINGS: Portable supine view the abdomen at 1743 hours shows diffuse gaseous distension of small bowel and colon. There is contrast material in the  right colon, splenic flexure, descending colon and rectum. In CT tip overlies the mid stomach. IMPRESSION: Contrast material is visualized in the colon with diffuse gaseous distention of large and small bowel. Electronically Signed   By: Kennith Center M.D.   On: 06/06/2016 17:55   Dg Vangie Bicker G Tube Plc W/fl-no Rad  Result Date: 06/06/2016 CLINICAL DATA:  NASO G TUBE PLACEMENT WITH FLUORO Fluoroscopy was utilized by the requesting physician.  No radiographic interpretation.    (Echo, Carotid, EGD, Colonoscopy, ERCP)    Subjective:   Discharge Exam: Vitals:   06/09/16 2130 06/10/16 0530  BP: (!) 143/58 140/62  Pulse: 69 74  Resp: 17 16  Temp: 98.5 F (36.9 C) 98.3 F (36.8 C)   Vitals:   06/09/16 0500 06/09/16 1357 06/09/16 2130 06/10/16 0530  BP: (!) 166/76 (!) 143/76 (!) 143/58 140/62  Pulse: 68 72 69 74  Resp: 18 16 17 16   Temp: 99 F (37.2 C) 98.9 F (37.2 C) 98.5 F (36.9 C) 98.3 F (36.8 C)  TempSrc:  Oral Oral Oral  SpO2: 96% 95% 95% 96%  Weight: 48.2 kg (106 lb 4.8 oz)   47.3 kg (104 lb 4.4 oz)  Height:        General: Pt is alert,  awake, not in acute distress Cardiovascular: RRR, S1/S2 +, no rubs, no gallops Respiratory: CTA bilaterally, no wheezing, no rhonchi Abdominal: Soft, NT, ND, bowel sounds + Extremities: no edema, no cyanosis    The results of significant diagnostics from this hospitalization (including imaging, microbiology, ancillary and laboratory) are listed below for reference.     Microbiology: Recent Results (from the past 240 hour(s))  Urine culture     Status: Abnormal   Collection Time: 06/05/16 10:45 PM  Result Value Ref Range Status   Specimen Description URINE, RANDOM  Final   Special Requests NONE  Final   Culture >=100,000 COLONIES/mL KLEBSIELLA PNEUMONIAE (A)  Final   Report Status 06/08/2016 FINAL  Final   Organism ID, Bacteria KLEBSIELLA PNEUMONIAE (A)  Final      Susceptibility   Klebsiella pneumoniae - MIC*    AMPICILLIN  16 RESISTANT Resistant     CEFAZOLIN <=4 SENSITIVE Sensitive     CEFTRIAXONE <=1 SENSITIVE Sensitive     CIPROFLOXACIN <=0.25 SENSITIVE Sensitive     GENTAMICIN <=1 SENSITIVE Sensitive     IMIPENEM <=0.25 SENSITIVE Sensitive     NITROFURANTOIN 64 INTERMEDIATE Intermediate     TRIMETH/SULFA <=20 SENSITIVE Sensitive     AMPICILLIN/SULBACTAM 4 SENSITIVE Sensitive     PIP/TAZO <=4 SENSITIVE Sensitive     Extended ESBL NEGATIVE Sensitive     * >=100,000 COLONIES/mL KLEBSIELLA PNEUMONIAE     Labs: BNP (last 3 results)  Recent Labs  02/16/16 1625  BNP 280.7*   Basic Metabolic Panel:  Recent Labs Lab 06/05/16 2125 06/06/16 0827 06/07/16 0455 06/08/16 0647 06/09/16 0522  NA 140 139 142 142 140  K 4.5 4.4 3.2* 3.8 4.4  CL 110 109 111 111 111  CO2 24 22 26 25 25   GLUCOSE 120* 90 104* 95 103*  BUN 31* 30* 17 11 9   CREATININE 1.23* 1.18* 1.08* 1.01* 1.01*  CALCIUM 8.8* 9.0 9.1 9.3 9.2   Liver Function Tests:  Recent Labs Lab 06/05/16 2125  AST 21  ALT 11*  ALKPHOS 99  BILITOT 0.6  PROT 5.9*  ALBUMIN 3.2*    Recent Labs Lab 06/05/16 2125  LIPASE 41   No results for input(s): AMMONIA in the last 168 hours. CBC:  Recent Labs Lab 06/05/16 2125 06/07/16 0455  WBC 10.4 5.7  HGB 12.4 10.7*  HCT 39.3 34.0*  MCV 88.5 88.1  PLT 241 194   Cardiac Enzymes:  Recent Labs Lab 06/06/16 0355 06/06/16 0827 06/06/16 1334  TROPONINI <0.03 <0.03 <0.03   BNP: Invalid input(s): POCBNP CBG:  Recent Labs Lab 06/09/16 0753 06/10/16 0820  GLUCAP 90 99   D-Dimer No results for input(s): DDIMER in the last 72 hours. Hgb A1c No results for input(s): HGBA1C in the last 72 hours. Lipid Profile No results for input(s): CHOL, HDL, LDLCALC, TRIG, CHOLHDL, LDLDIRECT in the last 72 hours. Thyroid function studies No results for input(s): TSH, T4TOTAL, T3FREE, THYROIDAB in the last 72 hours.  Invalid input(s): FREET3 Anemia work up No results for input(s):  VITAMINB12, FOLATE, FERRITIN, TIBC, IRON, RETICCTPCT in the last 72 hours. Urinalysis    Component Value Date/Time   COLORURINE YELLOW 06/05/2016 2245   APPEARANCEUR CLOUDY (A) 06/05/2016 2245   LABSPEC 1.015 06/05/2016 2245   PHURINE 5.0 06/05/2016 2245   GLUCOSEU NEGATIVE 06/05/2016 2245   HGBUR TRACE (A) 06/05/2016 2245   BILIRUBINUR NEGATIVE 06/05/2016 2245   KETONESUR NEGATIVE 06/05/2016 2245   PROTEINUR NEGATIVE 06/05/2016 2245   UROBILINOGEN 0.2  07/21/2015 2225   NITRITE POSITIVE (A) 06/05/2016 2245   LEUKOCYTESUR TRACE (A) 06/05/2016 2245   Sepsis Labs Invalid input(s): PROCALCITONIN,  WBC,  LACTICIDVEN Microbiology Recent Results (from the past 240 hour(s))  Urine culture     Status: Abnormal   Collection Time: 06/05/16 10:45 PM  Result Value Ref Range Status   Specimen Description URINE, RANDOM  Final   Special Requests NONE  Final   Culture >=100,000 COLONIES/mL KLEBSIELLA PNEUMONIAE (A)  Final   Report Status 06/08/2016 FINAL  Final   Organism ID, Bacteria KLEBSIELLA PNEUMONIAE (A)  Final      Susceptibility   Klebsiella pneumoniae - MIC*    AMPICILLIN 16 RESISTANT Resistant     CEFAZOLIN <=4 SENSITIVE Sensitive     CEFTRIAXONE <=1 SENSITIVE Sensitive     CIPROFLOXACIN <=0.25 SENSITIVE Sensitive     GENTAMICIN <=1 SENSITIVE Sensitive     IMIPENEM <=0.25 SENSITIVE Sensitive     NITROFURANTOIN 64 INTERMEDIATE Intermediate     TRIMETH/SULFA <=20 SENSITIVE Sensitive     AMPICILLIN/SULBACTAM 4 SENSITIVE Sensitive     PIP/TAZO <=4 SENSITIVE Sensitive     Extended ESBL NEGATIVE Sensitive     * >=100,000 COLONIES/mL KLEBSIELLA PNEUMONIAE     Time coordinating discharge: Over 30 minutes  SIGNED:   Clint Lipps, MD  [facility] 06/10/2016, 10:54 AM Pager   If 7PM-7AM, please contact night-coverage [email or web address] Password [email or web address]

## 2016-06-13 ENCOUNTER — Encounter: Payer: Self-pay | Admitting: Internal Medicine

## 2016-06-13 ENCOUNTER — Non-Acute Institutional Stay (SKILLED_NURSING_FACILITY): Payer: Medicare Other | Admitting: Internal Medicine

## 2016-06-13 DIAGNOSIS — N390 Urinary tract infection, site not specified: Secondary | ICD-10-CM

## 2016-06-13 DIAGNOSIS — K5669 Other intestinal obstruction: Secondary | ICD-10-CM | POA: Diagnosis not present

## 2016-06-13 DIAGNOSIS — B961 Klebsiella pneumoniae [K. pneumoniae] as the cause of diseases classified elsewhere: Secondary | ICD-10-CM

## 2016-06-13 DIAGNOSIS — F411 Generalized anxiety disorder: Secondary | ICD-10-CM

## 2016-06-13 DIAGNOSIS — R5381 Other malaise: Secondary | ICD-10-CM | POA: Diagnosis not present

## 2016-06-13 DIAGNOSIS — B9689 Other specified bacterial agents as the cause of diseases classified elsewhere: Secondary | ICD-10-CM

## 2016-06-13 DIAGNOSIS — M25422 Effusion, left elbow: Secondary | ICD-10-CM

## 2016-06-13 DIAGNOSIS — M25432 Effusion, left wrist: Secondary | ICD-10-CM

## 2016-06-13 DIAGNOSIS — N183 Chronic kidney disease, stage 3 unspecified: Secondary | ICD-10-CM

## 2016-06-13 DIAGNOSIS — I2089 Other forms of angina pectoris: Secondary | ICD-10-CM

## 2016-06-13 DIAGNOSIS — K5909 Other constipation: Secondary | ICD-10-CM

## 2016-06-13 DIAGNOSIS — I10 Essential (primary) hypertension: Secondary | ICD-10-CM

## 2016-06-13 DIAGNOSIS — E440 Moderate protein-calorie malnutrition: Secondary | ICD-10-CM | POA: Diagnosis not present

## 2016-06-13 DIAGNOSIS — D638 Anemia in other chronic diseases classified elsewhere: Secondary | ICD-10-CM

## 2016-06-13 DIAGNOSIS — M25532 Pain in left wrist: Secondary | ICD-10-CM | POA: Diagnosis not present

## 2016-06-13 DIAGNOSIS — R600 Localized edema: Secondary | ICD-10-CM

## 2016-06-13 DIAGNOSIS — I208 Other forms of angina pectoris: Secondary | ICD-10-CM

## 2016-06-13 DIAGNOSIS — K56609 Unspecified intestinal obstruction, unspecified as to partial versus complete obstruction: Secondary | ICD-10-CM

## 2016-06-13 NOTE — Progress Notes (Signed)
[facility]  PCP: [name],[name], MD   Code Status: DNR  Goals of care: Advanced Directive information Advanced Directives 06/13/2016  Does patient have an advance directive? Yes  Type of Advance Directive Out of facility DNR (pink MOST or yellow form)  Does patient want to make changes to advanced directive? No - Patient declined  Copy of advanced directive(s) in chart? Yes  Would patient like information on creating an advanced directive? -       Extended Emergency Contact Information Primary Emergency Contact: Sharman CrateJones,Brenda  [place] Home Phone: [phone] Relation: Neighbor Secondary Emergency Contact: Dwiggins,Gail Address: [address]          [place], [place] Home Phone: [phone] Work Phone: [phone] Mobile Phone: [phone] Relation: Daughter   Allergies  Allergen Reactions  . Tramadol Other (See Comments)    Auditory and visual hallucinations  . Codeine Nausea And Vomiting    Violently ill  . Ibuprofen Nausea And Vomiting  . Diltiazem Hcl Other (See Comments)    Not sure about this reaction    Chief Complaint  Patient presents with  . New Admit To SNF    New Admission Visit     HPI:  Patient is a 82 y.o. female seen today for short term rehabilitation post hospital admission from 06/05/16-06/10/16 with small bowel obstruction. She had NG tube placed for decompression and made clinical improvement and her diet was advanced as tolerated. She was also started on antibiotic for klebsiella UTI this admission. She had chest pain and cardiac ischemia was ruled out. She received iv fluids for acute on ckd. She has PMH of ckd stage 3, HTN, recurrent SBO, multiple abdominal surgeries among others. She is seen in her room today. She complaints of acute pain to her left elbow x 3 days and mentions her elbow hitting against the bed rail. She started with left wrist pain yesterday. She is concerned  about cellulitis in her arm. Denies fever or chills. She has been anxious lately and feels that staff have been rude with her.   Review of Systems:  Constitutional: Negative for fever, chills, diaphoresis. positive for malaise HENT: Negative for headache, congestion, nasal discharge, difficulty swallowing.  She is hard of hearing.  Eyes: Negative for double vision and discharge.  Respiratory: Negative for cough, wheezing.  positive for dyspnea with exertion Cardiovascular: Negative for palpitations. Positive for chest pain and leg swelling. SL NTG helps with her chest pain.  Gastrointestinal: Negative for nausea, vomiting, heartburn and abdominal pain. She had bowel movement yesterday.  Genitourinary: Negative for flank pain. has occasional dysuria.  Musculoskeletal: Negative for fall. positive for arthritis pain to her knee Skin: Negative for itching, rash.  Neurological: Negative for dizziness. Psychiatric/Behavioral: Negative for depression    Past Medical History:  Diagnosis Date  . ANEMIA 03/31/2009  . Chronic kidney disease   . DEGENERATIVE JOINT DISEASE, GENERALIZED 05/06/2007  . Diverticulosis of sigmoid colon    Colonoscopy 2011  . DVT (deep venous thrombosis) (HCC)    hx of  . GERD 04/24/2007  . HYPERTENSION 04/24/2007  . VENOUS STASIS ULCER 07/20/2008   Past Surgical History:  Procedure Laterality Date  . ABDOMINAL HYSTERECTOMY     Completion hysterectomy  . CATARACT EXTRACTION    . CHOLECYSTECTOMY OPEN    . HERNIA REPAIR    . PARTIAL HYSTERECTOMY     Salpingo-oophorectomy as well  . TOTAL KNEE ARTHROPLASTY     x2  Social History:   reports that she has never smoked. She has never used smokeless tobacco. She reports that she does not drink alcohol or use drugs.  No family history on file.  Medications:   Medication List       Accurate as of 06/13/16 12:32 PM. Always use your most recent med list.          acetaminophen 325 MG tablet Commonly known as:   TYLENOL Take 2 tablets (650 mg total) by mouth every 4 (four) hours as needed for mild pain or moderate pain.   amLODipine 2.5 MG tablet Commonly known as:  NORVASC Take 1 tablet (2.5 mg total) by mouth daily.   aspirin EC 81 MG tablet Take 81 mg by mouth every morning.   cefUROXime 500 MG tablet Commonly known as:  CEFTIN Take 1 tablet (500 mg total) by mouth 2 (two) times daily with a meal.   furosemide 40 MG tablet Commonly known as:  LASIX TAKE 1/2 TABLET (=20MG )    DAILY   isosorbide mononitrate 30 MG 24 hr tablet Commonly known as:  IMDUR Take 1 tablet (30 mg total) by mouth daily.   nitroGLYCERIN 0.4 MG SL tablet Commonly known as:  NITROSTAT Place 1 tablet (0.4 mg total) under the tongue every 5 (five) minutes as needed for chest pain.   polyethylene glycol powder powder Commonly known as:  GLYCOLAX/MIRALAX MIX 17GM IN LIQUID AND     DRINK TWO TIMES A DAY   PRESERVISION AREDS 2 Caps Take 1 capsule by mouth daily.   quinapril 40 MG tablet Commonly known as:  ACCUPRIL Take 0.5 tablets (20 mg total) by mouth daily.   SYSTANE 0.4-0.3 % Soln Generic drug:  Polyethyl Glycol-Propyl Glycol Place 2 drops into both eyes 2 (two) times daily.   UNABLE TO FIND Med Name: Med pass 120 mL 2 times a day between meals   Vitamin B-12 2500 MCG Subl Place 1 tablet under the tongue every morning.       Immunizations: Immunization History  Administered Date(s) Administered  . Influenza Split 07/11/2011, 07/21/2012, 07/10/2013  . Influenza Whole 08/24/2005, 08/05/2007, 06/24/2008, 07/13/2009, 06/26/2010  . Influenza, High Dose Seasonal PF 06/29/2013, 06/30/2014, 07/06/2015  . Influenza,inj,Quad PF,36+ Mos 06/07/2016  . PPD Test 06/10/2016  . Pneumococcal Conjugate-13 08/10/2014  . Pneumococcal Polysaccharide-23 09/24/2001  . Tdap 07/11/2011, 12/25/2015     Physical Exam:  Vitals:   06/13/16 1226  BP: 129/65  Pulse: 68  Resp: 20  Temp: 98.3 F (36.8 C)    TempSrc: Oral  SpO2: 96%  Weight: 104 lb 3.2 oz (47.3 kg)  Height: 4' 10.5" (1.486 m)   Body mass index is 21.41 kg/m.  General- elderly female, thin built, frail, in no acute distress Head- normocephalic, atraumatic Nose-  no nasal discharge Throat- moist mucus membrane, has dentures Eyes- no pallor, no icterus, no discharge, normal conjunctiva, normal sclera Neck- no cervical lymphadenopathy Cardiovascular- normal s1,s2, no murmur, trace right and 1+ left leg edema Respiratory- bilateral clear to auscultation, no wheeze, no rhonchi, no crackles, no use of accessory muscles Abdomen- bowel sounds present, soft, non tender, no guarding or rigidity, no CVA tenderness Musculoskeletal- able to move all 4 extremities, generalized weakness present, arthritis changes to her fingers, left elbow red and swollen and limited ROM with pain, left wrist swollen and tender and warm to touch, left middle finger PIP joint is red and swollen and tender to touch Neurological- alert and oriented to person, place and time  Skin- warm and dry Psychiatry- anxious    Labs reviewed: Basic Metabolic Panel:  Recent Labs  40/98/11 0455 06/08/16 0647 06/09/16 0522  NA 142 142 140  K 3.2* 3.8 4.4  CL 111 111 111  CO2 26 25 25   GLUCOSE 104* 95 103*  BUN 17 11 9   CREATININE 1.08* 1.01* 1.01*  CALCIUM 9.1 9.3 9.2   Liver Function Tests:  Recent Labs  02/16/16 1346 02/16/16 1620 06/05/16 2125  AST 17 19 21   ALT 10 11* 11*  ALKPHOS 130* 115 99  BILITOT 0.6 0.7 0.6  PROT 6.3 6.1* 5.9*  ALBUMIN 3.7 3.0* 3.2*    Recent Labs  07/21/15 1835 06/05/16 2125  LIPASE 58* 41   No results for input(s): AMMONIA in the last 8760 hours. CBC:  Recent Labs  02/07/16 1208 02/16/16 1346 02/16/16 1620 06/05/16 2125 06/07/16 0455  WBC 5.3 5.9 5.4 10.4 5.7  NEUTROABS 3.7 3.9 3.5  --   --   HGB 11.1* 11.4* 10.9* 12.4 10.7*  HCT 33.7* 35.2* 33.6* 39.3 34.0*  MCV 90.5 90.0 89.6 88.5 88.1  PLT 206.0  204.0 180 241 194   Cardiac Enzymes:  Recent Labs  12/24/15 2259  06/06/16 0355 06/06/16 0827 06/06/16 1334  CKTOTAL 67  --   --   --   --   TROPONINI 0.08*  < > <0.03 <0.03 <0.03  < > = values in this interval not displayed. BNP: Invalid input(s): POCBNP CBG:  Recent Labs  12/25/15 1106 06/09/16 0753 06/10/16 0820  GLUCAP 90 90 99    Radiological Exams: Ct Abdomen Pelvis Wo Contrast  Result Date: 06/06/2016 CLINICAL DATA:  Initial evaluation for acute abdominal pain with vomiting. Concern for possible small bowel obstruction. EXAM: CT ABDOMEN AND PELVIS WITHOUT CONTRAST TECHNIQUE: Multidetector CT imaging of the abdomen and pelvis was performed following the standard protocol without IV contrast. COMPARISON:  Prior CT from 07/21/2015. FINDINGS: Lower chest: Few scattered subcentimeter nodular densities within the right lower lobe measuring 5 mm are stable from prior, most likely benign. Scattered atelectatic changes present within the left lung base. Visualized lungs are otherwise clear. Prominent calcifications noted at the aortic valve. No pleural or pericardial effusion. Hepatobiliary: Limited noncontrast evaluation of the liver is grossly unremarkable. No evidence for cholelithiasis. No overt biliary dilatation. Pancreas: Pancreas grossly unremarkable without acute inflammatory changes. Spleen: Spleen within normal limits. Adrenals/Urinary Tract: Adrenal glands grossly unremarkable. Kidneys equal in measuring up to 8 mm, similar to prior, likely small proteinaceous and/ or hemorrhagic cysts. Peripherally calcified exophytic cyst at the upper pole of the right kidney measures 5.8 cm on today's exam. Bladder decompressed without acute abnormality. Stomach/Bowel: Stomach within normal limits. Multiple dilated loops of small bowel are seen within the upper and lower abdomen. These measure up to 4.4 cm in diameter and demonstrate associated internal air-fluid levels. The terminal ileum is  decompressed distally. Findings compatible with mechanical small bowel obstruction. Exact transition point is difficult to discern on this noncontrast examination. Note is made of a left inguinal hernia containing fluid and a loop of bowel. This is appears to be a portion of the colon, not a small bowel loop. Colon itself is of relatively normal caliber without acute abnormality. Additional fat containing ventral hernia noted more superiorly within the abdomen (series 2, image 24). Vascular/Lymphatic: Prominent aorto bi-iliac atherosclerotic disease identified. Intra-abdominal aorta dilated at 3.4 cm. Node definite pathologically enlarged lymph nodes identified on this noncontrast examination. Reproductive: Uterus and ovaries not well  seen, and may be absent. He should be it had he had Other: Moderate volume free fluid within the abdomen, likely reactive. No definite free intraperitoneal air. Musculoskeletal: Scoliosis with multilevel degenerative spondylolysis present. No acute osseous abnormality. No worrisome lytic or blastic osseous lesions. IMPRESSION: 1. Findings compatible with high-grade mechanical small bowel obstruction. Exact transition is difficult to discern on this examination, and not definitely identified. 2. Moderate volume free fluid within the abdomen, likely reactive. 3. Small fluid and bowel containing left inguinal hernia. This appears to contain a loop of colon, not small bowel, and is favored to not reflect the point of obstruction. 4. Stable 3.4 cm infrarenal abdominal aortic aneurysm. Recommend followup by ultrasound in 3 years. This recommendation follows ACR consensus guidelines: White Paper of the ACR Incidental Findings Committee II on Vascular Findings. [facility] [place] Coll Radiol 2013; 10:789-794 Electronically Signed   By: Rise Mu M.D.   On: 06/06/2016 00:44   Dg Abd 1 View  Result Date: 06/06/2016 CLINICAL DATA:  Nasogastric tube placement for small bowel obstruction. EXAM:  ABDOMEN - 1 VIEW COMPARISON:  CT 06/05/2016. Fluoroscopy Time:  1 minutes and 54 seconds Radiation Exposure Index (if provided by the fluoroscopic device): 7.2 mGy Number of Acquired Spot Images: 0 FINDINGS: Nasogastric tube placement was performed by the radiology technologist. A 14 French nasogastric tube was placed. 90 ml of Gastrografin was utilized to aid placement of the nasogastric tube. A single spot image after tube placement demonstrates the tip of the tube in the left upper quadrant of the abdomen. IMPRESSION: Nasogastric tube placement under fluoroscopy. Electronically Signed   By: Carey Bullocks M.D.   On: 06/06/2016 08:40   Dg Abd Portable 1v-small Bowel Obstruction Protocol-initial, 8 Hr Delay  Result Date: 06/06/2016 CLINICAL DATA:  Small bowel obstruction.  8 hour delayed film. EXAM: PORTABLE ABDOMEN - 1 VIEW COMPARISON:  CT scan from 06/05/2016 FINDINGS: Portable supine view the abdomen at 1743 hours shows diffuse gaseous distension of small bowel and colon. There is contrast material in the right colon, splenic flexure, descending colon and rectum. In CT tip overlies the mid stomach. IMPRESSION: Contrast material is visualized in the colon with diffuse gaseous distention of large and small bowel. Electronically Signed   By: Kennith Center M.D.   On: 06/06/2016 17:55   Dg Vangie Bicker G Tube Plc W/fl-no Rad  Result Date: 06/06/2016 CLINICAL DATA:  NASO G TUBE PLACEMENT WITH FLUORO Fluoroscopy was utilized by the requesting physician.  No radiographic interpretation.    Assessment/Plan   Physical deconditioning Will have her work with physical therapy and occupational therapy team to help with gait training and muscle strengthening exercises.fall precautions. Skin care. Encourage to be out of bed.   SBO Has been having her bowel movement. No nausea or vomiting. Monitor bowel movement.   Left elbow joint swelling Xray negative for fracture. Possible arthritis vs gout flare up. Change her  tylenol to 650 mg qid for now x 1 week. Concern for arthritis flare up with redness and swelling along with tenderness on exam. Check uric acid level to rule out gout with her on lasix. Start prednsione 40 mg daily x 5 days and reassess  Left wrist swelling See above.   ckd stage 3 Monitor bmp  Protein calorie malnutrition RD consult. Monitor po intake and weight.   Anemia of chronic disease Monitor cbc  Klebsiella UTI Continue and complete course of ceftin 500 mg bid until 06/15/16  Constipation With recent SBO. Continue miralax  bid  Anxiety Psychiatry referral for her anxiety. Start sertraline 25 mg daily and clonazepam 0.25 mg q8h prn for now  Leg edema Has PVD. Add ted hose. Continue lasix 20 mg daily. Monitor BP  HTN Monitor BP. Continue amlodipine 2.5 mg daily and imdur 30 mg daily, change administration time of her imdur to 6 am per pt request.  Stable angina Continue baby aspirin and prn NTG. Change administration time of imdur to 6 am    Goals of care: short term rehabilitation   Labs/tests ordered: cbc with diff, cmp, uric acid level 06/14/16  Family/ staff Communication: reviewed care plan with patient, her daughter and caregiver and nursing supervisor    Oneal Grout, MD Internal Medicine [facility] [address] [place], [place] [address] Cell Phone (Monday-Friday 8 am - 5 pm): [phone] On Call: [phone] and follow prompts after 5 pm and on weekends Office Phone: [phone] Office Fax: [phone]

## 2016-06-19 ENCOUNTER — Ambulatory Visit: Payer: Medicare Other | Admitting: Internal Medicine

## 2016-06-26 ENCOUNTER — Non-Acute Institutional Stay (SKILLED_NURSING_FACILITY): Payer: Medicare Other | Admitting: Family

## 2016-06-26 DIAGNOSIS — K219 Gastro-esophageal reflux disease without esophagitis: Secondary | ICD-10-CM

## 2016-06-26 DIAGNOSIS — M150 Primary generalized (osteo)arthritis: Secondary | ICD-10-CM

## 2016-06-26 DIAGNOSIS — I10 Essential (primary) hypertension: Secondary | ICD-10-CM | POA: Diagnosis not present

## 2016-06-26 DIAGNOSIS — R269 Unspecified abnormalities of gait and mobility: Secondary | ICD-10-CM

## 2016-06-26 DIAGNOSIS — M159 Polyosteoarthritis, unspecified: Secondary | ICD-10-CM

## 2016-06-26 DIAGNOSIS — E43 Unspecified severe protein-calorie malnutrition: Secondary | ICD-10-CM

## 2016-06-26 NOTE — Progress Notes (Signed)
Location:  [facility] Room Number: 907 P  Place of Service:  SNF [phone]  Provider: Richarda Bladeinah Yorel Redder FNP-C   PCP: [name],[name], MD Patient Care Team: Gordy SaversPeter F Kwiatkowski, MD as PCP - General  Extended Emergency Contact Information Primary Emergency Contact: Sharman CrateJones,Brenda  [place] Home Phone: [phone] Relation: Neighbor Secondary Emergency Contact: Brouillard,Gail Address: [address]          [place], [place] Home Phone: [phone] Work Phone: [phone] Mobile Phone: [phone] Relation: Daughter  Code Status: DNR Goals of care:  Advanced Directive information Advanced Directives 06/13/2016  Does patient have Erin Gibson advance directive? Yes  Type of Advance Directive Out of facility DNR (pink MOST or yellow form)  Does patient want to make changes to advanced directive? No - Patient declined  Copy of advanced directive(s) in chart? Yes  Would patient like information on creating Erin Gibson advanced directive? -     Allergies  Allergen Reactions  . Tramadol Other (See Comments)    Auditory and visual hallucinations  . Codeine Nausea And Vomiting    Violently ill  . Ibuprofen Nausea And Vomiting  . Diltiazem Hcl Other (See Comments)    Not sure about this reaction    Chief Complaint  Patient presents with  . Erin Note    HPI:  82 y.o. female [facility] for Erin Gibson,anemia, GERD, DVT,CKD stage 3,recurrent SBO,DJD, multiple abdominal surgeries among  others. She is seen in her room today. She denies any acute issues this visit. She has worked well with PT/OT now stable for Erin home. She will be Erin home with PT/OT to continue with ROM, exercise, gait stability and muscle strengthening. She does not require any DME states has own . Prescription medication will be written X 1 month then patient to follow up with PCP. Home health service will be arranged by facility social worker prior to Erin home. Facility Nurse reports no new concerns.     Past Medical History:  Diagnosis Date  . ANEMIA 03/31/2009  . Chronic kidney disease   . DEGENERATIVE JOINT DISEASE, GENERALIZED 05/06/2007  . Diverticulosis of sigmoid colon    Colonoscopy 2011  . DVT (deep venous thrombosis) (HCC)    hx of  . GERD 04/24/2007  . HYPERTENSION 04/24/2007  . VENOUS STASIS ULCER 07/20/2008    Past Surgical History:  Procedure Laterality Date  . ABDOMINAL HYSTERECTOMY     Completion hysterectomy  . CATARACT EXTRACTION    . CHOLECYSTECTOMY OPEN    . HERNIA REPAIR    . PARTIAL HYSTERECTOMY     Salpingo-oophorectomy as well  . TOTAL KNEE ARTHROPLASTY     x2      reports that she has never smoked. She has never used smokeless tobacco. She reports that she does not drink alcohol or use drugs. Social History   Social History  . Marital status: Widowed    Spouse name: N/A  . Number of children: N/A  . Years of education: N/A   Occupational History  .  Not on file.   Social History Main Topics  . Smoking status: Never Smoker  . Smokeless tobacco: Never Used  . Alcohol use No  . Drug use: No  . Sexual activity: Not on file   Other Topics Concern  . Not on file   Social History Narrative  . No narrative on file    Allergies  Allergen Reactions  . Tramadol Other (See Comments)    Auditory and visual hallucinations  . Codeine Nausea And Vomiting    Violently ill  . Ibuprofen Nausea And Vomiting  . Diltiazem Hcl Other (See Comments)     Not sure about this reaction    Pertinent  Health Maintenance Due  Topic Date Due  . DEXA SCAN  10/02/1985  . INFLUENZA VACCINE  Completed  . PNA vac Low Risk Adult  Completed    Medications:   Medication List       Accurate as of 06/26/16  4:28 PM. Always use your most recent med list.          acetaminophen 325 MG tablet Commonly known as:  TYLENOL Take 2 tablets (650 mg total) by mouth every 4 (four) hours as needed for mild pain or moderate pain.   amLODipine 2.5 MG tablet Commonly known as:  NORVASC Take 1 tablet (2.5 mg total) by mouth daily.   aspirin EC 81 MG tablet Take 81 mg by mouth every morning.   clonazePAM 0.5 MG tablet Commonly known as:  KLONOPIN Take 0.25 mg by mouth 3 (three) times daily as needed for anxiety.   furosemide 40 MG tablet Commonly known as:  LASIX TAKE 1/2 TABLET (=20MG )    DAILY   isosorbide mononitrate 30 MG 24 hr tablet Commonly known as:  IMDUR Take 1 tablet (30 mg total) by mouth daily.   lisinopril 20 MG tablet Commonly known as:  PRINIVIL,ZESTRIL Take 20 mg by mouth daily.   Melatonin 3 MG Tabs Take 3 mg by mouth at bedtime.   nitroGLYCERIN 0.4 MG SL tablet Commonly known as:  NITROSTAT Place 1 tablet (0.4 mg total) under the tongue every 5 (five) minutes as needed for chest pain.   polyethylene glycol powder powder Commonly known as:  GLYCOLAX/MIRALAX MIX 17GM IN LIQUID AND     DRINK TWO TIMES A DAY   PRESERVISION AREDS 2 Caps Take 1 capsule by mouth daily.   sertraline 25 MG tablet Commonly known as:  ZOLOFT Take 25 mg by mouth daily.   SYSTANE 0.4-0.3 % Soln Generic drug:  Polyethyl Glycol-Propyl Glycol Place 2 drops into both eyes 2 (two) times daily.   UNABLE TO FIND Med Name: Med pass 120 mL 2 times a day between meals   Vitamin B-12 2500 MCG Subl Place 1 tablet under the tongue every morning.       Review of Systems  Constitutional: Negative for activity change, appetite change, chills,  fatigue and fever.  HENT: Negative for congestion, rhinorrhea, sinus pain, sinus pressure, sneezing and sore throat.   Eyes: Negative.   Respiratory: Negative for cough, chest tightness, shortness of breath and wheezing.   Cardiovascular: Negative for chest pain, palpitations and leg swelling.  Gastrointestinal: Negative for abdominal distention, abdominal pain, constipation, diarrhea, nausea and vomiting.  Endocrine: Negative.   Genitourinary: Negative for dysuria, flank pain, frequency and urgency.  Musculoskeletal: Positive for gait problem.  Skin: Negative for color change, pallor and rash.  Neurological: Negative for dizziness, seizures, syncope and headaches.  Hematological: Does not bruise/bleed easily.  Psychiatric/Behavioral: Negative for agitation, confusion, hallucinations and sleep disturbance. The patient is not nervous/anxious.     Vitals:   06/26/16 1045  BP: (!) 123/55  Pulse: (!) 55  Resp: 18  Temp: 98.3 F (36.8 C)  SpO2: 97%  Weight: 106 lb (48.1 kg)  Height: 4' 10.5" (1.486 m)   Body mass index is 21.78 kg/m. Physical Exam  Constitutional:  Thin frail elderly in no acute distress.   HENT:  Head: Normocephalic.  Mouth/Throat: Oropharynx is clear and moist. No oropharyngeal exudate.  Eyes: Conjunctivae and EOM are normal. Pupils are equal, round, and reactive to light. Right eye exhibits no Erin. Left eye exhibits no Erin. No scleral icterus.  Neck: Normal range of motion. No JVD present. No thyromegaly present.  Cardiovascular: Intact distal pulses.  Exam reveals no gallop and no friction rub.   Murmur heard. Pulmonary/Chest: Effort normal and breath sounds normal. No respiratory distress. She has no wheezes. She has no rales.  Abdominal: Soft. Bowel sounds are normal. She exhibits no distension. There is no tenderness. There is no rebound and no guarding.  Musculoskeletal: She exhibits no tenderness or deformity.  Moves x 4 extremities. Unsteady  gait.Bilateral lower extremities trace edema.    Lymphadenopathy:    She has no cervical adenopathy.  Neurological: She is alert.  Skin: Skin is warm and dry. No rash noted. No erythema. No pallor.  Psychiatric: She has a normal mood and affect.    Labs reviewed: Basic Metabolic Panel:  Recent Labs  40/98/11 0455 06/08/16 0647 06/09/16 0522  NA 142 142 140  K 3.2* 3.8 4.4  CL 111 111 111  CO2 26 25 25   GLUCOSE 104* 95 103*  BUN 17 11 9   CREATININE 1.08* 1.01* 1.01*  CALCIUM 9.1 9.3 9.2   Liver Function Tests:  Recent Labs  02/16/16 1346 02/16/16 1620 06/05/16 2125  AST 17 19 21   ALT 10 11* 11*  ALKPHOS 130* 115 99  BILITOT 0.6 0.7 0.6  PROT 6.3 6.1* 5.9*  ALBUMIN 3.7 3.0* 3.2*    Recent Labs  07/21/15 1835 06/05/16 2125  LIPASE 58* 41   CBC:  Recent Labs  02/07/16 1208 02/16/16 1346 02/16/16 1620 06/05/16 2125 06/07/16 0455  WBC 5.3 5.9 5.4 10.4 5.7  NEUTROABS 3.7 3.9 3.5  --   --   HGB 11.1* 11.4* 10.9* 12.4 10.7*  HCT 33.7* 35.2* 33.6* 39.3 34.0*  MCV 90.5 90.0 89.6 88.5 88.1  PLT 206.0 204.0 180 241 194   Cardiac Enzymes:  Recent Labs  12/24/15 2259  06/06/16 0355 06/06/16 0827 06/06/16 1334  CKTOTAL 67  --   --   --   --   TROPONINI 0.08*  < > <0.03 <0.03 <0.03  < > = values in this interval not displayed.  CBG:  Recent Labs  12/25/15 1106 06/09/16 0753 06/10/16 0820  GLUCAP 90 90 99   Assessment/Plan:   1. Essential hypertension Stable.continue to monitor. BMP in 1-2 weeks with PCP.    2. Gastroesophageal reflux disease without esophagitis Asymptomatic.   3. Primary osteoarthritis involving multiple joints Arthritic changes to fingers. Continue current pain regimen.   4. Protein-calorie malnutrition, severe Continue on protein supplements. BMP in 1-2 weeks with PCP.   5. Abnormality of gait Has improved with therapy. Will Erin home with PT/OT to continue with ROM, exercise, gait stability and muscle  strengthening.     Patient is being discharged with the following home health services:    -  PT/OT to continue with ROM, exercise, gait stability and muscle strengthening.  Patient is being discharged with the following durable medical equipment:  - None required has own at home.     Patient has been advised to f/u with their PCP in 1-2 weeks to for a transitions of care visit.  Social services at their facility was responsible for arranging this appointment.  Pt was provided with adequate prescriptions of noncontrolled medications to reach the scheduled appointment .  For controlled substances, a limited supply was provided as appropriate for the individual patient.  If the pt normally receives these medications from a pain clinic or has a contract with another physician, these medications should be received from that clinic or physician only).    Future labs/tests needed: CBC, BMP in 1-2 weeks with PCP.

## 2016-07-09 ENCOUNTER — Encounter: Payer: Self-pay | Admitting: Internal Medicine

## 2016-07-09 ENCOUNTER — Ambulatory Visit (INDEPENDENT_AMBULATORY_CARE_PROVIDER_SITE_OTHER): Payer: Medicare Other | Admitting: Internal Medicine

## 2016-07-09 VITALS — BP 122/52 | HR 66 | Temp 98.5°F | Ht 58.25 in | Wt 108.0 lb

## 2016-07-09 DIAGNOSIS — I350 Nonrheumatic aortic (valve) stenosis: Secondary | ICD-10-CM

## 2016-07-09 DIAGNOSIS — K56609 Unspecified intestinal obstruction, unspecified as to partial versus complete obstruction: Secondary | ICD-10-CM

## 2016-07-09 DIAGNOSIS — I10 Essential (primary) hypertension: Secondary | ICD-10-CM

## 2016-07-09 DIAGNOSIS — I872 Venous insufficiency (chronic) (peripheral): Secondary | ICD-10-CM | POA: Diagnosis not present

## 2016-07-09 NOTE — Progress Notes (Signed)
Pre visit review using our clinic review tool, if applicable. No additional management support is needed unless otherwise documented below in the visit note. 

## 2016-07-09 NOTE — Patient Instructions (Signed)
Limit your sodium (Salt) intake  Acute bronchitis symptoms for less than 10 days are generally not helped by antibiotics.  Take over-the-counter expectorants and cough medications such as  Mucinex DM.  Call if there is no improvement in 5 to 7 days or if  you develop worsening cough, fever, or new symptoms, such as shortness of breath or chest pain.  Return in 3-4 months for follow-up

## 2016-07-09 NOTE — Progress Notes (Signed)
Subjective:    Patient ID: Erin Gibson, female    DOB: 26-Jan-1919, 82 y.o.   MRN: [id number]  HPI  82 year old patient who is seen today for follow-up. For the past week she has had a nonproductive cough.  This seems to be improving. She was hospitalized last month for a partial small bowel obstruction that was treated medically.  Denies any recurrent nausea or vomiting or abdominal distention She has essential hypertension as well as chronic venous insufficiency.  Her lower extremity edema has been reasonably well-controlled. Hospital course was cochlear by cellulitis involving left arm that has resolved.  She spent some time at a rehabilitation facility, but presently is back at home. She is accompanied by a  LPN  Past Medical History:  Diagnosis Date  . ANEMIA 03/31/2009  . Chronic kidney disease   . DEGENERATIVE JOINT DISEASE, GENERALIZED 05/06/2007  . Diverticulosis of sigmoid colon    Colonoscopy 2011  . DVT (deep venous thrombosis) (HCC)    hx of  . GERD 04/24/2007  . HYPERTENSION 04/24/2007  . VENOUS STASIS ULCER 07/20/2008     Social History   Social History  . Marital status: Widowed    Spouse name: N/A  . Number of children: N/A  . Years of education: N/A   Occupational History  . Not on file.   Social History Main Topics  . Smoking status: Never Smoker  . Smokeless tobacco: Never Used  . Alcohol use No  . Drug use: No  . Sexual activity: Not on file   Other Topics Concern  . Not on file   Social History Narrative  . No narrative on file    Past Surgical History:  Procedure Laterality Date  . ABDOMINAL HYSTERECTOMY     Completion hysterectomy  . CATARACT EXTRACTION    . CHOLECYSTECTOMY OPEN    . HERNIA REPAIR    . PARTIAL HYSTERECTOMY     Salpingo-oophorectomy as well  . TOTAL KNEE ARTHROPLASTY     x2    No family history on file.  Allergies  Allergen Reactions  . Tramadol Other (See Comments)    Auditory and visual hallucinations  .  Codeine Nausea And Vomiting    Violently ill  . Ibuprofen Nausea And Vomiting  . Diltiazem Hcl Other (See Comments)    Not sure about this reaction    Current Outpatient Prescriptions on File Prior to Visit  Medication Sig Dispense Refill  . acetaminophen (TYLENOL) 325 MG tablet Take 2 tablets (650 mg total) by mouth every 4 (four) hours as needed for mild pain or moderate pain. 30 tablet 0  . amLODipine (NORVASC) 2.5 MG tablet Take 1 tablet (2.5 mg total) by mouth daily. 90 tablet 3  . aspirin EC 81 MG tablet Take 81 mg by mouth every morning.    . clonazePAM (KLONOPIN) 0.5 MG tablet Take 0.25 mg by mouth 3 (three) times daily as needed for anxiety.    . Cyanocobalamin (VITAMIN B-12) 2500 MCG SUBL Place 1 tablet under the tongue every morning.    . furosemide (LASIX) 40 MG tablet TAKE 1/2 TABLET (=20MG )    DAILY 45 tablet 3  . isosorbide mononitrate (IMDUR) 30 MG 24 hr tablet Take 1 tablet (30 mg total) by mouth daily. 30 tablet 4  . lisinopril (PRINIVIL,ZESTRIL) 20 MG tablet Take 20 mg by mouth daily.    . Melatonin 3 MG TABS Take 3 mg by mouth at bedtime.    . Multiple Vitamins-Minerals (PRESERVISION  AREDS 2) CAPS Take 1 capsule by mouth daily. 90 capsule 3  . nitroGLYCERIN (NITROSTAT) 0.4 MG SL tablet Place 1 tablet (0.4 mg total) under the tongue every 5 (five) minutes as needed for chest pain. 25 tablet 2  . Polyethyl Glycol-Propyl Glycol (SYSTANE) 0.4-0.3 % SOLN Place 2 drops into both eyes 2 (two) times daily.    . polyethylene glycol powder (GLYCOLAX/MIRALAX) powder MIX 17GM IN LIQUID AND     DRINK TWO TIMES A DAY 3162 g 3  . sertraline (ZOLOFT) 25 MG tablet Take 25 mg by mouth daily.    Marland Kitchen. UNABLE TO FIND Med Name: Med pass 120 mL 2 times a day between meals     No current facility-administered medications on file prior to visit.     BP (!) 122/52 (BP Location: Left Arm, Patient Position: Sitting, Cuff Size: Normal)   Pulse 66   Temp 98.5 F (36.9 C) (Oral)   Ht 4' 10.25"  (1.48 m)   Wt 108 lb (49 kg)   SpO2 96%   BMI 22.38 kg/m     Review of Systems  Constitutional: Positive for fatigue.  HENT: Negative for congestion, dental problem, hearing loss, rhinorrhea, sinus pressure, sore throat and tinnitus.   Eyes: Negative for pain, discharge and visual disturbance.  Respiratory: Positive for cough. Negative for shortness of breath and wheezing.   Cardiovascular: Negative for chest pain, palpitations and leg swelling.  Gastrointestinal: Negative for abdominal distention, abdominal pain, blood in stool, constipation, diarrhea, nausea and vomiting.  Genitourinary: Negative for difficulty urinating, dysuria, flank pain, frequency, hematuria, pelvic pain, urgency, vaginal bleeding, vaginal discharge and vaginal pain.  Musculoskeletal: Negative for arthralgias, gait problem and joint swelling.  Skin: Negative for rash.  Neurological: Negative for dizziness, syncope, speech difficulty, weakness, numbness and headaches.  Hematological: Negative for adenopathy.  Psychiatric/Behavioral: Negative for agitation, behavioral problems and dysphoric mood. The patient is not nervous/anxious.        Objective:   Physical Exam  Constitutional: She is oriented to person, place, and time. She appears well-developed and well-nourished.  Blood pressure 140/60 Pulse 66, O2 saturation 96%   HENT:  Head: Normocephalic.  Right Ear: External ear normal.  Left Ear: External ear normal.  Mouth/Throat: Oropharynx is clear and moist.  Eyes: Conjunctivae and EOM are normal. Pupils are equal, round, and reactive to light.  Neck: Normal range of motion. Neck supple. No thyromegaly present.  Cardiovascular: Normal rate, regular rhythm and intact distal pulses.   Murmur heard. Pulmonary/Chest: Effort normal and breath sounds normal.  Chest is clear  Abdominal: Soft. Bowel sounds are normal. She exhibits no mass. There is no tenderness.  Musculoskeletal: Normal range of motion. She  exhibits edema.  Lymphadenopathy:    She has no cervical adenopathy.  Neurological: She is alert and oriented to person, place, and time.  Skin: Skin is warm and dry. No rash noted.  Psychiatric: She has a normal mood and affect. Her behavior is normal.          Assessment & Plan:   , viral URI with cough .  History of small bowel obstruction treated medically   essential hypertension, stable.    Chronic venous insufficiency .  Reasonable control .  Aortic stenosis, stable  .  Will treat symptomatically with Mucinex DM.  She will report any clinical worsening, such as fever or shortness of breath  .  Return in 6 months for follow-up  [name],Zacchary Pompei FRANK

## 2016-08-02 ENCOUNTER — Telehealth: Payer: Self-pay | Admitting: Internal Medicine

## 2016-08-02 MED ORDER — LISINOPRIL 20 MG PO TABS: 20.0000 mg | ORAL_TABLET | Freq: Every day | ORAL | 1 refills | Status: DC

## 2016-08-02 MED ORDER — SERTRALINE HCL 25 MG PO TABS: 25.0000 mg | ORAL_TABLET | Freq: Every day | ORAL | 1 refills | Status: DC

## 2016-08-02 NOTE — Telephone Encounter (Signed)
Yes.  Patient is supposed to take both sertraline and lisinopril

## 2016-08-02 NOTE — Telephone Encounter (Signed)
Dr.K, please advise if pt is suppose to be taking Lisinopril 20 mg daily  and Sertraline 25 mg daily you did not order them a Dr. Richarda Bladeinah Ngetich, NP did from [name].

## 2016-08-02 NOTE — Telephone Encounter (Signed)
Pt is calling to ask should she still be taking sertraline and lisinopril . [facility] [address]

## 2016-08-02 NOTE — Telephone Encounter (Signed)
Spoke to pt, told her yes per Dr.K you are suppose to be taking Lisinopril and Sertraline. I will send new Rx's to the pharmacy. Pt verbalized understanding.

## 2016-08-09 ENCOUNTER — Ambulatory Visit (INDEPENDENT_AMBULATORY_CARE_PROVIDER_SITE_OTHER): Payer: Medicare Other | Admitting: Family Medicine

## 2016-08-09 ENCOUNTER — Encounter: Payer: Self-pay | Admitting: Family Medicine

## 2016-08-09 ENCOUNTER — Ambulatory Visit (INDEPENDENT_AMBULATORY_CARE_PROVIDER_SITE_OTHER): Payer: Medicare Other

## 2016-08-09 VITALS — BP 154/88 | HR 57

## 2016-08-09 VITALS — BP 160/80 | HR 58 | Ht <= 58 in | Wt 113.3 lb

## 2016-08-09 DIAGNOSIS — N3090 Cystitis, unspecified without hematuria: Secondary | ICD-10-CM | POA: Diagnosis not present

## 2016-08-09 DIAGNOSIS — Z0000 Encounter for general adult medical examination without abnormal findings: Secondary | ICD-10-CM | POA: Diagnosis not present

## 2016-08-09 DIAGNOSIS — I208 Other forms of angina pectoris: Secondary | ICD-10-CM | POA: Diagnosis not present

## 2016-08-09 LAB — POC URINALSYSI DIPSTICK (AUTOMATED)
Bilirubin, UA: NEGATIVE
Glucose, UA: NEGATIVE
KETONES UA: NEGATIVE
Nitrite, UA: POSITIVE
PROTEIN UA: NEGATIVE
Spec Grav, UA: 1.015
Urobilinogen, UA: 0.2
pH, UA: 5.5

## 2016-08-09 MED ORDER — CEFDINIR 300 MG PO CAPS: 300.0000 mg | ORAL_CAPSULE | Freq: Every day | ORAL | 0 refills | Status: DC

## 2016-08-09 NOTE — Progress Notes (Signed)
Subjective:    Patient ID: Erin Gibson, female    DOB: 11/25/1918, 82 y.o.   MRN: [id number]  HPI  Erin Gibson is a 82 year old female who presents today with urinary frequency and burning for greater than one week.  Associated symptoms of urinary urgency is noted. She denies fever, chills, sweats, N/V/D, hematuria, and flank pain. Cystitis on 06/05/2016 when she was hospitalized and upon discharge was provided Ceftin BID for 5 days.  She believes she took this medication as she was transferred to a SNF for rehab post hospitalization but cannot remember the name of all of her medications. Daughter is present and does not know exactly what medications she takes on a daily basis. No treatments have been tried at home for symptoms at this time.   Review of Systems  Constitutional: Negative for chills, fatigue and fever.  Respiratory: Negative for cough, shortness of breath and wheezing.   Cardiovascular: Negative for chest pain and palpitations.  Gastrointestinal: Negative for abdominal pain, diarrhea, nausea and vomiting.  Genitourinary: Positive for dysuria and frequency. Negative for hematuria.  Musculoskeletal: Negative for myalgias.  Neurological: Negative for dizziness, weakness and headaches.   Past Medical History:  Diagnosis Date  . ANEMIA 03/31/2009  . Chronic kidney disease   . DEGENERATIVE JOINT DISEASE, GENERALIZED 05/06/2007  . Diverticulosis of sigmoid colon    Colonoscopy 2011  . DVT (deep venous thrombosis) (HCC)    hx of  . GERD 04/24/2007  . HYPERTENSION 04/24/2007  . VENOUS STASIS ULCER 07/20/2008     Social History   Social History  . Marital status: Widowed    Spouse name: N/A  . Number of children: N/A  . Years of education: N/A   Occupational History  . Not on file.   Social History Main Topics  . Smoking status: Never Smoker  . Smokeless tobacco: Never Used  . Alcohol use No  . Drug use: No  . Sexual activity: Not on file   Other Topics Concern  .  Not on file   Social History Narrative  . No narrative on file    Past Surgical History:  Procedure Laterality Date  . ABDOMINAL HYSTERECTOMY     Completion hysterectomy  . CATARACT EXTRACTION    . CHOLECYSTECTOMY OPEN    . HERNIA REPAIR    . PARTIAL HYSTERECTOMY     Salpingo-oophorectomy as well  . TOTAL KNEE ARTHROPLASTY     x2    No family history on file.  Allergies  Allergen Reactions  . Tramadol Other (See Comments)    Auditory and visual hallucinations  . Codeine Nausea And Vomiting    Violently ill  . Ibuprofen Nausea And Vomiting  . Diltiazem Hcl Other (See Comments)    Not sure about this reaction    Current Outpatient Prescriptions on File Prior to Visit  Medication Sig Dispense Refill  . acetaminophen (TYLENOL) 325 MG tablet Take 2 tablets (650 mg total) by mouth every 4 (four) hours as needed for mild pain or moderate pain. 30 tablet 0  . amLODipine (NORVASC) 2.5 MG tablet Take 1 tablet (2.5 mg total) by mouth daily. 90 tablet 3  . aspirin EC 81 MG tablet Take 81 mg by mouth every morning.    . clonazePAM (KLONOPIN) 0.5 MG tablet Take 0.25 mg by mouth 3 (three) times daily as needed for anxiety.    . Cyanocobalamin (VITAMIN B-12) 2500 MCG SUBL Place 1 tablet under the tongue every morning.    .Marland Kitchen  furosemide (LASIX) 40 MG tablet TAKE 1/2 TABLET (=20MG )    DAILY 45 tablet 3  . isosorbide mononitrate (IMDUR) 30 MG 24 hr tablet Take 1 tablet (30 mg total) by mouth daily. 30 tablet 4  . lisinopril (PRINIVIL,ZESTRIL) 20 MG tablet Take 1 tablet (20 mg total) by mouth daily. 90 tablet 1  . Melatonin 3 MG TABS Take 3 mg by mouth at bedtime.    . Multiple Vitamins-Minerals (PRESERVISION AREDS 2) CAPS Take 1 capsule by mouth daily. 90 capsule 3  . nitroGLYCERIN (NITROSTAT) 0.4 MG SL tablet Place 1 tablet (0.4 mg total) under the tongue every 5 (five) minutes as needed for chest pain. 25 tablet 2  . Polyethyl Glycol-Propyl Glycol (SYSTANE) 0.4-0.3 % SOLN Place 2 drops into  both eyes 2 (two) times daily.    . polyethylene glycol powder (GLYCOLAX/MIRALAX) powder MIX 17GM IN LIQUID AND     DRINK TWO TIMES A DAY 3162 g 3  . sertraline (ZOLOFT) 25 MG tablet Take 1 tablet (25 mg total) by mouth daily. 90 tablet 1  . UNABLE TO FIND Med Name: Med pass 120 mL 2 times a day between meals     No current facility-administered medications on file prior to visit.     BP (!) 154/88   Pulse (!) 57   SpO2 98%       Objective:   Physical Exam  Constitutional: She is oriented to person, place, and time.  Thin, elderly female who appears optimally nourished  Cardiovascular: Normal rate and regular rhythm.   Pulmonary/Chest: Effort normal and breath sounds normal. She has no wheezes. She has no rales.  Abdominal: Soft. Bowel sounds are normal. There is no tenderness. There is no CVA tenderness.  Mild suprapubic tenderness present  Lymphadenopathy:    She has no cervical adenopathy.  Neurological: She is alert and oriented to person, place, and time. Coordination normal.  Skin: Skin is warm and dry. No rash noted.        Assessment & Plan:  1. Cystitis UA + trace of leukocytes, + nitrites, 1+ blood. Will obtain culture. CrCl calculated  19.63 and renal dosing implemented. Advised patient to increase water and drink enough to keep her urine pale yellow or clear. Advised her to follow up with her PCP if symptoms do not improve, worsen, or she develops fever or new symptoms. Also discussed with daughter and patient that urinary tract infections can cause confusion in older adults. Patient has caregivers who will monitor for any changes.  We reviewed signs to monitor for further evaluation of chills, fever, flank pain, or blood in her urine.  - cefdinir (OMNICEF) 300 MG capsule; Take 1 capsule (300 mg total) by mouth daily.  Dispense: 7 capsule; Refill: 0  Roddie McJulia Fartun Paradiso, FNP-C

## 2016-08-09 NOTE — Patient Instructions (Addendum)
Erin Gibson , Thank you for taking time to come for your Medicare Wellness Visit. I appreciate your ongoing commitment to your health goals. Please review the following plan we discussed and let me know if I can assist you in the future.   Postponed your dexa for now  Will check out some agencies to visit during the week  Will see Almyra Free today for c/o of possible UTI  Will see Almyra Free about high BP Up to 160/ 180 over 80; but eating high sodium food   [facility]  Address: [address] [place], [place] [address]  Hours:  Open today  8AM-8PM Phone: [phone]    These are the goals we discussed: Goals    . patient          Diabetes and weight loss; [facility]  Phone: [phone]   [facility]; [phone] [address] [place]; [phone] Get resource to get information on any and all community programs for Seniors  [place]: [phone] Community Health Response Program -[phone] Public Health Dept; Need to be a skilled visit but can assist with bathing as well; [phone]  Adult center for Enrichment;  Call [facility]; [phone]  Adult day services include Adult Day Care, Adult Day Healthcare, Group Respite, [facility], [facility] [employer], Education and Support Program     Dept of Social Services; Call [phone] and ask for SW on call  Options for Medicaid include the Community Alternatives program; [facility] (personal care services) or PACE program, which is a medical and social program combined  Braulio Conte manages the community Alternatives program at the [employer]; [phone] (this is a program with a waiting list but provides SNF care at home;  [facility] required; Call Claiborne Billings and she will send out packet of information       De[facility] & [facility] of Hearing Division Services - can assist with hearing aid x 1  No reviews  State Government        This is a list of the screening recommended  for you and due dates:  Health Maintenance  Topic Date Due  . DEXA scan (bone density measurement)  10/02/1985  . Shingles Vaccine  07/09/2017*  . Tetanus Vaccine  12/24/2025  . Flu Shot  Completed  . Pneumonia vaccines  Completed  *Topic was postponed. The date shown is not the original due date.       ----     Fall Prevention in the Home Introduction Falls can cause injuries. They can happen to people of all ages. There are many things you can do to make your home safe and to help prevent falls. What can I do on the outside of my home?  Regularly fix the edges of walkways and driveways and fix any cracks.  Remove anything that might make you trip as you walk through a door, such as a raised step or threshold.  Trim any bushes or trees on the path to your home.  Use bright outdoor lighting.  Clear any walking paths of anything that might make someone trip, such as rocks or tools.  Regularly check to see if handrails are loose or broken. Make sure that both sides of any steps have handrails.  Any raised decks and porches should have guardrails on the edges.  Have any leaves, snow, or ice cleared regularly.  Use sand or salt on walking paths during winter.  Clean up any spills in your garage right away.  This includes oil or grease spills. What can I do in the bathroom?  Use night lights.  Install grab bars by the toilet and in the tub and shower. Do not use towel bars as grab bars.  Use non-skid mats or decals in the tub or shower.  If you need to sit down in the shower, use a plastic, non-slip stool.  Keep the floor dry. Clean up any water that spills on the floor as soon as it happens.  Remove soap buildup in the tub or shower regularly.  Attach bath mats securely with double-sided non-slip rug tape.  Do not have throw rugs and other things on the floor that can make you trip. What can I do in the bedroom?  Use night lights.  Make sure that you have a  light by your bed that is easy to reach.  Do not use any sheets or blankets that are too big for your bed. They should not hang down onto the floor.  Have a firm chair that has side arms. You can use this for support while you get dressed.  Do not have throw rugs and other things on the floor that can make you trip. What can I do in the kitchen?  Clean up any spills right away.  Avoid walking on wet floors.  Keep items that you use a lot in easy-to-reach places.  If you need to reach something above you, use a strong step stool that has a grab bar.  Keep electrical cords out of the way.  Do not use floor polish or wax that makes floors slippery. If you must use wax, use non-skid floor wax.  Do not have throw rugs and other things on the floor that can make you trip. What can I do with my stairs?  Do not leave any items on the stairs.  Make sure that there are handrails on both sides of the stairs and use them. Fix handrails that are broken or loose. Make sure that handrails are as long as the stairways.  Check any carpeting to make sure that it is firmly attached to the stairs. Fix any carpet that is loose or worn.  Avoid having throw rugs at the top or bottom of the stairs. If you do have throw rugs, attach them to the floor with carpet tape.  Make sure that you have a light switch at the top of the stairs and the bottom of the stairs. If you do not have them, ask someone to add them for you. What else can I do to help prevent falls?  Wear shoes that:  Do not have high heels.  Have rubber bottoms.  Are comfortable and fit you well.  Are closed at the toe. Do not wear sandals.  If you use a stepladder:  Make sure that it is fully opened. Do not climb a closed stepladder.  Make sure that both sides of the stepladder are locked into place.  Ask someone to hold it for you, if possible.  Clearly mark and make sure that you can see:  Any grab bars or  handrails.  First and last steps.  Where the edge of each step is.  Use tools that help you move around (mobility aids) if they are needed. These include:  Canes.  Walkers.  Scooters.  Crutches.  Turn on the lights when you go into a dark area. Replace any light bulbs as soon as they burn out.  Set up your furniture  so you have a clear path. Avoid moving your furniture around.  If any of your floors are uneven, fix them.  If there are any pets around you, be aware of where they are.  Review your medicines with your doctor. Some medicines can make you feel dizzy. This can increase your chance of falling. Ask your doctor what other things that you can do to help prevent falls. This information is not intended to replace advice given to you by your health care provider. Make sure you discuss any questions you have with your health care provider. Document Released: 07/07/2009 Document Revised: 02/16/2016 Document Reviewed: 10/15/2014  2017 Elsevier  Health Maintenance, Female Introduction Adopting a healthy lifestyle and getting preventive care can go a long way to promote health and wellness. Talk with your health care provider about what schedule of regular examinations is right for you. This is a good chance for you to check in with your provider about disease prevention and staying healthy. In between checkups, there are plenty of things you can do on your own. Experts have done a lot of research about which lifestyle changes and preventive measures are most likely to keep you healthy. Ask your health care provider for more information. Weight and diet Eat a healthy diet  Be sure to include plenty of vegetables, fruits, low-fat dairy products, and lean protein.  Do not eat a lot of foods high in solid fats, added sugars, or salt.  Get regular exercise. This is one of the most important things you can do for your health.  Most adults should exercise for at least 150 minutes  each week. The exercise should increase your heart rate and make you sweat (moderate-intensity exercise).  Most adults should also do strengthening exercises at least twice a week. This is in addition to the moderate-intensity exercise. Maintain a healthy weight  Body mass index (BMI) is a measurement that can be used to identify possible weight problems. It estimates body fat based on height and weight. Your health care provider can help determine your BMI and help you achieve or maintain a healthy weight.  For females 77 years of age and older:  A BMI below 18.5 is considered underweight.  A BMI of 18.5 to 24.9 is normal.  A BMI of 25 to 29.9 is considered overweight.  A BMI of 30 and above is considered obese. Watch levels of cholesterol and blood lipids  You should start having your blood tested for lipids and cholesterol at 80 years of age, then have this test every 5 years.  You may need to have your cholesterol levels checked more often if:  Your lipid or cholesterol levels are high.  You are older than 80 years of age.  You are at high risk for heart disease. Cancer screening Lung Cancer  Lung cancer screening is recommended for adults 34-75 years old who are at high risk for lung cancer because of a history of smoking.  A yearly low-dose CT scan of the lungs is recommended for people who:  Currently smoke.  Have quit within the past 15 years.  Have at least a 30-pack-year history of smoking. A pack year is smoking an average of one pack of cigarettes a day for 1 year.  Yearly screening should continue until it has been 15 years since you quit.  Yearly screening should stop if you develop a health problem that would prevent you from having lung cancer treatment. Breast Cancer  Practice breast self-awareness. This  means understanding how your breasts normally appear and feel.  It also means doing regular breast self-exams. Let your health care provider know about  any changes, no matter how small.  If you are in your 20s or 30s, you should have a clinical breast exam (CBE) by a health care provider every 1-3 years as part of a regular health exam.  If you are 42 or older, have a CBE every year. Also consider having a breast X-ray (mammogram) every year.  If you have a family history of breast cancer, talk to your health care provider about genetic screening.  If you are at high risk for breast cancer, talk to your health care provider about having an MRI and a mammogram every year.  Breast cancer gene (BRCA) assessment is recommended for women who have family members with BRCA-related cancers. BRCA-related cancers include:  Breast.  Ovarian.  Tubal.  Peritoneal cancers.  Results of the assessment will determine the need for genetic counseling and BRCA1 and BRCA2 testing. Cervical Cancer  Your health care provider may recommend that you be screened regularly for cancer of the pelvic organs (ovaries, uterus, and vagina). This screening involves a pelvic examination, including checking for microscopic changes to the surface of your cervix (Pap test). You may be encouraged to have this screening done every 3 years, beginning at age 7.  For women ages 66-65, health care providers may recommend pelvic exams and Pap testing every 3 years, or they may recommend the Pap and pelvic exam, combined with testing for human papilloma virus (HPV), every 5 years. Some types of HPV increase your risk of cervical cancer. Testing for HPV may also be done on women of any age with unclear Pap test results.  Other health care providers may not recommend any screening for nonpregnant women who are considered low risk for pelvic cancer and who do not have symptoms. Ask your health care provider if a screening pelvic exam is right for you.  If you have had past treatment for cervical cancer or a condition that could lead to cancer, you need Pap tests and screening for  cancer for at least 20 years after your treatment. If Pap tests have been discontinued, your risk factors (such as having a new sexual partner) need to be reassessed to determine if screening should resume. Some women have medical problems that increase the chance of getting cervical cancer. In these cases, your health care provider may recommend more frequent screening and Pap tests. Colorectal Cancer  This type of cancer can be detected and often prevented.  Routine colorectal cancer screening usually begins at 80 years of age and continues through 80 years of age.  Your health care provider may recommend screening at an earlier age if you have risk factors for colon cancer.  Your health care provider may also recommend using home test kits to check for hidden blood in the stool.  A small camera at the end of a tube can be used to examine your colon directly (sigmoidoscopy or colonoscopy). This is done to check for the earliest forms of colorectal cancer.  Routine screening usually begins at age 51.  Direct examination of the colon should be repeated every 5-10 years through 80 years of age. However, you may need to be screened more often if early forms of precancerous polyps or small growths are found. Skin Cancer  Check your skin from head to toe regularly.  Tell your health care provider about any new moles or  changes in moles, especially if there is a change in a mole's shape or color.  Also tell your health care provider if you have a mole that is larger than the size of a pencil eraser.  Always use sunscreen. Apply sunscreen liberally and repeatedly throughout the day.  Protect yourself by wearing long sleeves, pants, a wide-brimmed hat, and sunglasses whenever you are outside. Heart disease, diabetes, and high blood pressure  High blood pressure causes heart disease and increases the risk of stroke. High blood pressure is more likely to develop in:  People who have blood  pressure in the high end of the normal range (130-139/85-89 mm Hg).  People who are overweight or obese.  People who are African American.  If you are 46-9 years of age, have your blood pressure checked every 3-5 years. If you are 28 years of age or older, have your blood pressure checked every year. You should have your blood pressure measured twice-once when you are at a hospital or clinic, and once when you are not at a hospital or clinic. Record the average of the two measurements. To check your blood pressure when you are not at a hospital or clinic, you can use:  An automated blood pressure machine at a pharmacy.  A home blood pressure monitor.  If you are between 5 years and 54 years old, ask your health care provider if you should take aspirin to prevent strokes.  Have regular diabetes screenings. This involves taking a blood sample to check your fasting blood sugar level.  If you are at a normal weight and have a low risk for diabetes, have this test once every three years after 80 years of age.  If you are overweight and have a high risk for diabetes, consider being tested at a younger age or more often. Preventing infection Hepatitis B  If you have a higher risk for hepatitis B, you should be screened for this virus. You are considered at high risk for hepatitis B if:  You were born in a country where hepatitis B is common. Ask your health care provider which countries are considered high risk.  Your parents were born in a high-risk country, and you have not been immunized against hepatitis B (hepatitis B vaccine).  You have HIV or AIDS.  You use needles to inject street drugs.  You live with someone who has hepatitis B.  You have had sex with someone who has hepatitis B.  You get hemodialysis treatment.  You take certain medicines for conditions, including cancer, organ transplantation, and autoimmune conditions. Hepatitis C  Blood testing is recommended  for:  Everyone born from [birth year] through 1963.  Anyone with known risk factors for hepatitis C. Sexually transmitted infections (STIs)  You should be screened for sexually transmitted infections (STIs) including gonorrhea and chlamydia if:  You are sexually active and are younger than 80 years of age.  You are older than 80 years of age and your health care provider tells you that you are at risk for this type of infection.  Your sexual activity has changed since you were last screened and you are at an increased risk for chlamydia or gonorrhea. Ask your health care provider if you are at risk.  If you do not have HIV, but are at risk, it may be recommended that you take a prescription medicine daily to prevent HIV infection. This is called pre-exposure prophylaxis (PrEP). You are considered at risk if:  You are sexually  active and do not regularly use condoms or know the HIV status of your partner(s).  You take drugs by injection.  You are sexually active with a partner who has HIV. Talk with your health care provider about whether you are at high risk of being infected with HIV. If you choose to begin PrEP, you should first be tested for HIV. You should then be tested every 3 months for as long as you are taking PrEP. Pregnancy  If you are premenopausal and you may become pregnant, ask your health care provider about preconception counseling.  If you may become pregnant, take 400 to 800 micrograms (mcg) of folic acid every day.  If you want to prevent pregnancy, talk to your health care provider about birth control (contraception). Osteoporosis and menopause  Osteoporosis is a disease in which the bones lose minerals and strength with aging. This can result in serious bone fractures. Your risk for osteoporosis can be identified using a bone density scan.  If you are 10 years of age or older, or if you are at risk for osteoporosis and fractures, ask your health care provider if you  should be screened.  Ask your health care provider whether you should take a calcium or vitamin D supplement to lower your risk for osteoporosis.  Menopause may have certain physical symptoms and risks.  Hormone replacement therapy may reduce some of these symptoms and risks. Talk to your health care provider about whether hormone replacement therapy is right for you. Follow these instructions at home:  Schedule regular health, dental, and eye exams.  Stay current with your immunizations.  Do not use any tobacco products including cigarettes, chewing tobacco, or electronic cigarettes.  If you are pregnant, do not drink alcohol.  If you are breastfeeding, limit how much and how often you drink alcohol.  Limit alcohol intake to no more than 1 drink per day for nonpregnant women. One drink equals 12 ounces of beer, 5 ounces of wine, or 1 ounces of hard liquor.  Do not use street drugs.  Do not share needles.  Ask your health care provider for help if you need support or information about quitting drugs.  Tell your health care provider if you often feel depressed.  Tell your health care provider if you have ever been abused or do not feel safe at home. This information is not intended to replace advice given to you by your health care provider. Make sure you discuss any questions you have with your health care provider. Document Released: 03/26/2011 Document Revised: 02/16/2016 Document Reviewed: 06/14/2015  2017 Elsevier

## 2016-08-09 NOTE — Progress Notes (Signed)
Pre visit review using our clinic review tool, if applicable. No additional management support is needed unless otherwise documented below in the visit note. 

## 2016-08-09 NOTE — Patient Instructions (Addendum)
It was a pleasure seeing you today!  Please take medication as directed and follow up with your provider if your symptoms do not improve in 2 to 3 days, worsen, or you develop new symptoms.  Acute Urinary Retention, Female Urinary retention means you are unable to pee completely or at all (empty your bladder). Follow these instructions at home:  Drink enough fluids to keep your pee (urine) clear or pale yellow.  If you are sent home with a tube that drains the bladder (catheter), there will be a drainage bag attached to it. There are two types of bags. One is big that you can wear at night without having to empty it. One is smaller and needs to be emptied more often.  Keep the drainage bag emptied.  Keep the drainage bag lower than the tube.  Only take medicine as told by your doctor. Contact a doctor if:  You have a low-grade fever.  You have spasms or you are leaking pee when you have spasms. Get help right away if:  You have chills or a fever.  Your catheter stops draining pee.  Your catheter falls out.  You have increased bleeding that does not stop after you have rested and increased the amount of fluids you had been drinking. This information is not intended to replace advice given to you by your health care provider. Make sure you discuss any questions you have with your health care provider. Document Released: 02/27/2008 Document Revised: 02/16/2016 Document Reviewed: 02/19/2013 Elsevier Interactive Patient Education  2017 [employer]
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
## 2016-08-10 DIAGNOSIS — H43813 Vitreous degeneration, bilateral: Secondary | ICD-10-CM | POA: Diagnosis not present

## 2016-08-10 DIAGNOSIS — H353212 Exudative age-related macular degeneration, right eye, with inactive choroidal neovascularization: Secondary | ICD-10-CM | POA: Diagnosis not present

## 2016-08-10 DIAGNOSIS — H34231 Retinal artery branch occlusion, right eye: Secondary | ICD-10-CM | POA: Diagnosis not present

## 2016-08-10 DIAGNOSIS — H353123 Nonexudative age-related macular degeneration, left eye, advanced atrophic without subfoveal involvement: Secondary | ICD-10-CM | POA: Diagnosis not present

## 2016-08-10 NOTE — Progress Notes (Signed)
I agree with the findings on this Medicare Annual Wellness Visit by Montine CircleSusan Hauck, RN. Roddie McJulia Lott Seelbach, FNP

## 2016-08-12 LAB — URINE CULTURE

## 2016-09-04 ENCOUNTER — Telehealth: Payer: Self-pay | Admitting: Internal Medicine

## 2016-09-04 NOTE — Telephone Encounter (Addendum)
Patient's daughter wanted Erin Berkshirerisha Gibson's to call and requested a RX refill on clonazePAM (KLONOPIN) 0.5 MG tablet. Patient has been out since last Tuesday. Patient has an appointment schedule 10/09/2016.  Contact Info: [name] [phone] or [name] [phone]  Pharmacy: [facility] [address]

## 2016-09-04 NOTE — Telephone Encounter (Signed)
Okay to refill? 

## 2016-09-04 NOTE — Telephone Encounter (Signed)
Please see message and advise if it is ok to refill clonazepam.

## 2016-09-05 ENCOUNTER — Other Ambulatory Visit: Payer: Self-pay | Admitting: Internal Medicine

## 2016-09-05 MED ORDER — CLONAZEPAM 0.5 MG PO TABS: 0.2500 mg | ORAL_TABLET | Freq: Three times a day (TID) | ORAL | 1 refills | Status: DC | PRN

## 2016-09-05 NOTE — Telephone Encounter (Signed)
Pt notified Rx ready for pickup. Rx printed and signed.  

## 2016-09-28 ENCOUNTER — Inpatient Hospital Stay (HOSPITAL_COMMUNITY)
Admission: EM | Admit: 2016-09-28 | Discharge: 2016-10-05 | DRG: 388 | Disposition: A | Discharge status: Home / Self Care | Payer: Medicare Other | Attending: Family Medicine | Admitting: Family Medicine

## 2016-09-28 ENCOUNTER — Emergency Department (HOSPITAL_COMMUNITY): Payer: Medicare Other

## 2016-09-28 ENCOUNTER — Encounter (HOSPITAL_COMMUNITY): Payer: Self-pay | Admitting: Emergency Medicine

## 2016-09-28 DIAGNOSIS — K219 Gastro-esophageal reflux disease without esophagitis: Secondary | ICD-10-CM | POA: Diagnosis present

## 2016-09-28 DIAGNOSIS — N390 Urinary tract infection, site not specified: Secondary | ICD-10-CM | POA: Diagnosis not present

## 2016-09-28 DIAGNOSIS — Z90710 Acquired absence of both cervix and uterus: Secondary | ICD-10-CM

## 2016-09-28 DIAGNOSIS — K56609 Unspecified intestinal obstruction, unspecified as to partial versus complete obstruction: Secondary | ICD-10-CM | POA: Diagnosis present

## 2016-09-28 DIAGNOSIS — K5730 Diverticulosis of large intestine without perforation or abscess without bleeding: Secondary | ICD-10-CM | POA: Diagnosis present

## 2016-09-28 DIAGNOSIS — I272 Pulmonary hypertension, unspecified: Secondary | ICD-10-CM | POA: Diagnosis present

## 2016-09-28 DIAGNOSIS — I25119 Atherosclerotic heart disease of native coronary artery with unspecified angina pectoris: Secondary | ICD-10-CM | POA: Diagnosis present

## 2016-09-28 DIAGNOSIS — I081 Rheumatic disorders of both mitral and tricuspid valves: Secondary | ICD-10-CM | POA: Diagnosis present

## 2016-09-28 DIAGNOSIS — I240 Acute coronary thrombosis not resulting in myocardial infarction: Secondary | ICD-10-CM | POA: Diagnosis not present

## 2016-09-28 DIAGNOSIS — M1990 Unspecified osteoarthritis, unspecified site: Secondary | ICD-10-CM | POA: Diagnosis present

## 2016-09-28 DIAGNOSIS — Y929 Unspecified place or not applicable: Secondary | ICD-10-CM | POA: Diagnosis not present

## 2016-09-28 DIAGNOSIS — Z886 Allergy status to analgesic agent status: Secondary | ICD-10-CM

## 2016-09-28 DIAGNOSIS — F419 Anxiety disorder, unspecified: Secondary | ICD-10-CM | POA: Diagnosis present

## 2016-09-28 DIAGNOSIS — I440 Atrioventricular block, first degree: Secondary | ICD-10-CM | POA: Diagnosis not present

## 2016-09-28 DIAGNOSIS — R1084 Generalized abdominal pain: Secondary | ICD-10-CM | POA: Diagnosis not present

## 2016-09-28 DIAGNOSIS — A0811 Acute gastroenteropathy due to Norwalk agent: Secondary | ICD-10-CM | POA: Diagnosis not present

## 2016-09-28 DIAGNOSIS — I714 Abdominal aortic aneurysm, without rupture: Secondary | ICD-10-CM | POA: Diagnosis present

## 2016-09-28 DIAGNOSIS — I420 Dilated cardiomyopathy: Secondary | ICD-10-CM | POA: Diagnosis present

## 2016-09-28 DIAGNOSIS — X58XXXA Exposure to other specified factors, initial encounter: Secondary | ICD-10-CM | POA: Diagnosis present

## 2016-09-28 DIAGNOSIS — I208 Other forms of angina pectoris: Secondary | ICD-10-CM | POA: Diagnosis not present

## 2016-09-28 DIAGNOSIS — F329 Major depressive disorder, single episode, unspecified: Secondary | ICD-10-CM | POA: Diagnosis present

## 2016-09-28 DIAGNOSIS — Z6821 Body mass index (BMI) 21.0-21.9, adult: Secondary | ICD-10-CM

## 2016-09-28 DIAGNOSIS — Z8249 Family history of ischemic heart disease and other diseases of the circulatory system: Secondary | ICD-10-CM

## 2016-09-28 DIAGNOSIS — Z87440 Personal history of urinary (tract) infections: Secondary | ICD-10-CM | POA: Diagnosis not present

## 2016-09-28 DIAGNOSIS — N183 Chronic kidney disease, stage 3 unspecified: Secondary | ICD-10-CM | POA: Diagnosis present

## 2016-09-28 DIAGNOSIS — Z9181 History of falling: Secondary | ICD-10-CM

## 2016-09-28 DIAGNOSIS — R109 Unspecified abdominal pain: Secondary | ICD-10-CM | POA: Diagnosis not present

## 2016-09-28 DIAGNOSIS — I214 Non-ST elevation (NSTEMI) myocardial infarction: Secondary | ICD-10-CM | POA: Diagnosis not present

## 2016-09-28 DIAGNOSIS — R140 Abdominal distension (gaseous): Secondary | ICD-10-CM

## 2016-09-28 DIAGNOSIS — I5021 Acute systolic (congestive) heart failure: Secondary | ICD-10-CM | POA: Diagnosis not present

## 2016-09-28 DIAGNOSIS — K567 Ileus, unspecified: Principal | ICD-10-CM | POA: Diagnosis present

## 2016-09-28 DIAGNOSIS — R531 Weakness: Secondary | ICD-10-CM | POA: Diagnosis not present

## 2016-09-28 DIAGNOSIS — E43 Unspecified severe protein-calorie malnutrition: Secondary | ICD-10-CM | POA: Diagnosis present

## 2016-09-28 DIAGNOSIS — Z66 Do not resuscitate: Secondary | ICD-10-CM | POA: Diagnosis present

## 2016-09-28 DIAGNOSIS — Y939 Activity, unspecified: Secondary | ICD-10-CM | POA: Diagnosis not present

## 2016-09-28 DIAGNOSIS — R079 Chest pain, unspecified: Secondary | ICD-10-CM | POA: Diagnosis not present

## 2016-09-28 DIAGNOSIS — Z7982 Long term (current) use of aspirin: Secondary | ICD-10-CM | POA: Diagnosis not present

## 2016-09-28 DIAGNOSIS — Z885 Allergy status to narcotic agent status: Secondary | ICD-10-CM

## 2016-09-28 DIAGNOSIS — R112 Nausea with vomiting, unspecified: Secondary | ICD-10-CM

## 2016-09-28 DIAGNOSIS — Z888 Allergy status to other drugs, medicaments and biological substances status: Secondary | ICD-10-CM

## 2016-09-28 DIAGNOSIS — K56699 Other intestinal obstruction unspecified as to partial versus complete obstruction: Secondary | ICD-10-CM | POA: Diagnosis not present

## 2016-09-28 DIAGNOSIS — R0789 Other chest pain: Secondary | ICD-10-CM | POA: Diagnosis not present

## 2016-09-28 DIAGNOSIS — S161XXA Strain of muscle, fascia and tendon at neck level, initial encounter: Secondary | ICD-10-CM | POA: Diagnosis not present

## 2016-09-28 DIAGNOSIS — N993 Prolapse of vaginal vault after hysterectomy: Secondary | ICD-10-CM | POA: Diagnosis present

## 2016-09-28 DIAGNOSIS — E162 Hypoglycemia, unspecified: Secondary | ICD-10-CM | POA: Diagnosis not present

## 2016-09-28 DIAGNOSIS — Z9849 Cataract extraction status, unspecified eye: Secondary | ICD-10-CM

## 2016-09-28 DIAGNOSIS — Z96653 Presence of artificial knee joint, bilateral: Secondary | ICD-10-CM | POA: Diagnosis present

## 2016-09-28 DIAGNOSIS — I10 Essential (primary) hypertension: Secondary | ICD-10-CM | POA: Diagnosis present

## 2016-09-28 DIAGNOSIS — K4090 Unilateral inguinal hernia, without obstruction or gangrene, not specified as recurrent: Secondary | ICD-10-CM | POA: Diagnosis present

## 2016-09-28 DIAGNOSIS — I130 Hypertensive heart and chronic kidney disease with heart failure and stage 1 through stage 4 chronic kidney disease, or unspecified chronic kidney disease: Secondary | ICD-10-CM | POA: Diagnosis not present

## 2016-09-28 DIAGNOSIS — R1032 Left lower quadrant pain: Secondary | ICD-10-CM | POA: Diagnosis not present

## 2016-09-28 DIAGNOSIS — K2970 Gastritis, unspecified, without bleeding: Secondary | ICD-10-CM | POA: Diagnosis not present

## 2016-09-28 DIAGNOSIS — R296 Repeated falls: Secondary | ICD-10-CM | POA: Diagnosis present

## 2016-09-28 DIAGNOSIS — R197 Diarrhea, unspecified: Secondary | ICD-10-CM | POA: Diagnosis not present

## 2016-09-28 DIAGNOSIS — A0472 Enterocolitis due to Clostridium difficile, not specified as recurrent: Secondary | ICD-10-CM | POA: Diagnosis not present

## 2016-09-28 DIAGNOSIS — Z86718 Personal history of other venous thrombosis and embolism: Secondary | ICD-10-CM

## 2016-09-28 LAB — CBC WITH DIFFERENTIAL/PLATELET
BASOS ABS: 0 10*3/uL (ref 0.0–0.1)
BASOS PCT: 0 %
Eosinophils Absolute: 0 10*3/uL (ref 0.0–0.7)
Eosinophils Relative: 0 %
HCT: 37.9 % (ref 36.0–46.0)
Hemoglobin: 12.2 g/dL (ref 12.0–15.0)
Lymphocytes Relative: 3 %
Lymphs Abs: 0.2 10*3/uL — ABNORMAL LOW (ref 0.7–4.0)
MCH: 28.2 pg (ref 26.0–34.0)
MCHC: 32.2 g/dL (ref 30.0–36.0)
MCV: 87.5 fL (ref 78.0–100.0)
MONO ABS: 0.1 10*3/uL (ref 0.1–1.0)
Monocytes Relative: 2 %
NEUTROS ABS: 6.3 10*3/uL (ref 1.7–7.7)
NEUTROS PCT: 95 %
Platelets: 233 10*3/uL (ref 150–400)
RBC: 4.33 MIL/uL (ref 3.87–5.11)
RDW: 14.6 % (ref 11.5–15.5)
WBC: 6.6 10*3/uL (ref 4.0–10.5)

## 2016-09-28 LAB — I-STAT CHEM 8, ED
BUN: 31 mg/dL — AB (ref 6–20)
CALCIUM ION: 1.19 mmol/L (ref 1.15–1.40)
Chloride: 111 mmol/L (ref 101–111)
Creatinine, Ser: 1.1 mg/dL — ABNORMAL HIGH (ref 0.44–1.00)
GLUCOSE: 156 mg/dL — AB (ref 65–99)
HCT: 39 % (ref 36.0–46.0)
Hemoglobin: 13.3 g/dL (ref 12.0–15.0)
Potassium: 3.7 mmol/L (ref 3.5–5.1)
Sodium: 145 mmol/L (ref 135–145)
TCO2: 23 mmol/L (ref 0–100)

## 2016-09-28 LAB — URINALYSIS, ROUTINE W REFLEX MICROSCOPIC
BILIRUBIN URINE: NEGATIVE
GLUCOSE, UA: NEGATIVE mg/dL
Ketones, ur: 5 mg/dL — AB
NITRITE: POSITIVE — AB
PROTEIN: 30 mg/dL — AB
SPECIFIC GRAVITY, URINE: 1.017 (ref 1.005–1.030)
pH: 5 (ref 5.0–8.0)

## 2016-09-28 LAB — COMPREHENSIVE METABOLIC PANEL
ALT: 13 U/L — ABNORMAL LOW (ref 14–54)
ANION GAP: 9 (ref 5–15)
AST: 19 U/L (ref 15–41)
Albumin: 3.5 g/dL (ref 3.5–5.0)
Alkaline Phosphatase: 115 U/L (ref 38–126)
BUN: 32 mg/dL — ABNORMAL HIGH (ref 6–20)
CHLORIDE: 110 mmol/L (ref 101–111)
CO2: 22 mmol/L (ref 22–32)
CREATININE: 1.07 mg/dL — AB (ref 0.44–1.00)
Calcium: 9 mg/dL (ref 8.9–10.3)
GFR, EST AFRICAN AMERICAN: 50 mL/min — AB (ref >60)
GFR, EST NON AFRICAN AMERICAN: 43 mL/min — AB (ref >60)
Glucose, Bld: 159 mg/dL — ABNORMAL HIGH (ref 65–99)
POTASSIUM: 3.6 mmol/L (ref 3.5–5.1)
SODIUM: 141 mmol/L (ref 135–145)
Total Bilirubin: 0.7 mg/dL (ref 0.3–1.2)
Total Protein: 6.3 g/dL — ABNORMAL LOW (ref 6.5–8.1)

## 2016-09-28 LAB — I-STAT CG4 LACTIC ACID, ED: LACTIC ACID, VENOUS: 0.81 mmol/L (ref 0.5–1.9)

## 2016-09-28 LAB — LIPASE, BLOOD
LIPASE: 28 U/L (ref 11–51)
LIPASE: 17 U/L (ref 11–51)

## 2016-09-28 LAB — TROPONIN I

## 2016-09-28 LAB — INFLUENZA PANEL BY PCR (TYPE A & B)
INFLBPCR: NEGATIVE
Influenza A By PCR: NEGATIVE

## 2016-09-28 MED ORDER — SODIUM CHLORIDE 0.9 % IV SOLN
INTRAVENOUS | Status: DC
  Administered 2016-09-28 – 2016-09-30 (×4): via INTRAVENOUS
  Filled 2016-09-28 (×6): qty 1000

## 2016-09-28 MED ORDER — DEXTROSE 5 % IV SOLN
1.0000 g | Freq: Once | INTRAVENOUS | Status: AC
  Administered 2016-09-28: 1 g via INTRAVENOUS
  Filled 2016-09-28: qty 10

## 2016-09-28 MED ORDER — NITROGLYCERIN 0.4 MG SL SUBL
0.4000 mg | SUBLINGUAL_TABLET | SUBLINGUAL | Status: DC | PRN
  Administered 2016-09-28 – 2016-10-01 (×2): 0.4 mg via SUBLINGUAL
  Filled 2016-09-28 (×3): qty 1

## 2016-09-28 MED ORDER — ACETAMINOPHEN 650 MG RE SUPP: 650.0000 mg | Freq: Four times a day (QID) | RECTAL | Status: DC | PRN

## 2016-09-28 MED ORDER — MORPHINE SULFATE (PF) 2 MG/ML IV SOLN
1.0000 mg | INTRAVENOUS | Status: DC | PRN
  Administered 2016-10-02 – 2016-10-03 (×2): 1 mg via INTRAVENOUS
  Filled 2016-09-28 (×2): qty 1

## 2016-09-28 MED ORDER — IOPAMIDOL (ISOVUE-300) INJECTION 61%
80.0000 mL | Freq: Once | INTRAVENOUS | Status: AC | PRN
  Administered 2016-09-28: 80 mL via INTRAVENOUS

## 2016-09-28 MED ORDER — MORPHINE SULFATE (PF) 2 MG/ML IV SOLN
1.0000 mg | Freq: Once | INTRAVENOUS | Status: DC
  Filled 2016-09-28: qty 1

## 2016-09-28 MED ORDER — ACETAMINOPHEN 10 MG/ML IV SOLN
1000.0000 mg | Freq: Once | INTRAVENOUS | Status: AC
  Administered 2016-09-28: 17:00:00 1000 mg via INTRAVENOUS
  Filled 2016-09-28: qty 100

## 2016-09-28 MED ORDER — IOPAMIDOL (ISOVUE-300) INJECTION 61%
INTRAVENOUS | Status: AC
  Administered 2016-09-28: 30 mL via ORAL
  Filled 2016-09-28: qty 30

## 2016-09-28 MED ORDER — ACETAMINOPHEN 325 MG PO TABS
650.0000 mg | ORAL_TABLET | Freq: Four times a day (QID) | ORAL | Status: DC | PRN
  Administered 2016-10-01 – 2016-10-04 (×2): 650 mg via ORAL
  Filled 2016-09-28 (×3): qty 2

## 2016-09-28 MED ORDER — ONDANSETRON HCL 4 MG/2ML IJ SOLN
4.0000 mg | Freq: Once | INTRAMUSCULAR | Status: AC
  Administered 2016-09-28: 4 mg via INTRAVENOUS
  Filled 2016-09-28: qty 2

## 2016-09-28 MED ORDER — SODIUM CHLORIDE 0.9 % IV BOLUS (SEPSIS)
1000.0000 mL | Freq: Once | INTRAVENOUS | Status: AC
  Administered 2016-09-28: 1000 mL via INTRAVENOUS

## 2016-09-28 MED ORDER — ENOXAPARIN SODIUM 30 MG/0.3ML ~~LOC~~ SOLN
30.0000 mg | SUBCUTANEOUS | Status: DC
  Administered 2016-09-28 – 2016-09-30 (×3): 30 mg via SUBCUTANEOUS
  Filled 2016-09-28 (×3): qty 0.3

## 2016-09-28 MED ORDER — MORPHINE SULFATE (PF) 2 MG/ML IV SOLN
2.0000 mg | Freq: Once | INTRAVENOUS | Status: AC
  Administered 2016-09-28: 2 mg via INTRAVENOUS
  Filled 2016-09-28: qty 1

## 2016-09-28 MED ORDER — IOPAMIDOL (ISOVUE-300) INJECTION 61%
30.0000 mL | Freq: Once | INTRAVENOUS | Status: AC | PRN
  Administered 2016-09-28: 30 mL via ORAL

## 2016-09-28 MED ORDER — ONDANSETRON HCL 4 MG/2ML IJ SOLN
4.0000 mg | Freq: Four times a day (QID) | INTRAMUSCULAR | Status: DC | PRN
Start: 2016-09-28 — End: 2016-10-05

## 2016-09-28 MED ORDER — ONDANSETRON HCL 4 MG PO TABS: 4.0000 mg | ORAL_TABLET | Freq: Four times a day (QID) | ORAL | Status: DC | PRN

## 2016-09-28 MED ORDER — DEXTROSE 5 % IV SOLN
1.0000 g | INTRAVENOUS | Status: DC
  Administered 2016-09-29 – 2016-10-02 (×4): 1 g via INTRAVENOUS
  Filled 2016-09-28 (×5): qty 10

## 2016-09-28 MED ORDER — HYDRALAZINE HCL 20 MG/ML IJ SOLN: 5.0000 mg | Freq: Four times a day (QID) | INTRAMUSCULAR | Status: DC | PRN

## 2016-09-28 MED ORDER — IOPAMIDOL (ISOVUE-300) INJECTION 61%
INTRAVENOUS | Status: AC
  Filled 2016-09-28: qty 100

## 2016-09-28 NOTE — Consult Note (Signed)
Reason for Consult:SBO vs Ileus Referring Physician:  Dr. Merrie Roof (ED) PCP:  Nyoka Cowden, MD  Erin Gibson is an 83 y.o. female.   CC:  NAUSEA AND VOMITING FOR 48 HOURS, None since 0730 this AM.  HPI: Pt returns with possible SBO vs ileus..She was last seen 06/06/16 with the same issue. I also see a CT from 02/2013 when she was here with this issue.  Her daughter says she has about 1 per year. She has a hx of abdominal hysterectomy, open cholecystectomy, and hernia repair.  During her last admit in 05/2016, she also had a Klebsiella UTI, chronic stage III kidney disease.  Symptoms resolved with bowel rest and Rx of the UTI. She reports nausea and vomiting for 48 hours, soft stool last PM.  No vomiting since 0730 this AM. She isn't really complaing of pain.  Her complaints of pain are vague and she points to RUQ and to the left. She sit up without any issue.  Work up today shows she is afebrile, HR 55-67.  BP is stable. Sats are stable K+ 3.7, glucose 156, creatinine 1.10, normal CBC and probable UTI (positive nitrates)  CT shows:  Very mild distention of multiple loops of fluid-filled small bowel throughout the abdomen without true obstruction or transition point. Similarly, the colon is filled with liquid stool including the rectum. There may be mild hyperenhancement of the mucosal surface of the rectum without associated submucosal edema or thickening. No free air or free fluid. The left lower quadrant inguinal hernia contains trace fluid and a portion of the anterior wall of the sigmoid colon. Given the chronic presence of fluid an omental fat within the hernia sac, superimposed inflammatory changes difficult to assess.  We are ask to see.   Past Medical History:  Diagnosis Date  . ANEMIA 03/31/2009  . Chronic kidney disease   . DEGENERATIVE JOINT DISEASE, GENERALIZED 05/06/2007  . Diverticulosis of sigmoid colon    Colonoscopy 2011  . DVT (deep venous thrombosis) (HCC)    hx of  .  GERD 04/24/2007  . HYPERTENSION 04/24/2007  . VENOUS STASIS ULCER 07/20/2008    Past Surgical History:  Procedure Laterality Date  . ABDOMINAL HYSTERECTOMY     Completion hysterectomy  . CATARACT EXTRACTION    . CHOLECYSTECTOMY OPEN    . HERNIA REPAIR    . PARTIAL HYSTERECTOMY     Salpingo-oophorectomy as well  . TOTAL KNEE ARTHROPLASTY     x2    No family history on file.  Social History:  reports that she has never smoked. She has never used smokeless tobacco. She reports that she does Erin drink alcohol or use drugs.  Allergies:  Allergies  Allergen Reactions  . Tramadol Other (See Comments)    Auditory and visual hallucinations  . Codeine Nausea And Vomiting    Violently ill  . Ibuprofen Nausea And Vomiting  . Diltiazem Hcl Other (See Comments)    Erin sure about this reaction    Prior to Admission medications   Medication Sig Start Date End Date Taking? Authorizing Provider  acetaminophen (TYLENOL) 325 MG tablet Take 2 tablets (650 mg total) by mouth every 4 (four) hours as needed for mild pain or moderate pain. Patient taking differently: Take 650 mg by mouth at bedtime.  01/02/16  Yes Velvet Bathe, MD  amLODipine (NORVASC) 2.5 MG tablet Take 1 tablet (2.5 mg total) by mouth daily. 05/22/16  Yes Marletta Lor, MD  aspirin EC 81 MG  tablet Take 81 mg by mouth every morning.   Yes Historical Provider, MD  clonazePAM (KLONOPIN) 0.5 MG tablet Take 0.5 tablets (0.25 mg total) by mouth 3 (three) times daily as needed for anxiety. 09/05/16  Yes Marletta Lor, MD  Cyanocobalamin (VITAMIN B-12) 2500 MCG SUBL Place 2,500 mcg under the tongue every morning.    Yes Historical Provider, MD  furosemide (LASIX) 40 MG tablet TAKE 1/2 TABLET (=20MG)    DAILY Patient taking differently: Take 20 mg by mouth daily. TAKE 1/2 TABLET (=20MG)    DAILY 05/22/16  Yes Marletta Lor, MD  isosorbide mononitrate (IMDUR) 30 MG 24 hr tablet Take 1 tablet (30 mg total) by mouth daily.  05/22/16  Yes Marletta Lor, MD  lisinopril (PRINIVIL,ZESTRIL) 20 MG tablet Take 1 tablet (20 mg total) by mouth daily. 08/02/16  Yes Marletta Lor, MD  Melatonin 3 MG TABS Take 3 mg by mouth at bedtime.   Yes Historical Provider, MD  Multiple Vitamins-Minerals (PRESERVISION AREDS 2) CAPS Take 1 capsule by mouth daily. 10/06/15  Yes Marletta Lor, MD  nitroGLYCERIN (NITROSTAT) 0.4 MG SL tablet Place 1 tablet (0.4 mg total) under the tongue every 5 (five) minutes as needed for chest pain. 04/20/16  Yes Marletta Lor, MD  Polyethyl Glycol-Propyl Glycol (SYSTANE) 0.4-0.3 % SOLN Place 2 drops into both eyes 2 (two) times daily.   Yes Historical Provider, MD  polyethylene glycol powder (GLYCOLAX/MIRALAX) powder MIX 17GM IN LIQUID AND     DRINK TWO TIMES A DAY 10/06/15  Yes Marletta Lor, MD  sertraline (ZOLOFT) 25 MG tablet Take 1 tablet (25 mg total) by mouth daily. 08/02/16  Yes Marletta Lor, MD  cefdinir (OMNICEF) 300 MG capsule Take 1 capsule (300 mg total) by mouth daily. Patient Erin taking: Reported on 09/28/2016 08/09/16   Delano Metz, FNP     Results for orders placed or performed during the hospital encounter of 09/28/16 (from the past 48 hour(s))  Comprehensive metabolic panel     Status: Abnormal   Collection Time: 09/28/16 10:34 AM  Result Value Ref Range   Sodium 141 135 - 145 mmol/L   Potassium 3.6 3.5 - 5.1 mmol/L   Chloride 110 101 - 111 mmol/L   CO2 22 22 - 32 mmol/L   Glucose, Bld 159 (H) 65 - 99 mg/dL   BUN 32 (H) 6 - 20 mg/dL   Creatinine, Ser 1.07 (H) 0.44 - 1.00 mg/dL   Calcium 9.0 8.9 - 10.3 mg/dL   Total Protein 6.3 (L) 6.5 - 8.1 g/dL   Albumin 3.5 3.5 - 5.0 g/dL   AST 19 15 - 41 U/L   ALT 13 (L) 14 - 54 U/L   Alkaline Phosphatase 115 38 - 126 U/L   Total Bilirubin 0.7 0.3 - 1.2 mg/dL   GFR calc non Af Amer 43 (L) >60 mL/min   GFR calc Af Amer 50 (L) >60 mL/min    Comment: (NOTE) The eGFR has been calculated using the CKD EPI  equation. This calculation has Erin been validated in all clinical situations. eGFR's persistently <60 mL/min signify possible Chronic Kidney Disease.    Anion gap 9 5 - 15  Lipase, blood     Status: None   Collection Time: 09/28/16 10:34 AM  Result Value Ref Range   Lipase 28 11 - 51 U/L  CBC WITH DIFFERENTIAL     Status: Abnormal   Collection Time: 09/28/16 10:34 AM  Result Value  Ref Range   WBC 6.6 4.0 - 10.5 K/uL   RBC 4.33 3.87 - 5.11 MIL/uL   Hemoglobin 12.2 12.0 - 15.0 g/dL   HCT 37.9 36.0 - 46.0 %   MCV 87.5 78.0 - 100.0 fL   MCH 28.2 26.0 - 34.0 pg   MCHC 32.2 30.0 - 36.0 g/dL   RDW 14.6 11.5 - 15.5 %   Platelets 233 150 - 400 K/uL   Neutrophils Relative % 95 %   Neutro Abs 6.3 1.7 - 7.7 K/uL   Lymphocytes Relative 3 %   Lymphs Abs 0.2 (L) 0.7 - 4.0 K/uL   Monocytes Relative 2 %   Monocytes Absolute 0.1 0.1 - 1.0 K/uL   Eosinophils Relative 0 %   Eosinophils Absolute 0.0 0.0 - 0.7 K/uL   Basophils Relative 0 %   Basophils Absolute 0.0 0.0 - 0.1 K/uL  I-Stat CG4 Lactic Acid, ED     Status: None   Collection Time: 09/28/16 10:44 AM  Result Value Ref Range   Lactic Acid, Venous 0.81 0.5 - 1.9 mmol/L  I-Stat Chem 8, ED     Status: Abnormal   Collection Time: 09/28/16 10:44 AM  Result Value Ref Range   Sodium 145 135 - 145 mmol/L   Potassium 3.7 3.5 - 5.1 mmol/L   Chloride 111 101 - 111 mmol/L   BUN 31 (H) 6 - 20 mg/dL   Creatinine, Ser 1.10 (H) 0.44 - 1.00 mg/dL   Glucose, Bld 156 (H) 65 - 99 mg/dL   Calcium, Ion 1.19 1.15 - 1.40 mmol/L   TCO2 23 0 - 100 mmol/L   Hemoglobin 13.3 12.0 - 15.0 g/dL   HCT 39.0 36.0 - 46.0 %  Urinalysis, Routine w reflex microscopic     Status: Abnormal   Collection Time: 09/28/16 11:31 AM  Result Value Ref Range   Color, Urine YELLOW YELLOW   APPearance HAZY (A) CLEAR   Specific Gravity, Urine 1.017 1.005 - 1.030   pH 5.0 5.0 - 8.0   Glucose, UA NEGATIVE NEGATIVE mg/dL   Hgb urine dipstick SMALL (A) NEGATIVE   Bilirubin Urine  NEGATIVE NEGATIVE   Ketones, ur 5 (A) NEGATIVE mg/dL   Protein, ur 30 (A) NEGATIVE mg/dL   Nitrite POSITIVE (A) NEGATIVE   Leukocytes, UA TRACE (A) NEGATIVE   RBC / HPF 0-5 0 - 5 RBC/hpf   WBC, UA 0-5 0 - 5 WBC/hpf   Bacteria, UA MANY (A) NONE SEEN   Squamous Epithelial / LPF 0-5 (A) NONE SEEN   Mucous PRESENT    Hyaline Casts, UA PRESENT     Ct Abdomen Pelvis W Contrast  Result Date: 09/28/2016 CLINICAL DATA:  83 year old female with left lower quadrant abdominal pain, nausea and vomiting for the past 2 days EXAM: CT ABDOMEN AND PELVIS WITH CONTRAST TECHNIQUE: Multidetector CT imaging of the abdomen and pelvis was performed using the standard protocol following bolus administration of intravenous contrast. CONTRAST:  14m ISOVUE-300 IOPAMIDOL (ISOVUE-300) INJECTION 61% COMPARISON:  CT abdomen/ pelvis 06/05/2016 and 04/20/2010 FINDINGS: Lower chest: Greater than 6 year stability of a 6 mm right lower lobe pulmonary nodule dating back to July of 2011. Stability is consistent with a benign process. A second small nodular opacity in the inferolateral aspect of the right lower lobe is also stable for more than 6 years and benign. Chronic elevation of the left hemidiaphragm with associated atelectasis. Calcifications of the aortic valve. The heart is normal in size. No pericardial effusion. Unremarkable distal  thoracic esophagus. Hepatobiliary: Similar degree of biliary ductal dilatation over multiple prior studies dating back to 2011. No significant interval change. The distal common bile duct remains dilated at 1.3 cm prior to abruptly tapering at the level of the ampulla. New no visible choledocholithiasis or mass. Pancreas: Relative pancreatic atrophy in the body an neck with a visible but Erin particularly dilated pancreatic duct. Similar findings remain unchanged dating back to 2011. Spleen: Normal in size without focal abnormality. Adrenals/Urinary Tract: Stable 5.6 cm peripherally calcified cyst  arising exophytic from the upper pole of the right kidney. Alternately, this could represent an exophytic cyst arising from hepatic segment 6 and abutting the kidney. In either event, there has been no interval change over multiple prior studies dating back to 2011. Mild renal parenchymal atrophy worse on the left than the right. There are several additional small circumscribed low-attenuation lesions which are too small to characterize but statistically highly likely benign cysts. No hydronephrosis, nephrolithiasis or enhancing mass. Stomach/Bowel: Very mild distention of multiple loops of fluid-filled small bowel throughout the abdomen without true obstruction or transition point. Similarly, the colon is filled with liquid stool including the rectum. There may be mild hyperenhancement of the mucosal surface of the rectum without associated submucosal edema or thickening. No free air or free fluid. The left lower quadrant inguinal hernia contains trace fluid and a portion of the anterior wall of the sigmoid colon. This is a change compared to prior. Given the chronic presence of fluid an omental fat within the hernia sac, superimposed inflammatory changes difficult to assess. Vascular/Lymphatic: Mild fusiform aneurysmal dilatation of the infrarenal abdominal aorta with a maximal diameter of 3.2 cm. This is in significantly changed compared to prior. Scattered atherosclerotic vascular calcifications. 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 Reproductive: Surgically absent. Other: Left lower quadrant inguinal hernia containing herniated omental fat, fluid and the anterior wall of the adjacent sigmoid colon. Musculoskeletal: No acute or significant osseous findings. IMPRESSION: 1. Mildly distended and fluid-filled small bowel throughout the abdomen. No definite transition point. Differential considerations include viral enteritis versus partial small bowel obstruction. 2. Liquid stool noted  throughout the colon. It is the patient also experiencing diarrhea? 3. Slight interval change in the appearance of the left lower quadrant inguinal hernia which now contains a portion of the anterior wall of the adjacent sigmoid colon. However, there is no associated inflammation or evidence of obstruction related to this mild change. 4. Fusiform infrarenal abdominal aortic aneurysm measures 3.2 cm, unchanged. Recommend followup by ultrasound in 3 years. This recommendation follows ACR consensus guidelines: White Paper of the ACR Incidental Findings Committee II on Vascular Findings. J Am Coll Radiol 2013; 59:741-638 5. Numerous additional ancillary findings as above without significant interval change over multiple prior studies. Electronically Signed   By: Jacqulynn Cadet M.D.   On: 09/28/2016 13:06   Dg Chest Portable 1 View  Result Date: 09/28/2016 CLINICAL DATA:  Chest wall pain EXAM: PORTABLE CHEST 1 VIEW COMPARISON:  02/16/2016 FINDINGS: Cardiomediastinal silhouette is stable. Again noted thoracic dextroscoliosis. Stable mild ribcage asymmetry. Old right upper rib fractures are stable. Atherosclerotic calcifications of thoracic aorta. Extensive degenerative changes bilateral shoulders. IMPRESSION: No active disease. Electronically Signed   By: Lahoma Crocker M.D.   On: 09/28/2016 10:33    Review of Systems  Constitutional: Positive for fever (uncertain), malaise/fatigue and weight loss (daughter says she continues to lose weight,Erin sure how much.).       Elderly woman no  acute distress.    HENT: Negative.   Eyes: Negative.   Respiratory: Positive for shortness of breath (DOE). Negative for cough, hemoptysis, sputum production and wheezing.        Bronchitis late last year, better with Abx.  Cardiovascular: Positive for chest pain. Negative for orthopnea, claudication and leg swelling.  Gastrointestinal: Positive for blood in stool (last PM), constipation (chronic issue on Miralax BID at home),  diarrhea (soft stool last PM ? blood in it.), heartburn, nausea and vomiting. Negative for abdominal pain.  Genitourinary: Positive for frequency and urgency. Negative for dysuria, flank pain and hematuria.       Chronic leak, says her bladder comes out at times.  Musculoskeletal: Positive for falls (confined to walker with multiple falls, Erin sure when she had the last.).  Skin: Negative.   Neurological: Positive for weakness and headaches.  Endo/Heme/Allergies: Bruises/bleeds easily.  Psychiatric/Behavioral: Negative.    Blood pressure 150/62, pulse 66, temperature 98.4 F (36.9 C), temperature source Oral, resp. rate 18, height '4\' 10"'  (1.473 m), weight 48.5 kg (107 lb), SpO2 92 %. Physical Exam  Constitutional: She is oriented to person, place, and time. No distress.  Frail elderly WF, uses meals on Wheels, and divides them in 2 for meals, daughter says she has a refrigerator full of other foods she won't eat.  HENT:  Head: Normocephalic and atraumatic.  What looks like old scar left fore head? Fall, she does Erin remember  Eyes: Right eye exhibits no discharge. Left eye exhibits no discharge. No scleral icterus.  Neck: Normal range of motion. Neck supple. No JVD present. No tracheal deviation present. No thyromegaly present.  Cardiovascular: Normal rate, regular rhythm, normal heart sounds and intact distal pulses.   No murmur heard. Respiratory: Effort normal. No respiratory distress. She has no wheezes. She has rales (bases both sides).  GI: Soft. Bowel sounds are normal. She exhibits distension. She exhibits no mass. There is no tenderness. There is no rebound and no guarding.  She has a left lower quad hernia, that is soft and Erin incarcerated.  Midline incision OK. I do Erin see any prolapse of the bladder, but she is red and tender from chronic leakage from her bladder.  Musculoskeletal: She exhibits edema (trace at most). She exhibits no tenderness.  Lymphadenopathy:    She has no  cervical adenopathy.  Neurological: She is alert and oriented to person, place, and time. No cranial nerve deficit.  Skin: Skin is warm and dry. No rash noted. She is Erin diaphoretic. No erythema. No pallor.  Psychiatric: She has a normal mood and affect. Her behavior is normal. Judgment and thought content normal.  Daughter says she gets angry and will only go to Dr. Raliegh Ip her PCP. Will Erin go to assisted living.. Lives alone and has some help.      Assessment/Plan:  SBO vs ileus Left inguinal hernia - Erin incarcerated Hx of prior SBO, hysterectomy, cholecystectomy and hernia repair Recurrent UTI/possible bladder prolapse Constipation and on Miralax at home Hx of DJD and confined to walking with walker Hx of falls Hypertension GERD  Plan:  She does Erin want an NG, her stomach is pretty full on CT, If she vomits again or has nausea she should have an NG.  I would treat the UTI and place on bowel rest, IV hydration.  Will recheck labs and film in AM. If she looks better and has a good night we can plan on SB protocol, pending findings in the  AM.   Earnstine Regal 09/28/2016, 2:00 PM

## 2016-09-28 NOTE — Progress Notes (Addendum)
Nurse paged hospitalist due to patients BP 110/47, P 58, O2 2LNC 97%  to communicate BP and P to provider prior to giving 2nd nitroglycerin. Per Burnadette PeterLynch, do not give second nitroglycerin, give 1mg  morphine. Burnadette PeterLynch is aware of EKG results and VS.

## 2016-09-28 NOTE — H&P (Signed)
History and Physical  Erin Gibson ZOX:[id number] DOB: Sep 10, 1919 [name]: 09/28/2016   PCP: Rogelia Boga, MD    Patient coming from: Home  Chief Complaint: Abdominal pain with nausea and vomiting  HPI:  Erin Gibson is a 83 y.o. female with medical history of CKD stage III, hypertension, multiple abdominal surgeries, and recurrent SBO who presents to the ED for evaluation of abdominal pain, nausea, and non-bloody vomiting. The patient says that she has had 2 weeks of abdominal pain, but it has worsened in the past 24 hours with numerous episodes of emesis on the night prior to admission. In addition, the patient had 3 loose stools on the evening of 09/27/2016. She complains of chronic dysuria. She states that she was last on antibiotics around November 2017 for a UTI. She denies any sick contacts. The patient describes her pain as severe and crampy and bandlike across the lower abdomen without any alleviating factors. She complains of some shortness of breath for the last several weeks which has been unchanged, but denies any chest discomfort, hemoptysis, coughing. The patient was recently discharged from the hospital after a stay from 08/05/2016 through 08/10/2016 at which time she was treated for small bowel obstruction. It resolved with medical management. She was also treated for UTI during that period of time.  In the emergency department, the patient was afebrile and hemodynamically stable production saturation 96-98% on room air. BMP and CBC were unremarkable. LFTs were unremarkable. Urinalysis is negative for pyuria but positive for nitrites. CT of the abdomen and pelvis showed mildly distended fluid-filled loops of small bowel with no transition point with concerns of enteritis versus partial small bowel obstruction. There was also liquid stool noted in the colon. Chest x-ray was negative for any acute findings  Assessment/Plan: Partial small bowel obstruction -As the patient  is passing flatus and having bowel movements, I feel this is not a complete obstruction -General surgery has been consulted -hold off on NG for now as pt has not vomited since admission -IV fluids -Antiemetics -npo  Vomiting and diarrhea -Check C. Difficile -GI pathogen panel -Influenza PCR  Hypertension -Hydralazine prn SBP >180 -Restart oral antihypertensive medications once able to tolerate po  CKD stage III -Baseline creatinine 1.0-1.1 -am BMP  Dysuria -Although the patient has no significant pyuria, the patient complains of significant dysuria and has nitrites in her urine -Unfortunately, urine culture was not sent and the patient has received ceftriaxone in the ED -plan short course abx  Bladder prolapse -pt describes prolaspse symptoms, but able to reduce it -likely a poor surgical candiate -monitor clinically, outpt followup  Depression/anxiety -Restart Klonopin and Zoloft once able to tolerate po      Past Medical History:  Diagnosis Date  . ANEMIA 03/31/2009  . Chronic kidney disease   . DEGENERATIVE JOINT DISEASE, GENERALIZED 05/06/2007  . Diverticulosis of sigmoid colon    Colonoscopy 2011  . DVT (deep venous thrombosis) (HCC)    hx of  . GERD 04/24/2007  . HYPERTENSION 04/24/2007  . VENOUS STASIS ULCER 07/20/2008   Past Surgical History:  Procedure Laterality Date  . ABDOMINAL HYSTERECTOMY     Completion hysterectomy  . CATARACT EXTRACTION    . CHOLECYSTECTOMY OPEN    . HERNIA REPAIR    . PARTIAL HYSTERECTOMY     Salpingo-oophorectomy as well  . TOTAL KNEE ARTHROPLASTY     x2   Social History:  reports that she has never smoked. She has never  used smokeless tobacco. She reports that she does not drink alcohol or use drugs.   Family History:  Family history reviewed.  No pertinent family history.   Allergies  Allergen Reactions  . Tramadol Other (See Comments)    Auditory and visual hallucinations  . Codeine Nausea And Vomiting     Violently ill  . Ibuprofen Nausea And Vomiting  . Diltiazem Hcl Other (See Comments)    Not sure about this reaction     Prior to Admission medications   Medication Sig Start Date End Date Taking? Authorizing Provider  acetaminophen (TYLENOL) 325 MG tablet Take 2 tablets (650 mg total) by mouth every 4 (four) hours as needed for mild pain or moderate pain. Patient taking differently: Take 650 mg by mouth at bedtime.  01/02/16  Yes Penny Pia, MD  amLODipine (NORVASC) 2.5 MG tablet Take 1 tablet (2.5 mg total) by mouth daily. 05/22/16  Yes Gordy Savers, MD  aspirin EC 81 MG tablet Take 81 mg by mouth every morning.   Yes Historical Provider, MD  clonazePAM (KLONOPIN) 0.5 MG tablet Take 0.5 tablets (0.25 mg total) by mouth 3 (three) times daily as needed for anxiety. 09/05/16  Yes Gordy Savers, MD  Cyanocobalamin (VITAMIN B-12) 2500 MCG SUBL Place 2,500 mcg under the tongue every morning.    Yes Historical Provider, MD  furosemide (LASIX) 40 MG tablet TAKE 1/2 TABLET (=20MG )    DAILY Patient taking differently: Take 20 mg by mouth daily. TAKE 1/2 TABLET (=20MG )    DAILY 05/22/16  Yes Gordy Savers, MD  isosorbide mononitrate (IMDUR) 30 MG 24 hr tablet Take 1 tablet (30 mg total) by mouth daily. 05/22/16  Yes Gordy Savers, MD  lisinopril (PRINIVIL,ZESTRIL) 20 MG tablet Take 1 tablet (20 mg total) by mouth daily. 08/02/16  Yes Gordy Savers, MD  Melatonin 3 MG TABS Take 3 mg by mouth at bedtime.   Yes Historical Provider, MD  Multiple Vitamins-Minerals (PRESERVISION AREDS 2) CAPS Take 1 capsule by mouth daily. 10/06/15  Yes Gordy Savers, MD  nitroGLYCERIN (NITROSTAT) 0.4 MG SL tablet Place 1 tablet (0.4 mg total) under the tongue every 5 (five) minutes as needed for chest pain. 04/20/16  Yes Gordy Savers, MD  Polyethyl Glycol-Propyl Glycol (SYSTANE) 0.4-0.3 % SOLN Place 2 drops into both eyes 2 (two) times daily.   Yes Historical Provider, MD  polyethylene  glycol powder (GLYCOLAX/MIRALAX) powder MIX 17GM IN LIQUID AND     DRINK TWO TIMES A DAY 10/06/15  Yes Gordy Savers, MD  sertraline (ZOLOFT) 25 MG tablet Take 1 tablet (25 mg total) by mouth daily. 08/02/16  Yes Gordy Savers, MD  cefdinir (OMNICEF) 300 MG capsule Take 1 capsule (300 mg total) by mouth daily. Patient not taking: Reported on 09/28/2016 08/09/16   Roddie Mc, FNP    Review of Systems:  Constitutional:  No weight loss, night sweats, Fevers, chills, fatigue.  Head&Eyes: No headache.  No vision loss.  No eye pain or scotoma ENT:  No Difficulty swallowing,Tooth/dental problems,Sore throat,  No ear ache, post nasal drip,  Cardio-vascular:  No chest pain, Orthopnea, PND, swelling in lower extremities,  dizziness, palpitations  GI:  No  hematochezia, melena, heartburn, indigestion, Resp:  .No wheezing.No chest wall deformity  Skin:  no rash or lesions.  GU:   No flank pain. Complains of dysuria Musculoskeletal:  No joint pain or swelling. No decreased range of motion. No back pain.  Psych:  No change in mood or affect.  Neurologic: No headache, no dysesthesia, no focal weakness, no vision loss. No syncope  Physical Exam: Vitals:   09/28/16 1014 09/28/16 1136 09/28/16 1200 09/28/16 1400  BP:  152/90 150/62 161/64  Pulse:  61 66 (!) 55  Resp:  19 18 19   Temp:      TempSrc:      SpO2:  94% 92% 98%  Weight: 48.5 kg (107 lb)     Height: 4\' 10"  (1.473 m)      General:  A&O x 3, NAD, nontoxic, pleasant/cooperative Head/Eye: No conjunctival hemorrhage, no icterus, [place]/AT, No nystagmus ENT:  No icterus,  No thrush, good dentition, no pharyngeal exudate Neck:  No masses, no lymphadenpathy, no bruits CV:  RRR, no rub, no gallop, no S3 Lung:  Fine bibasilar rales, good air movement, no wheeze, no rhonchi Abdomen: soft/diffuse tenderness, +BS, nondistended, no peritoneal signs; no rebound Ext: No cyanosis, No rashes, No petechiae, No lymphangitis, No  edema Neuro: CNII-XII intact, strength 4/5 in bilateral upper and lower extremities, no dysmetria  Labs on Admission:  Basic Metabolic Panel:  Recent Labs Lab 09/28/16 1034 09/28/16 1044  NA 141 145  K 3.6 3.7  CL 110 111  CO2 22  --   GLUCOSE 159* 156*  BUN 32* 31*  CREATININE 1.07* 1.10*  CALCIUM 9.0  --    Liver Function Tests:  Recent Labs Lab 09/28/16 1034  AST 19  ALT 13*  ALKPHOS 115  BILITOT 0.7  PROT 6.3*  ALBUMIN 3.5    Recent Labs Lab 09/28/16 1034  LIPASE 28   No results for input(s): AMMONIA in the last 168 hours. CBC:  Recent Labs Lab 09/28/16 1034 09/28/16 1044  WBC 6.6  --   NEUTROABS 6.3  --   HGB 12.2 13.3  HCT 37.9 39.0  MCV 87.5  --   PLT 233  --    Coagulation Profile: No results for input(s): INR, PROTIME in the last 168 hours. Cardiac Enzymes: No results for input(s): CKTOTAL, CKMB, CKMBINDEX, TROPONINI in the last 168 hours. BNP: Invalid input(s): POCBNP CBG: No results for input(s): GLUCAP in the last 168 hours. Urine analysis:    Component Value Date/Time   COLORURINE YELLOW 09/28/2016 1131   APPEARANCEUR HAZY (A) 09/28/2016 1131   LABSPEC 1.017 09/28/2016 1131   PHURINE 5.0 09/28/2016 1131   GLUCOSEU NEGATIVE 09/28/2016 1131   HGBUR SMALL (A) 09/28/2016 1131   BILIRUBINUR NEGATIVE 09/28/2016 1131   BILIRUBINUR negative 08/09/2016 1618   KETONESUR 5 (A) 09/28/2016 1131   PROTEINUR 30 (A) 09/28/2016 1131   UROBILINOGEN 0.2 08/09/2016 1618   UROBILINOGEN 0.2 07/21/2015 2225   NITRITE POSITIVE (A) 09/28/2016 1131   LEUKOCYTESUR TRACE (A) 09/28/2016 1131   Sepsis Labs: @LABRCNTIP (procalcitonin:4,lacticidven:4) )No results found for this or any previous visit (from the past 240 hour(s)).   Radiological Exams on Admission: Ct Abdomen Pelvis W Contrast  Result Date: 09/28/2016 CLINICAL DATA:  83 year old female with left lower quadrant abdominal pain, nausea and vomiting for the past 2 days EXAM: CT ABDOMEN AND  PELVIS WITH CONTRAST TECHNIQUE: Multidetector CT imaging of the abdomen and pelvis was performed using the standard protocol following bolus administration of intravenous contrast. CONTRAST:  80mL ISOVUE-300 IOPAMIDOL (ISOVUE-300) INJECTION 61% COMPARISON:  CT abdomen/ pelvis 06/05/2016 and 04/20/2010 FINDINGS: Lower chest: Greater than 6 year stability of a 6 mm right lower lobe pulmonary nodule dating back to July of 2011. Stability is consistent with a benign process.  A second small nodular opacity in the inferolateral aspect of the right lower lobe is also stable for more than 6 years and benign. Chronic elevation of the left hemidiaphragm with associated atelectasis. Calcifications of the aortic valve. The heart is normal in size. No pericardial effusion. Unremarkable distal thoracic esophagus. Hepatobiliary: Similar degree of biliary ductal dilatation over multiple prior studies dating back to 2011. No significant interval change. The distal common bile duct remains dilated at 1.3 cm prior to abruptly tapering at the level of the ampulla. New no visible choledocholithiasis or mass. Pancreas: Relative pancreatic atrophy in the body an neck with a visible but not particularly dilated pancreatic duct. Similar findings remain unchanged dating back to 2011. Spleen: Normal in size without focal abnormality. Adrenals/Urinary Tract: Stable 5.6 cm peripherally calcified cyst arising exophytic from the upper pole of the right kidney. Alternately, this could represent an exophytic cyst arising from hepatic segment 6 and abutting the kidney. In either event, there has been no interval change over multiple prior studies dating back to 2011. Mild renal parenchymal atrophy worse on the left than the right. There are several additional small circumscribed low-attenuation lesions which are too small to characterize but statistically highly likely benign cysts. No hydronephrosis, nephrolithiasis or enhancing mass.  Stomach/Bowel: Very mild distention of multiple loops of fluid-filled small bowel throughout the abdomen without true obstruction or transition point. Similarly, the colon is filled with liquid stool including the rectum. There may be mild hyperenhancement of the mucosal surface of the rectum without associated submucosal edema or thickening. No free air or free fluid. The left lower quadrant inguinal hernia contains trace fluid and a portion of the anterior wall of the sigmoid colon. This is a change compared to prior. Given the chronic presence of fluid an omental fat within the hernia sac, superimposed inflammatory changes difficult to assess. Vascular/Lymphatic: Mild fusiform aneurysmal dilatation of the infrarenal abdominal aorta with a maximal diameter of 3.2 cm. This is in significantly changed compared to prior. Scattered atherosclerotic vascular calcifications. 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 Reproductive: Surgically absent. Other: Left lower quadrant inguinal hernia containing herniated omental fat, fluid and the anterior wall of the adjacent sigmoid colon. Musculoskeletal: No acute or significant osseous findings. IMPRESSION: 1. Mildly distended and fluid-filled small bowel throughout the abdomen. No definite transition point. Differential considerations include viral enteritis versus partial small bowel obstruction. 2. Liquid stool noted throughout the colon. It is the patient also experiencing diarrhea? 3. Slight interval change in the appearance of the left lower quadrant inguinal hernia which now contains a portion of the anterior wall of the adjacent sigmoid colon. However, there is no associated inflammation or evidence of obstruction related to this mild change. 4. Fusiform infrarenal abdominal aortic aneurysm measures 3.2 cm, unchanged. Recommend followup by ultrasound in 3 years. This recommendation follows ACR consensus guidelines: White Paper of the ACR  Incidental Findings Committee II on Vascular Findings. J Am Coll Radiol 2013; 16:109-604 5. Numerous additional ancillary findings as above without significant interval change over multiple prior studies. Electronically Signed   By: Malachy Moan M.D.   On: 09/28/2016 13:06   Dg Chest Portable 1 View  Result Date: 09/28/2016 CLINICAL DATA:  Chest wall pain EXAM: PORTABLE CHEST 1 VIEW COMPARISON:  02/16/2016 FINDINGS: Cardiomediastinal silhouette is stable. Again noted thoracic dextroscoliosis. Stable mild ribcage asymmetry. Old right upper rib fractures are stable. Atherosclerotic calcifications of thoracic aorta. Extensive degenerative changes bilateral shoulders. IMPRESSION: No active disease.  Electronically Signed   By: Natasha MeadLiviu  Pop M.D.   On: 09/28/2016 10:33        Time spent:60 minutes Code Status:   DNR Family Communication:  Daughter update at bedside Disposition Plan: expect 2-3 day hospitalization Consults called: General Surgery  DVT Prophylaxis: [place] Lovenox  Abe Schools, DO  [facility] Pager [phone]  If 7PM-7AM, please contact night-coverage [email or web address] Password [email or web address] 09/28/2016, 4:21 PM

## 2016-09-28 NOTE — ED Triage Notes (Signed)
Per EMS patient has been having LLQ abd pain, n/v since Tuesday.  Patient c/o chest wall pain this am, was given Nitro 1 tablet by Home Health Rn this am.  Patient was vomiting all night.  Vitals 132/56, 62 with PVC, 98% on O2 2L via n/c.  Patient has 20g in Left AC, was given Zofran 4mg  in route and Normal Saline 100ml.  [facility] presenting with sam symptoms when she has had bowel obstruction.

## 2016-09-28 NOTE — ED Notes (Signed)
Dr Tat at bedside 

## 2016-09-28 NOTE — ED Provider Notes (Signed)
[facility]-EMERGENCY DEPT Provider Note   CSN: [id number] Arrival date & time: 09/28/16  1001     History   Chief Complaint Chief Complaint  Patient presents with  . Abdominal Pain  . Emesis  . Diarrhea    HPI Erin Gibson is a 83 y.o. female.  83 yo F with a chief complaints of abdominal pain and vomiting. Going on since last night. Has a history of small bowel obstructions in thickness. Not tolerating by mouth. Denies diarrhea. Denies flatus this morning.   The history is provided by the patient.  Abdominal Pain   This is a new problem. The current episode started less than 1 hour ago. The problem occurs constantly. The problem has not changed since onset.The pain is located in the epigastric region, RUQ and RLQ. The quality of the pain is sharp and shooting. The pain is at a severity of 7/10. The pain is moderate. Associated symptoms include diarrhea, nausea and vomiting. Pertinent negatives include fever, dysuria, headaches, arthralgias and myalgias. Nothing aggravates the symptoms. Nothing relieves the symptoms. Past workup includes surgery.  Emesis   Associated symptoms include abdominal pain and diarrhea. Pertinent negatives include no arthralgias, no chills, no fever, no headaches and no myalgias.  Diarrhea   Associated symptoms include abdominal pain and vomiting. Pertinent negatives include no chills, no headaches, no arthralgias and no myalgias.    Past Medical History:  Diagnosis Date  . ANEMIA 03/31/2009  . Chronic kidney disease   . DEGENERATIVE JOINT DISEASE, GENERALIZED 05/06/2007  . Diverticulosis of sigmoid colon    Colonoscopy 2011  . DVT (deep venous thrombosis) (HCC)    hx of  . GERD 04/24/2007  . HYPERTENSION 04/24/2007  . VENOUS STASIS ULCER 07/20/2008    Patient Active Problem List   Diagnosis Date Noted  . Small bowel obstruction 09/28/2016  . Protein-calorie malnutrition, severe 06/06/2016  . Moderate aortic stenosis 05/22/2016  . Chronic venous  insufficiency 04/20/2016  . Chest pain 01/01/2016  . Fall 12/25/2015  . Elevated troponin 12/25/2015  . Scalp laceration 12/25/2015  . Hyperkalemia 12/25/2015  . UTI (lower urinary tract infection) 12/25/2015  . Odynophagia 07/23/2015  . CKD (chronic kidney disease), stage III 07/23/2015  . Faintness   . UTI (urinary tract infection) 07/22/2015  . Abdominal pain 07/22/2015  . Nausea with vomiting 07/22/2015  . Syncope 07/21/2015  . Abnormality of gait 01/22/2014  . SBO (small bowel obstruction) (HCC) 03/09/2013  . Inguinal hernia, left - Contains sigmoid colon.  Reducible 03/09/2013  . Anemia 03/31/2009  . VENOUS STASIS ULCER 07/20/2008  . Osteoarthritis 05/06/2007  . Essential hypertension 04/24/2007  . GERD 04/24/2007    Past Surgical History:  Procedure Laterality Date  . ABDOMINAL HYSTERECTOMY     Completion hysterectomy  . CATARACT EXTRACTION    . CHOLECYSTECTOMY OPEN    . HERNIA REPAIR    . PARTIAL HYSTERECTOMY     Salpingo-oophorectomy as well  . TOTAL KNEE ARTHROPLASTY     x2    OB History    No data available       Home Medications    Prior to Admission medications   Medication Sig Start Date End Date Taking? Authorizing Provider  acetaminophen (TYLENOL) 325 MG tablet Take 2 tablets (650 mg total) by mouth every 4 (four) hours as needed for mild pain or moderate pain. Patient taking differently: Take 650 mg by mouth at bedtime.  01/02/16  Yes Penny Piarlando Vega, MD  amLODipine (NORVASC) 2.5 MG tablet Take  1 tablet (2.5 mg total) by mouth daily. 05/22/16  Yes Gordy Savers, MD  aspirin EC 81 MG tablet Take 81 mg by mouth every morning.   Yes Historical Provider, MD  clonazePAM (KLONOPIN) 0.5 MG tablet Take 0.5 tablets (0.25 mg total) by mouth 3 (three) times daily as needed for anxiety. 09/05/16  Yes Gordy Savers, MD  Cyanocobalamin (VITAMIN B-12) 2500 MCG SUBL Place 2,500 mcg under the tongue every morning.    Yes Historical Provider, MD  furosemide  (LASIX) 40 MG tablet TAKE 1/2 TABLET (=20MG )    DAILY Patient taking differently: Take 20 mg by mouth daily. TAKE 1/2 TABLET (=20MG )    DAILY 05/22/16  Yes Gordy Savers, MD  isosorbide mononitrate (IMDUR) 30 MG 24 hr tablet Take 1 tablet (30 mg total) by mouth daily. 05/22/16  Yes Gordy Savers, MD  lisinopril (PRINIVIL,ZESTRIL) 20 MG tablet Take 1 tablet (20 mg total) by mouth daily. 08/02/16  Yes Gordy Savers, MD  Melatonin 3 MG TABS Take 3 mg by mouth at bedtime.   Yes Historical Provider, MD  Multiple Vitamins-Minerals (PRESERVISION AREDS 2) CAPS Take 1 capsule by mouth daily. 10/06/15  Yes Gordy Savers, MD  nitroGLYCERIN (NITROSTAT) 0.4 MG SL tablet Place 1 tablet (0.4 mg total) under the tongue every 5 (five) minutes as needed for chest pain. 04/20/16  Yes Gordy Savers, MD  Polyethyl Glycol-Propyl Glycol (SYSTANE) 0.4-0.3 % SOLN Place 2 drops into both eyes 2 (two) times daily.   Yes Historical Provider, MD  polyethylene glycol powder (GLYCOLAX/MIRALAX) powder MIX 17GM IN LIQUID AND     DRINK TWO TIMES A DAY 10/06/15  Yes Gordy Savers, MD  sertraline (ZOLOFT) 25 MG tablet Take 1 tablet (25 mg total) by mouth daily. 08/02/16  Yes Gordy Savers, MD  cefdinir (OMNICEF) 300 MG capsule Take 1 capsule (300 mg total) by mouth daily. Patient not taking: Reported on 09/28/2016 08/09/16   Roddie Mc, FNP    Family History No family history on file.  Social History Social History  Substance Use Topics  . Smoking status: Never Smoker  . Smokeless tobacco: Never Used  . Alcohol use No     Allergies   Tramadol; Codeine; Ibuprofen; and Diltiazem hcl   Review of Systems Review of Systems  Constitutional: Negative for chills and fever.  HENT: Negative for congestion and rhinorrhea.   Eyes: Negative for redness and visual disturbance.  Respiratory: Negative for shortness of breath and wheezing.   Cardiovascular: Negative for chest pain and  palpitations.  Gastrointestinal: Positive for abdominal pain, diarrhea, nausea and vomiting.  Genitourinary: Negative for dysuria and urgency.  Musculoskeletal: Negative for arthralgias and myalgias.  Skin: Negative for pallor and wound.  Neurological: Negative for dizziness and headaches.     Physical Exam Updated Vital Signs BP 161/64   Pulse (!) 55   Temp 98.4 F (36.9 C) (Oral)   Resp 19   Ht 4\' 10"  (1.473 m)   Wt 107 lb (48.5 kg)   SpO2 98%   BMI 22.36 kg/m   Physical Exam  Constitutional: She is oriented to person, place, and time. She appears well-developed and well-nourished. No distress.  HENT:  Head: Normocephalic and atraumatic.  Eyes: EOM are normal. Pupils are equal, round, and reactive to light.  Neck: Normal range of motion. Neck supple.  Cardiovascular: Normal rate and regular rhythm.  Exam reveals no gallop and no friction rub.   No murmur heard. Pulmonary/Chest:  Effort normal. She has no wheezes. She has no rales.  Abdominal: Soft. She exhibits no distension and no mass. There is tenderness (worst to the epigastric to right of the umbilicus). There is no guarding.  Musculoskeletal: She exhibits no edema or tenderness.  Neurological: She is alert and oriented to person, place, and time.  Skin: Skin is warm and dry. She is not diaphoretic.  Psychiatric: She has a normal mood and affect. Her behavior is normal.  Nursing note and vitals reviewed.    ED Treatments / Results  Labs (all labs ordered are listed, but only abnormal results are displayed) Labs Reviewed  COMPREHENSIVE METABOLIC PANEL - Abnormal; Notable for the following:       Result Value   Glucose, Bld 159 (*)    BUN 32 (*)    Creatinine, Ser 1.07 (*)    Total Protein 6.3 (*)    ALT 13 (*)    GFR calc non Af Amer 43 (*)    GFR calc Af Amer 50 (*)    All other components within normal limits  CBC WITH DIFFERENTIAL/PLATELET - Abnormal; Notable for the following:    Lymphs Abs 0.2 (*)     All other components within normal limits  URINALYSIS, ROUTINE W REFLEX MICROSCOPIC - Abnormal; Notable for the following:    APPearance HAZY (*)    Hgb urine dipstick SMALL (*)    Ketones, ur 5 (*)    Protein, ur 30 (*)    Nitrite POSITIVE (*)    Leukocytes, UA TRACE (*)    Bacteria, UA MANY (*)    Squamous Epithelial / LPF 0-5 (*)    All other components within normal limits  I-STAT CHEM 8, ED - Abnormal; Notable for the following:    BUN 31 (*)    Creatinine, Ser 1.10 (*)    Glucose, Bld 156 (*)    All other components within normal limits  LIPASE, BLOOD  I-STAT CG4 LACTIC ACID, ED    EKG  EKG Interpretation None       Radiology Ct Abdomen Pelvis W Contrast  Result Date: 09/28/2016 CLINICAL DATA:  83 year old female with left lower quadrant abdominal pain, nausea and vomiting for the past 2 days EXAM: CT ABDOMEN AND PELVIS WITH CONTRAST TECHNIQUE: Multidetector CT imaging of the abdomen and pelvis was performed using the standard protocol following bolus administration of intravenous contrast. CONTRAST:  80mL ISOVUE-300 IOPAMIDOL (ISOVUE-300) INJECTION 61% COMPARISON:  CT abdomen/ pelvis 06/05/2016 and 04/20/2010 FINDINGS: Lower chest: Greater than 6 year stability of a 6 mm right lower lobe pulmonary nodule dating back to July of 2011. Stability is consistent with a benign process. A second small nodular opacity in the inferolateral aspect of the right lower lobe is also stable for more than 6 years and benign. Chronic elevation of the left hemidiaphragm with associated atelectasis. Calcifications of the aortic valve. The heart is normal in size. No pericardial effusion. Unremarkable distal thoracic esophagus. Hepatobiliary: Similar degree of biliary ductal dilatation over multiple prior studies dating back to 2011. No significant interval change. The distal common bile duct remains dilated at 1.3 cm prior to abruptly tapering at the level of the ampulla. New no visible  choledocholithiasis or mass. Pancreas: Relative pancreatic atrophy in the body an neck with a visible but not particularly dilated pancreatic duct. Similar findings remain unchanged dating back to 2011. Spleen: Normal in size without focal abnormality. Adrenals/Urinary Tract: Stable 5.6 cm peripherally calcified cyst arising exophytic from the upper  pole of the right kidney. Alternately, this could represent an exophytic cyst arising from hepatic segment 6 and abutting the kidney. In either event, there has been no interval change over multiple prior studies dating back to 2011. Mild renal parenchymal atrophy worse on the left than the right. There are several additional small circumscribed low-attenuation lesions which are too small to characterize but statistically highly likely benign cysts. No hydronephrosis, nephrolithiasis or enhancing mass. Stomach/Bowel: Very mild distention of multiple loops of fluid-filled small bowel throughout the abdomen without true obstruction or transition point. Similarly, the colon is filled with liquid stool including the rectum. There may be mild hyperenhancement of the mucosal surface of the rectum without associated submucosal edema or thickening. No free air or free fluid. The left lower quadrant inguinal hernia contains trace fluid and a portion of the anterior wall of the sigmoid colon. This is a change compared to prior. Given the chronic presence of fluid an omental fat within the hernia sac, superimposed inflammatory changes difficult to assess. Vascular/Lymphatic: Mild fusiform aneurysmal dilatation of the infrarenal abdominal aorta with a maximal diameter of 3.2 cm. This is in significantly changed compared to prior. Scattered atherosclerotic vascular calcifications. 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 Reproductive: Surgically absent. Other: Left lower quadrant inguinal hernia containing herniated omental fat, fluid and the anterior wall of  the adjacent sigmoid colon. Musculoskeletal: No acute or significant osseous findings. IMPRESSION: 1. Mildly distended and fluid-filled small bowel throughout the abdomen. No definite transition point. Differential considerations include viral enteritis versus partial small bowel obstruction. 2. Liquid stool noted throughout the colon. It is the patient also experiencing diarrhea? 3. Slight interval change in the appearance of the left lower quadrant inguinal hernia which now contains a portion of the anterior wall of the adjacent sigmoid colon. However, there is no associated inflammation or evidence of obstruction related to this mild change. 4. Fusiform infrarenal abdominal aortic aneurysm measures 3.2 cm, unchanged. Recommend followup by ultrasound in 3 years. This recommendation follows ACR consensus guidelines: White Paper of the ACR Incidental Findings Committee II on Vascular Findings. J Am Coll Radiol 2013; 16:109-604 5. Numerous additional ancillary findings as above without significant interval change over multiple prior studies. Electronically Signed   By: Malachy Moan M.D.   On: 09/28/2016 13:06   Dg Chest Portable 1 View  Result Date: 09/28/2016 CLINICAL DATA:  Chest wall pain EXAM: PORTABLE CHEST 1 VIEW COMPARISON:  02/16/2016 FINDINGS: Cardiomediastinal silhouette is stable. Again noted thoracic dextroscoliosis. Stable mild ribcage asymmetry. Old right upper rib fractures are stable. Atherosclerotic calcifications of thoracic aorta. Extensive degenerative changes bilateral shoulders. IMPRESSION: No active disease. Electronically Signed   By: Natasha Mead M.D.   On: 09/28/2016 10:33    Procedures Procedures (including critical care time)  Medications Ordered in ED Medications  cefTRIAXone (ROCEPHIN) 1 g in dextrose 5 % 50 mL IVPB (1 g Intravenous New Bag/Given 09/28/16 1417)  acetaminophen (OFIRMEV) IV 1,000 mg (not administered)  sodium chloride 0.9 % bolus 1,000 mL (0 mLs Intravenous  Stopped 09/28/16 1216)  morphine 2 MG/ML injection 2 mg (2 mg Intravenous Given 09/28/16 1050)  ondansetron (ZOFRAN) injection 4 mg (4 mg Intravenous Given 09/28/16 1049)  iopamidol (ISOVUE-300) 61 % injection 30 mL (30 mLs Oral Contrast Given 09/28/16 1039)  iopamidol (ISOVUE-300) 61 % injection 80 mL (80 mLs Intravenous Contrast Given 09/28/16 1233)  ondansetron (ZOFRAN) injection 4 mg (4 mg Intravenous Given 09/28/16 1404)     Initial  Impression / Assessment and Plan / ED Course  I have reviewed the triage vital signs and the nursing notes.  Pertinent labs & imaging results that were available during my care of the patient were reviewed by me and considered in my medical decision making (see chart for details).  Clinical Course     83 yo F With a chief complaints of abdominal pain and vomiting. History of small bowel obstruction in the past. CT scan consistent with the same. Will admit for IV fluids. Discussed with general surgery who will come evaluate the patient.  The patients results and plan were reviewed and discussed.   Any x-rays performed were independently reviewed by myself.   Differential diagnosis were considered with the presenting HPI.  Medications  cefTRIAXone (ROCEPHIN) 1 g in dextrose 5 % 50 mL IVPB (1 g Intravenous New Bag/Given 09/28/16 1417)  acetaminophen (OFIRMEV) IV 1,000 mg (not administered)  sodium chloride 0.9 % bolus 1,000 mL (0 mLs Intravenous Stopped 09/28/16 1216)  morphine 2 MG/ML injection 2 mg (2 mg Intravenous Given 09/28/16 1050)  ondansetron (ZOFRAN) injection 4 mg (4 mg Intravenous Given 09/28/16 1049)  iopamidol (ISOVUE-300) 61 % injection 30 mL (30 mLs Oral Contrast Given 09/28/16 1039)  iopamidol (ISOVUE-300) 61 % injection 80 mL (80 mLs Intravenous Contrast Given 09/28/16 1233)  ondansetron (ZOFRAN) injection 4 mg (4 mg Intravenous Given 09/28/16 1404)    Vitals:   09/28/16 1014 09/28/16 1136 09/28/16 1200 09/28/16 1400  BP:  152/90 150/62 161/64  Pulse:  61  66 (!) 55  Resp:  19 18 19   Temp:      TempSrc:      SpO2:  94% 92% 98%  Weight: 107 lb (48.5 kg)     Height: 4\' 10"  (1.473 m)       Final diagnoses:  SBO (small bowel obstruction)    Admission/ observation were discussed with the admitting physician, patient and/or family and they are comfortable with the plan.    Final Clinical Impressions(s) / ED Diagnoses   Final diagnoses:  SBO (small bowel obstruction)    New Prescriptions New Prescriptions   No medications on file     Melene Plan, DO 09/28/16 1433

## 2016-09-28 NOTE — Progress Notes (Signed)
Nurse paged hospitalist due to patient complaining of chest pain at level 5, described as pressure. Patients vital signs as follows: BP 136/46, P 62, R 20, O2 on RA 90%, T 98.1. Per hospitalist, Lynch, get EKG, place patient on oxygen, and give nitroglycerin. Patient is on oxygen via nasal cannula at 2L/min. Nurse is going to give nitroglycerin now.

## 2016-09-28 NOTE — ED Notes (Signed)
Surgical PA at bedside  

## 2016-09-28 NOTE — ED Notes (Signed)
Bed: WA03 Expected date:  Expected time:  Means of arrival:  Comments: EMS 

## 2016-09-29 ENCOUNTER — Inpatient Hospital Stay (HOSPITAL_COMMUNITY): Payer: Medicare Other

## 2016-09-29 DIAGNOSIS — A0472 Enterocolitis due to Clostridium difficile, not specified as recurrent: Secondary | ICD-10-CM

## 2016-09-29 LAB — GASTROINTESTINAL PANEL BY PCR, STOOL (REPLACES STOOL CULTURE)
ADENOVIRUS F40/41: NOT DETECTED
Astrovirus: NOT DETECTED
CRYPTOSPORIDIUM: NOT DETECTED
Campylobacter species: NOT DETECTED
Cyclospora cayetanensis: NOT DETECTED
ENTEROPATHOGENIC E COLI (EPEC): NOT DETECTED
ENTEROTOXIGENIC E COLI (ETEC): NOT DETECTED
Entamoeba histolytica: NOT DETECTED
Enteroaggregative E coli (EAEC): NOT DETECTED
Giardia lamblia: NOT DETECTED
Norovirus GI/GII: DETECTED — AB
PLESIMONAS SHIGELLOIDES: NOT DETECTED
Rotavirus A: NOT DETECTED
SHIGA LIKE TOXIN PRODUCING E COLI (STEC): NOT DETECTED
Salmonella species: NOT DETECTED
Sapovirus (I, II, IV, and V): NOT DETECTED
Shigella/Enteroinvasive E coli (EIEC): NOT DETECTED
Vibrio cholerae: NOT DETECTED
Vibrio species: NOT DETECTED
YERSINIA ENTEROCOLITICA: NOT DETECTED

## 2016-09-29 LAB — CBC
HCT: 32.4 % — ABNORMAL LOW (ref 36.0–46.0)
HEMOGLOBIN: 10.4 g/dL — AB (ref 12.0–15.0)
MCH: 28.7 pg (ref 26.0–34.0)
MCHC: 32.1 g/dL (ref 30.0–36.0)
MCV: 89.3 fL (ref 78.0–100.0)
Platelets: 179 10*3/uL (ref 150–400)
RBC: 3.63 MIL/uL — ABNORMAL LOW (ref 3.87–5.11)
RDW: 14.8 % (ref 11.5–15.5)
WBC: 3.4 10*3/uL — ABNORMAL LOW (ref 4.0–10.5)

## 2016-09-29 LAB — BASIC METABOLIC PANEL
Anion gap: 6 (ref 5–15)
BUN: 27 mg/dL — AB (ref 6–20)
CO2: 21 mmol/L — ABNORMAL LOW (ref 22–32)
Calcium: 8.3 mg/dL — ABNORMAL LOW (ref 8.9–10.3)
Chloride: 114 mmol/L — ABNORMAL HIGH (ref 101–111)
Creatinine, Ser: 1.03 mg/dL — ABNORMAL HIGH (ref 0.44–1.00)
GFR calc Af Amer: 52 mL/min — ABNORMAL LOW (ref >60)
GFR calc non Af Amer: 45 mL/min — ABNORMAL LOW (ref >60)
GLUCOSE: 79 mg/dL (ref 65–99)
Potassium: 3.5 mmol/L (ref 3.5–5.1)
Sodium: 141 mmol/L (ref 135–145)

## 2016-09-29 LAB — C DIFFICILE QUICK SCREEN W PCR REFLEX
C Diff antigen: POSITIVE — AB
C Diff toxin: NEGATIVE

## 2016-09-29 LAB — TROPONIN I: Troponin I: <0.03 ng/mL (ref <0.03)

## 2016-09-29 LAB — CLOSTRIDIUM DIFFICILE BY PCR: Toxigenic C. Difficile by PCR: POSITIVE — AB

## 2016-09-29 MED ORDER — LIP MEDEX EX OINT
TOPICAL_OINTMENT | CUTANEOUS | Status: AC
  Administered 2016-09-29: 12:00:00
  Filled 2016-09-29: qty 7

## 2016-09-29 MED ORDER — VANCOMYCIN 50 MG/ML ORAL SOLUTION
250.0000 mg | Freq: Four times a day (QID) | ORAL | Status: DC
  Administered 2016-09-29 – 2016-09-30 (×5): 250 mg via ORAL
  Filled 2016-09-29 (×9): qty 5

## 2016-09-29 NOTE — Progress Notes (Addendum)
PROGRESS NOTE  Erin Gibson:[id number] DOB: 07/03/1919 [name]: 09/28/2016 PCP: [name],[name], MD  HPI/Recap of past 24 hours:  Patient is seen with personal care giver and RN in room She is feeling better, no more n/v. She report dysuria and loose stool , ab pain has much improved but not completely resolved. No fever.  Assessment/Plan: Active Problems:   Essential hypertension   SBO (small bowel obstruction) (HCC)   UTI (urinary tract infection)   Nausea with vomiting   CKD (chronic kidney disease), stage III   Protein-calorie malnutrition, severe   Small bowel obstruction   Partial small bowel obstruction -with h/o recurrent sbo/partial sbo -patient did not require ng suction, now her symptom is resolving, less bdominal pain, no n/v, she is having diarrhea -continue ivf, diet advancement per general surgery  Vomiting and diarrhea -c diff ( antigen +, toxin negative), but + symptom, abdominal pain, diarrhea, she was recently on abx -will treat c diff with oral vanc -GI pathogen panel pending -Influenza PCR negative  Addendum at 4pm on 1/6: gi pcr + noravirus, patient informed, continue supportive care.  Recurrent UTI/dysuria: Patient report chronic dysuria,  ua suggest uti, urine culture pending Currently on rocephin  Bladder prolapse -pt describes prolaspse symptoms, but able to reduce it -likely a poor surgical candiate -monitor clinically, outpt followup   Hypertension -bp stable for now -Restart oral antihypertensive medications ( norvasc, imdur, lisinopril) once able to tolerate po and bp start to elevate  CKD stage III -Baseline creatinine 1.0-1.1 -cr close to baseline   Depression/anxiety -Restart Klonopin and Zoloft once able to tolerate po Currently very pleasant  H/o recurrent falls, walks with a walker, lives by herself, has personal care giver coming in day time, she report she is working on getting 24/7 care.  Physical  therapy ordered, likely will benefit from home health   Code Status:   DNR Family Communication: patient , personal care giver and friend  at bedside Disposition Plan: expect 2-3 day hospitalization Consults called: General Surgery  DVT Prophylaxis: [place] Lovenox  Procedures:  None   Antibiotics:  Rocephin from admission for uti  Oral vanc started on 1/6 for c diff ( antigen +, toxin negative, but + symptom, abdominal pain, diarrhea)   Objective: BP (!) 148/49 (BP Location: Right Arm)   Pulse 67   Temp 98.4 F (36.9 C) (Oral)   Resp 20   Ht 4\' 10"  (1.473 m)   Wt 48.5 kg (107 lb)   SpO2 96%   BMI 22.36 kg/m   Intake/Output Summary (Last 24 hours) at 09/29/16 1337 Last data filed at 09/29/16 0900  Gross per 24 hour  Intake          1056.25 ml  Output                0 ml  Net          1056.25 ml   Filed Weights   09/28/16 1014  Weight: 48.5 kg (107 lb)    Exam:   General:  pleasant  Cardiovascular: RRR  Respiratory: CTABL  Abdomen: Soft/ND/NT, positive BS  Musculoskeletal: No Edema  Neuro: aaox3  Data Reviewed: Basic Metabolic Panel:  Recent Labs Lab 09/28/16 1034 09/28/16 1044 09/29/16 0312  NA 141 145 141  K 3.6 3.7 3.5  CL 110 111 114*  CO2 22  --  21*  GLUCOSE 159* 156* 79  BUN 32* 31* 27*  CREATININE 1.07* 1.10* 1.03*  CALCIUM 9.0  --  8.3*   Liver Function Tests:  Recent Labs Lab 09/28/16 1034  AST 19  ALT 13*  ALKPHOS 115  BILITOT 0.7  PROT 6.3*  ALBUMIN 3.5    Recent Labs Lab 09/28/16 1034 09/28/16 1654  LIPASE 28 17   No results for input(s): AMMONIA in the last 168 hours. CBC:  Recent Labs Lab 09/28/16 1034 09/28/16 1044 09/29/16 0312  WBC 6.6  --  3.4*  NEUTROABS 6.3  --   --   HGB 12.2 13.3 10.4*  HCT 37.9 39.0 32.4*  MCV 87.5  --  89.3  PLT 233  --  179   Cardiac Enzymes:    Recent Labs Lab 09/28/16 2100 09/29/16 0312 09/29/16 0927  TROPONINI <0.03 <0.03 <0.03   BNP (last 3  results)  Recent Labs  02/16/16 1625  BNP 280.7*    ProBNP (last 3 results)  Recent Labs  02/16/16 1346  PROBNP 312.0*    CBG: No results for input(s): GLUCAP in the last 168 hours.  Recent Results (from the past 240 hour(s))  C difficile quick scan w PCR reflex     Status: Abnormal   Collection Time: 09/28/16  3:20 PM  Result Value Ref Range Status   C Diff antigen POSITIVE (A) NEGATIVE Final   C Diff toxin NEGATIVE NEGATIVE Final   C Diff interpretation Results are indeterminate. See PCR results.  Final  Clostridium Difficile by PCR     Status: Abnormal   Collection Time: 09/28/16  3:20 PM  Result Value Ref Range Status   Toxigenic C Difficile by pcr POSITIVE (A) NEGATIVE Final    Comment: Positive for toxigenic C. difficile with little to no toxin production. Only treat if clinical presentation suggests symptomatic illness.     Studies: Dg Abd 2 Views  Result Date: 09/29/2016 CLINICAL DATA:  Diarrhea all night.  Diffuse abdominal discomfort. EXAM: ABDOMEN - 2 VIEW COMPARISON:  CT from yesterday FINDINGS: Nonobstructive bowel gas pattern. Oral contrast administered yesterday has reached the left colon. Contrast also seen in the urinary bladder. Peripherally calcified right renal cyst as described on previous CT. Few colonic and small bowel fluid levels without over distention to suggest obstruction. No evidence of pneumoperitoneum. Advanced lumbar disc degeneration and levoscoliosis. IMPRESSION: 1. Nonobstructive bowel gas pattern. Oral contrast from yesterday has reached the colon. 2. Scattered bowel fluid levels correlating with diarrhea history. Electronically Signed   By: Marnee Spring M.D.   On: 09/29/2016 09:36    Scheduled Meds: . cefTRIAXone (ROCEPHIN)  IV  1 g Intravenous Q24H  . enoxaparin (LOVENOX) injection  30 mg Subcutaneous Q24H  .  morphine injection  1 mg Intravenous Once  . vancomycin  250 mg Oral Q6H    Continuous Infusions: . sodium chloride 0.9 %  1,000 mL with potassium chloride 20 mEq infusion 75 mL/hr at 09/29/16 1610     Time spent:  Orlondo Holycross MD, PhD  [facility] Pager [phone]. If 7PM-7AM, please contact night-coverage at [email or web address], password [facility] 09/29/2016, 1:37 PM  LOS: 1 day

## 2016-09-29 NOTE — Progress Notes (Signed)
General Surgery [facility]  Assessment & Plan:  Small bowel obstruction vs ileus  Patient comfortable overnight, slept well, no nausea or emesis  Diarrheal stools  AXR pending this AM  Will follow - no plans for operative intervention at this time        Erin Heckler, MD, [facility]       Office: [phone]    Subjective: Patient awakens from sleep, comfortable, does not want surgery  Objective: Vital signs in last 24 hours: Temp:  [98 F (36.7 C)-98.4 F (36.9 C)] 98.4 F (36.9 C) (01/06 0620) Pulse Rate:  [55-69] 67 (01/06 0620) Resp:  [16-20] 20 (01/06 0620) BP: (110-161)/(46-90) 148/49 (01/06 0620) SpO2:  [90 %-98 %] 96 % (01/06 0620) Weight:  [48.5 kg (107 lb)] 48.5 kg (107 lb) (01/05 1014) Last BM Date: 09/28/16  Intake/Output from previous day: 01/05 0701 - 01/06 0700 In: 1056.3 [I.V.:956.3; IV Piggyback:100] Out: 0  Intake/Output this shift: No intake/output data recorded.  Physical Exam: HEENT - sclerae clear, mucous membranes moist Neck - soft Abdomen - soft without distension; mild diffuse tenderness; no mass; well healed midline incision Ext - no edema, non-tender Neuro - alert & oriented, no focal deficits  Lab Results:   Recent Labs  09/28/16 1034 09/28/16 1044 09/29/16 0312  WBC 6.6  --  3.4*  HGB 12.2 13.3 10.4*  HCT 37.9 39.0 32.4*  PLT 233  --  179   BMET  Recent Labs  09/28/16 1034 09/28/16 1044 09/29/16 0312  NA 141 145 141  K 3.6 3.7 3.5  CL 110 111 114*  CO2 22  --  21*  GLUCOSE 159* 156* 79  BUN 32* 31* 27*  CREATININE 1.07* 1.10* 1.03*  CALCIUM 9.0  --  8.3*   PT/INR No results for input(s): LABPROT, INR in the last 72 hours. Comprehensive Metabolic Panel:    Component Value Date/Time   NA 141 09/29/2016 0312   NA 145 09/28/2016 1044   K 3.5 09/29/2016 0312   K 3.7 09/28/2016 1044   CL 114 (H) 09/29/2016 0312   CL 111 09/28/2016 1044   CO2 21 (L)  09/29/2016 0312   CO2 22 09/28/2016 1034   BUN 27 (H) 09/29/2016 0312   BUN 31 (H) 09/28/2016 1044   CREATININE 1.03 (H) 09/29/2016 0312   CREATININE 1.10 (H) 09/28/2016 1044   GLUCOSE 79 09/29/2016 0312   GLUCOSE 156 (H) 09/28/2016 1044   GLUCOSE 89 10/02/2006 1104   CALCIUM 8.3 (L) 09/29/2016 0312   CALCIUM 9.0 09/28/2016 1034   AST 19 09/28/2016 1034   AST 21 06/05/2016 2125   ALT 13 (L) 09/28/2016 1034   ALT 11 (L) 06/05/2016 2125   ALKPHOS 115 09/28/2016 1034   ALKPHOS 99 06/05/2016 2125   BILITOT 0.7 09/28/2016 1034   BILITOT 0.6 06/05/2016 2125   PROT 6.3 (L) 09/28/2016 1034   PROT 5.9 (L) 06/05/2016 2125   ALBUMIN 3.5 09/28/2016 1034   ALBUMIN 3.2 (L) 06/05/2016 2125    Studies/Results: Ct Abdomen Pelvis W Contrast  Result Date: 09/28/2016 CLINICAL DATA:  83 year old female with left lower quadrant abdominal pain, nausea and vomiting for the past 2 days EXAM: CT ABDOMEN AND PELVIS WITH CONTRAST TECHNIQUE: Multidetector CT imaging of the abdomen and pelvis was performed using the standard protocol following bolus administration of intravenous contrast. CONTRAST:  80mL ISOVUE-300 IOPAMIDOL (ISOVUE-300) INJECTION 61% COMPARISON:  CT abdomen/ pelvis  06/05/2016 and 04/20/2010 FINDINGS: Lower chest: Greater than 6 year stability of a 6 mm right lower lobe pulmonary nodule dating back to July of 2011. Stability is consistent with a benign process. A second small nodular opacity in the inferolateral aspect of the right lower lobe is also stable for more than 6 years and benign. Chronic elevation of the left hemidiaphragm with associated atelectasis. Calcifications of the aortic valve. The heart is normal in size. No pericardial effusion. Unremarkable distal thoracic esophagus. Hepatobiliary: Similar degree of biliary ductal dilatation over multiple prior studies dating back to 2011. No significant interval change. The distal common bile duct remains dilated at 1.3 cm prior to abruptly  tapering at the level of the ampulla. New no visible choledocholithiasis or mass. Pancreas: Relative pancreatic atrophy in the body an neck with a visible but not particularly dilated pancreatic duct. Similar findings remain unchanged dating back to 2011. Spleen: Normal in size without focal abnormality. Adrenals/Urinary Tract: Stable 5.6 cm peripherally calcified cyst arising exophytic from the upper pole of the right kidney. Alternately, this could represent an exophytic cyst arising from hepatic segment 6 and abutting the kidney. In either event, there has been no interval change over multiple prior studies dating back to 2011. Mild renal parenchymal atrophy worse on the left than the right. There are several additional small circumscribed low-attenuation lesions which are too small to characterize but statistically highly likely benign cysts. No hydronephrosis, nephrolithiasis or enhancing mass. Stomach/Bowel: Very mild distention of multiple loops of fluid-filled small bowel throughout the abdomen without true obstruction or transition point. Similarly, the colon is filled with liquid stool including the rectum. There may be mild hyperenhancement of the mucosal surface of the rectum without associated submucosal edema or thickening. No free air or free fluid. The left lower quadrant inguinal hernia contains trace fluid and a portion of the anterior wall of the sigmoid colon. This is a change compared to prior. Given the chronic presence of fluid an omental fat within the hernia sac, superimposed inflammatory changes difficult to assess. Vascular/Lymphatic: Mild fusiform aneurysmal dilatation of the infrarenal abdominal aorta with a maximal diameter of 3.2 cm. This is in significantly changed compared to prior. Scattered atherosclerotic vascular calcifications. 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 Reproductive: Surgically absent. Other: Left lower quadrant inguinal hernia containing  herniated omental fat, fluid and the anterior wall of the adjacent sigmoid colon. Musculoskeletal: No acute or significant osseous findings. IMPRESSION: 1. Mildly distended and fluid-filled small bowel throughout the abdomen. No definite transition point. Differential considerations include viral enteritis versus partial small bowel obstruction. 2. Liquid stool noted throughout the colon. It is the patient also experiencing diarrhea? 3. Slight interval change in the appearance of the left lower quadrant inguinal hernia which now contains a portion of the anterior wall of the adjacent sigmoid colon. However, there is no associated inflammation or evidence of obstruction related to this mild change. 4. Fusiform infrarenal abdominal aortic aneurysm measures 3.2 cm, unchanged. Recommend followup by ultrasound in 3 years. This recommendation follows ACR consensus guidelines: White Paper of the ACR Incidental Findings Committee II on Vascular Findings. J Am Coll Radiol 2013; 16:109-60410:789-794 5. Numerous additional ancillary findings as above without significant interval change over multiple prior studies. Electronically Signed   By: Malachy MoanHeath  McCullough M.D.   On: 09/28/2016 13:06   Dg Chest Portable 1 View  Result Date: 09/28/2016 CLINICAL DATA:  Chest wall pain EXAM: PORTABLE CHEST 1 VIEW COMPARISON:  02/16/2016 FINDINGS:  Cardiomediastinal silhouette is stable. Again noted thoracic dextroscoliosis. Stable mild ribcage asymmetry. Old right upper rib fractures are stable. Atherosclerotic calcifications of thoracic aorta. Extensive degenerative changes bilateral shoulders. IMPRESSION: No active disease. Electronically Signed   By: Natasha Mead M.D.   On: 09/28/2016 10:33      Sakshi Sermons M 09/29/2016  Patient ID: Erin Gibson, female   DOB: 02/07/1919, 83 y.o.   MRN: [id number]

## 2016-09-30 ENCOUNTER — Inpatient Hospital Stay (HOSPITAL_COMMUNITY): Payer: Medicare Other

## 2016-09-30 DIAGNOSIS — K567 Ileus, unspecified: Secondary | ICD-10-CM

## 2016-09-30 DIAGNOSIS — A0811 Acute gastroenteropathy due to Norwalk agent: Secondary | ICD-10-CM

## 2016-09-30 DIAGNOSIS — I272 Pulmonary hypertension, unspecified: Secondary | ICD-10-CM

## 2016-09-30 DIAGNOSIS — R748 Abnormal levels of other serum enzymes: Secondary | ICD-10-CM

## 2016-09-30 LAB — TROPONIN I
TROPONIN I: 0.03 ng/mL — AB (ref <0.03)
TROPONIN I: 0.03 ng/mL — AB (ref <0.03)
TROPONIN I: 0.31 ng/mL — AB (ref <0.03)
Troponin I: 0.06 ng/mL (ref <0.03)

## 2016-09-30 LAB — BASIC METABOLIC PANEL
Anion gap: 6 (ref 5–15)
BUN: 21 mg/dL — AB (ref 6–20)
CHLORIDE: 117 mmol/L — AB (ref 101–111)
CO2: 19 mmol/L — ABNORMAL LOW (ref 22–32)
Calcium: 8.8 mg/dL — ABNORMAL LOW (ref 8.9–10.3)
Creatinine, Ser: 1 mg/dL (ref 0.44–1.00)
GFR calc Af Amer: 54 mL/min — ABNORMAL LOW (ref >60)
GFR calc non Af Amer: 46 mL/min — ABNORMAL LOW (ref >60)
Glucose, Bld: 83 mg/dL (ref 65–99)
POTASSIUM: 4 mmol/L (ref 3.5–5.1)
SODIUM: 142 mmol/L (ref 135–145)

## 2016-09-30 LAB — CBC
HEMATOCRIT: 35.6 % — AB (ref 36.0–46.0)
HEMOGLOBIN: 11.4 g/dL — AB (ref 12.0–15.0)
MCH: 28.6 pg (ref 26.0–34.0)
MCHC: 32 g/dL (ref 30.0–36.0)
MCV: 89.4 fL (ref 78.0–100.0)
Platelets: 196 10*3/uL (ref 150–400)
RBC: 3.98 MIL/uL (ref 3.87–5.11)
RDW: 15.1 % (ref 11.5–15.5)
WBC: 3 10*3/uL — ABNORMAL LOW (ref 4.0–10.5)

## 2016-09-30 LAB — MAGNESIUM: MAGNESIUM: 1.7 mg/dL (ref 1.7–2.4)

## 2016-09-30 MED ORDER — HYDRALAZINE HCL 20 MG/ML IJ SOLN
5.0000 mg | Freq: Four times a day (QID) | INTRAMUSCULAR | Status: DC
  Administered 2016-09-30 – 2016-10-02 (×6): 5 mg via INTRAVENOUS
  Filled 2016-09-30 (×8): qty 1

## 2016-09-30 MED ORDER — METOPROLOL TARTRATE 5 MG/5ML IV SOLN
2.5000 mg | Freq: Three times a day (TID) | INTRAVENOUS | Status: DC
  Administered 2016-10-01 (×2): 2.5 mg via INTRAVENOUS
  Filled 2016-09-30 (×2): qty 5

## 2016-09-30 NOTE — Progress Notes (Signed)
Nurse paged hospitalist, Toniann FailKakrakandy, to make provider aware of patient complaining of chest pain last time patient got up to go to bedside commode. Patient reported pain to be at level 5. Nurse placed pateint on 2L O2 via nasal cannula. Patient explains pain will get better when she catches her breath. Patients vital signs as follows: T 98.1, P 68, R 26, BP 182/75, O2 97% 2LNC. Repeat BP 172/67, P 69. Patient is currently resting. Toniann FailKakrakandy returned nurses page. Kakrakandy aware of patients status. Kakrakandy ordered chest xray and troponin.

## 2016-09-30 NOTE — Progress Notes (Signed)
Nurse paged hospitalist, Toniann FailKakrakandy, to make him aware of lab calling concerning critical troponin 0.03. Toniann FailKakrakandy returned nurses call and is aware of troponin results. Toniann FailKakrakandy gave nurse verbal order to draw troponin levels every six hours x 3. Nurse will enter verbal order.

## 2016-09-30 NOTE — Evaluation (Signed)
Physical Therapy Evaluation Patient Details Name: Dawayne CirriViolet W Pankonin MRN: [id number] DOB: 04/05/1919 Today's Date: 09/30/2016   History of Present Illness  83 yo female admitted with SBO, N/V/D, (+) norovirus per MD progress note 1/6. Hx of HTN, CKD, recurrent SBO, arthritis, anemia.   Clinical Impression  On eval, pt required Mod assist for mobility. She walked ~15'x2, [facility] Pt is weak and fatigues easily. Discussed d/c plan-pt declines placement; prefers to d/c home. Encouraged pt to talk with her daughter about arranging 24 hour care. Presently, pt is not safe to d/c home alone.     Follow Up Recommendations SNF;Supervision/Assistance - 24 hour (pt declines placement however, presently, she will need 24 hour care if she returns home)    Equipment Recommendations  None recommended by PT    Recommendations for Other Services OT consult     Precautions / Restrictions Precautions Precautions: Fall Precaution Comments: enteric Restrictions Weight Bearing Restrictions: No      Mobility  Bed Mobility Overal bed mobility: Needs Assistance Bed Mobility: Sit to Supine       Sit to supine: Min guard   General bed mobility comments: close guard for safety. Assisted back to bed at pt's request.   Transfers Overall transfer level: Needs assistance Equipment used: Rolling walker (2 wheeled) Transfers: Sit to/from Stand Sit to Stand: Mod assist         General transfer comment: x2. Assist to rise, stabilize, control descent. Increased time to rise and descend.   Ambulation/Gait Ambulation/Gait assistance: Min assist Ambulation Distance (Feet): 15 Feet (x2) Assistive device: Rolling walker (2 wheeled) Gait Pattern/deviations: Step-through pattern;Decreased stride length;Decreased step length - right;Decreased step length - left;Trunk flexed     General Gait Details: Assist to stabilize throughout ambulation. Pt fatigues eaily.   Stairs            Wheelchair  Mobility    Modified Rankin (Stroke Patients Only)       Balance Overall balance assessment: Needs assistance;History of Falls         Standing balance support: Bilateral upper extremity supported Standing balance-Leahy Scale: Poor Standing balance comment: requires RW and external support                             Pertinent Vitals/Pain Pain Assessment: Faces Faces Pain Scale: Hurts even more Pain Location: generalized pain Pain Descriptors / Indicators: Sore Pain Intervention(s): Limited activity within patient's tolerance;Repositioned    Home Living Family/patient expects to be discharged to:: Private residence Living Arrangements: Alone Available Help at Discharge: Personal care attendant;Available PRN/intermittently (4 hours/day) Type of Home: House Home Access: Stairs to enter   [employer] Corporation[employer]trance Stairs-Number of Steps: 1 Home Layout: One level Home Equipment: Walker - 2 wheels;Bedside commode      Prior Function Level of Independence: Needs assistance   Gait / Transfers Assistance Needed: ambulatory with RW  ADL's / Homemaking Assistance Needed: aides assist with homemaking  Comments: Pt reports multiple falls at home     Hand Dominance        Extremity/Trunk Assessment   Upper Extremity Assessment Upper Extremity Assessment: Generalized weakness    Lower Extremity Assessment Lower Extremity Assessment: Generalized weakness    Cervical / Trunk Assessment Cervical / Trunk Assessment: Kyphotic  Communication   Communication: No difficulties  Cognition Arousal/Alertness: Awake/alert Behavior During Therapy: WFL for tasks assessed/performed Overall Cognitive Status: Within Functional Limits for tasks assessed  General Comments      Exercises     Assessment/Plan    PT Assessment Patient needs continued PT services  PT Problem List Decreased strength;Decreased mobility;Decreased activity tolerance;Decreased  balance;Decreased knowledge of use of DME;Pain          PT Treatment Interventions DME instruction;Gait training;Therapeutic activities;Therapeutic exercise;Patient/family education;Functional mobility training;Balance training;Stair training    PT Goals (Current goals can be found in the Care Plan section)  Acute Rehab PT Goals Patient Stated Goal: to feel better. to go home at discharge PT Goal Formulation: With patient Time For Goal Achievement: 10/14/16 Potential to Achieve Goals: Good    Frequency Min 3X/week   Barriers to discharge        Co-evaluation               End of Session Equipment Utilized During Treatment: Gait belt Activity Tolerance: Patient limited by fatigue Patient left: in bed;with call bell/phone within reach;with bed alarm set           Time: 1021-1046 PT Time Calculation (min) (ACUTE ONLY): 25 min   Charges:   PT Evaluation $PT Eval Low Complexity: 1 Procedure PT Treatments $Gait Training: 8-22 mins   PT G Codes:        [name], MPT Pager: [phone]

## 2016-09-30 NOTE — Progress Notes (Signed)
PROGRESS NOTE    Erin Gibson  JYN:[id number] DOB: 08/18/1919 [name]: 09/28/2016 PCP: [name],[name], MD     Brief Narrative:  83 y.o.WF PMHx CKD stage III, HTN, multiple abdominal surgeries, recurrent SBO  Presents to the ED for evaluation of abdominal pain, nausea, and non-bloody vomiting. The patient says that she has had 2 weeks of abdominal pain, but it has worsened in the past 24 hours with numerous episodes of emesis on the night prior to admission. In addition, the patient had 3 loose stools on the evening of 09/27/2016. She complains of chronic dysuria. She states that she was last on antibiotics around November 2017 for a UTI. She denies any sick contacts. The patient describes her pain as severe and crampy and bandlike across the lower abdomen without any alleviating factors. She complains of some shortness of breath for the last several weeks which has been unchanged, but denies any chest discomfort, hemoptysis, coughing. The patient was recently discharged from the hospital after a stay from 08/05/2016 through 08/10/2016 at which time she was treated for small bowel obstruction. It resolved with medical management. She was also treated for UTI during that period of time.  In the emergency department, the patient was afebrile and hemodynamically stable production saturation 96-98% on room air. BMP and CBC were unremarkable. LFTs were unremarkable. Urinalysis is negative for pyuria but positive for nitrites. CT of the abdomen and pelvis showed mildly distended fluid-filled loops of small bowel with no transition point with concerns of enteritis versus partial small bowel obstruction. There was also liquid stool noted in the colon. Chest x-ray was negative for any acute findings   Subjective: 1/7 A/O 4, positive increased abdominal distention positive increased abdominal pain   Assessment & Plan:   Active Problems:   Essential hypertension   SBO (small bowel obstruction)  (HCC)   UTI (urinary tract infection)   Nausea with vomiting   CKD (chronic kidney disease), stage III   Protein-calorie malnutrition, severe   Small bowel obstruction   Partial small bowel obstruction/ileus -1/7 KUB shows ileus. Patient symptomatic. -Place NG tube to continuous suction. -Nothing by mouth  Vomiting and diarrhea/positive Norovirus gastroenteritis -C. diff ( antigen +, toxin negative), not consistent with active C. difficile infection. DC antibiotic. -Vomiting abdominal pain and diarrhea most likely secondary to ileus, viral gastroenteritis.  -Influenza PCR negative  Recurrent UTI/Dysuria: -Patient report chronic dysuria,  -Urine culture pending complete 5 day course empiric antibiotic   Bladder prolapse -pt describes prolaspse symptoms, but able to reduce it -likely a poor surgical candiate -monitor clinically, outpt followup  Elevated troponin -Most likely demand ischemia, will obtain updated echocardiogram. -Even if true MI unlikely candidate for intervention -Titrate O2 to maintain SPO2> 93% -Telemetry  Pulmonary hypertension   Hypertension - Hydralazine IV 5 mg  QID -Metoprolol IV 2.5 mg TID  CKD stage III(-Baseline creatinine 1.0-1.1) Lab Results  Component Value Date   CREATININE 1.00 09/30/2016   CREATININE 1.03 (H) 09/29/2016   CREATININE 1.10 (H) 09/28/2016  -Baseline  Depression/anxiety -Restart Klonopin and Zoloft once able to tolerate po  Recurrent falls, - walks with a walker, lives by herself, has personal care giver coming in day time, she report she is working on getting 24/7 care.  -Physical therapy ordered, likely will benefit from CIR vs SNF   DVT prophylaxis: Lovenox Code Status: DO NOT RESUSCITATE Family Communication: None Disposition Plan: Resolution ileus   Consultants:  None  Procedures/Significant Events:  1/5 CT abdomen pelvis W  contrast:-Mildly distended and fluid-filled small bowel throughout  the Abdomen.  -Liquid stool noted throughout the colon. - Slight interval change in the appearance of the left lower quadrant inguinal hernia which now contains a portion of the anterior wall of the adjacent sigmoid colon. However, there is no associated inflammation or evidence of obstruction related to this mild change. -Fusiform infrarenal abdominal aortic aneurysm measures 3.2 cm, unchanged.  1/7 ZOX:[id number] air-filled nondilated loops of bowel could represent mild ileus Echocardiogram pending    VENTILATOR SETTINGS:    Cultures 1/5 C. difficile positive antigen: Negative toxin 1/5 GI panel positive Norovirus 1/7 urine pending   Antimicrobials: Anti-infectives    Start     Stop   09/29/16 1400  cefTRIAXone (ROCEPHIN) 1 g in dextrose 5 % 50 mL IVPB         09/29/16 1400  vancomycin (VANCOCIN) 50 mg/mL oral solution 250 mg  Status:  Discontinued     09/30/16 2308   09/28/16 1345  cefTRIAXone (ROCEPHIN) 1 g in dextrose 5 % 50 mL IVPB     09/28/16 1449       Devices    LINES / TUBES:      Continuous Infusions: . sodium chloride 0.9 % 1,000 mL with potassium chloride 20 mEq infusion 75 mL/hr at 09/30/16 0514     Objective: Vitals:   09/29/16 2348 09/30/16 0500 09/30/16 0734 09/30/16 1432  BP: (!) 172/67 (!) 191/67 (!) 172/77 (!) 196/80  Pulse: 69 60 76 66  Resp:  20  15  Temp:  98.2 F (36.8 C)  98.4 F (36.9 C)  TempSrc:  Oral  Oral  SpO2:  99%  95%  Weight:      Height:        Intake/Output Summary (Last 24 hours) at 09/30/16 1528 Last data filed at 09/30/16 1433  Gross per 24 hour  Intake             1620 ml  Output                0 ml  Net             1620 ml   Filed Weights   09/28/16 1014  Weight: 48.5 kg (107 lb)    Examination:  General: A/O 4, positive abdominal pain similar to prior surgery, No acute respiratory distress Eyes: negative scleral hemorrhage, negative anisocoria, negative icterus ENT: Negative Runny nose, negative  gingival bleeding, Neck:  Negative scars, masses, torticollis, lymphadenopathy, JVD Lungs: Clear to auscultation bilaterally without wheezes or crackles Cardiovascular: Tachycardic, Regular rhythm without murmur gallop or rub normal S1 and S2 Abdomen: Positive abdominal pain greatest RLQ, to palpation, positive distention, firm,, hypoactive soft, bowel sounds, no rebound, no ascites, no appreciable mass Extremities: No significant cyanosis, clubbing, or edema bilateral lower extremities Skin: Negative rashes, lesions, ulcers Psychiatric:  Negative depression, negative anxiety, negative fatigue, negative mania  Central nervous system:  Cranial nerves II through XII intact, tongue/uvula midline, all extremities muscle strength 5/5, sensation intact throughout,  negative dysarthria, negative expressive aphasia, negative receptive aphasia.  .     Data Reviewed: Care during the described time interval was provided by me .  I have reviewed this patient's available data, including medical history, events of note, physical examination, and all test results as part of my evaluation. I have personally reviewed and interpreted all radiology studies.  CBC:  Recent Labs Lab 09/28/16 1034 09/28/16 1044 09/29/16 0312 09/30/16 0150  WBC 6.6  --  3.4* 3.0*  NEUTROABS 6.3  --   --   --   HGB 12.2 13.3 10.4* 11.4*  HCT 37.9 39.0 32.4* 35.6*  MCV 87.5  --  89.3 89.4  PLT 233  --  179 196   Basic Metabolic Panel:  Recent Labs Lab 09/28/16 1034 09/28/16 1044 09/29/16 0312 09/30/16 0150  NA 141 145 141 142  K 3.6 3.7 3.5 4.0  CL 110 111 114* 117*  CO2 22  --  21* 19*  GLUCOSE 159* 156* 79 83  BUN 32* 31* 27* 21*  CREATININE 1.07* 1.10* 1.03* 1.00  CALCIUM 9.0  --  8.3* 8.8*  MG  --   --   --  1.7   GFR: Estimated Creatinine Clearance: 21.7 mL/min (by C-G formula based on SCr of 1 mg/dL). Liver Function Tests:  Recent Labs Lab 09/28/16 1034  AST 19  ALT 13*  ALKPHOS 115  BILITOT  0.7  PROT 6.3*  ALBUMIN 3.5    Recent Labs Lab 09/28/16 1034 09/28/16 1654  LIPASE 28 17   No results for input(s): AMMONIA in the last 168 hours. Coagulation Profile: No results for input(s): INR, PROTIME in the last 168 hours. Cardiac Enzymes:  Recent Labs Lab 09/29/16 0312 09/29/16 0927 09/30/16 0150 09/30/16 0715 09/30/16 1403  TROPONINI <0.03 <0.03 0.03* 0.03* 0.06*   BNP (last 3 results)  Recent Labs  02/16/16 1346  PROBNP 312.0*   HbA1C: No results for input(s): HGBA1C in the last 72 hours. CBG: No results for input(s): GLUCAP in the last 168 hours. Lipid Profile: No results for input(s): CHOL, HDL, LDLCALC, TRIG, CHOLHDL, LDLDIRECT in the last 72 hours. Thyroid Function Tests: No results for input(s): TSH, T4TOTAL, FREET4, T3FREE, THYROIDAB in the last 72 hours. Anemia Panel: No results for input(s): VITAMINB12, FOLATE, FERRITIN, TIBC, IRON, RETICCTPCT in the last 72 hours. Sepsis Labs:  Recent Labs Lab 09/28/16 1044  LATICACIDVEN 0.81    Recent Results (from the past 240 hour(s))  Gastrointestinal Panel by PCR , Stool     Status: Abnormal   Collection Time: 09/28/16  3:20 PM  Result Value Ref Range Status   Campylobacter species NOT DETECTED NOT DETECTED Final   Plesimonas shigelloides NOT DETECTED NOT DETECTED Final   Salmonella species NOT DETECTED NOT DETECTED Final   Yersinia enterocolitica NOT DETECTED NOT DETECTED Final   Vibrio species NOT DETECTED NOT DETECTED Final   Vibrio cholerae NOT DETECTED NOT DETECTED Final   Enteroaggregative E coli (EAEC) NOT DETECTED NOT DETECTED Final   Enteropathogenic E coli (EPEC) NOT DETECTED NOT DETECTED Final   Enterotoxigenic E coli (ETEC) NOT DETECTED NOT DETECTED Final   Shiga like toxin producing E coli (STEC) NOT DETECTED NOT DETECTED Final   Shigella/Enteroinvasive E coli (EIEC) NOT DETECTED NOT DETECTED Final   Cryptosporidium NOT DETECTED NOT DETECTED Final   Cyclospora cayetanensis NOT  DETECTED NOT DETECTED Final   Entamoeba histolytica NOT DETECTED NOT DETECTED Final   Giardia lamblia NOT DETECTED NOT DETECTED Final   Adenovirus F40/41 NOT DETECTED NOT DETECTED Final   Astrovirus NOT DETECTED NOT DETECTED Final   Norovirus GI/GII DETECTED (A) NOT DETECTED Final    Comment: CRITICAL RESULT CALLED TO, READ BACK BY AND VERIFIED WITH: KATIE SILK AT 1548 ON 09/29/16 BY SNJ    Rotavirus A NOT DETECTED NOT DETECTED Final   Sapovirus (I, II, IV, and V) NOT DETECTED NOT DETECTED Final  C difficile quick scan w PCR reflex     Status:  Abnormal   Collection Time: 09/28/16  3:20 PM  Result Value Ref Range Status   C Diff antigen POSITIVE (A) NEGATIVE Final   C Diff toxin NEGATIVE NEGATIVE Final   C Diff interpretation Results are indeterminate. See PCR results.  Final  Clostridium Difficile by PCR     Status: Abnormal   Collection Time: 09/28/16  3:20 PM  Result Value Ref Range Status   Toxigenic C Difficile by pcr POSITIVE (A) NEGATIVE Final    Comment: Positive for toxigenic C. difficile with little to no toxin production. Only treat if clinical presentation suggests symptomatic illness.         Radiology Studies: Dg Chest Port 1 View  Result Date: 09/30/2016 CLINICAL DATA:  Intermittent upper chest pain with activity, accompanied by dyspnea. EXAM: PORTABLE CHEST 1 VIEW COMPARISON:  09/28/2016 FINDINGS: Shallow inspiration. Mild linear atelectatic appearing opacities in the bases, accentuated by the shallow degree of inspiration. No confluent consolidation. No large effusion. Normal pulmonary vasculature. IMPRESSION: Mild linear lung base opacities, accentuated by a shallow degree of inspiration. No consolidation or large effusion. Electronically Signed   By: Ellery Plunk M.D.   On: 09/30/2016 00:57   Dg Abd 2 Views  Result Date: 09/29/2016 CLINICAL DATA:  Diarrhea all night.  Diffuse abdominal discomfort. EXAM: ABDOMEN - 2 VIEW COMPARISON:  CT from yesterday FINDINGS:  Nonobstructive bowel gas pattern. Oral contrast administered yesterday has reached the left colon. Contrast also seen in the urinary bladder. Peripherally calcified right renal cyst as described on previous CT. Few colonic and small bowel fluid levels without over distention to suggest obstruction. No evidence of pneumoperitoneum. Advanced lumbar disc degeneration and levoscoliosis. IMPRESSION: 1. Nonobstructive bowel gas pattern. Oral contrast from yesterday has reached the colon. 2. Scattered bowel fluid levels correlating with diarrhea history. Electronically Signed   By: Marnee Spring M.D.   On: 09/29/2016 09:36        Scheduled Meds: . cefTRIAXone (ROCEPHIN)  IV  1 g Intravenous Q24H  . enoxaparin (LOVENOX) injection  30 mg Subcutaneous Q24H  .  morphine injection  1 mg Intravenous Once  . vancomycin  250 mg Oral Q6H   Continuous Infusions: . sodium chloride 0.9 % 1,000 mL with potassium chloride 20 mEq infusion 75 mL/hr at 09/30/16 0514     LOS: 2 days    Time spent:40 min    Kyisha Fowle, Roselind Messier, MD [facility] Pager [phone]  If 7PM-7AM, please contact night-coverage [email or web address] Password [email or web address] 09/30/2016, 3:28 PM

## 2016-09-30 NOTE — Progress Notes (Signed)
General Surgery [facility]  Assessment & Plan:  Small bowel obstruction vs ileus             Patient comfortable overnight, continued diarrheal stools             AXR yesterday with obstruction resolved, contrast to left colon             Will advance diet to soft mechanical  No plans for operative intervention - will sign off today        Velora Heckler, MD, [facility]       Office: [phone]    Subjective: Patient in bed, comfortable.  Complains of diarrheal stools.  Wants to eat scrambled eggs.  Objective: Vital signs in last 24 hours: Temp:  [98.1 F (36.7 C)-99.2 F (37.3 C)] 98.2 F (36.8 C) (01/07 0500) Pulse Rate:  [60-69] 60 (01/07 0500) Resp:  [18-26] 20 (01/07 0500) BP: (165-191)/(67-75) 191/67 (01/07 0500) SpO2:  [95 %-99 %] 99 % (01/07 0500) Last BM Date: 09/29/16  Intake/Output from previous day: 01/06 0701 - 01/07 0700 In: 1742.5 [P.O.:840; I.V.:852.5; IV Piggyback:50] Out: -  Intake/Output this shift: No intake/output data recorded.  Physical Exam: HEENT - sclerae clear, mucous membranes moist Neck - soft Abdomen - soft, minimally distended; mild diffuse tenderness; BS present Ext - no edema, non-tender Neuro - alert & oriented, no focal deficits  Lab Results:   Recent Labs  09/29/16 0312 09/30/16 0150  WBC 3.4* 3.0*  HGB 10.4* 11.4*  HCT 32.4* 35.6*  PLT 179 196   BMET  Recent Labs  09/29/16 0312 09/30/16 0150  NA 141 142  K 3.5 4.0  CL 114* 117*  CO2 21* 19*  GLUCOSE 79 83  BUN 27* 21*  CREATININE 1.03* 1.00  CALCIUM 8.3* 8.8*   PT/INR No results for input(s): LABPROT, INR in the last 72 hours. Comprehensive Metabolic Panel:    Component Value Date/Time   NA 142 09/30/2016 0150   NA 141 09/29/2016 0312   K 4.0 09/30/2016 0150   K 3.5 09/29/2016 0312   CL 117 (H) 09/30/2016 0150   CL 114 (H) 09/29/2016 0312   CO2 19 (L) 09/30/2016 0150   CO2 21 (L) 09/29/2016  0312   BUN 21 (H) 09/30/2016 0150   BUN 27 (H) 09/29/2016 0312   CREATININE 1.00 09/30/2016 0150   CREATININE 1.03 (H) 09/29/2016 0312   GLUCOSE 83 09/30/2016 0150   GLUCOSE 79 09/29/2016 0312   GLUCOSE 89 10/02/2006 1104   CALCIUM 8.8 (L) 09/30/2016 0150   CALCIUM 8.3 (L) 09/29/2016 0312   AST 19 09/28/2016 1034   AST 21 06/05/2016 2125   ALT 13 (L) 09/28/2016 1034   ALT 11 (L) 06/05/2016 2125   ALKPHOS 115 09/28/2016 1034   ALKPHOS 99 06/05/2016 2125   BILITOT 0.7 09/28/2016 1034   BILITOT 0.6 06/05/2016 2125   PROT 6.3 (L) 09/28/2016 1034   PROT 5.9 (L) 06/05/2016 2125   ALBUMIN 3.5 09/28/2016 1034   ALBUMIN 3.2 (L) 06/05/2016 2125    Studies/Results: Ct Abdomen Pelvis W Contrast  Result Date: 09/28/2016 CLINICAL DATA:  83 year old female with left lower quadrant abdominal pain, nausea and vomiting for the past 2 days EXAM: CT ABDOMEN AND PELVIS WITH CONTRAST TECHNIQUE: Multidetector CT imaging of the abdomen and pelvis was performed using the standard protocol following bolus administration of intravenous contrast. CONTRAST:  80mL ISOVUE-300 IOPAMIDOL (ISOVUE-300) INJECTION  61% COMPARISON:  CT abdomen/ pelvis 06/05/2016 and 04/20/2010 FINDINGS: Lower chest: Greater than 6 year stability of a 6 mm right lower lobe pulmonary nodule dating back to July of 2011. Stability is consistent with a benign process. A second small nodular opacity in the inferolateral aspect of the right lower lobe is also stable for more than 6 years and benign. Chronic elevation of the left hemidiaphragm with associated atelectasis. Calcifications of the aortic valve. The heart is normal in size. No pericardial effusion. Unremarkable distal thoracic esophagus. Hepatobiliary: Similar degree of biliary ductal dilatation over multiple prior studies dating back to 2011. No significant interval change. The distal common bile duct remains dilated at 1.3 cm prior to abruptly tapering at the level of the ampulla. New no  visible choledocholithiasis or mass. Pancreas: Relative pancreatic atrophy in the body an neck with a visible but not particularly dilated pancreatic duct. Similar findings remain unchanged dating back to 2011. Spleen: Normal in size without focal abnormality. Adrenals/Urinary Tract: Stable 5.6 cm peripherally calcified cyst arising exophytic from the upper pole of the right kidney. Alternately, this could represent an exophytic cyst arising from hepatic segment 6 and abutting the kidney. In either event, there has been no interval change over multiple prior studies dating back to 2011. Mild renal parenchymal atrophy worse on the left than the right. There are several additional small circumscribed low-attenuation lesions which are too small to characterize but statistically highly likely benign cysts. No hydronephrosis, nephrolithiasis or enhancing mass. Stomach/Bowel: Very mild distention of multiple loops of fluid-filled small bowel throughout the abdomen without true obstruction or transition point. Similarly, the colon is filled with liquid stool including the rectum. There may be mild hyperenhancement of the mucosal surface of the rectum without associated submucosal edema or thickening. No free air or free fluid. The left lower quadrant inguinal hernia contains trace fluid and a portion of the anterior wall of the sigmoid colon. This is a change compared to prior. Given the chronic presence of fluid an omental fat within the hernia sac, superimposed inflammatory changes difficult to assess. Vascular/Lymphatic: Mild fusiform aneurysmal dilatation of the infrarenal abdominal aorta with a maximal diameter of 3.2 cm. This is in significantly changed compared to prior. Scattered atherosclerotic vascular calcifications. 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 Reproductive: Surgically absent. Other: Left lower quadrant inguinal hernia containing herniated omental fat, fluid and the anterior  wall of the adjacent sigmoid colon. Musculoskeletal: No acute or significant osseous findings. IMPRESSION: 1. Mildly distended and fluid-filled small bowel throughout the abdomen. No definite transition point. Differential considerations include viral enteritis versus partial small bowel obstruction. 2. Liquid stool noted throughout the colon. It is the patient also experiencing diarrhea? 3. Slight interval change in the appearance of the left lower quadrant inguinal hernia which now contains a portion of the anterior wall of the adjacent sigmoid colon. However, there is no associated inflammation or evidence of obstruction related to this mild change. 4. Fusiform infrarenal abdominal aortic aneurysm measures 3.2 cm, unchanged. Recommend followup by ultrasound in 3 years. This recommendation follows ACR consensus guidelines: White Paper of the ACR Incidental Findings Committee II on Vascular Findings. J Am Coll Radiol 2013; 16:109-60410:789-794 5. Numerous additional ancillary findings as above without significant interval change over multiple prior studies. Electronically Signed   By: Malachy MoanHeath  McCullough M.D.   On: 09/28/2016 13:06   Dg Chest Port 1 View  Result Date: 09/30/2016 CLINICAL DATA:  Intermittent upper chest pain with activity,  accompanied by dyspnea. EXAM: PORTABLE CHEST 1 VIEW COMPARISON:  09/28/2016 FINDINGS: Shallow inspiration. Mild linear atelectatic appearing opacities in the bases, accentuated by the shallow degree of inspiration. No confluent consolidation. No large effusion. Normal pulmonary vasculature. IMPRESSION: Mild linear lung base opacities, accentuated by a shallow degree of inspiration. No consolidation or large effusion. Electronically Signed   By: Ellery Plunk M.D.   On: 09/30/2016 00:57   Dg Chest Portable 1 View  Result Date: 09/28/2016 CLINICAL DATA:  Chest wall pain EXAM: PORTABLE CHEST 1 VIEW COMPARISON:  02/16/2016 FINDINGS: Cardiomediastinal silhouette is stable. Again noted  thoracic dextroscoliosis. Stable mild ribcage asymmetry. Old right upper rib fractures are stable. Atherosclerotic calcifications of thoracic aorta. Extensive degenerative changes bilateral shoulders. IMPRESSION: No active disease. Electronically Signed   By: Natasha Mead M.D.   On: 09/28/2016 10:33   Dg Abd 2 Views  Result Date: 09/29/2016 CLINICAL DATA:  Diarrhea all night.  Diffuse abdominal discomfort. EXAM: ABDOMEN - 2 VIEW COMPARISON:  CT from yesterday FINDINGS: Nonobstructive bowel gas pattern. Oral contrast administered yesterday has reached the left colon. Contrast also seen in the urinary bladder. Peripherally calcified right renal cyst as described on previous CT. Few colonic and small bowel fluid levels without over distention to suggest obstruction. No evidence of pneumoperitoneum. Advanced lumbar disc degeneration and levoscoliosis. IMPRESSION: 1. Nonobstructive bowel gas pattern. Oral contrast from yesterday has reached the colon. 2. Scattered bowel fluid levels correlating with diarrhea history. Electronically Signed   By: Marnee Spring M.D.   On: 09/29/2016 09:36      Sakeenah Valcarcel M 09/30/2016  Patient ID: Erin Gibson, female   DOB: 07/22/1919, 83 y.o.   MRN: [id number]

## 2016-10-01 ENCOUNTER — Encounter (HOSPITAL_COMMUNITY): Payer: Self-pay | Admitting: Cardiology

## 2016-10-01 ENCOUNTER — Inpatient Hospital Stay (HOSPITAL_COMMUNITY): Payer: Medicare Other

## 2016-10-01 DIAGNOSIS — A0811 Acute gastroenteropathy due to Norwalk agent: Secondary | ICD-10-CM

## 2016-10-01 DIAGNOSIS — I214 Non-ST elevation (NSTEMI) myocardial infarction: Secondary | ICD-10-CM

## 2016-10-01 DIAGNOSIS — E43 Unspecified severe protein-calorie malnutrition: Secondary | ICD-10-CM

## 2016-10-01 DIAGNOSIS — K567 Ileus, unspecified: Principal | ICD-10-CM

## 2016-10-01 DIAGNOSIS — I10 Essential (primary) hypertension: Secondary | ICD-10-CM

## 2016-10-01 DIAGNOSIS — R079 Chest pain, unspecified: Secondary | ICD-10-CM

## 2016-10-01 DIAGNOSIS — I420 Dilated cardiomyopathy: Secondary | ICD-10-CM

## 2016-10-01 LAB — ECHOCARDIOGRAM COMPLETE
AO mean calculated velocity dopler: 168 cm/s
AOPV: 0.37 m/s
AV Area mean vel: 0.89 cm2
AV VEL mean LVOT/AV: 0.39
AV area mean vel ind: 0.52 cm2/m2
AV peak Index: 0.5
AV pk vel: 263 cm/s
AVA: 0.96 cm2
AVAREAVTI: 0.85 cm2
AVAREAVTIIND: 0.57 cm2/m2
AVG: 14 mmHg
AVPG: 28 mmHg
CHL CUP AV VALUE AREA INDEX: 0.57
CHL CUP AV VEL: 0.96
E decel time: 352 msec
EERAT: 11.07
FS: 26 % — AB (ref 28–44)
HEIGHTINCHES: 58 in
IV/PV OW: 0.85
LA diam index: 1.72 cm/m2
LA vol index: 28.6 mL/m2
LA vol: 48.3 mL
LASIZE: 29 mm
LAVOLA4C: 70.4 mL
LDCA: 2.27 cm2
LEFT ATRIUM END SYS DIAM: 29 mm
LV PW d: 9.93 mm — AB (ref 0.6–1.1)
LV TDI E'MEDIAL: 6.53
LVEEAVG: 11.07
LVEEMED: 11.07
LVELAT: 5.87 cm/s
LVOT VTI: 21.4 cm
LVOT peak VTI: 0.42 cm
LVOT peak vel: 98.3 cm/s
LVOTD: 17 mm
LVOTSV: 49 mL
Lateral S' vel: 20.6 cm/s
MV Dec: 352
MV pk A vel: 127 m/s
MV pk E vel: 65 m/s
Reg peak vel: 299 cm/s
TDI e' lateral: 5.87
TR max vel: 299 cm/s
VTI: 50.5 cm
WEIGHTICAEL: 2702.4 [oz_av]

## 2016-10-01 LAB — PROTIME-INR
INR: 1.08
Prothrombin Time: 14.1 seconds (ref 11.4–15.2)

## 2016-10-01 LAB — BASIC METABOLIC PANEL
ANION GAP: 13 (ref 5–15)
BUN: 10 mg/dL (ref 6–20)
CALCIUM: 9.1 mg/dL (ref 8.9–10.3)
CO2: 17 mmol/L — ABNORMAL LOW (ref 22–32)
Chloride: 112 mmol/L — ABNORMAL HIGH (ref 101–111)
Creatinine, Ser: 0.83 mg/dL (ref 0.44–1.00)
GFR, EST NON AFRICAN AMERICAN: 58 mL/min — AB (ref >60)
Glucose, Bld: 64 mg/dL — ABNORMAL LOW (ref 65–99)
POTASSIUM: 3.8 mmol/L (ref 3.5–5.1)
SODIUM: 142 mmol/L (ref 135–145)

## 2016-10-01 LAB — CBC
HEMATOCRIT: 40.9 % (ref 36.0–46.0)
Hemoglobin: 13 g/dL (ref 12.0–15.0)
MCH: 27.7 pg (ref 26.0–34.0)
MCHC: 31.8 g/dL (ref 30.0–36.0)
MCV: 87.2 fL (ref 78.0–100.0)
Platelets: 212 10*3/uL (ref 150–400)
RBC: 4.69 MIL/uL (ref 3.87–5.11)
RDW: 14.9 % (ref 11.5–15.5)
WBC: 5 10*3/uL (ref 4.0–10.5)

## 2016-10-01 LAB — GLUCOSE, CAPILLARY: Glucose-Capillary: 77 mg/dL (ref 65–99)

## 2016-10-01 LAB — APTT: aPTT: 30 seconds (ref 24–36)

## 2016-10-01 LAB — TROPONIN I
TROPONIN I: 1.15 ng/mL — AB (ref <0.03)
TROPONIN I: 0.42 ng/mL — AB (ref <0.03)
TROPONIN I: 0.84 ng/mL — AB (ref <0.03)
Troponin I: 1.37 ng/mL (ref <0.03)

## 2016-10-01 LAB — MAGNESIUM: MAGNESIUM: 1.5 mg/dL — AB (ref 1.7–2.4)

## 2016-10-01 LAB — HEPARIN LEVEL (UNFRACTIONATED): Heparin Unfractionated: 0.16 IU/mL — ABNORMAL LOW (ref 0.30–0.70)

## 2016-10-01 MED ORDER — POTASSIUM CHLORIDE IN NACL 20-0.9 MEQ/L-% IV SOLN
INTRAVENOUS | Status: DC
  Administered 2016-10-01 – 2016-10-04 (×7): via INTRAVENOUS
  Filled 2016-10-01 (×6): qty 1000

## 2016-10-01 MED ORDER — POLYVINYL ALCOHOL 1.4 % OP SOLN
1.0000 [drp] | OPHTHALMIC | Status: DC | PRN
  Administered 2016-10-01 – 2016-10-03 (×5): 1 [drp] via OPHTHALMIC
  Filled 2016-10-01: qty 15

## 2016-10-01 MED ORDER — POTASSIUM CHLORIDE IN NACL 20-0.9 MEQ/L-% IV SOLN
INTRAVENOUS | Status: DC
  Filled 2016-10-01: qty 1000

## 2016-10-01 MED ORDER — ISOSORBIDE MONONITRATE ER 30 MG PO TB24
30.0000 mg | ORAL_TABLET | Freq: Every day | ORAL | Status: DC
  Administered 2016-10-01: 30 mg via ORAL
  Filled 2016-10-01: qty 1

## 2016-10-01 MED ORDER — HEPARIN BOLUS VIA INFUSION
1000.0000 [IU] | Freq: Once | INTRAVENOUS | Status: AC
  Administered 2016-10-01: 1000 [IU] via INTRAVENOUS
  Filled 2016-10-01: qty 1000

## 2016-10-01 MED ORDER — HEPARIN BOLUS VIA INFUSION
2800.0000 [IU] | Freq: Once | INTRAVENOUS | Status: AC
  Administered 2016-10-01: 2800 [IU] via INTRAVENOUS
  Filled 2016-10-01: qty 2800

## 2016-10-01 MED ORDER — HEPARIN (PORCINE) IN NACL 100-0.45 UNIT/ML-% IJ SOLN
850.0000 [IU]/h | INTRAMUSCULAR | Status: DC
  Administered 2016-10-01: 750 [IU]/h via INTRAVENOUS
  Administered 2016-10-02 – 2016-10-03 (×2): 850 [IU]/h via INTRAVENOUS
  Filled 2016-10-01 (×2): qty 250

## 2016-10-01 MED ORDER — HEPARIN (PORCINE) IN NACL 100-0.45 UNIT/ML-% IJ SOLN
550.0000 [IU]/h | INTRAMUSCULAR | Status: DC
  Administered 2016-10-01: 550 [IU]/h via INTRAVENOUS
  Filled 2016-10-01: qty 250

## 2016-10-01 MED ORDER — CARVEDILOL 3.125 MG PO TABS
3.1250 mg | ORAL_TABLET | Freq: Two times a day (BID) | ORAL | Status: DC
  Administered 2016-10-01 – 2016-10-02 (×3): 3.125 mg via ORAL
  Filled 2016-10-01 (×3): qty 1

## 2016-10-01 MED ORDER — ENOXAPARIN SODIUM 40 MG/0.4ML ~~LOC~~ SOLN: 40.0000 mg | SUBCUTANEOUS | Status: DC

## 2016-10-01 NOTE — Progress Notes (Signed)
  Echocardiogram 2D Echocardiogram has been performed.  Nolon RodBrown, Tony 10/01/2016, 9:59 AM

## 2016-10-01 NOTE — Progress Notes (Addendum)
ANTICOAGULATION CONSULT NOTE - Follow-Up Consult  Pharmacy Consult for Heparin Indication: chest pain/ACS  Allergies  Allergen Reactions  . Tramadol Other (See Comments)    Auditory and visual hallucinations  . Codeine Nausea And Vomiting    Violently ill  . Ibuprofen Nausea And Vomiting  . Diltiazem Hcl Other (See Comments)    Not sure about this reaction    Patient Measurements: Height: 4\' 10"  (147.3 cm) Weight: 168 lb 14.4 oz (76.6 kg) Heparin Dosing Weight: 62.8 kg  Vital Signs: Temp: 97.8 F (36.6 C) (01/08 1244) Temp Source: Oral (01/08 1244) BP: 147/79 (01/08 1800) Pulse Rate: 76 (01/08 1800)  Labs:  Recent Labs  09/29/16 0312  09/30/16 0150  10/01/16 0553 10/01/16 0902 10/01/16 1102 10/01/16 1719  HGB 10.4*  --  11.4*  --   --  13.0  --   --   HCT 32.4*  --  35.6*  --   --  40.9  --   --   PLT 179  --  196  --   --  212  --   --   APTT  --   --   --   --   --  30  --   --   LABPROT  --   --   --   --   --  14.1  --   --   INR  --   --   --   --   --  1.08  --   --   HEPARINUNFRC  --   --   --   --   --   --   --  0.16*  CREATININE 1.03*  --  1.00  --  0.83  --   --   --   TROPONINI <0.03  < > 0.03*  < > 0.84*  --  1.37* 1.15*  < > = values in this interval not displayed.  Estimated Creatinine Clearance: 35.3 mL/min (by C-G formula based on SCr of 0.83 mg/dL).   Medical History: Past Medical History:  Diagnosis Date  . ANEMIA 03/31/2009  . Chronic kidney disease   . DEGENERATIVE JOINT DISEASE, GENERALIZED 05/06/2007  . Diverticulosis of sigmoid colon    Colonoscopy 2011  . DVT (deep venous thrombosis) (HCC)    hx of  . GERD 04/24/2007  . HYPERTENSION 04/24/2007  . VENOUS STASIS ULCER 07/20/2008    Medications:  Scheduled:  . carvedilol  3.125 mg Oral BID WC  . cefTRIAXone (ROCEPHIN)  IV  1 g Intravenous Q24H  . heparin  1,000 Units Intravenous Once  . hydrALAZINE  5 mg Intravenous Q6H  . isosorbide mononitrate  30 mg Oral Daily  .  morphine  injection  1 mg Intravenous Once   Infusions:  . 0.9 % NaCl with KCl 20 mEq / L 75 mL/hr at 10/01/16 0830  . heparin      Assessment: 89 yoF admitted on 1/5 with abdominal pain/N/V, suspected SBO. On 1/8, she c/o sudden chest pain when [facility] was raised, elevated troponin, but no EKG changes.  Pharmacy consulted to start IV heparin for ACS. Last dose of prophylactic Lovenox given 1/7 PM. Echo with new drop in EF with anterior WMA, and per cardiology assessment more consistent with Takotsubo CM. Cardiology recommending IV heparin x 48 hours.     Today, 10/01/2016:  Troponin: 0.31 > 0.42 > 0.84 > 1.37 > 1.15  Baseline coags: INR 1.08, APTT 30  CBC: Hgb 13 is stable/improved,  Pltc WNL  SCr 0.83 with CrCl ~ 35 ml/min  First heparin level @ 1719 = 0.16 units/mL, subtherapeutic on heparin infusion at 550 units/hr  No bleeding or line issues reported per nursing  Goal of Therapy:  Heparin level 0.3-0.7 units/ml Monitor platelets by anticoagulation protocol: Yes   Plan:   Heparin infusion 1000 units IV bolus x 1, then increase heparin infusion to 750 units/hr per low heparin level/adjusted heparin dosing weight as above (due to current patient parameters)  Heparin level 8 hours after rate change  Daily heparin level and CBC while on heparin infusion  Monitor closely for s/sx of bleeding   Greer PickerelJigna Alzena Gerber, PharmD, BCPS Pager: [phone] 10/01/2016 6:50 PM

## 2016-10-01 NOTE — Progress Notes (Signed)
Patient c/o sudden chest pain around 0805 this AM. Pt stated she had no pain all night while lying flat, but pain started once HOB was raised to sit patient up. Per pt, pain worsens with any movement or activity. Pain rated at a 6 out of 10. Pt describes pain as "I just feel like I can't breathe." PRN Nitro given with improvement noted and 2L O2/[place] applied. VS stable. MD Jarvis NewcomerGrunz paged and assessed pt at bedside. Cardiology Consult ordered.

## 2016-10-01 NOTE — Progress Notes (Signed)
ANTICOAGULATION CONSULT NOTE - Initial Consult  Pharmacy Consult for Heparin Indication: chest pain/ACS  Allergies  Allergen Reactions  . Tramadol Other (See Comments)    Auditory and visual hallucinations  . Codeine Nausea And Vomiting    Violently ill  . Ibuprofen Nausea And Vomiting  . Diltiazem Hcl Other (See Comments)    Not sure about this reaction    Patient Measurements: Height: 4\' 10"  (147.3 cm) Weight: 168 lb 14.4 oz (76.6 kg) IBW/kg (Calculated) : 40.9 Heparin Dosing Weight: 47 kg  Vital Signs: Temp: 97.5 F (36.4 C) (01/08 0808) Temp Source: Oral (01/08 0808) BP: 122/79 (01/08 0834) Pulse Rate: 88 (01/08 0834)  Labs:  Recent Labs  09/28/16 1034 09/28/16 1044  09/29/16 0312  09/30/16 0150  09/30/16 2005 10/01/16 0048 10/01/16 0553  HGB 12.2 13.3  --  10.4*  --  11.4*  --   --   --   --   HCT 37.9 39.0  --  32.4*  --  35.6*  --   --   --   --   PLT 233  --   --  179  --  196  --   --   --   --   CREATININE 1.07* 1.10*  --  1.03*  --  1.00  --   --   --  0.83  TROPONINI  --   --   < > <0.03  < > 0.03*  < > 0.31* 0.42* 0.84*  < > = values in this interval not displayed.  Estimated Creatinine Clearance: 35.3 mL/min (by C-G formula based on SCr of 0.83 mg/dL).   Medical History: Past Medical History:  Diagnosis Date  . ANEMIA 03/31/2009  . Chronic kidney disease   . DEGENERATIVE JOINT DISEASE, GENERALIZED 05/06/2007  . Diverticulosis of sigmoid colon    Colonoscopy 2011  . DVT (deep venous thrombosis) (HCC)    hx of  . GERD 04/24/2007  . HYPERTENSION 04/24/2007  . VENOUS STASIS ULCER 07/20/2008    Medications:  Scheduled:  . cefTRIAXone (ROCEPHIN)  IV  1 g Intravenous Q24H  . hydrALAZINE  5 mg Intravenous Q6H  . metoprolol  2.5 mg Intravenous Q8H  .  morphine injection  1 mg Intravenous Once   Infusions:  . 0.9 % NaCl with KCl 20 mEq / L 75 mL/hr at 10/01/16 0830    Assessment: 89 yoF admitted on 1/5 with abdominal pain/N/V, suspected SBO.   [facility]8 [facility], but no EKG changes.  Pharmacy is consulted to dose Heparin for ACS. Last dose of prophylactic Lovenox given 1/7 PM.    Today, 10/01/2016:  Troponin: 0.31 > 0.42 > 0.84  Baseline coags: INR 1.08, APTT 30  CBC: Hgb 13 is stable/improved, Plt WNL  SCr 0.83 with CrCl ~ 35 ml/min   Goal of Therapy:  Heparin level 0.3-0.7 units/ml Monitor platelets by anticoagulation protocol: Yes   Plan:   Baseline PTT, PT/INR  Give heparin 2800 units bolus IV x 1  Start heparin IV infusion at 550 units/hr  Heparin level 8 hours after starting  Daily heparin level and CBC  Continue to monitor H&H and platelets   Lynann Beaverhristine Honore Wipperfurth PharmD, BCPS Pager [phone] 10/01/2016 8:50 AM

## 2016-10-01 NOTE — Consult Note (Signed)
Reason for Consult: chest pain and elevated troponin  Referring Physician: Dr. Bonner Puna    PCP:  Nyoka Cowden, MD  Primary Cardiologist: New (saw Dr. Haroldine Laws a year ago as regular hospital consult for elevated troponin not HF)  Erin Gibson is an 83 y.o. female.    Chief Complaint: admitted 09/29/15 with abd pain,  N&V, + ileus.  She did have chest pain  HPI:   10 yoF with no hx of CAD, was seen in 12/2015 for  Elevated troponin of 0.29  She had no chest pain at that time and EKG was stable.  Echo at that time with EF 60-65%,  Mild LVH G1DD,  Moderate AS, AVA around 1.1 cm2. , LA was moderately dilated.  Mod TR and PA pressure 59 MMHg.   No follow up and no ischemic work up.  Other hx of history of CKD stage III, hypertension, multiple abdominal surgeries, and recurrent SBO who presents to the ED for evaluation of abdominal pain, nausea, and non-bloody vomiting  Now admitted after presenting for 2 weeks of abd pain with increase prior to admit.  Initial troponins neg then on the 7th re-cycling of troponin 0.03;0.03;0.06;0.31;0.42;0.84.   Cr. Is WNL.  Hgb stable.   Now on IV heparin.    Chest pain began 09/30/16 at 1213 AM with exertion.  Seems from note that abd pain also increased.  No NG, pt now having BK.  EKGs SR 1st degree AV block and no acute changes.  Reviewed all recent EKGs.   Today she had NTG sl this AM with relief for chest pressure.   At home with most activity she develops chest pain and SOB on occ. She does have nausea as well.  All with exertion has not awakened from sleep.   She was on imdur at home 30 mg.  She has NTG but has not used as chest pain goes away with rest.       Past Medical History:  Diagnosis Date  . ANEMIA 03/31/2009  . Chronic kidney disease   . DEGENERATIVE JOINT DISEASE, GENERALIZED 05/06/2007  . Diverticulosis of sigmoid colon    Colonoscopy 2011  . DVT (deep venous thrombosis) (HCC)    hx of  . GERD 04/24/2007  .  HYPERTENSION 04/24/2007  . VENOUS STASIS ULCER 07/20/2008    Past Surgical History:  Procedure Laterality Date  . ABDOMINAL HYSTERECTOMY     Completion hysterectomy  . CATARACT EXTRACTION    . CHOLECYSTECTOMY OPEN    . HERNIA REPAIR    . PARTIAL HYSTERECTOMY     Salpingo-oophorectomy as well  . TOTAL KNEE ARTHROPLASTY     x2    Family History  Problem Relation Age of Onset  . Heart attack Sister    Social History:  reports that she has never smoked. She has never used smokeless tobacco. She reports that she does not drink alcohol or use drugs.  Allergies:  Allergies  Allergen Reactions  . Tramadol Other (See Comments)    Auditory and visual hallucinations  . Codeine Nausea And Vomiting    Violently ill  . Ibuprofen Nausea And Vomiting  . Diltiazem Hcl Other (See Comments)    Not sure about this reaction    OUTPATIENT MEDICATIONS: No current facility-administered medications on file prior to encounter.    Current Outpatient Prescriptions on File Prior to Encounter  Medication Sig Dispense Refill  . acetaminophen (TYLENOL) 325 MG tablet Take 2 tablets (  650 mg total) by mouth every 4 (four) hours as needed for mild pain or moderate pain. (Patient taking differently: Take 650 mg by mouth at bedtime. ) 30 tablet 0  . amLODipine (NORVASC) 2.5 MG tablet Take 1 tablet (2.5 mg total) by mouth daily. 90 tablet 3  . aspirin EC 81 MG tablet Take 81 mg by mouth every morning.    . clonazePAM (KLONOPIN) 0.5 MG tablet Take 0.5 tablets (0.25 mg total) by mouth 3 (three) times daily as needed for anxiety. 30 tablet 1  . Cyanocobalamin (VITAMIN B-12) 2500 MCG SUBL Place 2,500 mcg under the tongue every morning.     . furosemide (LASIX) 40 MG tablet TAKE 1/2 TABLET (=20MG)    DAILY (Patient taking differently: Take 20 mg by mouth daily. TAKE 1/2 TABLET (=20MG)    DAILY) 45 tablet 3  . isosorbide mononitrate (IMDUR) 30 MG 24 hr tablet Take 1 tablet (30 mg total) by mouth daily. 30 tablet 4    . lisinopril (PRINIVIL,ZESTRIL) 20 MG tablet Take 1 tablet (20 mg total) by mouth daily. 90 tablet 1  . Melatonin 3 MG TABS Take 3 mg by mouth at bedtime.    . Multiple Vitamins-Minerals (PRESERVISION AREDS 2) CAPS Take 1 capsule by mouth daily. 90 capsule 3  . nitroGLYCERIN (NITROSTAT) 0.4 MG SL tablet Place 1 tablet (0.4 mg total) under the tongue every 5 (five) minutes as needed for chest pain. 25 tablet 2  . Polyethyl Glycol-Propyl Glycol (SYSTANE) 0.4-0.3 % SOLN Place 2 drops into both eyes 2 (two) times daily.    . polyethylene glycol powder (GLYCOLAX/MIRALAX) powder MIX 17GM IN LIQUID AND     DRINK TWO TIMES A DAY 3162 g 3  . sertraline (ZOLOFT) 25 MG tablet Take 1 tablet (25 mg total) by mouth daily. 90 tablet 1  . cefdinir (OMNICEF) 300 MG capsule Take 1 capsule (300 mg total) by mouth daily. (Patient not taking: Reported on 09/28/2016) 7 capsule 0   CURRENT MEDICATIONS: Scheduled Meds: . carvedilol  3.125 mg Oral BID WC  . cefTRIAXone (ROCEPHIN)  IV  1 g Intravenous Q24H  . hydrALAZINE  5 mg Intravenous Q6H  . isosorbide mononitrate  30 mg Oral Daily  .  morphine injection  1 mg Intravenous Once   Continuous Infusions: . 0.9 % NaCl with KCl 20 mEq / L 75 mL/hr at 10/01/16 0830  . heparin 550 Units/hr (10/01/16 0932)   PRN Meds:.acetaminophen **OR** acetaminophen, morphine injection, nitroGLYCERIN, ondansetron **OR** ondansetron (ZOFRAN) IV, polyvinyl alcohol   Results for orders placed or performed during the hospital encounter of 09/28/16 (from the past 48 hour(s))  CBC     Status: Abnormal   Collection Time: 09/30/16  1:50 AM  Result Value Ref Range   WBC 3.0 (L) 4.0 - 10.5 K/uL   RBC 3.98 3.87 - 5.11 MIL/uL   Hemoglobin 11.4 (L) 12.0 - 15.0 g/dL   HCT 35.6 (L) 36.0 - 46.0 %   MCV 89.4 78.0 - 100.0 fL   MCH 28.6 26.0 - 34.0 pg   MCHC 32.0 30.0 - 36.0 g/dL   RDW 15.1 11.5 - 15.5 %   Platelets 196 150 - 400 K/uL  Basic metabolic panel     Status: Abnormal   Collection  Time: 09/30/16  1:50 AM  Result Value Ref Range   Sodium 142 135 - 145 mmol/L   Potassium 4.0 3.5 - 5.1 mmol/L   Chloride 117 (H) 101 - 111 mmol/L   CO2  19 (L) 22 - 32 mmol/L   Glucose, Bld 83 65 - 99 mg/dL   BUN 21 (H) 6 - 20 mg/dL   Creatinine, Ser 1.00 0.44 - 1.00 mg/dL   Calcium 8.8 (L) 8.9 - 10.3 mg/dL   GFR calc non Af Amer 46 (L) >60 mL/min   GFR calc Af Amer 54 (L) >60 mL/min    Comment: (NOTE) The eGFR has been calculated using the CKD EPI equation. This calculation has not been validated in all clinical situations. eGFR's persistently <60 mL/min signify possible Chronic Kidney Disease.    Anion gap 6 5 - 15  Magnesium     Status: None   Collection Time: 09/30/16  1:50 AM  Result Value Ref Range   Magnesium 1.7 1.7 - 2.4 mg/dL  Troponin I     Status: Abnormal   Collection Time: 09/30/16  1:50 AM  Result Value Ref Range   Troponin I 0.03 (HH) <0.03 ng/mL    Comment: CRITICAL RESULT CALLED TO, READ BACK BY AND VERIFIED WITHBarbera Setters RN [id number] 09/30/16 A NAVARRO   Troponin I     Status: Abnormal   Collection Time: 09/30/16  7:15 AM  Result Value Ref Range   Troponin I 0.03 (HH) <0.03 ng/mL    Comment: CRITICAL VALUE NOTED.  VALUE IS CONSISTENT WITH PREVIOUSLY REPORTED AND CALLED VALUE.  Troponin I (q 6hr x 3)     Status: Abnormal   Collection Time: 09/30/16  2:03 PM  Result Value Ref Range   Troponin I 0.06 (HH) <0.03 ng/mL    Comment: CRITICAL VALUE NOTED.  VALUE IS CONSISTENT WITH PREVIOUSLY REPORTED AND CALLED VALUE.  Troponin I (q 6hr x 3)     Status: Abnormal   Collection Time: 09/30/16  8:05 PM  Result Value Ref Range   Troponin I 0.31 (HH) <0.03 ng/mL    Comment: CRITICAL VALUE NOTED.  VALUE IS CONSISTENT WITH PREVIOUSLY REPORTED AND CALLED VALUE.  Troponin I (q 6hr x 3)     Status: Abnormal   Collection Time: 10/01/16 12:48 AM  Result Value Ref Range   Troponin I 0.42 (HH) <0.03 ng/mL    Comment: CRITICAL VALUE NOTED.  VALUE IS CONSISTENT WITH PREVIOUSLY  REPORTED AND CALLED VALUE.  Basic metabolic panel     Status: Abnormal   Collection Time: 10/01/16  5:53 AM  Result Value Ref Range   Sodium 142 135 - 145 mmol/L   Potassium 3.8 3.5 - 5.1 mmol/L   Chloride 112 (H) 101 - 111 mmol/L   CO2 17 (L) 22 - 32 mmol/L   Glucose, Bld 64 (L) 65 - 99 mg/dL   BUN 10 6 - 20 mg/dL   Creatinine, Ser 0.83 0.44 - 1.00 mg/dL   Calcium 9.1 8.9 - 10.3 mg/dL   GFR calc non Af Amer 58 (L) >60 mL/min   GFR calc Af Amer >60 >60 mL/min    Comment: (NOTE) The eGFR has been calculated using the CKD EPI equation. This calculation has not been validated in all clinical situations. eGFR's persistently <60 mL/min signify possible Chronic Kidney Disease.    Anion gap 13 5 - 15  Magnesium     Status: Abnormal   Collection Time: 10/01/16  5:53 AM  Result Value Ref Range   Magnesium 1.5 (L) 1.7 - 2.4 mg/dL  Troponin I (q 6hr x 3)     Status: Abnormal   Collection Time: 10/01/16  5:53 AM  Result Value Ref Range  Troponin I 0.84 (HH) <0.03 ng/mL    Comment: CRITICAL VALUE NOTED.  VALUE IS CONSISTENT WITH PREVIOUSLY REPORTED AND CALLED VALUE.  Glucose, capillary     Status: None   Collection Time: 10/01/16  8:41 AM  Result Value Ref Range   Glucose-Capillary 77 65 - 99 mg/dL  APTT     Status: None   Collection Time: 10/01/16  9:02 AM  Result Value Ref Range   aPTT 30 24 - 36 seconds  Protime-INR     Status: None   Collection Time: 10/01/16  9:02 AM  Result Value Ref Range   Prothrombin Time 14.1 11.4 - 15.2 seconds   INR 1.08   CBC     Status: None   Collection Time: 10/01/16  9:02 AM  Result Value Ref Range   WBC 5.0 4.0 - 10.5 K/uL   RBC 4.69 3.87 - 5.11 MIL/uL   Hemoglobin 13.0 12.0 - 15.0 g/dL   HCT 40.9 36.0 - 46.0 %   MCV 87.2 78.0 - 100.0 fL   MCH 27.7 26.0 - 34.0 pg   MCHC 31.8 30.0 - 36.0 g/dL   RDW 14.9 11.5 - 15.5 %   Platelets 212 150 - 400 K/uL  Troponin I (q 6hr x 3)     Status: Abnormal   Collection Time: 10/01/16 11:02 AM  Result  Value Ref Range   Troponin I 1.37 (HH) <0.03 ng/mL    Comment: CRITICAL VALUE NOTED.  VALUE IS CONSISTENT WITH PREVIOUSLY REPORTED AND CALLED VALUE.   Dg Abd 1 View  Result Date: 09/30/2016 CLINICAL DATA:  Abdominal pain. EXAM: ABDOMEN - 1 VIEW COMPARISON:  September 29, 2016 FINDINGS: Nonobstructive bowel gas pattern. Air-filled nondilated loops of bowel seen diffusely throughout the abdomen. No other interval changes or acute abnormalities. IMPRESSION: Numerous air-filled nondilated loops of bowel could represent mild ileus. No evidence of obstruction. Electronically Signed   By: Dorise Bullion III M.D   On: 09/30/2016 16:17   Dg Chest Port 1 View  Result Date: 09/30/2016 CLINICAL DATA:  Intermittent upper chest pain with activity, accompanied by dyspnea. EXAM: PORTABLE CHEST 1 VIEW COMPARISON:  09/28/2016 FINDINGS: Shallow inspiration. Mild linear atelectatic appearing opacities in the bases, accentuated by the shallow degree of inspiration. No confluent consolidation. No large effusion. Normal pulmonary vasculature. IMPRESSION: Mild linear lung base opacities, accentuated by a shallow degree of inspiration. No consolidation or large effusion. Electronically Signed   By: Andreas Newport M.D.   On: 09/30/2016 00:57    IWO:EHOZYYQ:MG colds or fevers, no weight changes Skin:no rashes or ulcers HEENT:no blurred vision, no congestion CV:see HPI PUL:see HPI GI:no diarrhea constipation or melena, no indigestion GU:no hematuria, no dysuria MS:no joint pain, no claudication Neuro:no syncope, no lightheadedness Endo:no diabetes + hx hypoglycemia, no thyroid disease   Blood pressure (!) 158/70, pulse 72, temperature 97.8 F (36.6 C), temperature source Oral, resp. rate 18, height _0  (1.473 m), weight 76.6 kg (168 lb 14.4 oz), SpO2 98 %.  Wt Readings from Last 3 Encounters:  10/01/16 76.6 kg (168 lb 14.4 oz)  08/09/16 51.4 kg (113 lb 5 oz)  07/09/16 49 kg (108 lb)    PE: General:Pleasant  affect, NAD Skin:Warm and dry, brisk capillary refill HEENT:normocephalic, sclera clear, mucus membranes moist, hard of hearing Neck:supple, no JVD, no bruits  Heart:S1S2 RRR with 5-0/0 systolic aortic murmur, no gallup, rub or click Lungs:clear without rales, rhonchi, or wheezes BBC:WUGQ, non tender, + BS, do not palpate liver spleen or  masses Ext:no lower ext edema, 2+ pedal pulses, 2+ radial pulses, back + scoliosis  Neuro:alert and oriented X 3, MAE, follows commands, + facial symmetry    Assessment/Plan Active Problems:   NSTEMI (non-ST elevated myocardial infarction) (HCC)   Essential hypertension   SBO (small bowel obstruction) (HCC)   UTI (urinary tract infection)   Nausea with vomiting   CKD (chronic kidney disease), stage III   Protein-calorie malnutrition, severe   Small bowel obstruction   Ileus (HCC)   Norovirus   Pulmonary hypertension   Congestive dilated cardiomyopathy (HCC)   Elevated troponin, no EKG changes,  Now on IV heparin, - continue for now.  Symptoms do indicate angina that has been ongoing.   Will resume imdur but at 60 mg.  Agree with low dose BB.  Hold ACE for now.  Possible nuc study vs. Cardiac cath.  Pt lives alone though has help to come in, her daughter lives in [place].  Dr. Ellyn Hack to see.  Agree with echo as well.    Pt stated she passed out yesterday due to hypoglycemia.  Now eating, ? Add boost.      Cecilie Kicks  Nurse Practitioner Certified [place] Pager [phone] or after 5pm or weekends call [phone] 10/01/2016, 10:07 AM  I have seen, examined and evaluated the patient this PM along with Cecilie Kicks, NP.  After reviewing all the available data and chart, we discussed the patients laboratory, study & physical findings as well as symptoms in detail. I agree with her findings, examination as well as impression recommendations as per our discussion.    Very pleasant elderly woman admitted for ~Ileus & tested +  for Norovirus & C-diff.  Has noted some exertional CP & dependent edema in the recent future.  Had prolonged episodes of CP (while on bedpan & when she was upset) -- had ruled in with + Troponin for ? NSTEMI.  Echo with new drop in EF with anterior WMA that is consistent with a large Anterior infarct that one would expect a more substantial Troponin bump from.  -- I suspect that with the anterior WMA & low EF, that this may be more c/w Takotsubo CM  I had a long talk with the patient - she does not want any invasive procedures -- therefore no plan for Cath or ST.  Will opt for medical management.  ~48 hr of Heparin would be reasonable.   Is now taking PO - agree with Imdur.  Will also add Coreg @ low dose & titrate up based upon BP/HR & Sx. Not sure of any long term benefit from statin, & would be reluctant to be too aggressive with antihypertensive Rx - so hold off on ARB/ACE-I.    Glenetta Hew, M.D., M.S. Interventional Cardiologist   Pager # [phone] Phone # [phone] [address] [place], [place] [address]

## 2016-10-01 NOTE — Progress Notes (Signed)
PROGRESS NOTE  Erin Gibson  ZOX:[id number] DOB: 04-22-1919 [name]: 09/28/2016 PCP: Rogelia Boga, MD   Brief Narrative:  83 y.o.WF PMHx CKD stage III, HTN, multiple abdominal surgeries, recurrent SBO  Presents to the ED for evaluation of abdominal pain, nausea, and non-bloody vomiting. The patient says that she has had 2 weeks of abdominal pain, but it has worsened in the past 24 hours with numerous episodes of emesis on the night prior to admission. In addition, the patient had 3 loose stools on the evening of 09/27/2016. She complains of chronic dysuria. She states that she was last on antibiotics around November 2017 for a UTI. She denies any sick contacts. The patient describes her pain as severe and crampy and bandlike across the lower abdomen without any alleviating factors. She complains of some shortness of breath for the last several weeks which has been unchanged, but denies any chest discomfort, hemoptysis, coughing. The patient was recently discharged from the hospital after a stay from 08/05/2016 through 08/10/2016 at which time she was treated for small bowel obstruction. It resolved with medical management. She was also treated for UTI during that period of time.  In the emergency department, the patient was afebrile and hemodynamically stable with oxygen saturation 96-98% on room air. BMP and CBC were unremarkable. LFTs were unremarkable. Urinalysis is negative for pyuria but positive for nitrites. CT of the abdomen and pelvis showed mildly distended fluid-filled loops of small bowel with no transition point with concerns of enteritis versus partial small bowel obstruction. There was also liquid stool noted in the colon. Chest x-ray was negative for any acute findings. She was admitted and was found to have norovirus gastroenteritis. With conservative management, obstruction resolved with contrast to the left colon on AXR 1/6. Diet was advanced. Overnight into 1/8 she experienced  acute exertional substernal chest pain, and cardiac enzymes trended upward. Heparin was started. ECG showed changes in repolarization and echocardiogram showed new reduced EF with apical akinesis and inferoseptal aneurysmal dilatation. Cardiology was consulted and, after long discussion with the patient, recommend a medical plan of care. Heparin is to be continued 48 hrs prior to discharge to SNF.  Subjective: Patient without complaints on rounds this morning but confirms that the center of her chest ached when she was getting around to the bedside commode yesterday which was relieved partially by rest and completely by NTG, associated with dyspnea. She now has a headache without vision changes.   Assessment & Plan:   Active Problems:   Essential hypertension   SBO (small bowel obstruction) (HCC)   UTI (urinary tract infection)   Nausea with vomiting   CKD (chronic kidney disease), stage III   Protein-calorie malnutrition, severe   Small bowel obstruction   Ileus (HCC)   Norovirus   Pulmonary hypertension   NSTEMI (non-ST elevated myocardial infarction) (HCC)   Congestive dilated cardiomyopathy (HCC)  Partial small bowel obstruction/ileus: 1/7 KUB shows some loops of small bowel without obstruction.  - General surgery signed off - Advance diet >> soft  Norovirus gastroenteritis C. diff ( antigen +, toxin negative), not consistent with active C. difficile infection. Off antibiotics. Flu PCR neg. - Enteric precautions - Monitor symptoms  Recurrent UTI/Dysuria: With chronic bladder prolapse. - UA appeared infected at admission with +nitrites but no culture sent. - Will complete 5 days ceftriaxone empirically.  - Urine culture was sent this morning? Will follow up.. - Follow up with urology as outpatient  NSTEMI: With large anterior WMA on echocardiogram  1/8 but clinically stable.  - Appreciate cardiology input. Suspicious for Takotsubo? - Continue heparin gtt x48 hrs per  cardiology rec's. No intervention planned after full discussion with the patient.  - Imdur added - Titrate coreg based on HR/BP - No plan to start statin or ACE/ARB - Continue O2 prn, morphine and NTG prn chest pain  Pulmonary hypertension  Hypertension - Hydralazine IV 5 mg  QID -Metoprolol IV 2.5 mg TID  CKD stage III(-Baseline creatinine 1.0-1.1): At baseline.  Depression/anxiety - Continue clonazepam and SSRI. Discussed risks/benefits of this with patient especially Re: recurrent falls.   Recurrent falls: walks with a walker, lives by herself, has personal care giver coming in day time. - Plan to discharge to SNF per PT recommendations.   DVT prophylaxis: Heparin gtt Code Status: DO NOT RESUSCITATE Family Communication: Declined offer to call this AM Disposition Plan: Discharge to SNF if continues to be clinically stable on heparin gtt. Anticipate DC 1/10.  Consultants:  Cardiology  Procedures/Significant Events:  1/5 CT abdomen pelvis W contrast:-Mildly distended and fluid-filled small bowel throughout the Abdomen.  -Liquid stool noted throughout the colon. - Slight interval change in the appearance of the left lower quadrant inguinal hernia which now contains a portion of the anterior wall of the adjacent sigmoid colon. However, there is no associated inflammation or evidence of obstruction related to this mild change. -Fusiform infrarenal abdominal aortic aneurysm measures 3.2 cm, unchanged.  1/7 ZOX:[id number] air-filled nondilated loops of bowel could represent mild ileus  ECHOCARDIOGRAM 10/01/2016: Study Conclusions - Left ventricle: LVEF is approximately 35% with akinesis of the   apical of LV with aneurysmal dilitation of the distal   inferior/inferoseptal walls. SInce echo of APril 2017, these   changes are new. The cavity size was normal. Wall thickness was   normal. Doppler parameters are consistent with abnormal left   ventricular relaxation (grade 1  diastolic dysfunction). - Aortic valve: AV is thickened, calcified with restricted motion   Peak and mean gradients through the valve are 32 and 17 mm Hg   respectively This is consistent with mild AS. 2D imaging suggests   that AS is more moderate is severity Gradient may be dow because   of depressed LVEF There was trivial regurgitation. - Tricuspid valve: There was mild-moderate regurgitation. - Pulmonary arteries: PA peak pressure: 44 mm Hg (S).  Cultures 1/5 C. difficile positive antigen: Negative toxin 1/5 GI panel positive Norovirus 1/8 urine culture pending  Continuous Infusions: . 0.9 % NaCl with KCl 20 mEq / L 75 mL/hr at 10/01/16 0830  . heparin 550 Units/hr (10/01/16 0932)    Objective: Vitals:   10/01/16 0808 10/01/16 0834 10/01/16 1244 10/01/16 1702  BP: (!) 156/70 122/79 (!) 158/70 139/83  Pulse: 78 88 72 79  Resp: 20 20 18    Temp: 97.5 F (36.4 C)  97.8 F (36.6 C)   TempSrc: Oral  Oral   SpO2: 98% 98% 98%   Weight:      Height:        Intake/Output Summary (Last 24 hours) at 10/01/16 1735 Last data filed at 10/01/16 0830  Gross per 24 hour  Intake              360 ml  Output              875 ml  Net             -515 ml   Filed Weights   09/28/16 1014 10/01/16 0118  10/01/16 0454  Weight: 48.5 kg (107 lb) 47.1 kg (103 lb 13.4 oz) 76.6 kg (168 lb 14.4 oz)    Examination:  General: Pleasant, active, elderly female in no distress Eyes: negative scleral hemorrhage, negative anisocoria, negative icterus ENT: Negative Runny nose, negative gingival bleeding, Neck:  Negative scars, masses, torticollis, lymphadenopathy, JVD Lungs: Clear to auscultation bilaterally without wheezes or crackles Cardiovascular: Tachycardic, Regular rhythm without murmur gallop or rub normal S1 and S2. No JVD or pedal edema. Abdomen: Soft, not tender to palpation. Hypoactive bowel sounds. Nondistended.  Extremities: No significant cyanosis, clubbing, or edema bilateral lower  extremities Skin: Negative rashes, lesions, ulcers Psychiatric:  Negative depression, negative anxiety, negative fatigue, negative mania  Central nervous system:  Cranial nerves II through XII intact, tongue/uvula midline, all extremities muscle strength 5/5, sensation intact throughout,  negative dysarthria, negative expressive aphasia, negative receptive aphasia.  Data Reviewed: Care during the described time interval was provided by me .  I have reviewed this patient's available data, including medical history, events of note, physical examination, and all test results as part of my evaluation.  I have personally reviewed and interpreted all radiology studies.  CBC:  Recent Labs Lab 09/28/16 1034 09/28/16 1044 09/29/16 0312 09/30/16 0150 10/01/16 0902  WBC 6.6  --  3.4* 3.0* 5.0  NEUTROABS 6.3  --   --   --   --   HGB 12.2 13.3 10.4* 11.4* 13.0  HCT 37.9 39.0 32.4* 35.6* 40.9  MCV 87.5  --  89.3 89.4 87.2  PLT 233  --  179 196 212   Basic Metabolic Panel:  Recent Labs Lab 09/28/16 1034 09/28/16 1044 09/29/16 0312 09/30/16 0150 10/01/16 0553  NA 141 145 141 142 142  K 3.6 3.7 3.5 4.0 3.8  CL 110 111 114* 117* 112*  CO2 22  --  21* 19* 17*  GLUCOSE 159* 156* 79 83 64*  BUN 32* 31* 27* 21* 10  CREATININE 1.07* 1.10* 1.03* 1.00 0.83  CALCIUM 9.0  --  8.3* 8.8* 9.1  MG  --   --   --  1.7 1.5*   GFR: Estimated Creatinine Clearance: 35.3 mL/min (by C-G formula based on SCr of 0.83 mg/dL). Liver Function Tests:  Recent Labs Lab 09/28/16 1034  AST 19  ALT 13*  ALKPHOS 115  BILITOT 0.7  PROT 6.3*  ALBUMIN 3.5    Recent Labs Lab 09/28/16 1034 09/28/16 1654  LIPASE 28 17   No results for input(s): AMMONIA in the last 168 hours. Coagulation Profile:  Recent Labs Lab 10/01/16 0902  INR 1.08   Cardiac Enzymes:  Recent Labs Lab 09/30/16 1403 09/30/16 2005 10/01/16 0048 10/01/16 0553 10/01/16 1102  TROPONINI 0.06* 0.31* 0.42* 0.84* 1.37*   BNP  (last 3 results)  Recent Labs  02/16/16 1346  PROBNP 312.0*   HbA1C: No results for input(s): HGBA1C in the last 72 hours. CBG:  Recent Labs Lab 10/01/16 0841  GLUCAP 77   Lipid Profile: No results for input(s): CHOL, HDL, LDLCALC, TRIG, CHOLHDL, LDLDIRECT in the last 72 hours. Thyroid Function Tests: No results for input(s): TSH, T4TOTAL, FREET4, T3FREE, THYROIDAB in the last 72 hours. Anemia Panel: No results for input(s): VITAMINB12, FOLATE, FERRITIN, TIBC, IRON, RETICCTPCT in the last 72 hours. Sepsis Labs:  Recent Labs Lab 09/28/16 1044  LATICACIDVEN 0.81    Recent Results (from the past 240 hour(s))  Gastrointestinal Panel by PCR , Stool     Status: Abnormal   Collection Time: 09/28/16  3:20 PM  Result Value Ref Range Status   Campylobacter species NOT DETECTED NOT DETECTED Final   Plesimonas shigelloides NOT DETECTED NOT DETECTED Final   Salmonella species NOT DETECTED NOT DETECTED Final   Yersinia enterocolitica NOT DETECTED NOT DETECTED Final   Vibrio species NOT DETECTED NOT DETECTED Final   Vibrio cholerae NOT DETECTED NOT DETECTED Final   Enteroaggregative E coli (EAEC) NOT DETECTED NOT DETECTED Final   Enteropathogenic E coli (EPEC) NOT DETECTED NOT DETECTED Final   Enterotoxigenic E coli (ETEC) NOT DETECTED NOT DETECTED Final   Shiga like toxin producing E coli (STEC) NOT DETECTED NOT DETECTED Final   Shigella/Enteroinvasive E coli (EIEC) NOT DETECTED NOT DETECTED Final   Cryptosporidium NOT DETECTED NOT DETECTED Final   Cyclospora cayetanensis NOT DETECTED NOT DETECTED Final   Entamoeba histolytica NOT DETECTED NOT DETECTED Final   Giardia lamblia NOT DETECTED NOT DETECTED Final   Adenovirus F40/41 NOT DETECTED NOT DETECTED Final   Astrovirus NOT DETECTED NOT DETECTED Final   Norovirus GI/GII DETECTED (A) NOT DETECTED Final    Comment: CRITICAL RESULT CALLED TO, READ BACK BY AND VERIFIED WITH: KATIE SILK AT 1548 ON 09/29/16 BY SNJ    Rotavirus A  NOT DETECTED NOT DETECTED Final   Sapovirus (I, II, IV, and V) NOT DETECTED NOT DETECTED Final  C difficile quick scan w PCR reflex     Status: Abnormal   Collection Time: 09/28/16  3:20 PM  Result Value Ref Range Status   C Diff antigen POSITIVE (A) NEGATIVE Final   C Diff toxin NEGATIVE NEGATIVE Final   C Diff interpretation Results are indeterminate. See PCR results.  Final  Clostridium Difficile by PCR     Status: Abnormal   Collection Time: 09/28/16  3:20 PM  Result Value Ref Range Status   Toxigenic C Difficile by pcr POSITIVE (A) NEGATIVE Final    Comment: Positive for toxigenic C. difficile with little to no toxin production. Only treat if clinical presentation suggests symptomatic illness.    Radiology Studies: Dg Abd 1 View  Result Date: 09/30/2016 CLINICAL DATA:  Abdominal pain. EXAM: ABDOMEN - 1 VIEW COMPARISON:  September 29, 2016 FINDINGS: Nonobstructive bowel gas pattern. Air-filled nondilated loops of bowel seen diffusely throughout the abdomen. No other interval changes or acute abnormalities. IMPRESSION: Numerous air-filled nondilated loops of bowel could represent mild ileus. No evidence of obstruction. Electronically Signed   By: Gerome Samavid  Williams III M.D   On: 09/30/2016 16:17   Dg Chest Port 1 View  Result Date: 09/30/2016 CLINICAL DATA:  Intermittent upper chest pain with activity, accompanied by dyspnea. EXAM: PORTABLE CHEST 1 VIEW COMPARISON:  09/28/2016 FINDINGS: Shallow inspiration. Mild linear atelectatic appearing opacities in the bases, accentuated by the shallow degree of inspiration. No confluent consolidation. No large effusion. Normal pulmonary vasculature. IMPRESSION: Mild linear lung base opacities, accentuated by a shallow degree of inspiration. No consolidation or large effusion. Electronically Signed   By: Ellery Plunkaniel R Mitchell M.D.   On: 09/30/2016 00:57   Scheduled Meds: . carvedilol  3.125 mg Oral BID WC  . cefTRIAXone (ROCEPHIN)  IV  1 g Intravenous Q24H  .  hydrALAZINE  5 mg Intravenous Q6H  . isosorbide mononitrate  30 mg Oral Daily  .  morphine injection  1 mg Intravenous Once   Continuous Infusions: . 0.9 % NaCl with KCl 20 mEq / L 75 mL/hr at 10/01/16 0830  . heparin 550 Units/hr (10/01/16 0932)    LOS: 3 days   Time  spent: 35 min  Hazeline Junker, MD [facility] Pager [phone]  If 7PM-7AM, please contact night-coverage [email or web address] Password [email or web address] 10/01/2016, 5:35 PM

## 2016-10-01 NOTE — Progress Notes (Signed)
PT Cancellation Note  Patient Details Name: Erin Gibson MRN: [id number] DOB: 09/08/1919   Cancelled Treatment:     per RN acute chest pains and elevated Troponin.  Will hold till after Cardiology Consult.   Felecia ShellingLori Mechele Kittleson  PTA [facility]  Acute  Rehab Pager      [phone]

## 2016-10-01 NOTE — [place] FL2 (Signed)
[employer] MEDICA[place] FL2 LEVEL OF CARE SCREENING TOOL     IDENTIFICATION  Patient Name: Erin Gibson Birthdate: 09/06/1919 Sex: female Admission Date (Current Location): 09/28/2016  [facility]   Facility and Address:  [facility],  [address], [place] [id number]      Provider Number: [id number]  Attending Physician Name and Address:  Tyrone Nineyan B Grunz, MD  Relative Name and Phone Number:       Current Level of Care: Hospital Recommended Level of Care: Skilled Nursing Facility Prior Approval Number:    Date Approved/Denied:   PASRR Number: [id number]  Discharge Plan: SNF    Current Diagnoses: Patient Active Problem List   Diagnosis Date Noted  . Ileus (HCC)   . Norovirus   . Pulmonary hypertension   . Small bowel obstruction 09/28/2016  . Protein-calorie malnutrition, severe 06/06/2016  . Moderate aortic stenosis 05/22/2016  . Chronic venous insufficiency 04/20/2016  . Chest pain 01/01/2016  . Fall 12/25/2015  . Elevated troponin 12/25/2015  . Scalp laceration 12/25/2015  . Hyperkalemia 12/25/2015  . UTI (lower urinary tract infection) 12/25/2015  . Odynophagia 07/23/2015  . CKD (chronic kidney disease), stage III 07/23/2015  . Faintness   . UTI (urinary tract infection) 07/22/2015  . Abdominal pain 07/22/2015  . Nausea with vomiting 07/22/2015  . Syncope 07/21/2015  . Abnormality of gait 01/22/2014  . SBO (small bowel obstruction) (HCC) 03/09/2013  . Inguinal hernia, left - Contains sigmoid colon.  Reducible 03/09/2013  . Anemia 03/31/2009  . VENOUS STASIS ULCER 07/20/2008  . Osteoarthritis 05/06/2007  . Essential hypertension 04/24/2007  . GERD 04/24/2007    Orientation RESPIRATION BLADDER Height & Weight     Self, Time, Situation, Place  O2 (2L) Continent Weight: 168 lb 14.4 oz (76.6 kg) Height:  4\' 10"  (147.3 cm)  BEHAVIORAL SYMPTOMS/MOOD NEUROLOGICAL BOWEL NUTRITION STATUS      Continent Diet (Soft)   AMBULATORY STATUS COMMUNICATION OF NEEDS Skin   Extensive Assist Verbally Normal                       Personal Care Assistance Level of Assistance  Bathing, Dressing Bathing Assistance: Limited assistance   Dressing Assistance: Limited assistance     Functional Limitations Info             SPECIAL CARE FACTORS FREQUENCY  PT (By licensed PT), OT (By licensed OT)     PT Frequency: 5 OT Frequency: 5            Contractures      Additional Factors Info  Code Status, Allergies Code Status Info: DNR Allergies Info: Tramadol, Codeine, Ibuprofen, Diltiazem Hcl           Current Medications (10/01/2016):  This is the current hospital active medication list Current Facility-Administered Medications  Medication Dose Route Frequency Provider Last Rate Last Dose  . 0.9 % NaCl with KCl 20 mEq/ L  infusion   Intravenous Continuous Tyrone Nineyan B Grunz, MD 75 mL/hr at 10/01/16 0830    . acetaminophen (TYLENOL) tablet 650 mg  650 mg Oral Q6H PRN Catarina Hartshornavid Tat, MD   650 mg at 10/01/16 [id number]   Or  . acetaminophen (TYLENOL) suppository 650 mg  650 mg Rectal Q6H PRN Catarina Hartshornavid Tat, MD      . cefTRIAXone (ROCEPHIN) 1 g in dextrose 5 % 50 mL IVPB  1 g Intravenous Q24H Catarina Hartshornavid Tat, MD   1 g at 10/01/16 1303  .  heparin ADULT infusion 100 units/mL (25000 units/284mL sodium chloride 0.45%)  550 Units/hr Intravenous Continuous Winfield Rast, RPH 5.5 mL/hr at 10/01/16 0932 550 Units/hr at 10/01/16 0932  . hydrALAZINE (APRESOLINE) injection 5 mg  5 mg Intravenous Q6H Drema Dallas, MD   5 mg at 10/01/16 1302  . isosorbide mononitrate (IMDUR) 24 hr tablet 30 mg  30 mg Oral Daily Leone Brand, NP   30 mg at 10/01/16 1302  . metoprolol (LOPRESSOR) injection 2.5 mg  2.5 mg Intravenous Q8H Drema Dallas, MD   2.5 mg at 10/01/16 1302  . morphine 2 MG/ML injection 1 mg  1 mg Intravenous Q4H PRN Catarina Hartshorn, MD      . morphine 2 MG/ML injection 1 mg  1 mg Intravenous Once Jinger Neighbors, NP      . nitroGLYCERIN  (NITROSTAT) SL tablet 0.4 mg  0.4 mg Sublingual Q5 min PRN Jinger Neighbors, NP   0.4 mg at 10/01/16 0829  . ondansetron (ZOFRAN) tablet 4 mg  4 mg Oral Q6H PRN Catarina Hartshorn, MD       Or  . ondansetron (ZOFRAN) injection 4 mg  4 mg Intravenous Q6H PRN Catarina Hartshorn, MD      . polyvinyl alcohol (LIQUIFILM TEARS) 1.4 % ophthalmic solution 1 drop  1 drop Both Eyes PRN Tyrone Nine, MD         Discharge Medications: Please see discharge summary for a list of discharge medications.  Relevant Imaging Results:  Relevant Lab Results:   Additional Information SSN: [id number]  Arlyss Repress, LCSW

## 2016-10-01 NOTE — Progress Notes (Signed)
CSW spoke with patient & daughter, Dondra SpryGail re: discharge planning - PT had recommended SNF;Supervision/assistance - 24 hour. Patient states that she has been to SNF 5 times in the past but would prefer to return home if possible. Patient states that she has private caregivers through [employer] agency from 10am-2pm, daughter states that she will get in touch with them to extend the hours to 24 hours. Daughter to call CSW back to confirm discharge plans. RNCM, Olegario MessierKathy made aware.    Lincoln MaxinKelly Colene Mines, LCSW [facility] Clinical Social Worker cell #: [phone]

## 2016-10-01 NOTE — Progress Notes (Signed)
Cardiology NP Nada BoozerLaura Ingold made aware of Troponin increase to 1.37. Heparin gtt continues to infuse at 5.5 ml/hr. Pt resting in bed, states chest pain has improved. No new orders at this time.

## 2016-10-01 NOTE — Clinical Social Work Placement (Signed)
CSW spoke with patient who states that she has no decided to go to SNF first before returning home with caregivers. Patient requesting [facility], CSW confirmed with Paula ComptonKarla at [facility] that they would be able to take patient when ready.    Lincoln MaxinKelly Sekai Nayak, LCSW [facility] Clinical Social Worker cell #: [phone]    CLINICAL SOCIAL WORK PLACEMENT  NOTE  Date:  10/01/2016  Patient Details  Name: Erin Gibson MRN: [id number] Date of Birth: 07/27/1919  Clinical Social Work is seeking post-discharge placement for this patient at the Skilled  Nursing Facility level of care (*CSW will initial, date and re-position this form in  chart as items are completed):  Yes   Patient/family provided with [facility] Clinical Social Work Department's list of facilities offering this level of care within the geographic area requested by the patient (or if unable, by the patient's family).  Yes   Patient/family informed of their freedom to choose among providers that offer the needed level of care, that participate in Medicare, Medicaid or managed care program needed by the patient, have an available bed and are willing to accept the patient.  Yes   Patient/family informed of [facility]'s ownership interest in [facility] and [facility], as well as of the fact that they are under no obligation to receive care at these facilities.  PASRR submitted to EDS on       PASRR number received on       Existing PASRR number confirmed on 10/01/16     FL2 transmitted to all facilities in geographic area requested by pt/family on 10/01/16     FL2 transmitted to all facilities within larger geographic area on       Patient informed that his/her managed care company has contracts with or will negotiate with certain facilities, including the following:        Yes   Patient/family informed of bed offers received.  [facility]     Physician recommends and  patient chooses bed at      Patient to be transferred to [facility] on  .  Patient to be transferred to facility by       Patient family notified on   of transfer.  Name of family member notified:        PHYSICIAN       Additional Comment:    _______________________________________________ Arlyss RepressHarrison, Francisco Ostrovsky F, LCSW 10/01/2016, 1:25 PM

## 2016-10-02 DIAGNOSIS — I208 Other forms of angina pectoris: Secondary | ICD-10-CM

## 2016-10-02 LAB — CBC
HEMATOCRIT: 34 % — AB (ref 36.0–46.0)
HEMOGLOBIN: 11.1 g/dL — AB (ref 12.0–15.0)
MCH: 28 pg (ref 26.0–34.0)
MCHC: 32.6 g/dL (ref 30.0–36.0)
MCV: 85.9 fL (ref 78.0–100.0)
Platelets: 189 10*3/uL (ref 150–400)
RBC: 3.96 MIL/uL (ref 3.87–5.11)
RDW: 14.9 % (ref 11.5–15.5)
WBC: 5.5 10*3/uL (ref 4.0–10.5)

## 2016-10-02 LAB — HEPARIN LEVEL (UNFRACTIONATED)
HEPARIN UNFRACTIONATED: 0.48 [IU]/mL (ref 0.30–0.70)
Heparin Unfractionated: 0.21 IU/mL — ABNORMAL LOW (ref 0.30–0.70)
Heparin Unfractionated: 0.47 IU/mL (ref 0.30–0.70)

## 2016-10-02 LAB — BASIC METABOLIC PANEL
Anion gap: 7 (ref 5–15)
BUN: 8 mg/dL (ref 6–20)
CHLORIDE: 116 mmol/L — AB (ref 101–111)
CO2: 18 mmol/L — AB (ref 22–32)
Calcium: 8.5 mg/dL — ABNORMAL LOW (ref 8.9–10.3)
Creatinine, Ser: 0.67 mg/dL (ref 0.44–1.00)
GFR calc Af Amer: >60 mL/min (ref >60)
GFR calc non Af Amer: >60 mL/min (ref >60)
GLUCOSE: 90 mg/dL (ref 65–99)
POTASSIUM: 3.5 mmol/L (ref 3.5–5.1)
Sodium: 141 mmol/L (ref 135–145)

## 2016-10-02 LAB — MAGNESIUM: MAGNESIUM: 1.4 mg/dL — AB (ref 1.7–2.4)

## 2016-10-02 LAB — URINE CULTURE: CULTURE: NO GROWTH

## 2016-10-02 LAB — TROPONIN I: Troponin I: 0.9 ng/mL (ref <0.03)

## 2016-10-02 MED ORDER — ISOSORBIDE MONONITRATE ER 60 MG PO TB24
60.0000 mg | ORAL_TABLET | Freq: Every day | ORAL | Status: DC
  Administered 2016-10-02 – 2016-10-05 (×4): 60 mg via ORAL
  Filled 2016-10-02 (×4): qty 1

## 2016-10-02 MED ORDER — HYDRALAZINE HCL 20 MG/ML IJ SOLN: 5.0000 mg | Freq: Four times a day (QID) | INTRAMUSCULAR | Status: DC | PRN

## 2016-10-02 NOTE — Progress Notes (Signed)
Patient Name: Erin Gibson Date of Encounter: 10/02/2016  Primary Cardiologist: Dr. Kristeen MissHarding  Hospital Problem List     Active Problems:   Essential hypertension   SBO (small bowel obstruction) (HCC)   UTI (urinary tract infection)   Nausea with vomiting   CKD (chronic kidney disease), stage III   Protein-calorie malnutrition, severe   Small bowel obstruction   Ileus (HCC)   Norovirus   Pulmonary hypertension   NSTEMI (non-ST elevated myocardial infarction) (HCC)   Congestive dilated cardiomyopathy [facility](HCC)     Patient Profile     [age] yoF (birthday today) with no hx of CAD, was seen in 12/2015 for  Elevated troponin of 0.29  She had no chest pain at that time and EKG was stable.  Echo at that time with EF 60-65%,  Mild LVH G1DD,  Moderate AS, AVA around 1.1 cm2. , LA was moderately dilated.  Mod TR and PA pressure 59 MMHg.   Now admitted with ileus but with elevated troponin- and decrease in EF to 35% with akinesis of the apical of LV with aneurysmal dilatation of the distal inf/inf septal walls.  G1DD. Mod AS   Subjective   Still with chest pain and SOB with activity but none at rest.  Inpatient Medications    Scheduled Meds: . carvedilol  3.125 mg Oral BID WC  . cefTRIAXone (ROCEPHIN)  IV  1 g Intravenous Q24H  . hydrALAZINE  5 mg Intravenous Q6H  . isosorbide mononitrate  30 mg Oral Daily  .  morphine injection  1 mg Intravenous Once   Continuous Infusions: . 0.9 % NaCl with KCl 20 mEq / L 75 mL/hr at 10/02/16 0525  . heparin 850 Units/hr (10/02/16 0525)   PRN Meds: acetaminophen **OR** acetaminophen, morphine injection, nitroGLYCERIN, ondansetron **OR** ondansetron (ZOFRAN) IV, polyvinyl alcohol   Vital Signs    Vitals:   10/01/16 1702 10/01/16 1800 10/01/16 2001 10/02/16 0529  BP: 139/83 (!) 147/79 126/81 128/67  Pulse: 79 76 84 88  Resp:   18 16  Temp:   97.6 F (36.4 C) 97.4 F (36.3 C)  TempSrc:   Oral Oral  SpO2:   98% 95%  Weight:      Height:          Intake/Output Summary (Last 24 hours) at 10/02/16 0751 Last data filed at 10/01/16 2200  Gross per 24 hour  Intake          1905.95 ml  Output              375 ml  Net          1530.95 ml   Filed Weights   09/28/16 1014 10/01/16 0118 10/01/16 0454  Weight: 107 lb (48.5 kg) 103 lb 13.4 oz (47.1 kg) 168 lb 14.4 oz (76.6 kg)    Physical Exam    GEN: Well nourished,  in no acute distress.  HEENT: normocephalic, sclera clear, mucus membranes moist.  Neck: Supple, + JVD, no masses. Cardiac: RRR, 2/6 systolic murmurs, no rubs, or gallops. No clubbing, cyanosis, edema.  Radials/DP/PT 2+ and equal bilaterally.  Respiratory:  Respirations regular and unlabored,   bilateral auscultation with few rales, no rhonchi or wheezes. GI: Abd -Soft, nontender, nondistended, BS + x 4. MS: no deformity or atrophy. Skin: warm and dry, brisk capillary refill, no obvious rash Neuro:  Alert and oriented X 3 MAE, follows commands Psych: answers questions appropriately,Normal and pleasant affect.   Labs    CBC  Recent Labs  10/01/16 0902 10/02/16 0253  WBC 5.0 5.5  HGB 13.0 11.1*  HCT 40.9 34.0*  MCV 87.2 85.9  PLT 212 189   Basic Metabolic Panel  Recent Labs  10/01/16 0553 10/02/16 0253  NA 142 141  K 3.8 3.5  CL 112* 116*  CO2 17* 18*  GLUCOSE 64* 90  BUN 10 8  CREATININE 0.83 0.67  CALCIUM 9.1 8.5*  MG 1.5* 1.4*   Liver Function Tests No results for input(s): AST, ALT, ALKPHOS, BILITOT, PROT, ALBUMIN in the last 72 hours. No results for input(s): LIPASE, AMYLASE in the last 72 hours. Cardiac Enzymes  Recent Labs  10/01/16 1102 10/01/16 1719 10/01/16 2310  TROPONINI 1.37* 1.15* 0.90*   BNP Invalid input(s): POCBNP D-Dimer No results for input(s): DDIMER in the last 72 hours. Hemoglobin A1C No results for input(s): HGBA1C in the last 72 hours. Fasting Lipid Panel No results for input(s): CHOL, HDL, LDLCALC, TRIG, CHOLHDL, LDLDIRECT in the last 72 hours. Thyroid  Function Tests No results for input(s): TSH, T4TOTAL, T3FREE, THYROIDAB in the last 72 hours.  Invalid input(s): FREET3  Telemetry    SR with PVCs - Personally Reviewed  ECG    SR -old Q waves in ant. Leads no acute changes + [facility], [place] 10/01/16  Personally Reviewed  Radiology    Dg Abd 1 View  Result Date: 09/30/2016 CLINICAL DATA:  Abdominal pain. EXAM: ABDOMEN - 1 VIEW COMPARISON:  September 29, 2016 FINDINGS: Nonobstructive bowel gas pattern. Air-filled nondilated loops of bowel seen diffusely throughout the abdomen. No other interval changes or acute abnormalities. IMPRESSION: Numerous air-filled nondilated loops of bowel could represent mild ileus. No evidence of obstruction. Electronically Signed   By: Gerome Sam III M.D   On: 09/30/2016 16:17    Cardiac Studies   Echo  10/01/16 Study Conclusions  - Left ventricle: LVEF is approximsately 35% with akinesis of the   apical of LV with aneurysmal dilitation of the distal   inferior/inferoseptal walls. SInce echo of APril 2017, these   changes are new. The cavity size was normal. Wall thickness was   normal. Doppler parameters are consistent with abnormal left   ventricular relaxation (grade 1 diastolic dysfunction). - Aortic valve: AV is thickened, calcified with restricted motion   Peak and mean gradients through the valve are 32 and 17 mm Hg   respectively This is consistent with mild AS. 2D imaging suggests   that AS is more moderate is severity Gradient may be dow because   of depressed LVEF There was trivial regurgitation. - Tricuspid valve: There was mild-moderate regurgitation. - Pulmonary arteries: PA peak pressure: 44 mm Hg (S).  Assessment & Plan    NSTEMI but EF is out of proportion to what troponins would reveal.  This may be more C/W takotsubo CM.  Dr. Herbie Baltimore discussed with pt and she does not want any invasive procedures.  Will plan medical management.  Heparin for 48 hours total.  We added Imdur back yesterday.   Also low dose BB with coreg.  We held off on statin with minimal long term benefit and + Noro virus and C. Diff..  pk troponin 1.37  --chest pain continues with exertion and DOE will increase imdur.  Cardiomyopathy monitor volume on BB hold ARB/ACE for now    Norovirus per IM  (C-Diff r/o)   Signed, Nada Boozer, NP  10/02/2016, 7:51 AM  [facility] Pager [phone]  After 5 or weekends [phone]  I have seen, examined and evaluated the patient this PM along with Nada Boozer, NP.  After reviewing all the available data and chart, we discussed the patients laboratory, study & physical findings as well as symptoms in detail. I agree with her findings, examination as well as impression recommendations as per our discussion.    Continue heparin for total 48 hours for what is possibly a non-STEMI.she does have exertional chest tightness concerning for angina, but has declined any invasive evaluation for ischemia. I do agree with the plan to increase up Imdur dose. Continue beta blocker  If we are unable to get her symptom-free, may need to consider Ranexa.  At this point, she remains extremely weak and will probably need short-term rehabilitation. This will allow [place] further titrate antianginal medications.  She will need follow-up echocardiogram in a few months to see if there is any return of function as I suspect that the significant wall motion abnormality is probably not related to this mild troponin elevation, but more consistent with stress-induced cardiomyopathy.   Bryan Lemma, M.D., M.S. Interventional Cardiologist   Pager # [phone] Phone # [phone] [address] [place], [place] [address]

## 2016-10-02 NOTE — Progress Notes (Signed)
PROGRESS NOTE  Erin CirriViolet W Gibson  ZOX:[id number] DOB: 08/12/1919 [name]: 09/28/2016 PCP: [name],[name], MD   Brief Narrative:  83 y.o.WF PMHx CKD stage III, HTN, multiple abdominal surgeries, recurrent SBO  Presents to the ED for evaluation of abdominal pain, nausea, and non-bloody vomiting. The patient says that she has had 2 weeks of abdominal pain, but it has worsened in the past 24 hours with numerous episodes of emesis on the night prior to admission. In addition, the patient had 3 loose stools on the evening of 09/27/2016. She complains of chronic dysuria. She states that she was last on antibiotics around November 2017 for a UTI. She denies any sick contacts. The patient describes her pain as severe and crampy and bandlike across the lower abdomen without any alleviating factors. She complains of some shortness of breath for the last several weeks which has been unchanged, but denies any chest discomfort, hemoptysis, coughing. The patient was recently discharged from the hospital after a stay from 08/05/2016 through 08/10/2016 at which time she was treated for small bowel obstruction. It resolved with medical management. She was also treated for UTI during that period of time.  In the emergency department, the patient was afebrile and hemodynamically stable with oxygen saturation 96-98% on room air. BMP and CBC were unremarkable. LFTs were unremarkable. Urinalysis is negative for pyuria but positive for nitrites. CT of the abdomen and pelvis showed mildly distended fluid-filled loops of small bowel with no transition point with concerns of enteritis versus partial small bowel obstruction. There was also liquid stool noted in the colon. Chest x-ray was negative for any acute findings. She was admitted and was found to have norovirus gastroenteritis. With conservative management, obstruction resolved with contrast to the left colon on AXR 1/6. Diet was advanced. Overnight into 1/8 she experienced  acute exertional substernal chest pain, and cardiac enzymes trended upward. Heparin was started. ECG showed changes in repolarization and echocardiogram showed new reduced EF with apical akinesis and inferoseptal aneurysmal dilatation. Cardiology was consulted and, after long discussion with the patient, recommend a medical plan of care. Heparin is to be continued 48 hrs prior to discharge to SNF.  Subjective: Unchanged chest pressure and dyspnea with minimal exertion. No symptoms at rest. Had neck ache related to position in bedside chair this AM.   Assessment & Plan:   Active Problems:   Essential hypertension   SBO (small bowel obstruction) (HCC)   UTI (urinary tract infection)   Nausea with vomiting   CKD (chronic kidney disease), stage III   Protein-calorie malnutrition, severe   Small bowel obstruction   Ileus (HCC)   Norovirus   Pulmonary hypertension   NSTEMI (non-ST elevated myocardial infarction) (HCC)   Congestive dilated cardiomyopathy (HCC)  Partial small bowel obstruction/ileus: Resolved. 1/7 KUB shows some loops of small bowel without obstruction.  - General surgery signed off - Advance diet >> soft  Norovirus gastroenteritis C. diff ( antigen +, toxin negative), not consistent with active C. difficile infection. Off antibiotics. Flu PCR neg. - Enteric precautions - Monitor symptoms, continue IVF while having diarrhea.  Recurrent UTI/Dysuria: With chronic bladder prolapse. - UA appeared infected at admission with +nitrites but no culture sent. - Will complete 5 days ceftriaxone empirically.  - Urine culture was sent this morning? Will follow up.. - Follow up with urology as outpatient  NSTEMI: With large anterior WMA on echocardiogram 1/8 but clinically stable. Troponins trending back downward. - Appreciate cardiology input. Suspicious for Takotsubo? - Continue heparin  gtt x48 hrs per cardiology rec's. No invasive intervention planned after full discussion with  the patient.  - Imdur added, titrating upward - Still symptomatic, cardiology considering ranexa - Titrate coreg based on HR/BP - No plan to start statin or ACE/ARB  - Continue O2 prn, morphine and NTG prn chest pain  Cervical strain: Positional, no indication for XR. - Mobilize - Kpad - analgesics as ordered  Pulmonary hypertension  CKD stage III: Baseline creatinine 1.0-1.1 At baseline. - Monitor  Depression/anxiety - Continue clonazepam and SSRI. Discussed risks/benefits of this with patient especially Re: recurrent falls.   Recurrent falls: walks with a walker, lives by herself, has personal care giver coming in day time. - Plan to discharge to SNF per PT recommendations.   DVT prophylaxis: Heparin gtt to end 1/10, then prophylactic heparin Code Status: DO NOT RESUSCITATE Family Communication: None at bedside Disposition Plan: Discharge to SNF once no longer having significant diarrhea. Still on heparin gtt. Anticipate DC 1/10 or 1/11.  Consultants:  Cardiology  Procedures/Significant Events:  1/5 CT abdomen pelvis W contrast:-Mildly distended and fluid-filled small bowel throughout the Abdomen.  -Liquid stool noted throughout the colon. - Slight interval change in the appearance of the left lower quadrant inguinal hernia which now contains a portion of the anterior wall of the adjacent sigmoid colon. However, there is no associated inflammation or evidence of obstruction related to this mild change. -Fusiform infrarenal abdominal aortic aneurysm measures 3.2 cm, unchanged.  1/7 WGN:FAOZHYQM air-filled nondilated loops of bowel could represent mild ileus  ECHOCARDIOGRAM 10/01/2016: Study Conclusions - Left ventricle: LVEF is approximately 35% with akinesis of the   apical of LV with aneurysmal dilitation of the distal   inferior/inferoseptal walls. SInce echo of APril 2017, these   changes are new. The cavity size was normal. Wall thickness was   normal. Doppler  parameters are consistent with abnormal left   ventricular relaxation (grade 1 diastolic dysfunction). - Aortic valve: AV is thickened, calcified with restricted motion   Peak and mean gradients through the valve are 32 and 17 mm Hg   respectively This is consistent with mild AS. 2D imaging suggests   that AS is more moderate is severity Gradient may be dow because   of depressed LVEF There was trivial regurgitation. - Tricuspid valve: There was mild-moderate regurgitation. - Pulmonary arteries: PA peak pressure: 44 mm Hg (S).  Cultures 1/5 C. difficile positive antigen: Negative toxin 1/5 GI panel positive Norovirus 1/8 urine culture pending  Continuous Infusions: . 0.9 % NaCl with KCl 20 mEq / L 75 mL/hr at 10/02/16 1750  . heparin 850 Units/hr (10/02/16 0525)    Objective: Vitals:   10/01/16 2001 10/02/16 0529 10/02/16 1208 10/02/16 1317  BP: 126/81 128/67 136/62 (!) 115/57  Pulse: 84 88 78 79  Resp: 18 16  20   Temp: 97.6 F (36.4 C) 97.4 F (36.3 C)  97.8 F (36.6 C)  TempSrc: Oral Oral  Oral  SpO2: 98% 95%  97%  Weight:      Height:        Intake/Output Summary (Last 24 hours) at 10/02/16 1953 Last data filed at 10/02/16 1750  Gross per 24 hour  Intake           536.38 ml  Output              300 ml  Net           236.38 ml   Filed Weights   09/28/16  1014 10/01/16 0118 10/01/16 0454  Weight: 48.5 kg (107 lb) 47.1 kg (103 lb 13.4 oz) 76.6 kg (168 lb 14.4 oz)    Examination:  General: Pleasant, active, elderly female in no distress Eyes: negative scleral hemorrhage, negative anisocoria, negative icterus ENT: Negative Runny nose, negative gingival bleeding, Neck:  Negative scars, masses, torticollis, lymphadenopathy, JVD Lungs: Clear to auscultation bilaterally without wheezes or crackles Cardiovascular: Tachycardic, Regular rhythm without murmur gallop or rub normal S1 and S2. No JVD or pedal edema. Abdomen: Soft, not tender to palpation. Hypoactive bowel  sounds. Nondistended.  Extremities: No significant cyanosis, clubbing, or edema bilateral lower extremities Skin: Negative rashes, lesions, ulcers Psychiatric:  Negative depression, negative anxiety, negative fatigue, negative mania  Central nervous system:  Cranial nerves II through XII intact, tongue/uvula midline, all extremities muscle strength 5/5, sensation intact throughout,  negative dysarthria, negative expressive aphasia, negative receptive aphasia.  Data Reviewed: Care during the described time interval was provided by me .  I have reviewed this patient's available data, including medical history, events of note, physical examination, and all test results as part of my evaluation.  I have personally reviewed and interpreted all radiology studies.  CBC:  Recent Labs Lab 09/28/16 1034 09/28/16 1044 09/29/16 0312 09/30/16 0150 10/01/16 0902 10/02/16 0253  WBC 6.6  --  3.4* 3.0* 5.0 5.5  NEUTROABS 6.3  --   --   --   --   --   HGB 12.2 13.3 10.4* 11.4* 13.0 11.1*  HCT 37.9 39.0 32.4* 35.6* 40.9 34.0*  MCV 87.5  --  89.3 89.4 87.2 85.9  PLT 233  --  179 196 212 189   Basic Metabolic Panel:  Recent Labs Lab 09/28/16 1034 09/28/16 1044 09/29/16 0312 09/30/16 0150 10/01/16 0553 10/02/16 0253  NA 141 145 141 142 142 141  K 3.6 3.7 3.5 4.0 3.8 3.5  CL 110 111 114* 117* 112* 116*  CO2 22  --  21* 19* 17* 18*  GLUCOSE 159* 156* 79 83 64* 90  BUN 32* 31* 27* 21* 10 8  CREATININE 1.07* 1.10* 1.03* 1.00 0.83 0.67  CALCIUM 9.0  --  8.3* 8.8* 9.1 8.5*  MG  --   --   --  1.7 1.5* 1.4*   GFR: Estimated Creatinine Clearance: 35.8 mL/min (by C-G formula based on SCr of 0.67 mg/dL). Liver Function Tests:  Recent Labs Lab 09/28/16 1034  AST 19  ALT 13*  ALKPHOS 115  BILITOT 0.7  PROT 6.3*  ALBUMIN 3.5    Recent Labs Lab 09/28/16 1034 09/28/16 1654  LIPASE 28 17   No results for input(s): AMMONIA in the last 168 hours. Coagulation Profile:  Recent Labs Lab  10/01/16 0902  INR 1.08   Cardiac Enzymes:  Recent Labs Lab 10/01/16 0048 10/01/16 0553 10/01/16 1102 10/01/16 1719 10/01/16 2310  TROPONINI 0.42* 0.84* 1.37* 1.15* 0.90*   BNP (last 3 results)  Recent Labs  02/16/16 1346  PROBNP 312.0*   HbA1C: No results for input(s): HGBA1C in the last 72 hours. CBG:  Recent Labs Lab 10/01/16 0841  GLUCAP 77   Lipid Profile: No results for input(s): CHOL, HDL, LDLCALC, TRIG, CHOLHDL, LDLDIRECT in the last 72 hours. Thyroid Function Tests: No results for input(s): TSH, T4TOTAL, FREET4, T3FREE, THYROIDAB in the last 72 hours. Anemia Panel: No results for input(s): VITAMINB12, FOLATE, FERRITIN, TIBC, IRON, RETICCTPCT in the last 72 hours. Sepsis Labs:  Recent Labs Lab 09/28/16 1044  LATICACIDVEN 0.81    Recent  Results (from the past 240 hour(s))  Gastrointestinal Panel by PCR , Stool     Status: Abnormal   Collection Time: 09/28/16  3:20 PM  Result Value Ref Range Status   Campylobacter species NOT DETECTED NOT DETECTED Final   Plesimonas shigelloides NOT DETECTED NOT DETECTED Final   Salmonella species NOT DETECTED NOT DETECTED Final   Yersinia enterocolitica NOT DETECTED NOT DETECTED Final   Vibrio species NOT DETECTED NOT DETECTED Final   Vibrio cholerae NOT DETECTED NOT DETECTED Final   Enteroaggregative E coli (EAEC) NOT DETECTED NOT DETECTED Final   Enteropathogenic E coli (EPEC) NOT DETECTED NOT DETECTED Final   Enterotoxigenic E coli (ETEC) NOT DETECTED NOT DETECTED Final   Shiga like toxin producing E coli (STEC) NOT DETECTED NOT DETECTED Final   Shigella/Enteroinvasive E coli (EIEC) NOT DETECTED NOT DETECTED Final   Cryptosporidium NOT DETECTED NOT DETECTED Final   Cyclospora cayetanensis NOT DETECTED NOT DETECTED Final   Entamoeba histolytica NOT DETECTED NOT DETECTED Final   Giardia lamblia NOT DETECTED NOT DETECTED Final   Adenovirus F40/41 NOT DETECTED NOT DETECTED Final   Astrovirus NOT DETECTED NOT  DETECTED Final   Norovirus GI/GII DETECTED (A) NOT DETECTED Final    Comment: CRITICAL RESULT CALLED TO, READ BACK BY AND VERIFIED WITH: KATIE SILK AT 1548 ON 09/29/16 BY SNJ    Rotavirus A NOT DETECTED NOT DETECTED Final   Sapovirus (I, II, IV, and V) NOT DETECTED NOT DETECTED Final  C difficile quick scan w PCR reflex     Status: Abnormal   Collection Time: 09/28/16  3:20 PM  Result Value Ref Range Status   C Diff antigen POSITIVE (A) NEGATIVE Final   C Diff toxin NEGATIVE NEGATIVE Final   C Diff interpretation Results are indeterminate. See PCR results.  Final  Clostridium Difficile by PCR     Status: Abnormal   Collection Time: 09/28/16  3:20 PM  Result Value Ref Range Status   Toxigenic C Difficile by pcr POSITIVE (A) NEGATIVE Final    Comment: Positive for toxigenic C. difficile with little to no toxin production. Only treat if clinical presentation suggests symptomatic illness.  Culture, Urine     Status: None   Collection Time: 10/01/16  8:14 AM  Result Value Ref Range Status   Specimen Description URINE, CLEAN CATCH  Final   Special Requests NONE  Final   Culture NO GROWTH Performed at [facility]   Final   Report Status 10/02/2016 FINAL  Final    Radiology Studies: No results found. Scheduled Meds: . carvedilol  3.125 mg Oral BID WC  . cefTRIAXone (ROCEPHIN)  IV  1 g Intravenous Q24H  . isosorbide mononitrate  60 mg Oral Daily  .  morphine injection  1 mg Intravenous Once   Continuous Infusions: . 0.9 % NaCl with KCl 20 mEq / L 75 mL/hr at 10/02/16 1750  . heparin 850 Units/hr (10/02/16 0525)    LOS: 4 days   Time spent: 25 min  Hazeline Junker, MD [facility] Pager [phone]  If 7PM-7AM, please contact night-coverage [email or web address] Password [facility] 10/02/2016, 7:53 PM

## 2016-10-02 NOTE — Progress Notes (Signed)
ANTICOAGULATION CONSULT NOTE - Follow Up Consult  Pharmacy Consult for Heparin Indication: chest pain/ACS  Allergies  Allergen Reactions  . Tramadol Other (See Comments)    Auditory and visual hallucinations  . Codeine Nausea And Vomiting    Violently ill  . Ibuprofen Nausea And Vomiting  . Diltiazem Hcl Other (See Comments)    Not sure about this reaction    Patient Measurements: Height: 4\' 10"  (147.3 cm) Weight: 168 lb 14.4 oz (76.6 kg) IBW/kg (Calculated) : 40.9 Heparin Dosing Weight:   Vital Signs: Temp: 97.6 F (36.4 C) (01/08 2001) Temp Source: Oral (01/08 2001) BP: 126/81 (01/08 2001) Pulse Rate: 84 (01/08 2001)  Labs:  Recent Labs  09/30/16 0150  10/01/16 0553 10/01/16 0902 10/01/16 1102 10/01/16 1719 10/01/16 2310 10/02/16 0253  HGB 11.4*  --   --  13.0  --   --   --  11.1*  HCT 35.6*  --   --  40.9  --   --   --  34.0*  PLT 196  --   --  212  --   --   --  189  APTT  --   --   --  30  --   --   --   --   LABPROT  --   --   --  14.1  --   --   --   --   INR  --   --   --  1.08  --   --   --   --   HEPARINUNFRC  --   --   --   --   --  0.16*  --  0.21*  CREATININE 1.00  --  0.83  --   --   --   --  0.67  TROPONINI 0.03*  < > 0.84*  --  1.37* 1.15* 0.90*  --   < > = values in this interval not displayed.  Estimated Creatinine Clearance: 35.8 mL/min (by C-G formula based on SCr of 0.67 mg/dL).   Medications:  Infusions:  . 0.9 % NaCl with KCl 20 mEq / L 75 mL/hr at 10/01/16 1951  . heparin 750 Units/hr (10/01/16 1909)    Assessment: Patient with low heparin level again.  No heparin issues per RN.  Goal of Therapy:  Heparin level 0.3-0.7 units/ml Monitor platelets by anticoagulation protocol: Yes   Plan:  Increase heparin to 850 units/hr Recheck level at [name]  Erin Gibson, SweetserJulian Gibson 10/02/2016,5:00 AM

## 2016-10-02 NOTE — Care Management Important Message (Signed)
Important Message  Patient Details IM Letter given to Kathy/Case Manager to present to Patient Name: Erin Gibson MRN: [id number] Date of Birth: 10/19/1918   Medicare Important Message Given:  Yes    Caren MacadamFuller, Karter Hellmer 10/02/2016, 1:44 PMImportant Message  Patient Details  Name: Erin CirriViolet W Gibson MRN: [id number] Date of Birth: 11/16/1918   Medicare Important Message Given:  Yes    Caren MacadamFuller, Khamani Daniely 10/02/2016, 1:44 PM

## 2016-10-02 NOTE — Progress Notes (Signed)
ANTICOAGULATION CONSULT NOTE - Follow up Consult  Pharmacy Consult for Heparin Indication: chest pain/ACS  Allergies  Allergen Reactions  . Tramadol Other (See Comments)    Auditory and visual hallucinations  . Codeine Nausea And Vomiting    Violently ill  . Ibuprofen Nausea And Vomiting  . Diltiazem Hcl Other (See Comments)    Not sure about this reaction    Patient Measurements: Height: 4\' 10"  (147.3 cm) Weight: 168 lb 14.4 oz (76.6 kg) IBW/kg (Calculated) : 40.9 Heparin Dosing Weight: 48 kg  Vital Signs: Temp: 97.8 F (36.6 C) (01/09 1317) Temp Source: Oral (01/09 1317) BP: 115/57 (01/09 1317) Pulse Rate: 79 (01/09 1317)  Labs:  Recent Labs  09/30/16 0150  10/01/16 0553 10/01/16 0902 10/01/16 1102 10/01/16 1719 10/01/16 2310 10/02/16 0253  HGB 11.4*  --   --  13.0  --   --   --  11.1*  HCT 35.6*  --   --  40.9  --   --   --  34.0*  PLT 196  --   --  212  --   --   --  189  APTT  --   --   --  30  --   --   --   --   LABPROT  --   --   --  14.1  --   --   --   --   INR  --   --   --  1.08  --   --   --   --   HEPARINUNFRC  --   --   --   --   --  0.16*  --  0.21*  CREATININE 1.00  --  0.83  --   --   --   --  0.67  TROPONINI 0.03*  < > 0.84*  --  1.37* 1.15* 0.90*  --   < > = values in this interval not displayed.  Estimated Creatinine Clearance: 35.8 mL/min (by C-G formula based on SCr of 0.67 mg/dL).   Medical History: Past Medical History:  Diagnosis Date  . ANEMIA 03/31/2009  . Chronic kidney disease   . DEGENERATIVE JOINT DISEASE, GENERALIZED 05/06/2007  . Diverticulosis of sigmoid colon    Colonoscopy 2011  . DVT (deep venous thrombosis) (HCC)    hx of  . GERD 04/24/2007  . HYPERTENSION 04/24/2007  . VENOUS STASIS ULCER 07/20/2008    Medications:  Scheduled:  . carvedilol  3.125 mg Oral BID WC  . cefTRIAXone (ROCEPHIN)  IV  1 g Intravenous Q24H  . hydrALAZINE  5 mg Intravenous Q6H  . isosorbide mononitrate  60 mg Oral Daily  .  morphine  injection  1 mg Intravenous Once   Infusions:  . 0.9 % NaCl with KCl 20 mEq / L 75 mL/hr at 10/02/16 0525  . heparin 850 Units/hr (10/02/16 0525)    Assessment: 89 yoF admitted on 1/5 with abdominal pain/N/V, suspected SBO.  On 1/8 she c/o sudden chest pain when [facility] was raised, elevated troponin, but no EKG changes.  Pharmacy is consulted to dose Heparin for ACS. Last dose of prophylactic Lovenox given 1/7 PM.    Today, 10/02/2016:  Heparin level 0.48, therapeutic  CBC: Hgb 11.1 (decreased), Plt 189  No bleeding or complications reported  SCr 0.67 with CrCl ~ 35 ml/min   Goal of Therapy:  Heparin level 0.3-0.7 units/ml Monitor platelets by anticoagulation protocol: Yes   Plan:   Continue heparin IV infusion  at 850 units/hr  Heparin level in 8 hours to confirm level  Daily heparin level and CBC  Per cardiology and Dr. Jarvis NewcomerGrunz, plan to continue Heparin x 48 hours.  Stop time entered for 10/04/15 at 0930.   Lynann Beaverhristine Yarisbel Miranda PharmD, BCPS Pager [phone] 10/02/2016 2:53 PM

## 2016-10-02 NOTE — Progress Notes (Signed)
Physical Therapy Treatment Patient Details Name: Erin Gibson MRN: [id number] DOB: 05/02/1919 Today's Date: 10/02/2016    History of Present Illness 83 yo female admitted with SBO, N/V/D, (+) norovirus per MD progress note 1/6. Hx of HTN, CKD, recurrent SBO, arthritis, anemia.     PT Comments    [facility] then amb to sink for brief moment to wash hands.  Pt fatigues easily and required sitting rest breaks between each activity.  Only tolerated amb a total of 8 feet.    Follow Up Recommendations  SNF     Equipment Recommendations  None recommended by PT    Recommendations for Other Services       Precautions / Restrictions Precautions Precaution Comments: enteric Restrictions Weight Bearing Restrictions: No    Mobility  Bed Mobility Overal bed mobility: Needs Assistance Bed Mobility: Supine to Sit     Supine to sit: Min guard;Min assist     General bed mobility comments: increased time and use of rail  Transfers Overall transfer level: Needs assistance Equipment used: None Transfers: Sit to/from Stand Sit to Stand: Min assist;Mod assist         General transfer comment: increased time and use of hands to staedy self.  Poor kyphotic posture and limted B shoulder ROM.    Ambulation/Gait Ambulation/Gait assistance: Min assist;Mod assist Ambulation Distance (Feet): 8 Feet (4 feet x 2) Assistive device: Rolling walker (2 wheeled) Gait Pattern/deviations: Step-through pattern;Decreased stride length;Decreased step length - right;Decreased step length - left;Trunk flexed Gait velocity: decreased   General Gait Details: Assist to stabilize throughout ambulation. Pt fatigues eaily.  Poor posture.   HIGH FALL RISK   Stairs            Wheelchair Mobility    Modified Rankin (Stroke Patients Only)       Balance                                    Cognition Arousal/Alertness: Awake/alert Behavior During Therapy: WFL for tasks  assessed/performed Overall Cognitive Status: Within Functional Limits for tasks assessed                      Exercises      General Comments        Pertinent Vitals/Pain Faces Pain Scale: Hurts a little bit Pain Location: generalized pain Pain Descriptors / Indicators: Sore Pain Intervention(s): Monitored during session;Repositioned    Home Living                      Prior Function            PT Goals (current goals can now be found in the care plan section) Progress towards PT goals: Progressing toward goals    Frequency    Min 3X/week      PT Plan Current plan remains appropriate    Co-evaluation             End of Session Equipment Utilized During Treatment: Gait belt Activity Tolerance: Patient limited by fatigue Patient left: in chair;with call bell/phone within reach;with chair alarm set     Time: [id number] PT Time Calculation (min) (ACUTE ONLY): 24 min  Charges:  $Gait Training: 8-22 mins $Therapeutic Activity: 8-22 mins                    G Codes:  Rica Koyanagi  PTA [facility]  Acute  Rehab Pager      [phone]

## 2016-10-03 DIAGNOSIS — I272 Pulmonary hypertension, unspecified: Secondary | ICD-10-CM

## 2016-10-03 LAB — BASIC METABOLIC PANEL
ANION GAP: 7 (ref 5–15)
BUN: 7 mg/dL (ref 6–20)
CHLORIDE: 115 mmol/L — AB (ref 101–111)
CO2: 17 mmol/L — AB (ref 22–32)
Calcium: 8.9 mg/dL (ref 8.9–10.3)
Creatinine, Ser: 0.81 mg/dL (ref 0.44–1.00)
GFR calc Af Amer: >60 mL/min (ref >60)
GFR calc non Af Amer: 59 mL/min — ABNORMAL LOW (ref >60)
GLUCOSE: 82 mg/dL (ref 65–99)
POTASSIUM: 4 mmol/L (ref 3.5–5.1)
Sodium: 139 mmol/L (ref 135–145)

## 2016-10-03 LAB — CBC
HEMATOCRIT: 34.5 % — AB (ref 36.0–46.0)
HEMOGLOBIN: 11.1 g/dL — AB (ref 12.0–15.0)
MCH: 27.3 pg (ref 26.0–34.0)
MCHC: 32.2 g/dL (ref 30.0–36.0)
MCV: 85 fL (ref 78.0–100.0)
Platelets: 169 10*3/uL (ref 150–400)
RBC: 4.06 MIL/uL (ref 3.87–5.11)
RDW: 15 % (ref 11.5–15.5)
WBC: 4.4 10*3/uL (ref 4.0–10.5)

## 2016-10-03 LAB — MAGNESIUM: Magnesium: 1.4 mg/dL — ABNORMAL LOW (ref 1.7–2.4)

## 2016-10-03 MED ORDER — ASPIRIN EC 81 MG PO TBEC
81.0000 mg | DELAYED_RELEASE_TABLET | Freq: Every day | ORAL | Status: DC
  Administered 2016-10-03 – 2016-10-05 (×3): 81 mg via ORAL
  Filled 2016-10-03 (×3): qty 1

## 2016-10-03 MED ORDER — RANOLAZINE ER 500 MG PO TB12
500.0000 mg | ORAL_TABLET | Freq: Two times a day (BID) | ORAL | Status: DC
  Administered 2016-10-03 – 2016-10-05 (×5): 500 mg via ORAL
  Filled 2016-10-03 (×5): qty 1

## 2016-10-03 MED ORDER — MORPHINE SULFATE 10 MG/5ML PO SOLN
2.5000 mg | ORAL | Status: DC | PRN
  Administered 2016-10-04: 2.5 mg via ORAL
  Filled 2016-10-03: qty 5

## 2016-10-03 MED ORDER — CARVEDILOL 6.25 MG PO TABS: 6.2500 mg | ORAL_TABLET | Freq: Two times a day (BID) | ORAL | Status: DC

## 2016-10-03 MED ORDER — CARVEDILOL 6.25 MG PO TABS
6.2500 mg | ORAL_TABLET | Freq: Two times a day (BID) | ORAL | Status: DC
  Administered 2016-10-03 – 2016-10-05 (×5): 6.25 mg via ORAL
  Filled 2016-10-03 (×5): qty 1

## 2016-10-03 MED ORDER — MAGNESIUM SULFATE 2 GM/50ML IV SOLN
2.0000 g | Freq: Once | INTRAVENOUS | Status: AC
  Administered 2016-10-03: 2 g via INTRAVENOUS
  Filled 2016-10-03: qty 50

## 2016-10-03 NOTE — Progress Notes (Addendum)
Patient Name: Erin Gibson Date of Encounter: 10/03/2016  Primary Cardiologist: New (saw Dr. Gala RomneyBensimhon a year ago as regular hospital consult for elevated troponin not HF)  Hospital Problem List     Active Problems:   Essential hypertension   SBO (small bowel obstruction) (HCC)   UTI (urinary tract infection)   Nausea with vomiting   CKD (chronic kidney disease), stage III   Protein-calorie malnutrition, severe   Small bowel obstruction   Ileus (HCC)   Norovirus   Pulmonary hypertension   NSTEMI (non-ST elevated myocardial infarction) (HCC)   Congestive dilated cardiomyopathy (HCC)    Subjective   Continuing to have intermittent CP/SOB, none at present time.  Inpatient Medications    . carvedilol  6.25 mg Oral BID WC  . cefTRIAXone (ROCEPHIN)  IV  1 g Intravenous Q24H  . isosorbide mononitrate  60 mg Oral Daily  .  morphine injection  1 mg Intravenous Once  . ranolazine  500 mg Oral BID    Vital Signs    Vitals:   10/02/16 1317 10/02/16 2052 10/03/16 0457 10/03/16 0941  BP: (!) 115/57 127/66 (!) 161/90 (!) 160/73  Pulse: 79 72 74 72  Resp: 20 18 20    Temp: 97.8 F (36.6 C) 97.9 F (36.6 C) 98.2 F (36.8 C)   TempSrc: Oral Oral Oral   SpO2: 97% 96% 98%   Weight:      Height:        Intake/Output Summary (Last 24 hours) at 10/03/16 1251 Last data filed at 10/03/16 0900  Gross per 24 hour  Intake           3194.6 ml  Output              800 ml  Net           2394.6 ml   Filed Weights   09/28/16 1014 10/01/16 0118 10/01/16 0454  Weight: 107 lb (48.5 kg) 103 lb 13.4 oz (47.1 kg) 168 lb 14.4 oz (76.6 kg)    Physical Exam    General: Elderly WF in no acute distress. HEENT: Normocephalic, atraumatic, sclera non-icteric, no xanthomas, nares are without discharge. Neck: Negative for carotid bruits. JVP not elevated. Lungs: Clear bilaterally to auscultation without wheezes, rales, or rhonchi. Breathing is unlabored. Cardiac: RRR S1 S2, 2/6 SEM at LSB.  No rubs or gallops.  Abdomen: Soft, non-tender, non-distended with normoactive bowel sounds. No rebound/guarding. Extremities: No clubbing or cyanosis. No edema. Distal pedal pulses are 2+ and equal bilaterally. Skin: Warm and dry, no significant rash. Neuro: Alert and oriented X 3. Sensation in tact. Follows commands. Psych:  Responds to questions appropriately with a normal affect.  Labs    CBC  Recent Labs  10/02/16 0253 10/03/16 0602  WBC 5.5 4.4  HGB 11.1* 11.1*  HCT 34.0* 34.5*  MCV 85.9 85.0  PLT 189 169   Basic Metabolic Panel  Recent Labs  10/02/16 0253 10/03/16 0602  NA 141 139  K 3.5 4.0  CL 116* 115*  CO2 18* 17*  GLUCOSE 90 82  BUN 8 7  CREATININE 0.67 0.81  CALCIUM 8.5* 8.9  MG 1.4* 1.4*   Cardiac Enzymes  Recent Labs  10/01/16 1102 10/01/16 1719 10/01/16 2310  TROPONINI 1.37* 1.15* 0.90*    Telemetry    NSR occ PVCs.  Radiology    Dg Abd 1 View  Result Date: 09/30/2016 CLINICAL DATA:  Abdominal pain. EXAM: ABDOMEN - 1 VIEW COMPARISON:  September 29, 2016 FINDINGS: Nonobstructive bowel gas pattern. Air-filled nondilated loops of bowel seen diffusely throughout the abdomen. No other interval changes or acute abnormalities. IMPRESSION: Numerous air-filled nondilated loops of bowel could represent mild ileus. No evidence of obstruction. Electronically Signed   By: Gerome Sam III M.D   On: 09/30/2016 16:17   Ct Abdomen Pelvis W Contrast  Result Date: 09/28/2016 CLINICAL DATA:  83 year old female with left lower quadrant abdominal pain, nausea and vomiting for the past 2 days EXAM: CT ABDOMEN AND PELVIS WITH CONTRAST TECHNIQUE: Multidetector CT imaging of the abdomen and pelvis was performed using the standard protocol following bolus administration of intravenous contrast. CONTRAST:  80mL ISOVUE-300 IOPAMIDOL (ISOVUE-300) INJECTION 61% COMPARISON:  CT abdomen/ pelvis 06/05/2016 and 04/20/2010 FINDINGS: Lower chest: Greater than 6 year stability of  a 6 mm right lower lobe pulmonary nodule dating back to July of 2011. Stability is consistent with a benign process. A second small nodular opacity in the inferolateral aspect of the right lower lobe is also stable for more than 6 years and benign. Chronic elevation of the left hemidiaphragm with associated atelectasis. Calcifications of the aortic valve. The heart is normal in size. No pericardial effusion. Unremarkable distal thoracic esophagus. Hepatobiliary: Similar degree of biliary ductal dilatation over multiple prior studies dating back to 2011. No significant interval change. The distal common bile duct remains dilated at 1.3 cm prior to abruptly tapering at the level of the ampulla. New no visible choledocholithiasis or mass. Pancreas: Relative pancreatic atrophy in the body an neck with a visible but not particularly dilated pancreatic duct. Similar findings remain unchanged dating back to 2011. Spleen: Normal in size without focal abnormality. Adrenals/Urinary Tract: Stable 5.6 cm peripherally calcified cyst arising exophytic from the upper pole of the right kidney. Alternately, this could represent an exophytic cyst arising from hepatic segment 6 and abutting the kidney. In either event, there has been no interval change over multiple prior studies dating back to 2011. Mild renal parenchymal atrophy worse on the left than the right. There are several additional small circumscribed low-attenuation lesions which are too small to characterize but statistically highly likely benign cysts. No hydronephrosis, nephrolithiasis or enhancing mass. Stomach/Bowel: Very mild distention of multiple loops of fluid-filled small bowel throughout the abdomen without true obstruction or transition point. Similarly, the colon is filled with liquid stool including the rectum. There may be mild hyperenhancement of the mucosal surface of the rectum without associated submucosal edema or thickening. No free air or free fluid.  The left lower quadrant inguinal hernia contains trace fluid and a portion of the anterior wall of the sigmoid colon. This is a change compared to prior. Given the chronic presence of fluid an omental fat within the hernia sac, superimposed inflammatory changes difficult to assess. Vascular/Lymphatic: Mild fusiform aneurysmal dilatation of the infrarenal abdominal aorta with a maximal diameter of 3.2 cm. This is in significantly changed compared to prior. Scattered atherosclerotic vascular calcifications. 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 Reproductive: Surgically absent. Other: Left lower quadrant inguinal hernia containing herniated omental fat, fluid and the anterior wall of the adjacent sigmoid colon. Musculoskeletal: No acute or significant osseous findings. IMPRESSION: 1. Mildly distended and fluid-filled small bowel throughout the abdomen. No definite transition point. Differential considerations include viral enteritis versus partial small bowel obstruction. 2. Liquid stool noted throughout the colon. It is the patient also experiencing diarrhea? 3. Slight interval change in the appearance of the left lower quadrant inguinal hernia which  now contains a portion of the anterior wall of the adjacent sigmoid colon. However, there is no associated inflammation or evidence of obstruction related to this mild change. 4. Fusiform infrarenal abdominal aortic aneurysm measures 3.2 cm, unchanged. Recommend followup by ultrasound in 3 years. This recommendation follows ACR consensus guidelines: White Paper of the ACR Incidental Findings Committee II on Vascular Findings. J Am Coll Radiol 2013; 16:109-604 5. Numerous additional ancillary findings as above without significant interval change over multiple prior studies. Electronically Signed   By: Malachy Moan M.D.   On: 09/28/2016 13:06   Dg Chest Port 1 View  Result Date: 09/30/2016 CLINICAL DATA:  Intermittent upper chest pain  with activity, accompanied by dyspnea. EXAM: PORTABLE CHEST 1 VIEW COMPARISON:  09/28/2016 FINDINGS: Shallow inspiration. Mild linear atelectatic appearing opacities in the bases, accentuated by the shallow degree of inspiration. No confluent consolidation. No large effusion. Normal pulmonary vasculature. IMPRESSION: Mild linear lung base opacities, accentuated by a shallow degree of inspiration. No consolidation or large effusion. Electronically Signed   By: Ellery Plunk M.D.   On: 09/30/2016 00:57   Dg Chest Portable 1 View  Result Date: 09/28/2016 CLINICAL DATA:  Chest wall pain EXAM: PORTABLE CHEST 1 VIEW COMPARISON:  02/16/2016 FINDINGS: Cardiomediastinal silhouette is stable. Again noted thoracic dextroscoliosis. Stable mild ribcage asymmetry. Old right upper rib fractures are stable. Atherosclerotic calcifications of thoracic aorta. Extensive degenerative changes bilateral shoulders. IMPRESSION: No active disease. Electronically Signed   By: Natasha Mead M.D.   On: 09/28/2016 10:33   Dg Abd 2 Views  Result Date: 09/29/2016 CLINICAL DATA:  Diarrhea all night.  Diffuse abdominal discomfort. EXAM: ABDOMEN - 2 VIEW COMPARISON:  CT from yesterday FINDINGS: Nonobstructive bowel gas pattern. Oral contrast administered yesterday has reached the left colon. Contrast also seen in the urinary bladder. Peripherally calcified right renal cyst as described on previous CT. Few colonic and small bowel fluid levels without over distention to suggest obstruction. No evidence of pneumoperitoneum. Advanced lumbar disc degeneration and levoscoliosis. IMPRESSION: 1. Nonobstructive bowel gas pattern. Oral contrast from yesterday has reached the colon. 2. Scattered bowel fluid levels correlating with diarrhea history. Electronically Signed   By: Marnee Spring M.D.   On: 09/29/2016 09:36     Patient Profile     29F with elevated troponin 12/2015, mild-moderate AS/TR, CKD stage III, HTN, multiple abdominal surgeries,  and recurrent SBO admitted with Norovirus, developed CP and NSTEMI while admitted.  Assessment & Plan    1. Gastroenteritis - per IM.  2. NSTEMI with suspected CAD - continues to have intermittent chest pain. Per d/w Dr. Herbie Baltimore, increase Coreg to 6.25mg  BID and add Ranexa. Continue Imdur and titrate as BP allows. She has completed 48 hours of heparin.  3. New cardiomyopathy - EF now 35%, suspect ischemically mediated. BP has been labile so would focus on anti-ischemic therapy first. Can consider ACEI/ARB at a later time if BP allows. She remains on IV fluids - would recommend attention per IM to cap as soon as clinically able.  4. HTN - labile, follow.  5. CKD III - stable.  6. Hypomagnesemia - being managed by IM. Suspect due to GI losses. F/u in AM.  7. Mild-mod AS - follow clinically.  Signed, Laurann Montana, PA-C  10/03/2016, 12:51 PM

## 2016-10-03 NOTE — Progress Notes (Signed)
ANTICOAGULATION CONSULT NOTE - Follow Up Consult  Pharmacy Consult for Heparin Indication: chest pain/ACS  Allergies  Allergen Reactions  . Tramadol Other (See Comments)    Auditory and visual hallucinations  . Codeine Nausea And Vomiting    Violently ill  . Ibuprofen Nausea And Vomiting  . Diltiazem Hcl Other (See Comments)    Not sure about this reaction    Patient Measurements: Height: 4\' 10"  (147.3 cm) Weight: 168 lb 14.4 oz (76.6 kg) IBW/kg (Calculated) : 40.9 Heparin Dosing Weight:   Vital Signs: Temp: 97.9 F (36.6 C) (01/09 2052) Temp Source: Oral (01/09 2052) BP: 127/66 (01/09 2052) Pulse Rate: 72 (01/09 2052)  Labs:  Recent Labs  10/01/16 0553 10/01/16 0902 10/01/16 1102  10/01/16 1719 10/01/16 2310 10/02/16 0253 10/02/16 1414 10/02/16 2322  HGB  --  13.0  --   --   --   --  11.1*  --   --   HCT  --  40.9  --   --   --   --  34.0*  --   --   PLT  --  212  --   --   --   --  189  --   --   APTT  --  30  --   --   --   --   --   --   --   LABPROT  --  14.1  --   --   --   --   --   --   --   INR  --  1.08  --   --   --   --   --   --   --   HEPARINUNFRC  --   --   --   < > 0.16*  --  0.21* 0.48 0.47  CREATININE 0.83  --   --   --   --   --  0.67  --   --   TROPONINI 0.84*  --  1.37*  --  1.15* 0.90*  --   --   --   < > = values in this interval not displayed.  Estimated Creatinine Clearance: 35.8 mL/min (by C-G formula based on SCr of 0.67 mg/dL).   Medications:  Infusions:  . 0.9 % NaCl with KCl 20 mEq / L 75 mL/hr at 10/02/16 1750  . heparin 850 Units/hr (10/02/16 0525)    Assessment: Patient with heparin level at goal again.  No heparin issues noted.  Goal of Therapy:  Heparin level 0.3-0.7 units/ml Monitor platelets by anticoagulation protocol: Yes   Plan:  Continue heparin drip at current rate Will f/u after ordered stop time 1/10 at 0930 Will stop daily heparin level at this time and CBC after 1/10 AM per protocol  Darlina GuysGrimsley Jr,  Jacquenette ShoneJulian Crowford 10/03/2016,3:37 AM

## 2016-10-03 NOTE — Progress Notes (Signed)
CSW confirmed plans for patient to return home with hospice tomorrow. [facility] aware.   No further CSW needs identified - CSW signing off.   Lincoln MaxinKelly Milka Windholz, LCSW [facility] Clinical Social Worker cell #: [phone]

## 2016-10-03 NOTE — Progress Notes (Signed)
PROGRESS NOTE  Erin Gibson  UJW:[id number] DOB: 19-Jun-1919 [name]: 09/28/2016 PCP: Rogelia Boga, MD   Brief Narrative:  83 y.o.WF PMHx CKD stage III, HTN, multiple abdominal surgeries, recurrent SBO  Presents to the ED for evaluation of abdominal pain, nausea, and non-bloody vomiting. The patient says that she has had 2 weeks of abdominal pain, but it has worsened in the past 24 hours with numerous episodes of emesis on the night prior to admission. In addition, the patient had 3 loose stools on the evening of 09/27/2016. She complains of chronic dysuria. She states that she was last on antibiotics around November 2017 for a UTI. She denies any sick contacts. The patient describes her pain as severe and crampy and bandlike across the lower abdomen without any alleviating factors. She complains of some shortness of breath for the last several weeks which has been unchanged, but denies any chest discomfort, hemoptysis, coughing. The patient was recently discharged from the hospital after a stay from 08/05/2016 through 08/10/2016 at which time she was treated for small bowel obstruction. It resolved with medical management. She was also treated for UTI during that period of time.  In the emergency department, the patient was afebrile and hemodynamically stable with oxygen saturation 96-98% on room air. BMP and CBC were unremarkable. LFTs were unremarkable. Urinalysis is negative for pyuria but positive for nitrites. CT of the abdomen and pelvis showed mildly distended fluid-filled loops of small bowel with no transition point with concerns of enteritis versus partial small bowel obstruction. There was also liquid stool noted in the colon. Chest x-ray was negative for any acute findings. She was admitted and was found to have norovirus gastroenteritis. With conservative management, obstruction resolved with contrast to the left colon on AXR 1/6. Diet was advanced. Overnight into 1/8 she experienced  acute exertional substernal chest pain, and cardiac enzymes trended upward. Heparin was started. ECG showed changes in repolarization and echocardiogram showed new reduced EF with apical akinesis and inferoseptal aneurysmal dilatation. Cardiology was consulted and, after long discussion with the patient, recommend a medical plan of care. Heparin is to be continued 48 hrs prior to discharge to SNF.  Subjective: Unchanged chest pressure and dyspnea with minimal exertion. No symptoms at rest. No further diarrhea.  Assessment & Plan:   Active Problems:   Essential hypertension   SBO (small bowel obstruction) (HCC)   UTI (urinary tract infection)   Nausea with vomiting   CKD (chronic kidney disease), stage III   Protein-calorie malnutrition, severe   Small bowel obstruction   Ileus (HCC)   Norovirus   Pulmonary hypertension   NSTEMI (non-ST elevated myocardial infarction) (HCC)   Congestive dilated cardiomyopathy (HCC)  NSTEMI: With large anterior WMA on echocardiogram 1/8. Hemodynamically remains stable but with ongoing intractable dyspnea on exertion. Troponins trending back downward. - Appreciate cardiology input. Suspicious for Takotsubo? - Continue heparin gtt x48 hrs per cardiology rec's. No invasive intervention planned after full discussion with the patient.  - Imdur added, titrating upward - Start ranexa per cardiology - Titrate coreg based on HR/BP - No plan to start statin or ACE/ARB  - Continue O2 prn, po morphine and NTG prn chest pain - Discussed with patient and daughter, expected life expectancy < 6 months, possibly weeks depending on po intake. Will discharge home with hospice once this can be arranged - likely 1/11.  Partial small bowel obstruction/ileus: Resolved. 1/7 KUB shows some loops of small bowel without obstruction.  - General surgery signed off -  Advance diet >> soft  Norovirus gastroenteritis C. diff ( antigen +, toxin negative), not consistent with active  C. difficile infection. Resolved, off antibiotics. Flu PCR neg. - Enteric precautions while inpatient. - Monitor symptoms, continue IVF for now  Recurrent UTI/Dysuria: With chronic bladder prolapse.  - UA appeared infected at admission with +nitrites but no culture sent. - completed 5 days ceftriaxone empirically.  - Urine culture was sent this morning? Will follow up.. - Consider follow up with urology as outpatient if prognosis improves  Cervical strain: Positional, no indication for XR. - Mobilize - Kpad - analgesics as ordered  Pulmonary hypertension  CKD stage III: Baseline creatinine 1.0-1.1 At baseline. - Monitor  Depression/anxiety - Continue clonazepam and SSRI. Discussed risks/benefits of this with patient especially Re: recurrent falls.   Recurrent falls: walks with a walker, lives by herself, has personal care giver coming in day time. - Pt declines home health PT and SNF. Wants to go home.   DVT prophylaxis: Heparin gtt to end 1/10, then prophylactic heparin resumed Code Status: DO NOT RESUSCITATE Family Communication: Daughter by phone Disposition Plan: Discharge to home with home hospice 1/11.   Consultants:  Cardiology  Procedures/Significant Events:  1/5 CT abdomen pelvis W contrast:-Mildly distended and fluid-filled small bowel throughout the Abdomen.  -Liquid stool noted throughout the colon. - Slight interval change in the appearance of the left lower quadrant inguinal hernia which now contains a portion of the anterior wall of the adjacent sigmoid colon. However, there is no associated inflammation or evidence of obstruction related to this mild change. -Fusiform infrarenal abdominal aortic aneurysm measures 3.2 cm, unchanged.  1/7 ZOX:[id number] air-filled nondilated loops of bowel could represent mild ileus  ECHOCARDIOGRAM 10/01/2016: Study Conclusions - Left ventricle: LVEF is approximately 35% with akinesis of the   apical of LV with aneurysmal  dilitation of the distal   inferior/inferoseptal walls. SInce echo of APril 2017, these   changes are new. The cavity size was normal. Wall thickness was   normal. Doppler parameters are consistent with abnormal left   ventricular relaxation (grade 1 diastolic dysfunction). - Aortic valve: AV is thickened, calcified with restricted motion   Peak and mean gradients through the valve are 32 and 17 mm Hg   respectively This is consistent with mild AS. 2D imaging suggests   that AS is more moderate is severity Gradient may be dow because   of depressed LVEF There was trivial regurgitation. - Tricuspid valve: There was mild-moderate regurgitation. - Pulmonary arteries: PA peak pressure: 44 mm Hg (S).  Cultures 1/5 C. difficile positive antigen: Negative toxin 1/5 GI panel positive Norovirus 1/8 urine culture pending  Continuous Infusions: . 0.9 % NaCl with KCl 20 mEq / L 75 mL/hr at 10/03/16 0616    Objective: Vitals:   10/02/16 1317 10/02/16 2052 10/03/16 0457 10/03/16 0941  BP: (!) 115/57 127/66 (!) 161/90 (!) 160/73  Pulse: 79 72 74 72  Resp: 20 18 20    Temp: 97.8 F (36.6 C) 97.9 F (36.6 C) 98.2 F (36.8 C)   TempSrc: Oral Oral Oral   SpO2: 97% 96% 98%   Weight:      Height:        Intake/Output Summary (Last 24 hours) at 10/03/16 1252 Last data filed at 10/03/16 0900  Gross per 24 hour  Intake           3194.6 ml  Output  800 ml  Net           2394.6 ml   Filed Weights   09/28/16 1014 10/01/16 0118 10/01/16 0454  Weight: 48.5 kg (107 lb) 47.1 kg (103 lb 13.4 oz) 76.6 kg (168 lb 14.4 oz)    Examination:  General: Pleasant, active, elderly female in no distress Eyes: negative scleral hemorrhage, negative anisocoria, negative icterus ENT: Negative Runny nose, negative gingival bleeding, Neck:  Negative scars, masses, torticollis, lymphadenopathy, JVD Lungs: Clear to auscultation bilaterally without wheezes or crackles Cardiovascular: Tachycardic,  Regular rhythm without murmur gallop or rub normal S1 and S2. No JVD or pedal edema. Abdomen: Soft, not tender to palpation. Hypoactive bowel sounds. Nondistended.  Extremities: No significant cyanosis, clubbing, or edema bilateral lower extremities Skin: Negative rashes, lesions, ulcers Psychiatric:  Negative depression, negative anxiety, negative fatigue, negative mania  Central nervous system:  Cranial nerves II through XII intact, tongue/uvula midline, all extremities muscle strength 5/5, sensation intact throughout,  negative dysarthria, negative expressive aphasia, negative receptive aphasia.  Data Reviewed: Care during the described time interval was provided by me .  I have reviewed this patient's available data, including medical history, events of note, physical examination, and all test results as part of my evaluation.  I have personally reviewed and interpreted all radiology studies.  CBC:  Recent Labs Lab 09/28/16 1034  09/29/16 0312 09/30/16 0150 10/01/16 0902 10/02/16 0253 10/03/16 0602  WBC 6.6  --  3.4* 3.0* 5.0 5.5 4.4  NEUTROABS 6.3  --   --   --   --   --   --   HGB 12.2  < > 10.4* 11.4* 13.0 11.1* 11.1*  HCT 37.9  < > 32.4* 35.6* 40.9 34.0* 34.5*  MCV 87.5  --  89.3 89.4 87.2 85.9 85.0  PLT 233  --  179 196 212 189 169  < > = values in this interval not displayed. Basic Metabolic Panel:  Recent Labs Lab 09/29/16 0312 09/30/16 0150 10/01/16 0553 10/02/16 0253 10/03/16 0602  NA 141 142 142 141 139  K 3.5 4.0 3.8 3.5 4.0  CL 114* 117* 112* 116* 115*  CO2 21* 19* 17* 18* 17*  GLUCOSE 79 83 64* 90 82  BUN 27* 21* 10 8 7   CREATININE 1.03* 1.00 0.83 0.67 0.81  CALCIUM 8.3* 8.8* 9.1 8.5* 8.9  MG  --  1.7 1.5* 1.4* 1.4*   GFR: Estimated Creatinine Clearance: 35.4 mL/min (by C-G formula based on SCr of 0.81 mg/dL). Liver Function Tests:  Recent Labs Lab 09/28/16 1034  AST 19  ALT 13*  ALKPHOS 115  BILITOT 0.7  PROT 6.3*  ALBUMIN 3.5    Recent  Labs Lab 09/28/16 1034 09/28/16 1654  LIPASE 28 17   No results for input(s): AMMONIA in the last 168 hours. Coagulation Profile:  Recent Labs Lab 10/01/16 0902  INR 1.08   Cardiac Enzymes:  Recent Labs Lab 10/01/16 0048 10/01/16 0553 10/01/16 1102 10/01/16 1719 10/01/16 2310  TROPONINI 0.42* 0.84* 1.37* 1.15* 0.90*   BNP (last 3 results)  Recent Labs  02/16/16 1346  PROBNP 312.0*   HbA1C: No results for input(s): HGBA1C in the last 72 hours. CBG:  Recent Labs Lab 10/01/16 0841  GLUCAP 77   Lipid Profile: No results for input(s): CHOL, HDL, LDLCALC, TRIG, CHOLHDL, LDLDIRECT in the last 72 hours. Thyroid Function Tests: No results for input(s): TSH, T4TOTAL, FREET4, T3FREE, THYROIDAB in the last 72 hours. Anemia Panel: No results for input(s): VITAMINB12,  FOLATE, FERRITIN, TIBC, IRON, RETICCTPCT in the last 72 hours. Sepsis Labs:  Recent Labs Lab 09/28/16 1044  LATICACIDVEN 0.81    Recent Results (from the past 240 hour(s))  Gastrointestinal Panel by PCR , Stool     Status: Abnormal   Collection Time: 09/28/16  3:20 PM  Result Value Ref Range Status   Campylobacter species NOT DETECTED NOT DETECTED Final   Plesimonas shigelloides NOT DETECTED NOT DETECTED Final   Salmonella species NOT DETECTED NOT DETECTED Final   Yersinia enterocolitica NOT DETECTED NOT DETECTED Final   Vibrio species NOT DETECTED NOT DETECTED Final   Vibrio cholerae NOT DETECTED NOT DETECTED Final   Enteroaggregative E coli (EAEC) NOT DETECTED NOT DETECTED Final   Enteropathogenic E coli (EPEC) NOT DETECTED NOT DETECTED Final   Enterotoxigenic E coli (ETEC) NOT DETECTED NOT DETECTED Final   Shiga like toxin producing E coli (STEC) NOT DETECTED NOT DETECTED Final   Shigella/Enteroinvasive E coli (EIEC) NOT DETECTED NOT DETECTED Final   Cryptosporidium NOT DETECTED NOT DETECTED Final   Cyclospora cayetanensis NOT DETECTED NOT DETECTED Final   Entamoeba histolytica NOT DETECTED  NOT DETECTED Final   Giardia lamblia NOT DETECTED NOT DETECTED Final   Adenovirus F40/41 NOT DETECTED NOT DETECTED Final   Astrovirus NOT DETECTED NOT DETECTED Final   Norovirus GI/GII DETECTED (A) NOT DETECTED Final    Comment: CRITICAL RESULT CALLED TO, READ BACK BY AND VERIFIED WITH: KATIE SILK AT 1548 ON 09/29/16 BY SNJ    Rotavirus A NOT DETECTED NOT DETECTED Final   Sapovirus (I, II, IV, and V) NOT DETECTED NOT DETECTED Final  C difficile quick scan w PCR reflex     Status: Abnormal   Collection Time: 09/28/16  3:20 PM  Result Value Ref Range Status   C Diff antigen POSITIVE (A) NEGATIVE Final   C Diff toxin NEGATIVE NEGATIVE Final   C Diff interpretation Results are indeterminate. See PCR results.  Final  Clostridium Difficile by PCR     Status: Abnormal   Collection Time: 09/28/16  3:20 PM  Result Value Ref Range Status   Toxigenic C Difficile by pcr POSITIVE (A) NEGATIVE Final    Comment: Positive for toxigenic C. difficile with little to no toxin production. Only treat if clinical presentation suggests symptomatic illness.  Culture, Urine     Status: None   Collection Time: 10/01/16  8:14 AM  Result Value Ref Range Status   Specimen Description URINE, CLEAN CATCH  Final   Special Requests NONE  Final   Culture NO GROWTH Performed at [facility]   Final   Report Status 10/02/2016 FINAL  Final    Radiology Studies: No results found. Scheduled Meds: . carvedilol  6.25 mg Oral BID WC  . cefTRIAXone (ROCEPHIN)  IV  1 g Intravenous Q24H  . isosorbide mononitrate  60 mg Oral Daily  .  morphine injection  1 mg Intravenous Once  . ranolazine  500 mg Oral BID   Continuous Infusions: . 0.9 % NaCl with KCl 20 mEq / L 75 mL/hr at 10/03/16 0616    LOS: 5 days   Time spent: 25 min  Hazeline Junker, MD [facility] Pager [phone]  If 7PM-7AM, please contact night-coverage [email or web address] Password [email or web address] 10/03/2016, 12:52 PM

## 2016-10-03 NOTE — Care Management Note (Signed)
Case Management Note  Patient Details  Name: Erin Gibson MRN: [id number] Date of Birth: 09/07/1919  Subjective/Objective:Referral to offer home hospice choice-Spoke to dtr [facility] tel#[phone]-provided w/home hospice provider list-chose [facility]-TC Bambi rep-she will eval & confirm if able to accept.Dtr prefers home hospice doctor to follow, patient has w/c & 3n1. Dtr already has custodial level care-[facility] caregiver will be providing 24/7 care.Dtr agrees to patient's current  bed_MD also agreed patient's current bed @ home is fine. Ambulance transp needed @ d/c-confirmed address. Await [facility] eval.D/c plan in am.                  Action/Plan:d/c home w/hospice.   Expected Discharge Date:   (UNKNOWN)               Expected Discharge Plan:  Home w Hospice Care  In-House Referral:     Discharge planning Services  CM Consult  Post Acute Care Choice:    Choice offered to:  Adult Children  DME Arranged:    DME Agency:     HH Arranged:  RN HH Agency:  Other - See comment [facility])  Status of Service:  In process, will continue to follow  If discussed at Long Length of Stay Meetings, dates discussed:    Additional Comments:  Lanier ClamMahabir, Mariaeduarda Defranco, RN 10/03/2016, 1:03 PM

## 2016-10-04 ENCOUNTER — Inpatient Hospital Stay (HOSPITAL_COMMUNITY): Payer: Medicare Other

## 2016-10-04 DIAGNOSIS — I5021 Acute systolic (congestive) heart failure: Secondary | ICD-10-CM

## 2016-10-04 LAB — MAGNESIUM: Magnesium: 1.8 mg/dL (ref 1.7–2.4)

## 2016-10-04 LAB — BASIC METABOLIC PANEL
Anion gap: 6 (ref 5–15)
BUN: 7 mg/dL (ref 6–20)
CHLORIDE: 111 mmol/L (ref 101–111)
CO2: 17 mmol/L — ABNORMAL LOW (ref 22–32)
CREATININE: 0.86 mg/dL (ref 0.44–1.00)
Calcium: 8.9 mg/dL (ref 8.9–10.3)
GFR calc Af Amer: >60 mL/min (ref >60)
GFR calc non Af Amer: 55 mL/min — ABNORMAL LOW (ref >60)
GLUCOSE: 80 mg/dL (ref 65–99)
Potassium: 4.5 mmol/L (ref 3.5–5.1)
SODIUM: 134 mmol/L — AB (ref 135–145)

## 2016-10-04 LAB — CBC
HCT: 31.9 % — ABNORMAL LOW (ref 36.0–46.0)
Hemoglobin: 10.6 g/dL — ABNORMAL LOW (ref 12.0–15.0)
MCH: 28 pg (ref 26.0–34.0)
MCHC: 33.2 g/dL (ref 30.0–36.0)
MCV: 84.2 fL (ref 78.0–100.0)
PLATELETS: 161 10*3/uL (ref 150–400)
RBC: 3.79 MIL/uL — ABNORMAL LOW (ref 3.87–5.11)
RDW: 14.9 % (ref 11.5–15.5)
WBC: 5.2 10*3/uL (ref 4.0–10.5)

## 2016-10-04 MED ORDER — FUROSEMIDE 10 MG/ML IJ SOLN
60.0000 mg | INTRAMUSCULAR | Status: AC
  Administered 2016-10-04: 60 mg via INTRAVENOUS
  Filled 2016-10-04: qty 6

## 2016-10-04 MED ORDER — MORPHINE SULFATE 10 MG/5ML PO SOLN: 2.5000 mg | ORAL | 0 refills | Status: DC | PRN

## 2016-10-04 MED ORDER — ISOSORBIDE MONONITRATE ER 30 MG PO TB24: 60.0000 mg | ORAL_TABLET | Freq: Every day | ORAL | 0 refills | Status: DC

## 2016-10-04 MED ORDER — FUROSEMIDE 20 MG PO TABS
20.0000 mg | ORAL_TABLET | Freq: Every day | ORAL | Status: DC
  Administered 2016-10-05: 20 mg via ORAL
  Filled 2016-10-04: qty 1

## 2016-10-04 MED ORDER — RANOLAZINE ER 500 MG PO TB12: 500.0000 mg | ORAL_TABLET | Freq: Two times a day (BID) | ORAL | 0 refills | Status: DC

## 2016-10-04 MED ORDER — CARVEDILOL 6.25 MG PO TABS: 6.2500 mg | ORAL_TABLET | Freq: Two times a day (BID) | ORAL | 0 refills | Status: DC

## 2016-10-04 MED ORDER — MORPHINE SULFATE (PF) 2 MG/ML IV SOLN
2.0000 mg | Freq: Once | INTRAVENOUS | Status: AC
  Administered 2016-10-04: 2 mg via INTRAVENOUS
  Filled 2016-10-04: qty 1

## 2016-10-04 NOTE — Discharge Summary (Signed)
Physician Discharge Summary  Erin Gibson KGM:[id number] DOB: [birth year] [name]: 09/28/2016  PCP: Rogelia Boga, MD  Admit date: 09/28/2016 Discharge date: 10/04/2016  Admitted From: Home Disposition: Home   Recommendations for Outpatient Follow-up:  1. Follow up with PCP in 1-2 weeks 2. Please obtain BMP/CBC in one week 3. Ongoing monitoring of exertional symptoms and management per hospice.   Home Health: Hospice Equipment/Devices: None Discharge Condition: Stable for transfer, guarded prognosis CODE STATUS: DNR Diet recommendation: Ad lib  Brief/Interim Summary: 83 y.o.WF PMHx CKD stage III, HTN, multiple abdominal surgeries, recurrent SBO  Presents to the ED for evaluation of abdominal pain, nausea, and non-bloody vomiting. The patient says that she has had 2 weeks of abdominal pain, but it has worsened in the past 24 hours with numerous episodes of emesis on the night prior to admission. In addition, the patient had 3 loose stools on the evening of 09/27/2016. She complains of chronic dysuria. She states that she was last on antibiotics around November 2017 for a UTI. She denies any sick contacts. The patient describes her pain as severe and crampy and bandlike across the lower abdomen without any alleviating factors. She complains of some shortness of breath for the last several weeks which has been unchanged, but denies any chest discomfort, hemoptysis, coughing. The patient was recently discharged from the hospital after a stay from 08/05/2016 through 08/10/2016 at which time she was treated for small bowel obstruction. It resolved with medical management. She was also treated for UTI during that period of time.  In the emergency department, the patient was afebrile and hemodynamically stable with oxygen saturation 96-98% on room air. BMP and CBC were unremarkable. LFTs were unremarkable. Urinalysis is negative for pyuria but positive for nitrites. CT of the abdomen and pelvis  showed mildly distended fluid-filled loops of small bowel with no transition point with concerns of enteritis versus partial small bowel obstruction. There was also liquid stool noted in the colon. Chest x-ray was negative for any acute findings. She was admitted and was found to have norovirus gastroenteritis. With conservative management, obstruction resolved with contrast to the left colon on AXR 1/6. Diet was advanced. Overnight into 1/8 she experienced acute exertional substernal chest pain, and cardiac enzymes trended upward. Heparin was started. ECG showed changes in repolarization and echocardiogram showed new reduced EF with apical akinesis and inferoseptal aneurysmal dilatation. Cardiology was consulted and, after long discussion with the patient, recommend a medical plan of care. Heparin was continued for 48 hours and the patient continues to experience exertional symptoms. After discussion with the patient and her family, the disposition, previously to SNF, was changed to home with hospice.   Discharge Diagnoses:  Principal Problem:   Norovirus Active Problems:   Essential hypertension   SBO (small bowel obstruction) (HCC)   UTI (urinary tract infection)   Nausea with vomiting   CKD (chronic kidney disease), stage III   Protein-calorie malnutrition, severe   Small bowel obstruction   Ileus (HCC)   Pulmonary hypertension   NSTEMI (non-ST elevated myocardial infarction) (HCC)   Congestive dilated cardiomyopathy (HCC)   Acute systolic CHF (congestive heart failure) (HCC)  NSTEMI: With large anterior WMA on echocardiogram 1/8. Hemodynamically remains stable but with ongoing intractable dyspnea on exertion. Troponins trending back downward. - Appreciate cardiology input. Suspicious for Takotsubo? - Continue heparin gtt x48 hrs per cardiology rec's. No invasive intervention planned after full discussion with the patient.  - Imdur added, titrating upward - Started ranexa per cardiology -  Titrated coreg based on HR/BP - No plan to start statin or ACE/ARB  - Continue O2 prn, po morphine and NTG prn chest pain - Discussed with patient and daughter, expected life expectancy < 6 months, possibly weeks depending on po intake. Will discharge home with hospice 1/11.  Partial small bowel obstruction/ileus: Resolved. 1/7 KUB shows some loops of small bowel without obstruction.  - General surgery signed off - Advance diet >> soft - Repeat XR on day of discharge performed due to abdominal pain overnight. No evidence of mechanical obstruction and pt passing flatus and eating without symptoms. Continue to monitor and treat with bowel regimen  Norovirus gastroenteritis C. diff ( antigen +, toxin negative), not consistent with active C. difficile infection. Resolved, off antibiotics. Flu PCR neg. - Enteric precautions while inpatient. - Monitor symptoms, continue IVF for now  Recurrent UTI/Dysuria: With chronic bladder prolapse.  - UA appeared infected at admission with +nitrites but no culture sent. - completed 5 days ceftriaxone empirically.  - Urine culture was sent this morning? Will follow up.. - Consider follow up with urology as outpatient if prognosis improves  Cervical strain: Positional, no indication for XR. - Mobilize - Kpad - analgesics as ordered  Pulmonary hypertension  CKD stage III: Baseline creatinine 1.0-1.1 At baseline. - Monitor  Depression/anxiety - Continue clonazepam and SSRI. Discussed risks/benefits of this with patient especially Re: recurrent falls.   Recurrent falls:walks with a walker, lives by herself, has personal care giver coming in day time. - Pt declines home health PT and SNF. Wants to go home. Family has arranged caretaking assistance and will have hospice care.  Discharge Instructions Discharge Instructions    Discharge instructions    Complete by:  As directed    You were admitted with diarrhea due to norovirus which has  resolved. You also had a heart attack which was treated while you were hospitalized. You are stable to be discharged to home with hospice services today. Note medication changes on discharge paperwork. A printed prescription for morphine by mouth which should be taken as needed for pain, for shortness of breath, or chest pain.     Allergies as of 10/04/2016      Reactions   Tramadol Other (See Comments)   Auditory and visual hallucinations   Codeine Nausea And Vomiting   Violently ill   Ibuprofen Nausea And Vomiting   Diltiazem Hcl Other (See Comments)   Not sure about this reaction      Medication List    STOP taking these medications   amLODipine 2.5 MG tablet Commonly known as:  NORVASC   cefdinir 300 MG capsule Commonly known as:  OMNICEF   lisinopril 20 MG tablet Commonly known as:  PRINIVIL,ZESTRIL     TAKE these medications   acetaminophen 325 MG tablet Commonly known as:  TYLENOL Take 2 tablets (650 mg total) by mouth every 4 (four) hours as needed for mild pain or moderate pain. What changed:  when to take this   aspirin EC 81 MG tablet Take 81 mg by mouth every morning.   carvedilol 6.25 MG tablet Commonly known as:  COREG Take 1 tablet (6.25 mg total) by mouth 2 (two) times daily with a meal.   clonazePAM 0.5 MG tablet Commonly known as:  KLONOPIN Take 0.5 tablets (0.25 mg total) by mouth 3 (three) times daily as needed for anxiety.   furosemide 40 MG tablet Commonly known as:  LASIX TAKE 1/2 TABLET (=20MG )    DAILY  What changed:  how much to take  how to take this  when to take this  additional instructions   isosorbide mononitrate 30 MG 24 hr tablet Commonly known as:  IMDUR Take 2 tablets (60 mg total) by mouth daily. What changed:  how much to take   Melatonin 3 MG Tabs Take 3 mg by mouth at bedtime.   morphine 10 MG/5ML solution Take 1.3 mLs (2.6 mg total) by mouth every 2 (two) hours as needed (dyspnea, chest pain).   nitroGLYCERIN  0.4 MG SL tablet Commonly known as:  NITROSTAT Place 1 tablet (0.4 mg total) under the tongue every 5 (five) minutes as needed for chest pain.   polyethylene glycol powder powder Commonly known as:  GLYCOLAX/MIRALAX MIX 17GM IN LIQUID AND     DRINK TWO TIMES A DAY   PRESERVISION AREDS 2 Caps Take 1 capsule by mouth daily.   ranolazine 500 MG 12 hr tablet Commonly known as:  RANEXA Take 1 tablet (500 mg total) by mouth 2 (two) times daily.   sertraline 25 MG tablet Commonly known as:  ZOLOFT Take 1 tablet (25 mg total) by mouth daily.   SYSTANE 0.4-0.3 % Soln Generic drug:  Polyethyl Glycol-Propyl Glycol Place 2 drops into both eyes 2 (two) times daily.   Vitamin B-12 2500 MCG Subl Place 2,500 mcg under the tongue every morning.       Contact information for follow-up providers    [facility] Follow up.   Specialty:  Hospice Services [facility]:  [facility] Contact information: [address] [place] [address] [phone]        [name],[name], MD .   Specialty:  Internal Medicine Contact information: [address] [place] [address] [phone]            Contact information for after-discharge care    Destination    [facility] .   Contact information: [address] [place] [id number] [phone]                 Allergies  Allergen Reactions  . Tramadol Other (See Comments)    Auditory and visual hallucinations  . Codeine Nausea And Vomiting    Violently ill  . Ibuprofen Nausea And Vomiting  . Diltiazem Hcl Other (See Comments)    Not sure about this reaction    Consultations:  Cardiology, Dr. Herbie BaltimoreHarding  Palliative care by phone only  Procedures/Studies: Dg Abd 1 View  Result Date: 09/30/2016 CLINICAL DATA:  Abdominal pain. EXAM: ABDOMEN - 1 VIEW COMPARISON:  September 29, 2016 FINDINGS: Nonobstructive bowel gas pattern. Air-filled nondilated loops of bowel  seen diffusely throughout the abdomen. No other interval changes or acute abnormalities. IMPRESSION: Numerous air-filled nondilated loops of bowel could represent mild ileus. No evidence of obstruction. Electronically Signed   By: Gerome Samavid  Williams III M.D   On: 09/30/2016 16:17   Ct Abdomen Pelvis W Contrast  Result Date: 09/28/2016 CLINICAL DATA:  83 year old female with left lower quadrant abdominal pain, nausea and vomiting for the past 2 days EXAM: CT ABDOMEN AND PELVIS WITH CONTRAST TECHNIQUE: Multidetector CT imaging of the abdomen and pelvis was performed using the standard protocol following bolus administration of intravenous contrast. CONTRAST:  80mL ISOVUE-300 IOPAMIDOL (ISOVUE-300) INJECTION 61% COMPARISON:  CT abdomen/ pelvis 06/05/2016 and 04/20/2010 FINDINGS: Lower chest: Greater than 6 year stability of a 6 mm right lower lobe pulmonary nodule dating back to July of 2011.  Stability is consistent with a benign process. A second small nodular opacity in the inferolateral aspect of the right lower lobe is also stable for more than 6 years and benign. Chronic elevation of the left hemidiaphragm with associated atelectasis. Calcifications of the aortic valve. The heart is normal in size. No pericardial effusion. Unremarkable distal thoracic esophagus. Hepatobiliary: Similar degree of biliary ductal dilatation over multiple prior studies dating back to 2011. No significant interval change. The distal common bile duct remains dilated at 1.3 cm prior to abruptly tapering at the level of the ampulla. New no visible choledocholithiasis or mass. Pancreas: Relative pancreatic atrophy in the body an neck with a visible but not particularly dilated pancreatic duct. Similar findings remain unchanged dating back to 2011. Spleen: Normal in size without focal abnormality. Adrenals/Urinary Tract: Stable 5.6 cm peripherally calcified cyst arising exophytic from the upper pole of the right kidney. Alternately, this  could represent an exophytic cyst arising from hepatic segment 6 and abutting the kidney. In either event, there has been no interval change over multiple prior studies dating back to 2011. Mild renal parenchymal atrophy worse on the left than the right. There are several additional small circumscribed low-attenuation lesions which are too small to characterize but statistically highly likely benign cysts. No hydronephrosis, nephrolithiasis or enhancing mass. Stomach/Bowel: Very mild distention of multiple loops of fluid-filled small bowel throughout the abdomen without true obstruction or transition point. Similarly, the colon is filled with liquid stool including the rectum. There may be mild hyperenhancement of the mucosal surface of the rectum without associated submucosal edema or thickening. No free air or free fluid. The left lower quadrant inguinal hernia contains trace fluid and a portion of the anterior wall of the sigmoid colon. This is a change compared to prior. Given the chronic presence of fluid an omental fat within the hernia sac, superimposed inflammatory changes difficult to assess. Vascular/Lymphatic: Mild fusiform aneurysmal dilatation of the infrarenal abdominal aorta with a maximal diameter of 3.2 cm. This is in significantly changed compared to prior. Scattered atherosclerotic vascular calcifications. 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 Reproductive: Surgically absent. Other: Left lower quadrant inguinal hernia containing herniated omental fat, fluid and the anterior wall of the adjacent sigmoid colon. Musculoskeletal: No acute or significant osseous findings. IMPRESSION: 1. Mildly distended and fluid-filled small bowel throughout the abdomen. No definite transition point. Differential considerations include viral enteritis versus partial small bowel obstruction. 2. Liquid stool noted throughout the colon. It is the patient also experiencing diarrhea? 3. Slight  interval change in the appearance of the left lower quadrant inguinal hernia which now contains a portion of the anterior wall of the adjacent sigmoid colon. However, there is no associated inflammation or evidence of obstruction related to this mild change. 4. Fusiform infrarenal abdominal aortic aneurysm measures 3.2 cm, unchanged. Recommend followup by ultrasound in 3 years. This recommendation follows ACR consensus guidelines: White Paper of the ACR Incidental Findings Committee II on Vascular Findings. J Am Coll Radiol 2013; 16:109-604 5. Numerous additional ancillary findings as above without significant interval change over multiple prior studies. Electronically Signed   By: Malachy Moan M.D.   On: 09/28/2016 13:06   Dg Chest Port 1 View  Result Date: 09/30/2016 CLINICAL DATA:  Intermittent upper chest pain with activity, accompanied by dyspnea. EXAM: PORTABLE CHEST 1 VIEW COMPARISON:  09/28/2016 FINDINGS: Shallow inspiration. Mild linear atelectatic appearing opacities in the bases, accentuated by the shallow degree of inspiration. No confluent consolidation.  No large effusion. Normal pulmonary vasculature. IMPRESSION: Mild linear lung base opacities, accentuated by a shallow degree of inspiration. No consolidation or large effusion. Electronically Signed   By: Ellery Plunk M.D.   On: 09/30/2016 00:57   Dg Chest Portable 1 View  Result Date: 09/28/2016 CLINICAL DATA:  Chest wall pain EXAM: PORTABLE CHEST 1 VIEW COMPARISON:  02/16/2016 FINDINGS: Cardiomediastinal silhouette is stable. Again noted thoracic dextroscoliosis. Stable mild ribcage asymmetry. Old right upper rib fractures are stable. Atherosclerotic calcifications of thoracic aorta. Extensive degenerative changes bilateral shoulders. IMPRESSION: No active disease. Electronically Signed   By: Natasha Mead M.D.   On: 09/28/2016 10:33   Dg Abd 2 Views  Result Date: 10/04/2016 CLINICAL DATA:  83 year old female with recent abdominal  pain and distension. Initial encounter. EXAM: ABDOMEN - 2 VIEW COMPARISON:  09/30/2016 abdominal films an earlier, CT Abdomen and Pelvis 09/28/2016 FINDINGS: Supine and left-side-down lateral decubitus views of the abdomen. No pneumoperitoneum. Gas-filled small and large bowel loops throughout the abdomen have not significantly changed over this series of exams. No differential small to large bowel dilatation. The oral contrast administered on 09/28/2016 appears to be completely passed through the bowel. Moderate to severe scoliosis. Calcified aortic atherosclerosis. Osteopenia. No acute osseous abnormality identified. IMPRESSION: 1. Stable bowel gas pattern over this series of exams since the CT Abdomen and Pelvis on 09/28/2016. Constellation does not suggest mechanical bowel obstruction but might indicate an ongoing ileus. Note that oral contrast administered on 09/28/2016 seems to have completely cleared the GI tract. 2. No free air. 3.  Calcified aortic atherosclerosis. Electronically Signed   By: Odessa Fleming M.D.   On: 10/04/2016 11:51   Dg Abd 2 Views  Result Date: 09/29/2016 CLINICAL DATA:  Diarrhea all night.  Diffuse abdominal discomfort. EXAM: ABDOMEN - 2 VIEW COMPARISON:  CT from yesterday FINDINGS: Nonobstructive bowel gas pattern. Oral contrast administered yesterday has reached the left colon. Contrast also seen in the urinary bladder. Peripherally calcified right renal cyst as described on previous CT. Few colonic and small bowel fluid levels without over distention to suggest obstruction. No evidence of pneumoperitoneum. Advanced lumbar disc degeneration and levoscoliosis. IMPRESSION: 1. Nonobstructive bowel gas pattern. Oral contrast from yesterday has reached the colon. 2. Scattered bowel fluid levels correlating with diarrhea history. Electronically Signed   By: Marnee Spring M.D.   On: 09/29/2016 09:36   1/5 CT abdomen pelvis W contrast:-Mildly distended and fluid-filled small bowel throughout  the Abdomen.  -Liquid stool noted throughout the colon. - Slight interval change in the appearance of the left lower quadrant inguinal hernia which now contains a portion of the anterior wall of the adjacent sigmoid colon. However, there is no associated inflammation or evidence of obstruction related to this mild change. -Fusiform infrarenal abdominal aortic aneurysm measures 3.2 cm, unchanged.  1/7 [id number] air-filled nondilated loops of bowel could represent mild ileus  ECHOCARDIOGRAM 10/01/2016: Study Conclusions - Left ventricle: LVEF is approximately 35% with akinesis of the apical of LV with aneurysmal dilitation of the distal inferior/inferoseptal walls. SInce echo of APril 2017, these changes are new. The cavity size was normal. Wall thickness was normal. Doppler parameters are consistent with abnormal left ventricular relaxation (grade 1 diastolic dysfunction). - Aortic valve: AV is thickened, calcified with restricted motion Peak and mean gradients through the valve are 32 and 17 mm Hg respectively This is consistent with mild AS. 2D imaging suggests that AS is more moderate is severity Gradient may be dow because of depressed  LVEF There was trivial regurgitation. - Tricuspid valve: There was mild-moderate regurgitation. - Pulmonary arteries: PA peak pressure: 44 mm Hg (S).  Cultures 1/5 C. difficile positive antigen: Negative toxin 1/5 GI panel positive Norovirus 1/8 urine culture pending  Subjective: Pt feeling stable this morning. Ate a full breakfast without abd pain, N/V. Diarrhea has resolved. Had sudden abdominal pain last night which resolved with morphine. No other complaints.  Discharge Exam: Vitals:   10/04/16 0928 10/04/16 1310  BP: (!) 160/79 115/61  Pulse: 79 (!) 59  Resp:  20  Temp:  97.4 F (36.3 C)   Vitals:   10/04/16 0425 10/04/16 0700 10/04/16 0928 10/04/16 1310  BP: (!) 177/92 (!) 152/98 (!) 160/79 115/61  Pulse: 67  79  (!) 59  Resp: 20   20  Temp: 98.2 F (36.8 C)   97.4 F (36.3 C)  TempSrc: Oral   Oral  SpO2: 98%   94%  Weight:    51.4 kg (113 lb 5.1 oz)  Height:       General: Pleasant, thin elderly female in no distress Cardiovascular: RRR, S1/S2 +, no rubs, no gallops Respiratory: CTA bilaterally, no wheezing, no rhonchi Abdominal: Soft, NT, ND, bowel sounds + Extremities: no edema, no cyanosis  Labs: BNP (last 3 results)  Recent Labs  02/16/16 1625  BNP 280.7*   Basic Metabolic Panel:  Recent Labs Lab 09/30/16 0150 10/01/16 0553 10/02/16 0253 10/03/16 0602 10/04/16 0518  NA 142 142 141 139 134*  K 4.0 3.8 3.5 4.0 4.5  CL 117* 112* 116* 115* 111  CO2 19* 17* 18* 17* 17*  GLUCOSE 83 64* 90 82 80  BUN 21* 10 8 7 7   CREATININE 1.00 0.83 0.67 0.81 0.86  CALCIUM 8.8* 9.1 8.5* 8.9 8.9  MG 1.7 1.5* 1.4* 1.4* 1.8   Liver Function Tests:  Recent Labs Lab 09/28/16 1034  AST 19  ALT 13*  ALKPHOS 115  BILITOT 0.7  PROT 6.3*  ALBUMIN 3.5    Recent Labs Lab 09/28/16 1034 09/28/16 1654  LIPASE 28 17   CBC:  Recent Labs Lab 09/28/16 1034  09/30/16 0150 10/01/16 0902 10/02/16 0253 10/03/16 0602 10/04/16 0518  WBC 6.6  < > 3.0* 5.0 5.5 4.4 5.2  NEUTROABS 6.3  --   --   --   --   --   --   HGB 12.2  < > 11.4* 13.0 11.1* 11.1* 10.6*  HCT 37.9  < > 35.6* 40.9 34.0* 34.5* 31.9*  MCV 87.5  < > 89.4 87.2 85.9 85.0 84.2  PLT 233  < > 196 212 189 169 161  < > = values in this interval not displayed. Cardiac Enzymes:  Recent Labs Lab 10/01/16 0048 10/01/16 0553 10/01/16 1102 10/01/16 1719 10/01/16 2310  TROPONINI 0.42* 0.84* 1.37* 1.15* 0.90*   CBG:  Recent Labs Lab 10/01/16 0841  GLUCAP 77   Urinalysis    Component Value Date/Time   COLORURINE YELLOW 09/28/2016 1131   APPEARANCEUR HAZY (A) 09/28/2016 1131   LABSPEC 1.017 09/28/2016 1131   PHURINE 5.0 09/28/2016 1131   GLUCOSEU NEGATIVE 09/28/2016 1131   HGBUR SMALL (A) 09/28/2016 1131    BILIRUBINUR NEGATIVE 09/28/2016 1131   BILIRUBINUR negative 08/09/2016 1618   KETONESUR 5 (A) 09/28/2016 1131   PROTEINUR 30 (A) 09/28/2016 1131   UROBILINOGEN 0.2 08/09/2016 1618   UROBILINOGEN 0.2 07/21/2015 2225   NITRITE POSITIVE (A) 09/28/2016 1131   LEUKOCYTESUR TRACE (A) 09/28/2016 1131   Microbiology  Recent Results (from the past 240 hour(s))  Gastrointestinal Panel by PCR , Stool     Status: Abnormal   Collection Time: 09/28/16  3:20 PM  Result Value Ref Range Status   Campylobacter species NOT DETECTED NOT DETECTED Final   Plesimonas shigelloides NOT DETECTED NOT DETECTED Final   Salmonella species NOT DETECTED NOT DETECTED Final   Yersinia enterocolitica NOT DETECTED NOT DETECTED Final   Vibrio species NOT DETECTED NOT DETECTED Final   Vibrio cholerae NOT DETECTED NOT DETECTED Final   Enteroaggregative E coli (EAEC) NOT DETECTED NOT DETECTED Final   Enteropathogenic E coli (EPEC) NOT DETECTED NOT DETECTED Final   Enterotoxigenic E coli (ETEC) NOT DETECTED NOT DETECTED Final   Shiga like toxin producing E coli (STEC) NOT DETECTED NOT DETECTED Final   Shigella/Enteroinvasive E coli (EIEC) NOT DETECTED NOT DETECTED Final   Cryptosporidium NOT DETECTED NOT DETECTED Final   Cyclospora cayetanensis NOT DETECTED NOT DETECTED Final   Entamoeba histolytica NOT DETECTED NOT DETECTED Final   Giardia lamblia NOT DETECTED NOT DETECTED Final   Adenovirus F40/41 NOT DETECTED NOT DETECTED Final   Astrovirus NOT DETECTED NOT DETECTED Final   Norovirus GI/GII DETECTED (A) NOT DETECTED Final    Comment: CRITICAL RESULT CALLED TO, READ BACK BY AND VERIFIED WITH: KATIE SILK AT 1548 ON 09/29/16 BY SNJ    Rotavirus A NOT DETECTED NOT DETECTED Final   Sapovirus (I, II, IV, and V) NOT DETECTED NOT DETECTED Final  C difficile quick scan w PCR reflex     Status: Abnormal   Collection Time: 09/28/16  3:20 PM  Result Value Ref Range Status   C Diff antigen POSITIVE (A) NEGATIVE Final   C Diff  toxin NEGATIVE NEGATIVE Final   C Diff interpretation Results are indeterminate. See PCR results.  Final  Clostridium Difficile by PCR     Status: Abnormal   Collection Time: 09/28/16  3:20 PM  Result Value Ref Range Status   Toxigenic C Difficile by pcr POSITIVE (A) NEGATIVE Final    Comment: Positive for toxigenic C. difficile with little to no toxin production. Only treat if clinical presentation suggests symptomatic illness.  Culture, Urine     Status: None   Collection Time: 10/01/16  8:14 AM  Result Value Ref Range Status   Specimen Description URINE, CLEAN CATCH  Final   Special Requests NONE  Final   Culture NO GROWTH Performed at [facility]   Final   Report Status 10/02/2016 FINAL  Final    Time coordinating discharge: Over 30 minutes  Hazeline Junker, MD  [facility] 10/04/2016, 3:47 PM Pager [phone]

## 2016-10-04 NOTE — Progress Notes (Signed)
Patient Name: Erin Gibson Date of Encounter: 10/04/2016  Primary Cardiologist: Dr. Herbie Baltimore this adm  Hospital Problem List     Principal Problem:   Norovirus Active Problems:   Essential hypertension   SBO (small bowel obstruction) (HCC)   UTI (urinary tract infection)   Nausea with vomiting   CKD (chronic kidney disease), stage III   Protein-calorie malnutrition, severe   Small bowel obstruction   Ileus (HCC)   Pulmonary hypertension   NSTEMI (non-ST elevated myocardial infarction) (HCC)   Congestive dilated cardiomyopathy (HCC)    Subjective   Rough night with abdominal pain overnight. No n/v. Tolerating breakfast this AM but does note SOB. Increased RR noted. Chest pain improved.  Inpatient Medications    . aspirin EC  81 mg Oral Daily  . carvedilol  6.25 mg Oral BID WC  . furosemide  60 mg Intravenous STAT  . isosorbide mononitrate  60 mg Oral Daily  . ranolazine  500 mg Oral BID    Vital Signs    Vitals:   10/03/16 2136 10/04/16 0425 10/04/16 0700 10/04/16 0928  BP: (!) 143/75 (!) 177/92 (!) 152/98 (!) 160/79  Pulse: 65 67  79  Resp: 20 20    Temp: 98.1 F (36.7 C) 98.2 F (36.8 C)    TempSrc: Oral Oral    SpO2: 98% 98%    Weight:      Height:        Intake/Output Summary (Last 24 hours) at 10/04/16 1050 Last data filed at 10/04/16 0947  Gross per 24 hour  Intake             2280 ml  Output             1300 ml  Net              980 ml   Filed Weights   09/28/16 1014 10/01/16 0118 10/01/16 0454  Weight: 107 lb (48.5 kg) 103 lb 13.4 oz (47.1 kg) 168 lb 14.4 oz (76.6 kg)    Physical Exam    General: Elderly WF speaking in slightly shorter sentences with mildly increased RR HEENT: Normocephalic, atraumatic, sclera non-icteric, no xanthomas, nares are without discharge. Neck: Negative for carotid bruits. JVP not elevated. Lungs: Decreased BS at bases with coarseness, no wheezing or rhonchi. Breathing is unlabored. Cardiac: RRR S1 S2, 2/6 SEM  at LSB. No rubs or gallops.  Abdomen: Soft, non-tender, non-distended with normoactive bowel sounds. No rebound/guarding. Extremities: No clubbing or cyanosis. No edema. Distal pedal pulses are 2+ and equal bilaterally. Skin: Warm and dry, no significant rash. Neuro: Alert and oriented X 3. Sensation in tact. Follows commands. Psych:  Responds to questions appropriately with a normal affect.  Labs    CBC  Recent Labs  10/03/16 0602 10/04/16 0518  WBC 4.4 5.2  HGB 11.1* 10.6*  HCT 34.5* 31.9*  MCV 85.0 84.2  PLT 169 161   Basic Metabolic Panel  Recent Labs  10/03/16 0602 10/04/16 0518  NA 139 134*  K 4.0 4.5  CL 115* 111  CO2 17* 17*  GLUCOSE 82 80  BUN 7 7  CREATININE 0.81 0.86  CALCIUM 8.9 8.9  MG 1.4* 1.8   Cardiac Enzymes  Recent Labs  10/01/16 1102 10/01/16 1719 10/01/16 2310  TROPONINI 1.37* 1.15* 0.90*     Telemetry    NSR occ PVCs  Radiology    Dg Abd 1 View  Result Date: 09/30/2016 CLINICAL DATA:  Abdominal pain. EXAM:  ABDOMEN - 1 VIEW COMPARISON:  September 29, 2016 FINDINGS: Nonobstructive bowel gas pattern. Air-filled nondilated loops of bowel seen diffusely throughout the abdomen. No other interval changes or acute abnormalities. IMPRESSION: Numerous air-filled nondilated loops of bowel could represent mild ileus. No evidence of obstruction. Electronically Signed   By: Gerome Sam III M.D   On: 09/30/2016 16:17   Ct Abdomen Pelvis W Contrast  Result Date: 09/28/2016 CLINICAL DATA:  83 year old female with left lower quadrant abdominal pain, nausea and vomiting for the past 2 days EXAM: CT ABDOMEN AND PELVIS WITH CONTRAST TECHNIQUE: Multidetector CT imaging of the abdomen and pelvis was performed using the standard protocol following bolus administration of intravenous contrast. CONTRAST:  80mL ISOVUE-300 IOPAMIDOL (ISOVUE-300) INJECTION 61% COMPARISON:  CT abdomen/ pelvis 06/05/2016 and 04/20/2010 FINDINGS: Lower chest: Greater than 6 year  stability of a 6 mm right lower lobe pulmonary nodule dating back to July of 2011. Stability is consistent with a benign process. A second small nodular opacity in the inferolateral aspect of the right lower lobe is also stable for more than 6 years and benign. Chronic elevation of the left hemidiaphragm with associated atelectasis. Calcifications of the aortic valve. The heart is normal in size. No pericardial effusion. Unremarkable distal thoracic esophagus. Hepatobiliary: Similar degree of biliary ductal dilatation over multiple prior studies dating back to 2011. No significant interval change. The distal common bile duct remains dilated at 1.3 cm prior to abruptly tapering at the level of the ampulla. New no visible choledocholithiasis or mass. Pancreas: Relative pancreatic atrophy in the body an neck with a visible but not particularly dilated pancreatic duct. Similar findings remain unchanged dating back to 2011. Spleen: Normal in size without focal abnormality. Adrenals/Urinary Tract: Stable 5.6 cm peripherally calcified cyst arising exophytic from the upper pole of the right kidney. Alternately, this could represent an exophytic cyst arising from hepatic segment 6 and abutting the kidney. In either event, there has been no interval change over multiple prior studies dating back to 2011. Mild renal parenchymal atrophy worse on the left than the right. There are several additional small circumscribed low-attenuation lesions which are too small to characterize but statistically highly likely benign cysts. No hydronephrosis, nephrolithiasis or enhancing mass. Stomach/Bowel: Very mild distention of multiple loops of fluid-filled small bowel throughout the abdomen without true obstruction or transition point. Similarly, the colon is filled with liquid stool including the rectum. There may be mild hyperenhancement of the mucosal surface of the rectum without associated submucosal edema or thickening. No free air or  free fluid. The left lower quadrant inguinal hernia contains trace fluid and a portion of the anterior wall of the sigmoid colon. This is a change compared to prior. Given the chronic presence of fluid an omental fat within the hernia sac, superimposed inflammatory changes difficult to assess. Vascular/Lymphatic: Mild fusiform aneurysmal dilatation of the infrarenal abdominal aorta with a maximal diameter of 3.2 cm. This is in significantly changed compared to prior. Scattered atherosclerotic vascular calcifications. 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 Reproductive: Surgically absent. Other: Left lower quadrant inguinal hernia containing herniated omental fat, fluid and the anterior wall of the adjacent sigmoid colon. Musculoskeletal: No acute or significant osseous findings. IMPRESSION: 1. Mildly distended and fluid-filled small bowel throughout the abdomen. No definite transition point. Differential considerations include viral enteritis versus partial small bowel obstruction. 2. Liquid stool noted throughout the colon. It is the patient also experiencing diarrhea? 3. Slight interval change in the appearance  of the left lower quadrant inguinal hernia which now contains a portion of the anterior wall of the adjacent sigmoid colon. However, there is no associated inflammation or evidence of obstruction related to this mild change. 4. Fusiform infrarenal abdominal aortic aneurysm measures 3.2 cm, unchanged. Recommend followup by ultrasound in 3 years. This recommendation follows ACR consensus guidelines: White Paper of the ACR Incidental Findings Committee II on Vascular Findings. J Am Coll Radiol 2013; 16:109-60410:789-794 5. Numerous additional ancillary findings as above without significant interval change over multiple prior studies. Electronically Signed   By: Malachy MoanHeath  McCullough M.D.   On: 09/28/2016 13:06   Dg Chest Port 1 View  Result Date: 09/30/2016 CLINICAL DATA:  Intermittent upper  chest pain with activity, accompanied by dyspnea. EXAM: PORTABLE CHEST 1 VIEW COMPARISON:  09/28/2016 FINDINGS: Shallow inspiration. Mild linear atelectatic appearing opacities in the bases, accentuated by the shallow degree of inspiration. No confluent consolidation. No large effusion. Normal pulmonary vasculature. IMPRESSION: Mild linear lung base opacities, accentuated by a shallow degree of inspiration. No consolidation or large effusion. Electronically Signed   By: Ellery Plunkaniel R Mitchell M.D.   On: 09/30/2016 00:57   Dg Chest Portable 1 View  Result Date: 09/28/2016 CLINICAL DATA:  Chest wall pain EXAM: PORTABLE CHEST 1 VIEW COMPARISON:  02/16/2016 FINDINGS: Cardiomediastinal silhouette is stable. Again noted thoracic dextroscoliosis. Stable mild ribcage asymmetry. Old right upper rib fractures are stable. Atherosclerotic calcifications of thoracic aorta. Extensive degenerative changes bilateral shoulders. IMPRESSION: No active disease. Electronically Signed   By: Natasha MeadLiviu  Pop M.D.   On: 09/28/2016 10:33   Dg Abd 2 Views  Result Date: 09/29/2016 CLINICAL DATA:  Diarrhea all night.  Diffuse abdominal discomfort. EXAM: ABDOMEN - 2 VIEW COMPARISON:  CT from yesterday FINDINGS: Nonobstructive bowel gas pattern. Oral contrast administered yesterday has reached the left colon. Contrast also seen in the urinary bladder. Peripherally calcified right renal cyst as described on previous CT. Few colonic and small bowel fluid levels without over distention to suggest obstruction. No evidence of pneumoperitoneum. Advanced lumbar disc degeneration and levoscoliosis. IMPRESSION: 1. Nonobstructive bowel gas pattern. Oral contrast from yesterday has reached the colon. 2. Scattered bowel fluid levels correlating with diarrhea history. Electronically Signed   By: Marnee SpringJonathon  Watts M.D.   On: 09/29/2016 09:36     Patient Profile     81F with elevated troponin 12/2015, mild-moderate AS/TR, CKD stage III, HTN, multiple abdominal  surgeries, and recurrent SBO admitted with Norovirus, developed CP and NSTEMI while admitted.  Assessment & Plan    1. Gastroenteritis - per IM.  2. NSTEMI with suspected CAD - s/p 48 hours of heparin. CP improved on current dose of Coreg and Imdur. Continue, can further titrate nitrate if needed. Plan is for home with hospice when deemed stable.  3. New cardiomyopathy/acute systolic CHF - EF now 35%, suspect ischemically mediated. Medical therapy has been focused on anti-ischemic rx first. This morning she is having some difficulty breathing with decreased BS and crackles. Will give IV Lasix 60mg  x 1 now and follow clinical response. Spoke with IM; d/c IVF today. May need maintenance diuretic which would also help BP.  4. HTN - labile, follow response to IV Lasix.   5. CKD III - stable.  6. Hypomagnesemia - being managed by IM. Suspect due to GI losses. Improved.  7. Mild-mod AS - follow clinically.  8. Anemia  - recent baseline appears 10-11.  Signed, Laurann Montanaayna N Dunn, PA-C  10/04/2016, 10:50 AM    I  have seen, examined and evaluated the patient this PM along with Ronie Spies, PA-C.  After reviewing all the available data and chart, we discussed the patients laboratory, study & physical findings as well as symptoms in detail. I agree with her findings, examination as well as impression recommendations as per our discussion.    She was sleeping when I went to go see her and still little bit dyspneic. Had yet to diuresis, but apparently has diuresed since and feels much better after being given Lasix. I do agree that we can probably discharge her on low dose of maybe 20 of Lasix standing and apical to use more for when necessary.  Her anginal symptoms have improved on Ranexa and beta blocker plus Imdur hopefully with additional volume removal, this will help some of her anginal symptoms as well.  Hopefully as she is back to eating and drinking we will build a Foley figure out what dose  of diuretic to have her on.  She should be ready for discharge to home hospice. We can arrange for follow-up in my clinic.    Bryan Lemma, M.D., M.S. Interventional Cardiologist   Pager # [phone] Phone # [phone] [address] [place], [place] [address]

## 2016-10-04 NOTE — Progress Notes (Signed)
PT Cancellation Note  Patient Details Name: Erin Gibson MRN: [id number] DOB: 08/14/1919   Cancelled Treatment:     Pt going downstairs for a chest X Ray.  Will check back later as schedule permits.   Felecia ShellingLori Tayleigh Gibson  PTA [facility]  Acute  Rehab Pager      [phone]

## 2016-10-04 NOTE — Care Management Note (Signed)
Case Management Note  Patient Details  Name: Dawayne CirriViolet W Cockerell MRN: [id number] Date of Birth: 08/11/1919  Subjective/Objective:  D/c home w/hospice-[facility]-rep Bambi have spoken to dtr-home visit tomorrow. No further CM needs.                  Action/Plan:dc home w/hospice.   Expected Discharge Date:   (UNKNOWN)               Expected Discharge Plan:  Home w Hospice Care  In-House Referral:     Discharge planning Services  CM Consult  Post Acute Care Choice:    Choice offered to:  Adult Children  DME Arranged:    DME Agency:     HH Arranged:  RN HH Agency:  Other - See comment [facility])  Status of Service:  Completed, signed off  If discussed at Long Length of Stay Meetings, dates discussed:    Additional Comments:  Lanier ClamMahabir, Antanette Richwine, RN 10/04/2016, 2:02 PM

## 2016-10-04 NOTE — Progress Notes (Addendum)
Pt complains of persistent abdominal pain with tightness without relief from ordered pain medications. Distention noted. No current nausea or vomiting. Pt refusing NG tube. On call NP made aware. Will continue to monitor.

## 2016-10-05 LAB — BASIC METABOLIC PANEL
Anion gap: 6 (ref 5–15)
BUN: 8 mg/dL (ref 6–20)
CALCIUM: 9.3 mg/dL (ref 8.9–10.3)
CHLORIDE: 108 mmol/L (ref 101–111)
CO2: 23 mmol/L (ref 22–32)
CREATININE: 1.1 mg/dL — AB (ref 0.44–1.00)
GFR calc non Af Amer: 41 mL/min — ABNORMAL LOW (ref >60)
GFR, EST AFRICAN AMERICAN: 48 mL/min — AB (ref >60)
Glucose, Bld: 90 mg/dL (ref 65–99)
Potassium: 3.9 mmol/L (ref 3.5–5.1)
SODIUM: 137 mmol/L (ref 135–145)

## 2016-10-05 LAB — CBC
HCT: 32.2 % — ABNORMAL LOW (ref 36.0–46.0)
Hemoglobin: 10.8 g/dL — ABNORMAL LOW (ref 12.0–15.0)
MCH: 28.1 pg (ref 26.0–34.0)
MCHC: 33.5 g/dL (ref 30.0–36.0)
MCV: 83.9 fL (ref 78.0–100.0)
PLATELETS: 173 10*3/uL (ref 150–400)
RBC: 3.84 MIL/uL — AB (ref 3.87–5.11)
RDW: 14.8 % (ref 11.5–15.5)
WBC: 5.9 10*3/uL (ref 4.0–10.5)

## 2016-10-05 NOTE — Care Management Note (Signed)
Case Management Note  Patient Details  Name: Dawayne CirriViolet W Jenison MRN: [id number] Date of Birth: 11/07/1918  Subjective/Objective:  Spoke to dtr Dondra SpryGail [phone] about d/c plans-she says she didn't know when d/c was going to be yesterday so she had already told the private help to leave & did not have the private help available when they called @ 4p. Patient has 24hr caregiver service set up for today-Please call dtr Dondra SpryGail prior to ambulance so she can have private care giver-Trisha to meet @ house timely. [facility] ambulance [phone](Nurse to call when patient ready for transp home) fomrs in shadow chart. Home hospice [facility] Bambi will manage all arrangements for home hospice directly w/dtr Gail-all in agreement to d/c plan. No further CM needs.                  Action/Plan:d/c home w/hospice.   Expected Discharge Date:   (UNKNOWN)               Expected Discharge Plan:  Home w Hospice Care  In-House Referral:     Discharge planning Services  CM Consult  Post Acute Care Choice:    Choice offered to:  Adult Children  DME Arranged:    DME Agency:     HH Arranged:  RN HH Agency:  Other - See comment [facility])  Status of Service:  Completed, signed off  If discussed at Long Length of Stay Meetings, dates discussed:    Additional Comments:  Lanier ClamMahabir, Yaira Bernardi, RN 10/05/2016, 10:10 AM

## 2016-10-05 NOTE — Progress Notes (Signed)
Discharge instructions given to daughter Erin Gibson via telephone.  Erin Gibson verbalized understanding of these instructions.  Erin Gibson states that there is an aide ready at the house.  [facility] has been called.

## 2016-10-05 NOTE — Progress Notes (Addendum)
Ms. Freida Busmanllen appears to be much more comfortable today. D/w Dr. Jarvis NewcomerGrunz -> plan for DC today. I arranged f/u with Dr. Herbie BaltimoreHarding. Info is on AVS.  Also informed nurse (she had not printed AVS yet). Shemica Meath PA-C

## 2016-10-05 NOTE — Progress Notes (Addendum)
Patient scheduled for discharge yesterday but couldn't arrange home caretaker/transportation. Seen and examined this morning. Patient stable without events overnight. Wants to go home, has stable dyspnea and no further episodes of abdominal pain or diarrhea. On exam she appears comfortable and in no distress, non-labored breathing with trace bibasilar crackles improved from yesterday. No LE edema.   Plan remains for discharge today to home w/24hr supervision and hospice services. Discussed w/cardiology this AM. See discharge summary from 10/04/2016 for full information.   Hazeline Junkeryan Kataleya Zaugg, MD 10/05/2016 10:03 AM

## 2016-10-08 ENCOUNTER — Telehealth: Payer: Self-pay | Admitting: Internal Medicine

## 2016-10-08 DIAGNOSIS — K219 Gastro-esophageal reflux disease without esophagitis: Secondary | ICD-10-CM | POA: Diagnosis not present

## 2016-10-08 DIAGNOSIS — I5032 Chronic diastolic (congestive) heart failure: Secondary | ICD-10-CM | POA: Diagnosis not present

## 2016-10-08 DIAGNOSIS — I129 Hypertensive chronic kidney disease with stage 1 through stage 4 chronic kidney disease, or unspecified chronic kidney disease: Secondary | ICD-10-CM | POA: Diagnosis not present

## 2016-10-08 NOTE — Telephone Encounter (Signed)
° ° ° ° °  Erin ChessmanSuzanne from [facility] call to say pt was admitted today to Hospice at her home. Asking for verbal orders to start oxygen   Would like a call back     [phone]

## 2016-10-08 NOTE — Telephone Encounter (Signed)
Ok to give verbal orders? Please advise  

## 2016-10-08 NOTE — Telephone Encounter (Signed)
Okay for oxygen therapy

## 2016-10-09 ENCOUNTER — Ambulatory Visit: Payer: Medicare Other | Admitting: Internal Medicine

## 2016-10-09 NOTE — Telephone Encounter (Signed)
Left message on voicemail to call office.  

## 2016-10-09 NOTE — Telephone Encounter (Signed)
Spoke with Rosalita ChessmanSuzanne from [facility] to give verbal orders to start oxygen therapy, per Dr. kKirtland Bouchard

## 2016-10-11 ENCOUNTER — Ambulatory Visit: Payer: Medicare Other | Admitting: Cardiology

## 2016-10-12 DIAGNOSIS — I129 Hypertensive chronic kidney disease with stage 1 through stage 4 chronic kidney disease, or unspecified chronic kidney disease: Secondary | ICD-10-CM | POA: Diagnosis not present

## 2016-10-12 DIAGNOSIS — I5032 Chronic diastolic (congestive) heart failure: Secondary | ICD-10-CM | POA: Diagnosis not present

## 2016-10-12 DIAGNOSIS — K219 Gastro-esophageal reflux disease without esophagitis: Secondary | ICD-10-CM

## 2016-10-15 ENCOUNTER — Ambulatory Visit: Payer: Medicare Other | Admitting: Physician Assistant

## 2016-10-16 DIAGNOSIS — K219 Gastro-esophageal reflux disease without esophagitis: Secondary | ICD-10-CM | POA: Diagnosis not present

## 2016-10-16 DIAGNOSIS — I5032 Chronic diastolic (congestive) heart failure: Secondary | ICD-10-CM | POA: Diagnosis not present

## 2016-10-16 DIAGNOSIS — I129 Hypertensive chronic kidney disease with stage 1 through stage 4 chronic kidney disease, or unspecified chronic kidney disease: Secondary | ICD-10-CM | POA: Diagnosis not present

## 2016-10-17 ENCOUNTER — Other Ambulatory Visit: Payer: Self-pay | Admitting: Internal Medicine

## 2016-10-18 DIAGNOSIS — K219 Gastro-esophageal reflux disease without esophagitis: Secondary | ICD-10-CM | POA: Diagnosis not present

## 2016-10-18 DIAGNOSIS — I5032 Chronic diastolic (congestive) heart failure: Secondary | ICD-10-CM | POA: Diagnosis not present

## 2016-10-18 DIAGNOSIS — I129 Hypertensive chronic kidney disease with stage 1 through stage 4 chronic kidney disease, or unspecified chronic kidney disease: Secondary | ICD-10-CM | POA: Diagnosis not present

## 2016-10-25 DIAGNOSIS — I5032 Chronic diastolic (congestive) heart failure: Secondary | ICD-10-CM | POA: Diagnosis not present

## 2016-10-25 DIAGNOSIS — K219 Gastro-esophageal reflux disease without esophagitis: Secondary | ICD-10-CM | POA: Diagnosis not present

## 2016-10-25 DIAGNOSIS — I129 Hypertensive chronic kidney disease with stage 1 through stage 4 chronic kidney disease, or unspecified chronic kidney disease: Secondary | ICD-10-CM | POA: Diagnosis not present

## 2016-11-01 DIAGNOSIS — K219 Gastro-esophageal reflux disease without esophagitis: Secondary | ICD-10-CM | POA: Diagnosis not present

## 2016-11-01 DIAGNOSIS — I5032 Chronic diastolic (congestive) heart failure: Secondary | ICD-10-CM | POA: Diagnosis not present

## 2016-11-01 DIAGNOSIS — I129 Hypertensive chronic kidney disease with stage 1 through stage 4 chronic kidney disease, or unspecified chronic kidney disease: Secondary | ICD-10-CM | POA: Diagnosis not present

## 2016-11-04 DIAGNOSIS — I5032 Chronic diastolic (congestive) heart failure: Secondary | ICD-10-CM | POA: Diagnosis not present

## 2016-11-04 DIAGNOSIS — I129 Hypertensive chronic kidney disease with stage 1 through stage 4 chronic kidney disease, or unspecified chronic kidney disease: Secondary | ICD-10-CM | POA: Diagnosis not present

## 2016-11-04 DIAGNOSIS — K219 Gastro-esophageal reflux disease without esophagitis: Secondary | ICD-10-CM | POA: Diagnosis not present

## 2016-11-06 DIAGNOSIS — I129 Hypertensive chronic kidney disease with stage 1 through stage 4 chronic kidney disease, or unspecified chronic kidney disease: Secondary | ICD-10-CM | POA: Diagnosis not present

## 2016-11-06 DIAGNOSIS — I5032 Chronic diastolic (congestive) heart failure: Secondary | ICD-10-CM | POA: Diagnosis not present

## 2016-11-06 DIAGNOSIS — K219 Gastro-esophageal reflux disease without esophagitis: Secondary | ICD-10-CM | POA: Diagnosis not present

## 2016-11-09 DIAGNOSIS — K219 Gastro-esophageal reflux disease without esophagitis: Secondary | ICD-10-CM | POA: Diagnosis not present

## 2016-11-09 DIAGNOSIS — I129 Hypertensive chronic kidney disease with stage 1 through stage 4 chronic kidney disease, or unspecified chronic kidney disease: Secondary | ICD-10-CM | POA: Diagnosis not present

## 2016-11-09 DIAGNOSIS — I5032 Chronic diastolic (congestive) heart failure: Secondary | ICD-10-CM | POA: Diagnosis not present

## 2016-11-13 DIAGNOSIS — I129 Hypertensive chronic kidney disease with stage 1 through stage 4 chronic kidney disease, or unspecified chronic kidney disease: Secondary | ICD-10-CM | POA: Diagnosis not present

## 2016-11-13 DIAGNOSIS — K219 Gastro-esophageal reflux disease without esophagitis: Secondary | ICD-10-CM | POA: Diagnosis not present

## 2016-11-13 DIAGNOSIS — I5032 Chronic diastolic (congestive) heart failure: Secondary | ICD-10-CM | POA: Diagnosis not present

## 2016-11-14 DIAGNOSIS — I5032 Chronic diastolic (congestive) heart failure: Secondary | ICD-10-CM | POA: Diagnosis not present

## 2016-11-14 DIAGNOSIS — K219 Gastro-esophageal reflux disease without esophagitis: Secondary | ICD-10-CM | POA: Diagnosis not present

## 2016-11-14 DIAGNOSIS — I129 Hypertensive chronic kidney disease with stage 1 through stage 4 chronic kidney disease, or unspecified chronic kidney disease: Secondary | ICD-10-CM | POA: Diagnosis not present

## 2016-11-16 ENCOUNTER — Other Ambulatory Visit: Payer: Self-pay | Admitting: Internal Medicine

## 2016-11-16 DIAGNOSIS — I5032 Chronic diastolic (congestive) heart failure: Secondary | ICD-10-CM | POA: Diagnosis not present

## 2016-11-16 DIAGNOSIS — K219 Gastro-esophageal reflux disease without esophagitis: Secondary | ICD-10-CM | POA: Diagnosis not present

## 2016-11-16 DIAGNOSIS — I129 Hypertensive chronic kidney disease with stage 1 through stage 4 chronic kidney disease, or unspecified chronic kidney disease: Secondary | ICD-10-CM | POA: Diagnosis not present

## 2016-11-16 MED ORDER — POLYETHYLENE GLYCOL 3350 17 GM/SCOOP PO POWD: ORAL | 5 refills | Status: AC

## 2016-11-19 ENCOUNTER — Telehealth: Payer: Self-pay | Admitting: Cardiology

## 2016-11-19 NOTE — Telephone Encounter (Signed)
Spoke to daughter   daughter states patient has been out of medication @ 2 weeks Ranexa  and carvedilol . She states hospice does thing patient needs medication.   Per daughter , patient has improved since hospitalization in 09/2016. She eating /drinking with our difficulty , walikng as well.   daugther states patient anxiety level has increase since she aware not taking medication.  patient has initial office appointment with Dr Herbie BaltimoreHARDING on November 30 2016  aware will defer to Dr Herbie BaltimoreHARDING

## 2016-11-19 NOTE — Telephone Encounter (Signed)
New message    Pt daughter calling. She said she is not sure if mother should continue this medications. Pt is on palliative care and they state she does not need these medications. She is calling to find out if her mother should still take this medication and if so can refills be sent?   *STAT* If patient is at the pharmacy, call can be transferred to refill team.   1. Which medications need to be refilled? (please list name of each medication and dose if known) Ranexa 500 mg Carvedilol 6.25 mg  2. Which pharmacy/location (including street and city if local pharmacy) is medication to be sent to? [facility] on [address].   3. Do they need a 30 day or 90 day supply? 90 day

## 2016-11-21 DIAGNOSIS — K219 Gastro-esophageal reflux disease without esophagitis: Secondary | ICD-10-CM | POA: Diagnosis not present

## 2016-11-21 DIAGNOSIS — I5032 Chronic diastolic (congestive) heart failure: Secondary | ICD-10-CM | POA: Diagnosis not present

## 2016-11-21 DIAGNOSIS — I129 Hypertensive chronic kidney disease with stage 1 through stage 4 chronic kidney disease, or unspecified chronic kidney disease: Secondary | ICD-10-CM | POA: Diagnosis not present

## 2016-11-21 MED ORDER — RANOLAZINE ER 500 MG PO TB12: 500.0000 mg | ORAL_TABLET | Freq: Two times a day (BID) | ORAL | 6 refills | Status: AC

## 2016-11-21 NOTE — Telephone Encounter (Signed)
Spoke daughter Tana Conch_Gail  Bence ranexa e-sent to pharmacy #30 day supply. will make decision to continue or discontinue at at appointment. Carvedilol will discuss whether to restart medication at office appointment.  Daughter verbalized understanding.

## 2016-11-21 NOTE — Telephone Encounter (Signed)
Again, I think Ranexa would be helpful for angina relief if she was still having angina. Can probably do away with carvedilol.  If she is no longer having angina, I think are okay without Ranexa as well, but she was having significant angina in the hospital. Ranexa would therefore go along with palliative management.  Bryan Lemmaavid Kiptyn Rafuse, MD

## 2016-11-22 DIAGNOSIS — I129 Hypertensive chronic kidney disease with stage 1 through stage 4 chronic kidney disease, or unspecified chronic kidney disease: Secondary | ICD-10-CM | POA: Diagnosis not present

## 2016-11-22 DIAGNOSIS — I5032 Chronic diastolic (congestive) heart failure: Secondary | ICD-10-CM | POA: Diagnosis not present

## 2016-11-22 DIAGNOSIS — K219 Gastro-esophageal reflux disease without esophagitis: Secondary | ICD-10-CM | POA: Diagnosis not present

## 2016-11-23 ENCOUNTER — Other Ambulatory Visit: Payer: Self-pay | Admitting: Internal Medicine

## 2016-11-24 DIAGNOSIS — I5032 Chronic diastolic (congestive) heart failure: Secondary | ICD-10-CM | POA: Diagnosis not present

## 2016-11-24 DIAGNOSIS — I129 Hypertensive chronic kidney disease with stage 1 through stage 4 chronic kidney disease, or unspecified chronic kidney disease: Secondary | ICD-10-CM | POA: Diagnosis not present

## 2016-11-24 DIAGNOSIS — K219 Gastro-esophageal reflux disease without esophagitis: Secondary | ICD-10-CM | POA: Diagnosis not present

## 2016-11-26 DIAGNOSIS — K219 Gastro-esophageal reflux disease without esophagitis: Secondary | ICD-10-CM | POA: Diagnosis not present

## 2016-11-26 DIAGNOSIS — I5032 Chronic diastolic (congestive) heart failure: Secondary | ICD-10-CM | POA: Diagnosis not present

## 2016-11-26 DIAGNOSIS — I129 Hypertensive chronic kidney disease with stage 1 through stage 4 chronic kidney disease, or unspecified chronic kidney disease: Secondary | ICD-10-CM | POA: Diagnosis not present

## 2016-11-30 ENCOUNTER — Encounter: Payer: Self-pay | Admitting: Cardiology

## 2016-11-30 ENCOUNTER — Ambulatory Visit (INDEPENDENT_AMBULATORY_CARE_PROVIDER_SITE_OTHER): Payer: Medicare Other | Admitting: Cardiology

## 2016-11-30 ENCOUNTER — Encounter: Payer: Self-pay | Admitting: Internal Medicine

## 2016-11-30 ENCOUNTER — Ambulatory Visit (INDEPENDENT_AMBULATORY_CARE_PROVIDER_SITE_OTHER): Admitting: Internal Medicine

## 2016-11-30 DIAGNOSIS — I10 Essential (primary) hypertension: Secondary | ICD-10-CM

## 2016-11-30 DIAGNOSIS — I272 Pulmonary hypertension, unspecified: Secondary | ICD-10-CM | POA: Diagnosis not present

## 2016-11-30 DIAGNOSIS — I209 Angina pectoris, unspecified: Secondary | ICD-10-CM

## 2016-11-30 DIAGNOSIS — R309 Painful micturition, unspecified: Secondary | ICD-10-CM | POA: Diagnosis not present

## 2016-11-30 DIAGNOSIS — I129 Hypertensive chronic kidney disease with stage 1 through stage 4 chronic kidney disease, or unspecified chronic kidney disease: Secondary | ICD-10-CM | POA: Diagnosis not present

## 2016-11-30 DIAGNOSIS — R112 Nausea with vomiting, unspecified: Secondary | ICD-10-CM | POA: Diagnosis not present

## 2016-11-30 DIAGNOSIS — I350 Nonrheumatic aortic (valve) stenosis: Secondary | ICD-10-CM

## 2016-11-30 DIAGNOSIS — R110 Nausea: Secondary | ICD-10-CM | POA: Diagnosis not present

## 2016-11-30 DIAGNOSIS — I420 Dilated cardiomyopathy: Secondary | ICD-10-CM | POA: Diagnosis not present

## 2016-11-30 DIAGNOSIS — I214 Non-ST elevation (NSTEMI) myocardial infarction: Secondary | ICD-10-CM | POA: Diagnosis not present

## 2016-11-30 DIAGNOSIS — K219 Gastro-esophageal reflux disease without esophagitis: Secondary | ICD-10-CM | POA: Diagnosis not present

## 2016-11-30 DIAGNOSIS — I5032 Chronic diastolic (congestive) heart failure: Secondary | ICD-10-CM | POA: Diagnosis not present

## 2016-11-30 LAB — POC URINALSYSI DIPSTICK (AUTOMATED)
BILIRUBIN UA: NEGATIVE
GLUCOSE UA: NEGATIVE
KETONES UA: NEGATIVE
Nitrite, UA: NEGATIVE
Protein, UA: NEGATIVE
Spec Grav, UA: 1.015
Urobilinogen, UA: 0.2
pH, UA: 7

## 2016-11-30 MED ORDER — CIPROFLOXACIN HCL 500 MG PO TABS
500.0000 mg | ORAL_TABLET | Freq: Two times a day (BID) | ORAL | 0 refills | Status: AC
Start: 2016-11-30 — End: ?

## 2016-11-30 MED ORDER — CARVEDILOL 6.25 MG PO TABS: 6.2500 mg | ORAL_TABLET | Freq: Two times a day (BID) | ORAL | 6 refills | Status: AC

## 2016-11-30 MED ORDER — MORPHINE SULFATE 10 MG/5ML PO SOLN: 2.5000 mg | ORAL | 0 refills | Status: AC | PRN

## 2016-11-30 MED ORDER — ONDANSETRON HCL 4 MG PO TABS: 4.0000 mg | ORAL_TABLET | Freq: Three times a day (TID) | ORAL | 0 refills | Status: AC | PRN

## 2016-11-30 MED ORDER — PROMETHAZINE HCL 25 MG/ML IJ SOLN
25.0000 mg | Freq: Once | INTRAMUSCULAR | Status: AC
  Administered 2016-11-30: 25 mg via INTRAMUSCULAR

## 2016-11-30 MED ORDER — MORPHINE SULFATE 10 MG/5ML PO SOLN: 2.5000 mg | ORAL | 0 refills | Status: DC | PRN

## 2016-11-30 NOTE — Progress Notes (Signed)
Pre visit review using our clinic review tool, if applicable. No additional management support is needed unless otherwise documented below in the visit note. 

## 2016-11-30 NOTE — Progress Notes (Signed)
PCP: Erin Boga, MD  Clinic Note: Chief Complaint  Patient presents with  . Hospitalization Follow-up    Cardiomyopathy (anterior MI versus Takotsubo). Also aortic valve disease  . Chest Pain    Chronic/intractable    HPI: Erin Gibson is a 83 y.o. female with a PMH below who presents today for hospital follow-up -- .  Erin Gibson was seen in consultation during an admission back in January 2018 where she was found to have gastroenteritis and small bowel obstruction, acute by UTI. In this setting she had complications of elevated troponin levels with anterior wall motion abnormality on echocardiogram. She had pretty significant anginal symptoms that were difficult control. She made it very clear that she would not be interested in any invasive procedures. Therefore we opted for medical management. She was started on Ranexa and carvedilol. Basically because of intractable angina, she was discharged home with home hospice.  Recent Hospitalizations: 01/05-07/2017 - end up being positive for normal virus and mild ileus/bowel obstruction. Complicated by what seems like either Takotsubo versus anterior MI. Based on persistent anginal symptoms, would suspect that she has significant CAD.  - Interestingly, with home hospice, there are some questions about her medications/medication requirement. I was asked about the necessity for Ranexa and carvedilol. I was not fully understanding what was asked, I indicated that Ranexa was deathly helping angina, and I felt like carvedilol would be to. I left this up to the discretion of the hospice team.  Studies Reviewed:   2-D echo January 2018: EF roughly 35% with anterior apical akinesis and aneurysmal dilation of the distal inferior septal walls. Significant change from April 2017. Only grade 1 diastolic dysfunction noted. Likely moderate if not severe aortic stenosis. Difficult to assess with reduced EF. Mild to moderate tricuspid  regurgitation. Mildly elevated peak PA pressures.  Interval History: Erin Gibson presents today actually doing okay. She was doing wonderfully while still on carvedilol and Ranexa, not having any significant anginal pains with the amount of activity she is able to do around the house. She does not do much. She has not had PND or orthopnea -- does not sleep lying flat.  She does have exertional dyspnea and chest pain now that she is stopping beta blocker. Her symptoms did improve somewhat after restarting Ranexa however.   She is not noticing any rapid irregular heartbeats or palpitations.  She had a pretty significant anxiety attack today is frustrated that she was not able to do put her sweater on - discussed her very upset and frustrated because she thought she was running late.  No syncope/near syncope. No TIA/amaurosis fugax symptoms. No melena, hematochezia, hematuria, or epstaxis. No claudication.  ROS: A comprehensive was performed. Review of Systems  Constitutional: Positive for malaise/fatigue and weight loss.       No energy. Not eating well.  HENT: Negative for nosebleeds.   Respiratory: Positive for shortness of breath.   Cardiovascular: Positive for chest pain.       Per history of present illness  Genitourinary: Positive for frequency. Negative for hematuria.  Musculoskeletal: Positive for joint pain. Negative for falls.  Skin: Negative.   Neurological: Positive for dizziness and weakness. Negative for focal weakness.  Psychiatric/Behavioral: Positive for memory loss. The patient is nervous/anxious (Has significant anxiety attacks. Made worse with UTI or other illnesses.).     Past Medical History:  Diagnosis Date  . ANEMIA 03/31/2009  . Chronic kidney disease   . DEGENERATIVE JOINT DISEASE, GENERALIZED 05/06/2007  .  Diverticulosis of sigmoid colon    Colonoscopy 2011  . DVT (deep venous thrombosis) (HCC)    hx of  . GERD 04/24/2007  . HYPERTENSION 04/24/2007  . VENOUS  STASIS ULCER 07/20/2008    Past Surgical History:  Procedure Laterality Date  . ABDOMINAL HYSTERECTOMY     Completion hysterectomy  . CATARACT EXTRACTION    . CHOLECYSTECTOMY OPEN    . HERNIA REPAIR    . PARTIAL HYSTERECTOMY     Salpingo-oophorectomy as well  . TOTAL KNEE ARTHROPLASTY     x2    Current Meds  Medication Sig  . acetaminophen (TYLENOL) 325 MG tablet Take 2 tablets (650 mg total) by mouth every 4 (four) hours as needed for mild pain or moderate pain. (Patient taking differently: Take 650 mg by mouth at bedtime. )  . aspirin EC 81 MG tablet Take 81 mg by mouth every morning.  . carvedilol (COREG) 6.25 MG tablet Take 1 tablet (6.25 mg total) by mouth 2 (two) times daily with a meal.  . clonazePAM (KLONOPIN) 0.5 MG tablet TAKE 1/2 TABLET BY MOUTH 3 TIMES A DAY AS NEEDED FOR ANXIETY  . Cyanocobalamin (VITAMIN B-12) 2500 MCG SUBL Place 2,500 mcg under the tongue every morning.   . furosemide (LASIX) 40 MG tablet Take 20 mg by mouth daily.  . isosorbide mononitrate (IMDUR) 30 MG 24 hr tablet Take 2 tablets (60 mg total) by mouth daily.  . Melatonin 3 MG TABS Take 3 mg by mouth at bedtime.  . Multiple Vitamins-Minerals (PRESERVISION AREDS 2) CAPS Take 1 capsule by mouth daily.  . nitroGLYCERIN (NITROSTAT) 0.4 MG SL tablet Place 1 tablet (0.4 mg total) under the tongue every 5 (five) minutes as needed for chest pain.  Bertram Gala. Polyethyl Glycol-Propyl Glycol (SYSTANE) 0.4-0.3 % SOLN Place 2 drops into both eyes 2 (two) times daily.  . polyethylene glycol powder (GLYCOLAX/MIRALAX) powder MIX 17GM IN LIQUID AND     DRINK TWO TIMES A DAY  . ranolazine (RANEXA) 500 MG 12 hr tablet Take 1 tablet (500 mg total) by mouth 2 (two) times daily.  . sertraline (ZOLOFT) 25 MG tablet Take 1 tablet (25 mg total) by mouth daily.  . [DISCONTINUED] carvedilol (COREG) 6.25 MG tablet Take 1 tablet (6.25 mg total) by mouth 2 (two) times daily with a meal.  . [DISCONTINUED] morphine 10 MG/5ML solution Take  1.3 mLs (2.6 mg total) by mouth every 2 (two) hours as needed (dyspnea, chest pain).    Allergies  Allergen Reactions  . Tramadol Other (See Comments)    Auditory and visual hallucinations  . Codeine Nausea And Vomiting    Violently ill  . Ibuprofen Nausea And Vomiting  . Diltiazem Hcl Other (See Comments)    Not sure about this reaction    Social History   Social History  . Marital status: Widowed    Spouse name: N/A  . Number of children: N/A  . Years of education: N/A   Social History Main Topics  . Smoking status: Never Smoker  . Smokeless tobacco: Never Used  . Alcohol use No  . Drug use: No  . Sexual activity: Not Asked   Other Topics Concern  . None   Social History Narrative  . None    family history includes Heart attack in her sister.  Wt Readings from Last 3 Encounters:  11/30/16 46.6 kg (102 lb 12.8 oz)  11/30/16 46.6 kg (102 lb 12.8 oz)  10/05/16 46.5 kg (102 lb 9.6 oz)  PHYSICAL EXAM BP (!) 188/90 (BP Location: Left Arm)   Pulse 78   Ht 4\' 11"  (1.499 m)   Wt 46.6 kg (102 lb 12.8 oz)   BMI 20.76 kg/m  General appearance: Very thin, frail elderly woman who has almost some temporal wasting. She is sitting in a wheelchair. She is very pleasant with normal mood and affect. Somewhat hard of hearing. Also poor historian. Neck: no adenopathy, no carotid bruit and mildly elevated JVD Lungs: clear to auscultation bilaterally, normal percussion bilaterally and non-labored Heart: RRR with normal S1 and a split S2. She does have an aortic ejection SEM 1-2/6 at RUSB radiating to carotids. Heard throughout however. Nondisplaced PMI just hyperdynamic based on lack of musculature. Abdomen: soft, non-tender; bowel sounds normal; no masses,  no organomegaly;  Extremities: extremities normal, atraumatic, no cyanosis, and no edema  Pulses: 2+ and symmetric - diminished pedal pulses, but still palpable Skin: Thin and frail skin. Mild bruising. No other rashes or  lesions. Neurologic: Mental status: Alert, oriented, thought content appropriate   Adult ECG Report Not checked  Other studies Reviewed: Additional studies/ records that were reviewed today include:  Recent Labs:   Lab Results  Component Value Date   CREATININE 1.10 (H) 10/05/2016   BUN 8 10/05/2016   NA 137 10/05/2016   K 3.9 10/05/2016   CL 108 10/05/2016   CO2 23 10/05/2016    ASSESSMENT / PLAN: Problem List Items Addressed This Visit    Angina, class III (HCC) (Chronic)    In the hospital, she had probably class IV angina with just minimal exertion. Now it is somewhat improved on medical management, but definitely was better off with a combination of carvedilol and Ranexa. We did restart her Ranexa to go along with the Imdur. However her blood pressure is extremely high and I would like to restart carvedilol is that probably had a major role in helping her anginal symptoms. Again angina is pain and therefore adequate antianginal treatment is palliative. -- Standard of care treatment for angina would be beta blocker nitrate Ranexa plus or minus calcium channel blocker.      Relevant Medications   furosemide (LASIX) 40 MG tablet   carvedilol (COREG) 6.25 MG tablet   Congestive dilated cardiomyopathy (HCC) (Chronic)    He is on a standing dose of Lasix. We discussed sliding scale. No real PND or orthopnea      Relevant Medications   furosemide (LASIX) 40 MG tablet   carvedilol (COREG) 6.25 MG tablet   Moderate aortic stenosis (Chronic)    Heart is tell how significant the stenosis is because of the echocardiogram was unhelpful. - At least moderate if not moderate to severe. I doubt it got too much worse from 2017 until now. At this point unless the murmur worsens her symptoms worsen, I would be reluctant to continue to follow. No plans for invasive management. Would simply control blood pressure and symptoms.      Relevant Medications   furosemide (LASIX) 40 MG tablet    carvedilol (COREG) 6.25 MG tablet   NSTEMI (non-ST elevated myocardial infarction) [facility])    Presumably she had an MI in the hospital. Very difficult to tell however because we don't have any way of doing a ischemic evaluation. No reason to a Myoview if would not consider proceed with cardiac catheterization. Plan for now is medical management.  She is on palliative care/hospice care. --I however think that treating her anginal symptoms is palliative care. I therefore  would like to restart her carvedilol that she can titrate back up to a full dose after a week (she would start with half a dose twice a day for the first week). She is back on her Ranexa and back on Imdur.  On aspirin alone. No need to start a statin at this point.      Relevant Medications   furosemide (LASIX) 40 MG tablet   carvedilol (COREG) 6.25 MG tablet   Pulmonary hypertension    Probably secondary to hypertension weekly. She probably has pretty elevated LVEDP. Plan would be to restart carvedilol for additional blood pressure control which will help her dyspnea symptoms.      Relevant Medications   furosemide (LASIX) 40 MG tablet   carvedilol (COREG) 6.25 MG tablet     Very detailed clinic visit -- we took a lot of time to discuss the family's concerns about her management by hospice. I think is a disconnect between symptom management and overmedication. I explained my feelings on palliative care and the fact that I think treating anginal symptom is related to palliative care. Therefore we are restarting her medications that she was discharged on. I made a promise to her in the hospital that I would follow her for her symptoms and will continue to do so.  ~30 min spent with the patient, > 50% of the time in discussion/counseling.   Current medicines are reviewed at length with the patient today. (+/- concerns) Her daughter & the RN accompanying her asked about restarting Coreg. The following changes have been made:  Yes  Patient Instructions  MEDICATION CHANGE   CONTINUE RANEXA 500 MG TWICE A DAY AND  RESTART  CARVEDILOL 6.25 MG TWICE A DAY  THE FIRST WEEK OF CARVEDILOL ( COREG )TAKE 1/2 ATABLET TWICE A DAY THEN RETURN TO 1 TABLET TWICE A DAY    NO OTHER CHANGES    Your physician recommends that you schedule a follow-up appointment in 2- 3 MONTHS WITH DR Chaniya Genter.     If you need a refill on your cardiac medications before your next appointment, please call your pharmacy.    Studies Ordered:   No orders of the defined types were placed in this encounter.     Bryan Lemma, M.D., M.S. Interventional Cardiologist   Pager # [phone] Phone # [phone] [address] [place], [place] [address]

## 2016-11-30 NOTE — Patient Instructions (Signed)
MEDICATION CHANGE   CONTINUE RANEXA 500 MG TWICE A DAY AND  RESTART  CARVEDILOL 6.25 MG TWICE A DAY  THE FIRST WEEK OF CARVEDILOL ( COREG )TAKE 1/2 ATABLET TWICE A DAY THEN RETURN TO 1 TABLET TWICE A DAY    NO OTHER CHANGES    Your physician recommends that you schedule a follow-up appointment in 2- 3 MONTHS WITH DR HARDING.     If you need a refill on your cardiac medications before your next appointment, please call your pharmacy.

## 2016-11-30 NOTE — Progress Notes (Signed)
Subjective:    Patient ID: Erin Gibson, female    DOB: 1919-07-20, 83 y.o.   MRN: [id number]  HPI  83 year old patient who complains of headaches intermittently for the past few days.  At 10:00 this morning.  She had a more severe headache associated with dizziness and nausea.  She describes fairly generalized pain involving the neck and ear regions. Pain resolved and she was seen earlier this afternoon by cardiology.  She has a history of dilated cardiomyopathy, aortic stenosis and coronary artery disease. Following her cardiology visit.  She had recurrent headaches associated with nausea.  Headaches are described as quite severe and unusual for the patient She is followed by hospice, DO NOT RESUSCITATE status, and does not desire hospital readmission.  Medicines available at home include the morphine The family was concerned about a UTI due to change in clinical status and history of recurrent UTIs  Past Medical History:  Diagnosis Date  . ANEMIA 03/31/2009  . Chronic kidney disease   . DEGENERATIVE JOINT DISEASE, GENERALIZED 05/06/2007  . Diverticulosis of sigmoid colon    Colonoscopy 2011  . DVT (deep venous thrombosis) (HCC)    hx of  . GERD 04/24/2007  . HYPERTENSION 04/24/2007  . VENOUS STASIS ULCER 07/20/2008     Social History   Social History  . Marital status: Widowed    Spouse name: N/A  . Number of children: N/A  . Years of education: N/A   Occupational History  . Not on file.   Social History Main Topics  . Smoking status: Never Smoker  . Smokeless tobacco: Never Used  . Alcohol use No  . Drug use: No  . Sexual activity: Not on file   Other Topics Concern  . Not on file   Social History Narrative  . No narrative on file    Past Surgical History:  Procedure Laterality Date  . ABDOMINAL HYSTERECTOMY     Completion hysterectomy  . CATARACT EXTRACTION    . CHOLECYSTECTOMY OPEN    . HERNIA REPAIR    . PARTIAL HYSTERECTOMY     Salpingo-oophorectomy as  well  . TOTAL KNEE ARTHROPLASTY     x2    Family History  Problem Relation Age of Onset  . Heart attack Sister     Allergies  Allergen Reactions  . Tramadol Other (See Comments)    Auditory and visual hallucinations  . Codeine Nausea And Vomiting    Violently ill  . Ibuprofen Nausea And Vomiting  . Diltiazem Hcl Other (See Comments)    Not sure about this reaction    Current Outpatient Prescriptions on File Prior to Visit  Medication Sig Dispense Refill  . acetaminophen (TYLENOL) 325 MG tablet Take 2 tablets (650 mg total) by mouth every 4 (four) hours as needed for mild pain or moderate pain. (Patient taking differently: Take 650 mg by mouth at bedtime. ) 30 tablet 0  . aspirin EC 81 MG tablet Take 81 mg by mouth every morning.    . carvedilol (COREG) 6.25 MG tablet Take 1 tablet (6.25 mg total) by mouth 2 (two) times daily with a meal. 60 tablet 6  . clonazePAM (KLONOPIN) 0.5 MG tablet TAKE 1/2 TABLET BY MOUTH 3 TIMES A DAY AS NEEDED FOR ANXIETY 30 tablet 1  . Cyanocobalamin (VITAMIN B-12) 2500 MCG SUBL Place 2,500 mcg under the tongue every morning.     . furosemide (LASIX) 40 MG tablet Take 20 mg by mouth daily.    Marland Kitchen  isosorbide mononitrate (IMDUR) 30 MG 24 hr tablet Take 2 tablets (60 mg total) by mouth daily. 60 tablet 0  . Melatonin 3 MG TABS Take 3 mg by mouth at bedtime.    . Multiple Vitamins-Minerals (PRESERVISION AREDS 2) CAPS Take 1 capsule by mouth daily. 90 capsule 3  . nitroGLYCERIN (NITROSTAT) 0.4 MG SL tablet Place 1 tablet (0.4 mg total) under the tongue every 5 (five) minutes as needed for chest pain. 25 tablet 2  . Polyethyl Glycol-Propyl Glycol (SYSTANE) 0.4-0.3 % SOLN Place 2 drops into both eyes 2 (two) times daily.    . polyethylene glycol powder (GLYCOLAX/MIRALAX) powder MIX 17GM IN LIQUID AND     DRINK TWO TIMES A DAY 3162 g 5  . ranolazine (RANEXA) 500 MG 12 hr tablet Take 1 tablet (500 mg total) by mouth 2 (two) times daily. 60 tablet 6  . sertraline  (ZOLOFT) 25 MG tablet Take 1 tablet (25 mg total) by mouth daily. 90 tablet 1   No current facility-administered medications on file prior to visit.     BP (!) 142/82 (BP Location: Left Arm, Patient Position: Sitting, Cuff Size: Normal)   Pulse 78   Temp 98.4 F (36.9 C) (Oral)   Ht 4\' 11"  (1.499 m)   Wt 102 lb 12.8 oz (46.6 kg)   SpO2 98%   BMI 20.76 kg/m     Review of Systems  Constitutional: Positive for activity change and appetite change.  HENT: Negative for congestion, dental problem, hearing loss, rhinorrhea, sinus pressure, sore throat and tinnitus.   Eyes: Negative for pain, discharge and visual disturbance.  Respiratory: Negative for cough and shortness of breath.   Cardiovascular: Negative for chest pain, palpitations and leg swelling.  Gastrointestinal: Positive for nausea. Negative for abdominal distention, abdominal pain, blood in stool, constipation, diarrhea and vomiting.  Genitourinary: Negative for difficulty urinating, dysuria, flank pain, frequency, hematuria, pelvic pain, urgency, vaginal bleeding, vaginal discharge and vaginal pain.  Musculoskeletal: Positive for neck pain. Negative for arthralgias, gait problem, joint swelling and neck stiffness.  Skin: Negative for rash.  Neurological: Positive for dizziness, light-headedness and headaches. Negative for syncope, speech difficulty, weakness and numbness.  Hematological: Negative for adenopathy.  Psychiatric/Behavioral: Negative for agitation, behavioral problems and dysphoric mood. The patient is not nervous/anxious.        Objective:   Physical Exam  Constitutional: She is oriented to person, place, and time. She appears well-developed and well-nourished.  Wheelchair bound Alert and conversant Afebrile Uncomfortable due to headache  HENT:  Head: Normocephalic.  Right Ear: External ear normal.  Left Ear: External ear normal.  Mouth/Throat: Oropharynx is clear and moist.  Eyes: Conjunctivae and EOM  are normal. Pupils are equal, round, and reactive to light.  Neck: Neck supple. No thyromegaly present.  No meningismus  Cardiovascular: Normal rate, regular rhythm and intact distal pulses.   Murmur heard. Pulmonary/Chest: Effort normal and breath sounds normal.  Abdominal: Soft. Bowel sounds are normal. She exhibits no mass. There is no tenderness.  Musculoskeletal: Normal range of motion.  Lymphadenopathy:    She has no cervical adenopathy.  Neurological: She is alert and oriented to person, place, and time. No cranial nerve deficit.  Generalized weakness but nonfocal  Skin: Skin is warm and dry. No rash noted.  Psychiatric: She has a normal mood and affect. Her behavior is normal.          Assessment & Plan:   Headache associated with nausea and possible dizziness.  Options  discussed including evaluation in the ED setting with head CT.  Patient does not desire aggressive evaluation will treat symptomatically with Phenergan 25 mg IM  The patient does have morphine elixir at home Coronary artery disease UTI.  Patient will be treated with Cipro 500 twice a day for 3 days Essential hypertension, stable  [name],Rodel Glaspy FRANK

## 2016-11-30 NOTE — Patient Instructions (Signed)
Call or return to clinic prn if these symptoms worsen or fail to improve as anticipated.  Take your antibiotic as prescribed until ALL of it is gone, but stop if you develop a rash, swelling, or any side effects of the medication.  Contact our office as soon as possible if  there are side effects of the medication. 

## 2016-12-02 ENCOUNTER — Encounter: Payer: Self-pay | Admitting: Cardiology

## 2016-12-02 DIAGNOSIS — I209 Angina pectoris, unspecified: Secondary | ICD-10-CM | POA: Insufficient documentation

## 2016-12-02 NOTE — Assessment & Plan Note (Signed)
Probably secondary to hypertension weekly. She probably has pretty elevated LVEDP. Plan would be to restart carvedilol for additional blood pressure control which will help her dyspnea symptoms.

## 2016-12-02 NOTE — Assessment & Plan Note (Signed)
He is on a standing dose of Lasix. We discussed sliding scale. No real PND or orthopnea

## 2016-12-02 NOTE — Assessment & Plan Note (Signed)
In the hospital, she had probably class IV angina with just minimal exertion. Now it is somewhat improved on medical management, but definitely was better off with a combination of carvedilol and Ranexa. We did restart her Ranexa to go along with the Imdur. However her blood pressure is extremely high and I would like to restart carvedilol is that probably had a major role in helping her anginal symptoms. Again angina is pain and therefore adequate antianginal treatment is palliative. -- Standard of care treatment for angina would be beta blocker nitrate Ranexa plus or minus calcium channel blocker.

## 2016-12-02 NOTE — Assessment & Plan Note (Signed)
Heart is tell how significant the stenosis is because of the echocardiogram was unhelpful. - At least moderate if not moderate to severe. I doubt it got too much worse from 2017 until now. At this point unless the murmur worsens her symptoms worsen, I would be reluctant to continue to follow. No plans for invasive management. Would simply control blood pressure and symptoms.

## 2016-12-02 NOTE — Assessment & Plan Note (Signed)
Presumably she had an MI in the hospital. Very difficult to tell however because we don't have any way of doing a ischemic evaluation. No reason to a Myoview if would not consider proceed with cardiac catheterization. Plan for now is medical management.  She is on palliative care/hospice care. --I however think that treating her anginal symptoms is palliative care. I therefore would like to restart her carvedilol that she can titrate back up to a full dose after a week (she would start with half a dose twice a day for the first week). She is back on her Ranexa and back on Imdur.  On aspirin alone. No need to start a statin at this point.

## 2016-12-05 ENCOUNTER — Other Ambulatory Visit: Payer: Self-pay | Admitting: Internal Medicine

## 2016-12-05 DIAGNOSIS — I5032 Chronic diastolic (congestive) heart failure: Secondary | ICD-10-CM | POA: Diagnosis not present

## 2016-12-05 DIAGNOSIS — K219 Gastro-esophageal reflux disease without esophagitis: Secondary | ICD-10-CM | POA: Diagnosis not present

## 2016-12-05 DIAGNOSIS — I129 Hypertensive chronic kidney disease with stage 1 through stage 4 chronic kidney disease, or unspecified chronic kidney disease: Secondary | ICD-10-CM | POA: Diagnosis not present

## 2016-12-07 DIAGNOSIS — K219 Gastro-esophageal reflux disease without esophagitis: Secondary | ICD-10-CM | POA: Diagnosis not present

## 2016-12-07 DIAGNOSIS — I5032 Chronic diastolic (congestive) heart failure: Secondary | ICD-10-CM | POA: Diagnosis not present

## 2016-12-07 DIAGNOSIS — I129 Hypertensive chronic kidney disease with stage 1 through stage 4 chronic kidney disease, or unspecified chronic kidney disease: Secondary | ICD-10-CM | POA: Diagnosis not present

## 2016-12-10 DIAGNOSIS — K219 Gastro-esophageal reflux disease without esophagitis: Secondary | ICD-10-CM | POA: Diagnosis not present

## 2016-12-10 DIAGNOSIS — I5032 Chronic diastolic (congestive) heart failure: Secondary | ICD-10-CM | POA: Diagnosis not present

## 2016-12-10 DIAGNOSIS — I129 Hypertensive chronic kidney disease with stage 1 through stage 4 chronic kidney disease, or unspecified chronic kidney disease: Secondary | ICD-10-CM | POA: Diagnosis not present

## 2016-12-11 DIAGNOSIS — I5032 Chronic diastolic (congestive) heart failure: Secondary | ICD-10-CM | POA: Diagnosis not present

## 2016-12-11 DIAGNOSIS — K219 Gastro-esophageal reflux disease without esophagitis: Secondary | ICD-10-CM | POA: Diagnosis not present

## 2016-12-11 DIAGNOSIS — I129 Hypertensive chronic kidney disease with stage 1 through stage 4 chronic kidney disease, or unspecified chronic kidney disease: Secondary | ICD-10-CM | POA: Diagnosis not present

## 2016-12-14 DIAGNOSIS — I5032 Chronic diastolic (congestive) heart failure: Secondary | ICD-10-CM | POA: Diagnosis not present

## 2016-12-14 DIAGNOSIS — I129 Hypertensive chronic kidney disease with stage 1 through stage 4 chronic kidney disease, or unspecified chronic kidney disease: Secondary | ICD-10-CM | POA: Diagnosis not present

## 2016-12-14 DIAGNOSIS — K219 Gastro-esophageal reflux disease without esophagitis: Secondary | ICD-10-CM | POA: Diagnosis not present

## 2016-12-19 DIAGNOSIS — I129 Hypertensive chronic kidney disease with stage 1 through stage 4 chronic kidney disease, or unspecified chronic kidney disease: Secondary | ICD-10-CM | POA: Diagnosis not present

## 2016-12-19 DIAGNOSIS — K219 Gastro-esophageal reflux disease without esophagitis: Secondary | ICD-10-CM | POA: Diagnosis not present

## 2016-12-19 DIAGNOSIS — I5032 Chronic diastolic (congestive) heart failure: Secondary | ICD-10-CM | POA: Diagnosis not present

## 2016-12-23 DIAGNOSIS — I129 Hypertensive chronic kidney disease with stage 1 through stage 4 chronic kidney disease, or unspecified chronic kidney disease: Secondary | ICD-10-CM | POA: Diagnosis not present

## 2016-12-23 DIAGNOSIS — I5032 Chronic diastolic (congestive) heart failure: Secondary | ICD-10-CM | POA: Diagnosis not present

## 2016-12-23 DIAGNOSIS — K219 Gastro-esophageal reflux disease without esophagitis: Secondary | ICD-10-CM | POA: Diagnosis not present

## 2016-12-26 DIAGNOSIS — I5032 Chronic diastolic (congestive) heart failure: Secondary | ICD-10-CM | POA: Diagnosis not present

## 2016-12-26 DIAGNOSIS — K219 Gastro-esophageal reflux disease without esophagitis: Secondary | ICD-10-CM | POA: Diagnosis not present

## 2016-12-26 DIAGNOSIS — I129 Hypertensive chronic kidney disease with stage 1 through stage 4 chronic kidney disease, or unspecified chronic kidney disease: Secondary | ICD-10-CM | POA: Diagnosis not present

## 2017-01-01 IMAGING — DX DG CHEST 2V
2 series · 2 of 2 positions shown · non-contrast
Comparison: 01/01/2016

CLINICAL DATA: SOB and left leg swelling for 3 weeks now, chest
tightness at times

EXAM:
CHEST  2 VIEW

[chest pa]
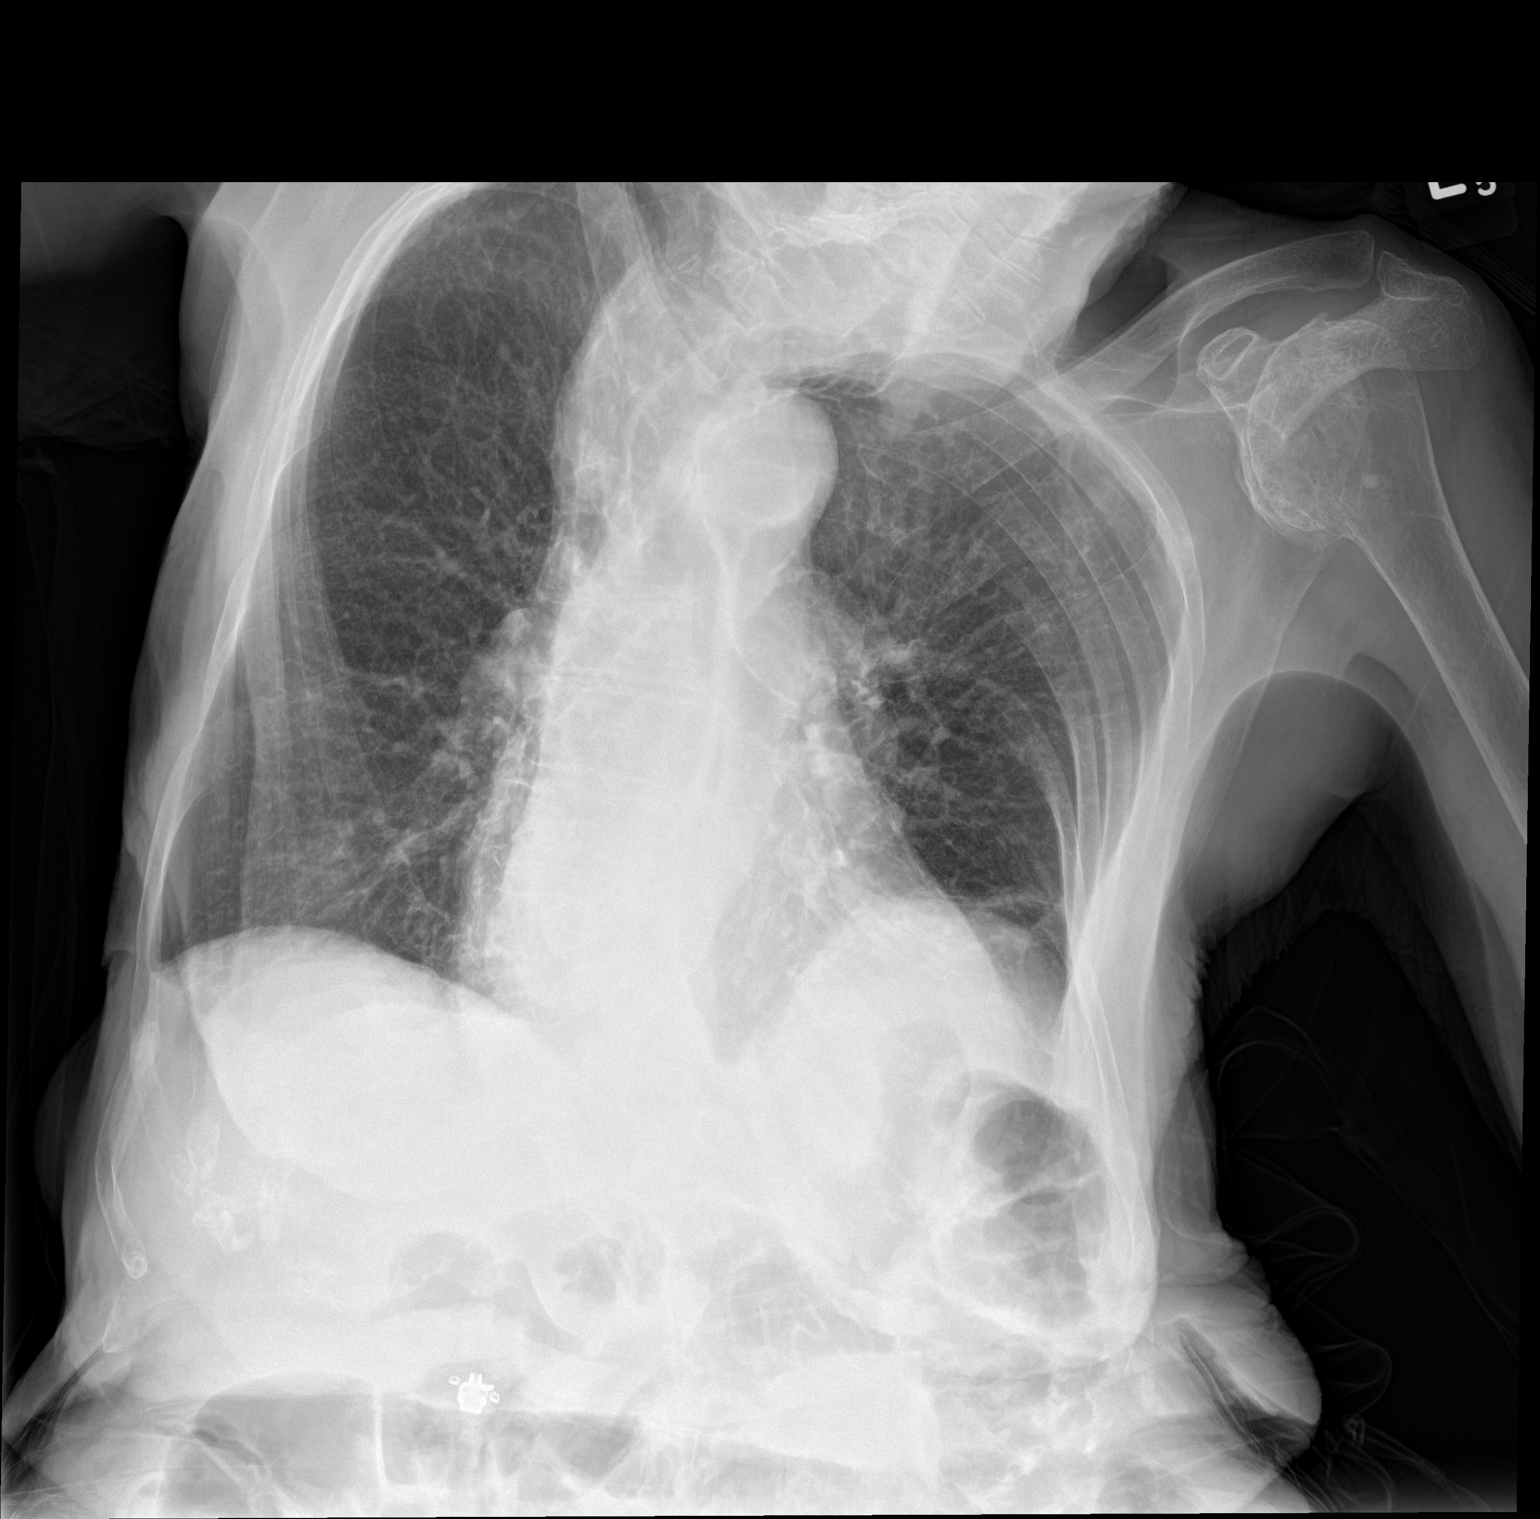

[chest lat]
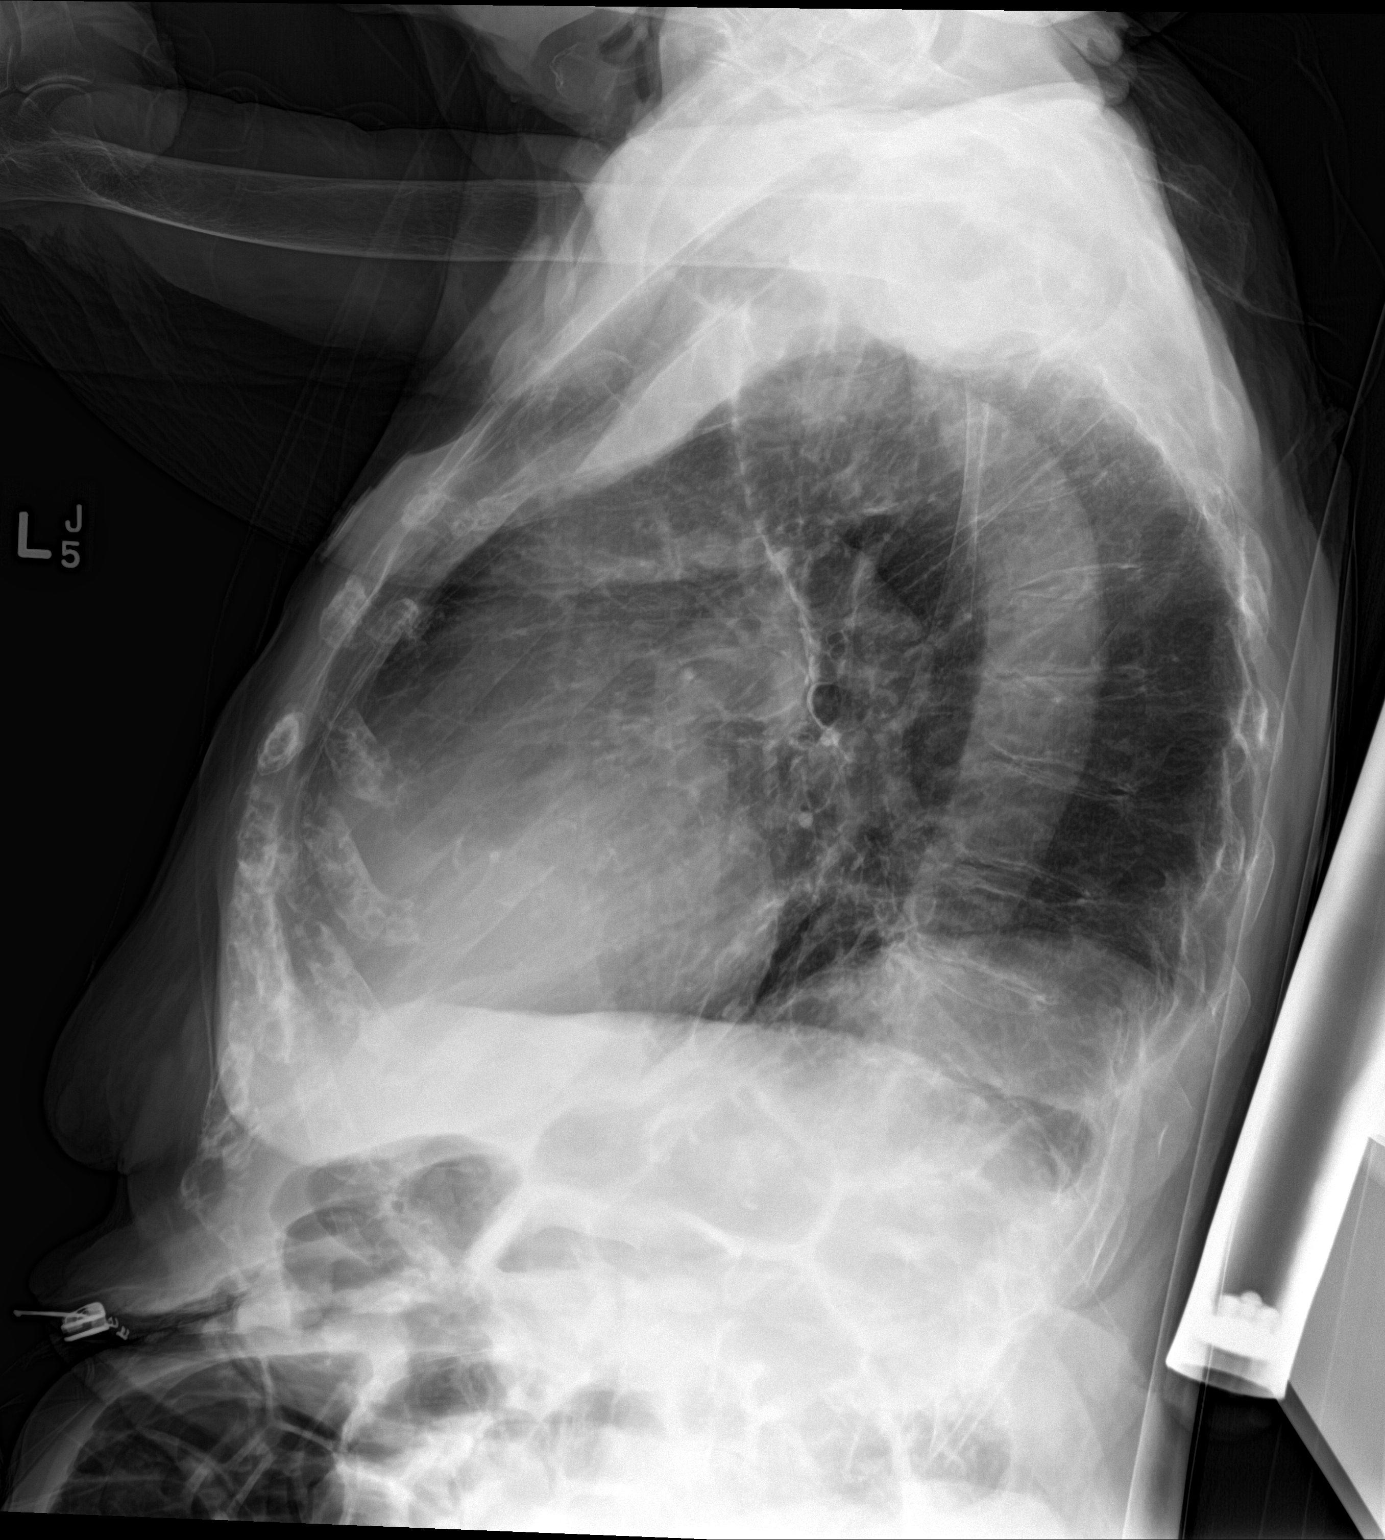

[2 of 2 positions shown; findings below may reference images not displayed]

FINDINGS: Heart size upper normal. Vascular pattern normal. No consolidation
effusion or edema. Severe bilateral shoulder arthropathy.
IMPRESSION: No active cardiopulmonary disease.

## 2017-01-02 DIAGNOSIS — I129 Hypertensive chronic kidney disease with stage 1 through stage 4 chronic kidney disease, or unspecified chronic kidney disease: Secondary | ICD-10-CM | POA: Diagnosis not present

## 2017-01-02 DIAGNOSIS — K219 Gastro-esophageal reflux disease without esophagitis: Secondary | ICD-10-CM | POA: Diagnosis not present

## 2017-01-02 DIAGNOSIS — I5032 Chronic diastolic (congestive) heart failure: Secondary | ICD-10-CM | POA: Diagnosis not present

## 2017-01-04 ENCOUNTER — Other Ambulatory Visit: Payer: Self-pay | Admitting: Internal Medicine

## 2017-01-04 DIAGNOSIS — I5032 Chronic diastolic (congestive) heart failure: Secondary | ICD-10-CM | POA: Diagnosis not present

## 2017-01-04 DIAGNOSIS — K219 Gastro-esophageal reflux disease without esophagitis: Secondary | ICD-10-CM | POA: Diagnosis not present

## 2017-01-04 DIAGNOSIS — I129 Hypertensive chronic kidney disease with stage 1 through stage 4 chronic kidney disease, or unspecified chronic kidney disease: Secondary | ICD-10-CM | POA: Diagnosis not present

## 2017-01-05 DIAGNOSIS — K219 Gastro-esophageal reflux disease without esophagitis: Secondary | ICD-10-CM | POA: Diagnosis not present

## 2017-01-05 DIAGNOSIS — I129 Hypertensive chronic kidney disease with stage 1 through stage 4 chronic kidney disease, or unspecified chronic kidney disease: Secondary | ICD-10-CM | POA: Diagnosis not present

## 2017-01-05 DIAGNOSIS — I5032 Chronic diastolic (congestive) heart failure: Secondary | ICD-10-CM | POA: Diagnosis not present

## 2017-01-06 ENCOUNTER — Other Ambulatory Visit: Payer: Self-pay | Admitting: Internal Medicine

## 2017-01-07 DIAGNOSIS — K219 Gastro-esophageal reflux disease without esophagitis: Secondary | ICD-10-CM | POA: Diagnosis not present

## 2017-01-07 DIAGNOSIS — I129 Hypertensive chronic kidney disease with stage 1 through stage 4 chronic kidney disease, or unspecified chronic kidney disease: Secondary | ICD-10-CM | POA: Diagnosis not present

## 2017-01-07 DIAGNOSIS — I5032 Chronic diastolic (congestive) heart failure: Secondary | ICD-10-CM | POA: Diagnosis not present

## 2017-01-07 NOTE — Telephone Encounter (Signed)
Okay to refill? 

## 2017-01-08 DIAGNOSIS — I5032 Chronic diastolic (congestive) heart failure: Secondary | ICD-10-CM | POA: Diagnosis not present

## 2017-01-08 DIAGNOSIS — I129 Hypertensive chronic kidney disease with stage 1 through stage 4 chronic kidney disease, or unspecified chronic kidney disease: Secondary | ICD-10-CM | POA: Diagnosis not present

## 2017-01-08 DIAGNOSIS — K219 Gastro-esophageal reflux disease without esophagitis: Secondary | ICD-10-CM | POA: Diagnosis not present

## 2017-01-08 NOTE — Telephone Encounter (Signed)
Rx phoned in.   

## 2017-01-09 DIAGNOSIS — I129 Hypertensive chronic kidney disease with stage 1 through stage 4 chronic kidney disease, or unspecified chronic kidney disease: Secondary | ICD-10-CM | POA: Diagnosis not present

## 2017-01-09 DIAGNOSIS — K219 Gastro-esophageal reflux disease without esophagitis: Secondary | ICD-10-CM | POA: Diagnosis not present

## 2017-01-09 DIAGNOSIS — I5032 Chronic diastolic (congestive) heart failure: Secondary | ICD-10-CM | POA: Diagnosis not present

## 2017-01-10 ENCOUNTER — Other Ambulatory Visit: Payer: Self-pay | Admitting: Internal Medicine

## 2017-01-10 DIAGNOSIS — I129 Hypertensive chronic kidney disease with stage 1 through stage 4 chronic kidney disease, or unspecified chronic kidney disease: Secondary | ICD-10-CM | POA: Diagnosis not present

## 2017-01-10 DIAGNOSIS — K219 Gastro-esophageal reflux disease without esophagitis: Secondary | ICD-10-CM | POA: Diagnosis not present

## 2017-01-10 DIAGNOSIS — I5032 Chronic diastolic (congestive) heart failure: Secondary | ICD-10-CM | POA: Diagnosis not present

## 2017-01-14 ENCOUNTER — Telehealth: Payer: Self-pay | Admitting: Internal Medicine

## 2017-01-14 DIAGNOSIS — I129 Hypertensive chronic kidney disease with stage 1 through stage 4 chronic kidney disease, or unspecified chronic kidney disease: Secondary | ICD-10-CM | POA: Diagnosis not present

## 2017-01-14 DIAGNOSIS — I5032 Chronic diastolic (congestive) heart failure: Secondary | ICD-10-CM | POA: Diagnosis not present

## 2017-01-14 DIAGNOSIS — K219 Gastro-esophageal reflux disease without esophagitis: Secondary | ICD-10-CM | POA: Diagnosis not present

## 2017-01-14 NOTE — Telephone Encounter (Signed)
Misty Stanley w/Hospice would like to see if Clonazepam 0.5mg  1/2 tablet 3 times daily as needed for anxietyand want to see Dr. Kirtland Bouchard would be will to increase back to a full tablet due to anxiety.  Due to increase anxiety the past week pt want to stay at home alone and she is aware that that is not possible for her to do so.  Pts daughter agrees to the increase of Rx if MD agrees to do so.

## 2017-01-15 DIAGNOSIS — K219 Gastro-esophageal reflux disease without esophagitis: Secondary | ICD-10-CM | POA: Diagnosis not present

## 2017-01-15 DIAGNOSIS — I129 Hypertensive chronic kidney disease with stage 1 through stage 4 chronic kidney disease, or unspecified chronic kidney disease: Secondary | ICD-10-CM | POA: Diagnosis not present

## 2017-01-15 DIAGNOSIS — I5032 Chronic diastolic (congestive) heart failure: Secondary | ICD-10-CM | POA: Diagnosis not present

## 2017-01-15 MED ORDER — CLONAZEPAM 1 MG PO TABS: 1.0000 mg | ORAL_TABLET | Freq: Three times a day (TID) | ORAL | 1 refills | Status: DC | PRN

## 2017-01-15 NOTE — Telephone Encounter (Signed)
Rx printed and faxed over to Hospice

## 2017-01-15 NOTE — Telephone Encounter (Signed)
Please see message below, please advise 

## 2017-01-15 NOTE — Telephone Encounter (Signed)
Okay with increase in doses

## 2017-01-18 DIAGNOSIS — I129 Hypertensive chronic kidney disease with stage 1 through stage 4 chronic kidney disease, or unspecified chronic kidney disease: Secondary | ICD-10-CM | POA: Diagnosis not present

## 2017-01-18 DIAGNOSIS — K219 Gastro-esophageal reflux disease without esophagitis: Secondary | ICD-10-CM | POA: Diagnosis not present

## 2017-01-18 DIAGNOSIS — I5032 Chronic diastolic (congestive) heart failure: Secondary | ICD-10-CM | POA: Diagnosis not present

## 2017-01-22 DIAGNOSIS — I5032 Chronic diastolic (congestive) heart failure: Secondary | ICD-10-CM | POA: Diagnosis not present

## 2017-01-22 DIAGNOSIS — I129 Hypertensive chronic kidney disease with stage 1 through stage 4 chronic kidney disease, or unspecified chronic kidney disease: Secondary | ICD-10-CM | POA: Diagnosis not present

## 2017-01-22 DIAGNOSIS — K219 Gastro-esophageal reflux disease without esophagitis: Secondary | ICD-10-CM | POA: Diagnosis not present

## 2017-01-23 ENCOUNTER — Telehealth: Payer: Self-pay | Admitting: Internal Medicine

## 2017-01-23 DIAGNOSIS — K219 Gastro-esophageal reflux disease without esophagitis: Secondary | ICD-10-CM | POA: Diagnosis not present

## 2017-01-23 DIAGNOSIS — I129 Hypertensive chronic kidney disease with stage 1 through stage 4 chronic kidney disease, or unspecified chronic kidney disease: Secondary | ICD-10-CM | POA: Diagnosis not present

## 2017-01-23 DIAGNOSIS — I5032 Chronic diastolic (congestive) heart failure: Secondary | ICD-10-CM | POA: Diagnosis not present

## 2017-01-23 NOTE — Telephone Encounter (Addendum)
Erin Gibson is calling pt has med isosorbide mononitrate 30 mg take 2 daily. Pt was in hospital and they decrease to one pill daily. Pt feels better when she takes  2 pills daily if md agrees please call refill into [address]

## 2017-01-24 DIAGNOSIS — I129 Hypertensive chronic kidney disease with stage 1 through stage 4 chronic kidney disease, or unspecified chronic kidney disease: Secondary | ICD-10-CM | POA: Diagnosis not present

## 2017-01-24 DIAGNOSIS — I5032 Chronic diastolic (congestive) heart failure: Secondary | ICD-10-CM | POA: Diagnosis not present

## 2017-01-24 DIAGNOSIS — K219 Gastro-esophageal reflux disease without esophagitis: Secondary | ICD-10-CM | POA: Diagnosis not present

## 2017-01-24 NOTE — Telephone Encounter (Signed)
Please advise 

## 2017-01-25 MED ORDER — ISOSORBIDE MONONITRATE ER 30 MG PO TB24: 60.0000 mg | ORAL_TABLET | Freq: Every day | ORAL | 2 refills | Status: AC

## 2017-01-25 NOTE — Telephone Encounter (Signed)
Continue 1 tablet daily Notify if  patient has any chest pain consistent with angina  We'll reassess at next office visit and consider dose increase at that time

## 2017-01-25 NOTE — Telephone Encounter (Signed)
Called iand informed nurse from hospice that Dr Kirtland BouchardK wants her to  continue 1 tablet daily and to notify if patient has any chest pain consistent with angina. He will reassess at next office visit and consider dose increase at that time

## 2017-01-28 ENCOUNTER — Other Ambulatory Visit: Payer: Self-pay | Admitting: Internal Medicine

## 2017-01-28 DIAGNOSIS — I5032 Chronic diastolic (congestive) heart failure: Secondary | ICD-10-CM | POA: Diagnosis not present

## 2017-01-28 DIAGNOSIS — K219 Gastro-esophageal reflux disease without esophagitis: Secondary | ICD-10-CM | POA: Diagnosis not present

## 2017-01-28 DIAGNOSIS — I129 Hypertensive chronic kidney disease with stage 1 through stage 4 chronic kidney disease, or unspecified chronic kidney disease: Secondary | ICD-10-CM | POA: Diagnosis not present

## 2017-01-29 DIAGNOSIS — K219 Gastro-esophageal reflux disease without esophagitis: Secondary | ICD-10-CM | POA: Diagnosis not present

## 2017-01-29 DIAGNOSIS — I129 Hypertensive chronic kidney disease with stage 1 through stage 4 chronic kidney disease, or unspecified chronic kidney disease: Secondary | ICD-10-CM | POA: Diagnosis not present

## 2017-01-29 DIAGNOSIS — I5032 Chronic diastolic (congestive) heart failure: Secondary | ICD-10-CM | POA: Diagnosis not present

## 2017-01-30 DIAGNOSIS — I5032 Chronic diastolic (congestive) heart failure: Secondary | ICD-10-CM | POA: Diagnosis not present

## 2017-01-30 DIAGNOSIS — K219 Gastro-esophageal reflux disease without esophagitis: Secondary | ICD-10-CM | POA: Diagnosis not present

## 2017-01-30 DIAGNOSIS — I129 Hypertensive chronic kidney disease with stage 1 through stage 4 chronic kidney disease, or unspecified chronic kidney disease: Secondary | ICD-10-CM | POA: Diagnosis not present

## 2017-02-01 ENCOUNTER — Other Ambulatory Visit: Payer: Self-pay | Admitting: Internal Medicine

## 2017-02-01 DIAGNOSIS — I129 Hypertensive chronic kidney disease with stage 1 through stage 4 chronic kidney disease, or unspecified chronic kidney disease: Secondary | ICD-10-CM | POA: Diagnosis not present

## 2017-02-01 DIAGNOSIS — I5032 Chronic diastolic (congestive) heart failure: Secondary | ICD-10-CM | POA: Diagnosis not present

## 2017-02-01 DIAGNOSIS — K219 Gastro-esophageal reflux disease without esophagitis: Secondary | ICD-10-CM | POA: Diagnosis not present

## 2017-02-05 DIAGNOSIS — I129 Hypertensive chronic kidney disease with stage 1 through stage 4 chronic kidney disease, or unspecified chronic kidney disease: Secondary | ICD-10-CM | POA: Diagnosis not present

## 2017-02-05 DIAGNOSIS — I5032 Chronic diastolic (congestive) heart failure: Secondary | ICD-10-CM | POA: Diagnosis not present

## 2017-02-05 DIAGNOSIS — K219 Gastro-esophageal reflux disease without esophagitis: Secondary | ICD-10-CM | POA: Diagnosis not present

## 2017-02-06 DIAGNOSIS — I129 Hypertensive chronic kidney disease with stage 1 through stage 4 chronic kidney disease, or unspecified chronic kidney disease: Secondary | ICD-10-CM | POA: Diagnosis not present

## 2017-02-06 DIAGNOSIS — K219 Gastro-esophageal reflux disease without esophagitis: Secondary | ICD-10-CM | POA: Diagnosis not present

## 2017-02-06 DIAGNOSIS — I5032 Chronic diastolic (congestive) heart failure: Secondary | ICD-10-CM | POA: Diagnosis not present

## 2017-02-08 DIAGNOSIS — I5032 Chronic diastolic (congestive) heart failure: Secondary | ICD-10-CM | POA: Diagnosis not present

## 2017-02-08 DIAGNOSIS — I129 Hypertensive chronic kidney disease with stage 1 through stage 4 chronic kidney disease, or unspecified chronic kidney disease: Secondary | ICD-10-CM | POA: Diagnosis not present

## 2017-02-08 DIAGNOSIS — K219 Gastro-esophageal reflux disease without esophagitis: Secondary | ICD-10-CM | POA: Diagnosis not present

## 2017-02-12 DIAGNOSIS — I5032 Chronic diastolic (congestive) heart failure: Secondary | ICD-10-CM | POA: Diagnosis not present

## 2017-02-12 DIAGNOSIS — I129 Hypertensive chronic kidney disease with stage 1 through stage 4 chronic kidney disease, or unspecified chronic kidney disease: Secondary | ICD-10-CM | POA: Diagnosis not present

## 2017-02-12 DIAGNOSIS — K219 Gastro-esophageal reflux disease without esophagitis: Secondary | ICD-10-CM | POA: Diagnosis not present

## 2017-02-13 DIAGNOSIS — K219 Gastro-esophageal reflux disease without esophagitis: Secondary | ICD-10-CM | POA: Diagnosis not present

## 2017-02-13 DIAGNOSIS — I129 Hypertensive chronic kidney disease with stage 1 through stage 4 chronic kidney disease, or unspecified chronic kidney disease: Secondary | ICD-10-CM | POA: Diagnosis not present

## 2017-02-13 DIAGNOSIS — I5032 Chronic diastolic (congestive) heart failure: Secondary | ICD-10-CM | POA: Diagnosis not present

## 2017-02-14 ENCOUNTER — Telehealth: Payer: Self-pay

## 2017-02-14 DIAGNOSIS — K219 Gastro-esophageal reflux disease without esophagitis: Secondary | ICD-10-CM | POA: Diagnosis not present

## 2017-02-14 DIAGNOSIS — I129 Hypertensive chronic kidney disease with stage 1 through stage 4 chronic kidney disease, or unspecified chronic kidney disease: Secondary | ICD-10-CM | POA: Diagnosis not present

## 2017-02-14 DIAGNOSIS — I5032 Chronic diastolic (congestive) heart failure: Secondary | ICD-10-CM | POA: Diagnosis not present

## 2017-02-14 NOTE — Telephone Encounter (Signed)
LMTCB Per chart pt is already on clonazepam for anxiety.    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 The patient is having anxiety, chest pain, sleeping a lot, and nausea. She has morphine for chest pain and medications for her other symptoms.

## 2017-02-15 ENCOUNTER — Other Ambulatory Visit: Payer: Self-pay

## 2017-02-15 ENCOUNTER — Telehealth: Payer: Self-pay

## 2017-02-15 DIAGNOSIS — I5032 Chronic diastolic (congestive) heart failure: Secondary | ICD-10-CM | POA: Diagnosis not present

## 2017-02-15 DIAGNOSIS — I129 Hypertensive chronic kidney disease with stage 1 through stage 4 chronic kidney disease, or unspecified chronic kidney disease: Secondary | ICD-10-CM | POA: Diagnosis not present

## 2017-02-15 DIAGNOSIS — K219 Gastro-esophageal reflux disease without esophagitis: Secondary | ICD-10-CM | POA: Diagnosis not present

## 2017-02-15 NOTE — Telephone Encounter (Signed)
[facility] to report that pt seems to be transitioning and they would like to have her medication orders changed. They are requesting to administer morphine and ativan q4h instead of prn. Eloisa Northerndvised Suzanne that Dr. Kirtland BouchardK is not in office today but would consult another MD. Spoke with Dr. Clent RidgesFry and he authorized change in med orders.  Spoke with [facility] and gave verbal order for change in medications. She will also likely fax over order for atropine as well. Dr. Clent RidgesFry agrees to sign any additional orders in Dr. Charm RingsK's absence. Nothing further needed at this time.

## 2017-02-15 NOTE — Telephone Encounter (Signed)
[facility] and she states that she has already spoken with Dr. Kirtland BouchardK and he authorized pt to start Ativan instead of clonazepam.  This has been removed from pt's medication list.   Dr. Kirtland BouchardK - Which strength/dose of Ativan was approved, as pt's chart needs to be updated. Thanks!

## 2017-02-16 DIAGNOSIS — K219 Gastro-esophageal reflux disease without esophagitis: Secondary | ICD-10-CM | POA: Diagnosis not present

## 2017-02-16 DIAGNOSIS — I129 Hypertensive chronic kidney disease with stage 1 through stage 4 chronic kidney disease, or unspecified chronic kidney disease: Secondary | ICD-10-CM | POA: Diagnosis not present

## 2017-02-16 DIAGNOSIS — I5032 Chronic diastolic (congestive) heart failure: Secondary | ICD-10-CM | POA: Diagnosis not present

## 2017-02-17 DIAGNOSIS — I5032 Chronic diastolic (congestive) heart failure: Secondary | ICD-10-CM | POA: Diagnosis not present

## 2017-02-17 DIAGNOSIS — K219 Gastro-esophageal reflux disease without esophagitis: Secondary | ICD-10-CM | POA: Diagnosis not present

## 2017-02-17 DIAGNOSIS — I129 Hypertensive chronic kidney disease with stage 1 through stage 4 chronic kidney disease, or unspecified chronic kidney disease: Secondary | ICD-10-CM | POA: Diagnosis not present

## 2017-02-22 DEATH — deceased

## 2017-03-07 ENCOUNTER — Ambulatory Visit: Payer: Medicare Other | Admitting: Cardiology

## 2017-04-22 IMAGING — RF DG ABDOMEN 1V
1 series · 1 of 1 positions shown · non-contrast
Comparison: CT 06/05/2016.

CLINICAL DATA: Nasogastric tube placement for small bowel
obstruction.

EXAM:
ABDOMEN - 1 VIEW

[Series 2: cp_standard · 0.21mm/px · 1 of 1 slices shown]
[im 1/1]
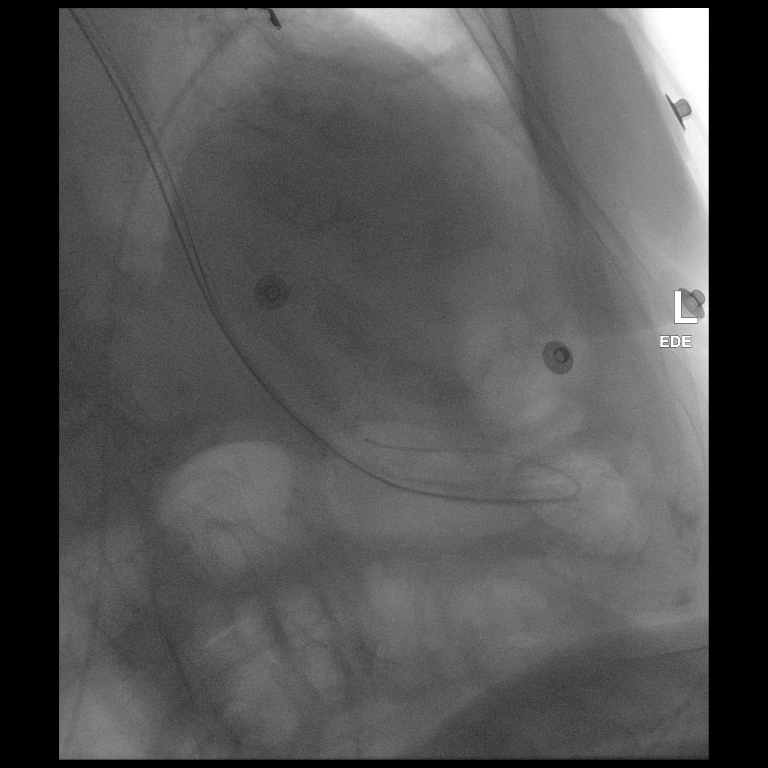

[1 of 1 positions shown; findings below may reference images not displayed]

Fluoroscopy Time:  1 minutes and 54 seconds

Radiation Exposure Index (if provided by the fluoroscopic device):
7.2 mGy

Number of Acquired Spot Images: 0
FINDINGS: Nasogastric tube placement was performed by the radiology
technologist. A 14 French nasogastric tube was placed. 90 ml of
Gastrografin was utilized to aid placement of the nasogastric tube.
A single spot image after tube placement demonstrates the tip of the
tube in the left upper quadrant of the abdomen.
IMPRESSION: Nasogastric tube placement under fluoroscopy.
